# Patient Record
Sex: Female | Born: 1975 | Smoking: Never smoker
Health system: Southern US, Community
[De-identification: ages and names within clinical notes are randomized; demographics above are authoritative.]

## PROBLEM LIST (undated history)

## (undated) ENCOUNTER — Inpatient Hospital Stay (HOSPITAL_COMMUNITY): Payer: Self-pay

## (undated) DIAGNOSIS — Z9889 Other specified postprocedural states: Secondary | ICD-10-CM

## (undated) DIAGNOSIS — T4145XA Adverse effect of unspecified anesthetic, initial encounter: Secondary | ICD-10-CM

## (undated) DIAGNOSIS — F32A Depression, unspecified: Secondary | ICD-10-CM

## (undated) DIAGNOSIS — K921 Melena: Secondary | ICD-10-CM

## (undated) DIAGNOSIS — T8859XA Other complications of anesthesia, initial encounter: Secondary | ICD-10-CM

## (undated) DIAGNOSIS — D352 Benign neoplasm of pituitary gland: Secondary | ICD-10-CM

## (undated) DIAGNOSIS — G43909 Migraine, unspecified, not intractable, without status migrainosus: Secondary | ICD-10-CM

## (undated) DIAGNOSIS — F410 Panic disorder [episodic paroxysmal anxiety] without agoraphobia: Secondary | ICD-10-CM

## (undated) DIAGNOSIS — Z8719 Personal history of other diseases of the digestive system: Secondary | ICD-10-CM

## (undated) DIAGNOSIS — G4730 Sleep apnea, unspecified: Secondary | ICD-10-CM

## (undated) DIAGNOSIS — F329 Major depressive disorder, single episode, unspecified: Secondary | ICD-10-CM

## (undated) DIAGNOSIS — D126 Benign neoplasm of colon, unspecified: Secondary | ICD-10-CM

## (undated) DIAGNOSIS — E162 Hypoglycemia, unspecified: Secondary | ICD-10-CM

## (undated) DIAGNOSIS — K219 Gastro-esophageal reflux disease without esophagitis: Secondary | ICD-10-CM

## (undated) DIAGNOSIS — R112 Nausea with vomiting, unspecified: Secondary | ICD-10-CM

## (undated) DIAGNOSIS — M797 Fibromyalgia: Secondary | ICD-10-CM

## (undated) DIAGNOSIS — M1990 Unspecified osteoarthritis, unspecified site: Secondary | ICD-10-CM

## (undated) DIAGNOSIS — F419 Anxiety disorder, unspecified: Secondary | ICD-10-CM

## (undated) DIAGNOSIS — K589 Irritable bowel syndrome without diarrhea: Secondary | ICD-10-CM

## (undated) DIAGNOSIS — J302 Other seasonal allergic rhinitis: Secondary | ICD-10-CM

## (undated) DIAGNOSIS — M5430 Sciatica, unspecified side: Secondary | ICD-10-CM

## (undated) DIAGNOSIS — K648 Other hemorrhoids: Secondary | ICD-10-CM

## (undated) DIAGNOSIS — O2230 Deep phlebothrombosis in pregnancy, unspecified trimester: Secondary | ICD-10-CM

## (undated) DIAGNOSIS — D649 Anemia, unspecified: Secondary | ICD-10-CM

## (undated) DIAGNOSIS — D499 Neoplasm of unspecified behavior of unspecified site: Secondary | ICD-10-CM

## (undated) HISTORY — DX: Irritable bowel syndrome, unspecified: K58.9

## (undated) HISTORY — DX: Sciatica, unspecified side: M54.30

## (undated) HISTORY — PX: NASAL SINUS SURGERY: SHX719

## (undated) HISTORY — DX: Anxiety disorder, unspecified: F41.9

## (undated) HISTORY — DX: Depression, unspecified: F32.A

## (undated) HISTORY — DX: Melena: K92.1

## (undated) HISTORY — DX: Other seasonal allergic rhinitis: J30.2

## (undated) HISTORY — DX: Other hemorrhoids: K64.8

## (undated) HISTORY — DX: Migraine, unspecified, not intractable, without status migrainosus: G43.909

## (undated) HISTORY — DX: Major depressive disorder, single episode, unspecified: F32.9

## (undated) HISTORY — DX: Benign neoplasm of colon, unspecified: D12.6

## (undated) HISTORY — PX: TEMPOROMANDIBULAR JOINT SURGERY: SHX35

## (undated) HISTORY — DX: Panic disorder (episodic paroxysmal anxiety): F41.0

## (undated) HISTORY — DX: Neoplasm of unspecified behavior of unspecified site: D49.9

## (undated) HISTORY — DX: Gastro-esophageal reflux disease without esophagitis: K21.9

---

## 1997-10-22 DIAGNOSIS — D352 Benign neoplasm of pituitary gland: Secondary | ICD-10-CM

## 1997-10-22 HISTORY — PX: TRANSPHENOIDAL / TRANSNASAL HYPOPHYSECTOMY / RESECTION PITUITARY TUMOR: SUR1382

## 1997-10-22 HISTORY — DX: Benign neoplasm of pituitary gland: D35.2

## 1998-05-03 ENCOUNTER — Other Ambulatory Visit: Admission: RE | Admit: 1998-05-03 | Discharge: 1998-05-03 | Payer: Self-pay | Admitting: Dermatology

## 1998-05-05 ENCOUNTER — Other Ambulatory Visit: Admission: RE | Admit: 1998-05-05 | Discharge: 1998-05-05 | Payer: Self-pay | Admitting: Dermatology

## 1998-05-25 ENCOUNTER — Ambulatory Visit (HOSPITAL_COMMUNITY): Admission: AD | Admit: 1998-05-25 | Discharge: 1998-05-25 | Payer: Self-pay | Admitting: Obstetrics

## 1999-01-13 ENCOUNTER — Ambulatory Visit (HOSPITAL_COMMUNITY): Admission: RE | Admit: 1999-01-13 | Discharge: 1999-01-13 | Payer: Self-pay | Admitting: Anesthesiology

## 1999-02-16 ENCOUNTER — Encounter: Admission: RE | Admit: 1999-02-16 | Discharge: 1999-05-17 | Payer: Self-pay

## 2000-06-20 ENCOUNTER — Encounter: Admission: RE | Admit: 2000-06-20 | Discharge: 2000-06-20 | Payer: Self-pay | Admitting: Gastroenterology

## 2000-06-20 ENCOUNTER — Encounter: Payer: Self-pay | Admitting: Gastroenterology

## 2000-09-09 ENCOUNTER — Ambulatory Visit (HOSPITAL_COMMUNITY): Admission: RE | Admit: 2000-09-09 | Discharge: 2000-09-09 | Payer: Self-pay | Admitting: Neurosurgery

## 2001-03-04 ENCOUNTER — Encounter: Admission: RE | Admit: 2001-03-04 | Discharge: 2001-06-02 | Payer: Self-pay | Admitting: Family Medicine

## 2001-09-21 ENCOUNTER — Emergency Department (HOSPITAL_COMMUNITY): Admission: EM | Admit: 2001-09-21 | Discharge: 2001-09-21 | Payer: Self-pay | Admitting: Emergency Medicine

## 2001-09-21 ENCOUNTER — Encounter: Payer: Self-pay | Admitting: Emergency Medicine

## 2001-10-31 ENCOUNTER — Other Ambulatory Visit: Admission: RE | Admit: 2001-10-31 | Discharge: 2001-10-31 | Payer: Self-pay | Admitting: Obstetrics and Gynecology

## 2002-11-04 ENCOUNTER — Other Ambulatory Visit: Admission: RE | Admit: 2002-11-04 | Discharge: 2002-11-04 | Payer: Self-pay | Admitting: Obstetrics and Gynecology

## 2003-05-31 ENCOUNTER — Encounter: Admission: RE | Admit: 2003-05-31 | Discharge: 2003-08-29 | Payer: Self-pay | Admitting: Family Medicine

## 2004-01-31 ENCOUNTER — Other Ambulatory Visit: Admission: RE | Admit: 2004-01-31 | Discharge: 2004-01-31 | Payer: Self-pay | Admitting: Obstetrics and Gynecology

## 2004-02-25 ENCOUNTER — Ambulatory Visit (HOSPITAL_COMMUNITY): Admission: RE | Admit: 2004-02-25 | Discharge: 2004-02-25 | Payer: Self-pay | Admitting: Endocrinology

## 2004-03-27 ENCOUNTER — Encounter: Admission: RE | Admit: 2004-03-27 | Discharge: 2004-06-25 | Payer: Self-pay | Admitting: Anesthesiology

## 2004-04-04 ENCOUNTER — Encounter: Admission: RE | Admit: 2004-04-04 | Discharge: 2004-07-03 | Payer: Self-pay | Admitting: Family Medicine

## 2004-04-28 ENCOUNTER — Ambulatory Visit (HOSPITAL_COMMUNITY): Admission: RE | Admit: 2004-04-28 | Discharge: 2004-04-28 | Payer: Self-pay | Admitting: Urology

## 2004-04-28 ENCOUNTER — Ambulatory Visit (HOSPITAL_BASED_OUTPATIENT_CLINIC_OR_DEPARTMENT_OTHER): Admission: RE | Admit: 2004-04-28 | Discharge: 2004-04-28 | Payer: Self-pay | Admitting: Urology

## 2004-08-15 ENCOUNTER — Encounter: Admission: RE | Admit: 2004-08-15 | Discharge: 2004-10-25 | Payer: Self-pay | Admitting: Anesthesiology

## 2004-08-17 ENCOUNTER — Ambulatory Visit: Payer: Self-pay | Admitting: Physical Medicine & Rehabilitation

## 2004-09-18 ENCOUNTER — Ambulatory Visit: Payer: Self-pay

## 2004-09-29 ENCOUNTER — Ambulatory Visit: Payer: Self-pay | Admitting: Gastroenterology

## 2004-09-29 DIAGNOSIS — K449 Diaphragmatic hernia without obstruction or gangrene: Secondary | ICD-10-CM | POA: Insufficient documentation

## 2004-09-29 HISTORY — PX: ESOPHAGOGASTRODUODENOSCOPY: SHX1529

## 2004-10-25 ENCOUNTER — Encounter
Admission: RE | Admit: 2004-10-25 | Discharge: 2005-01-23 | Payer: Self-pay | Admitting: Physical Medicine & Rehabilitation

## 2004-10-27 ENCOUNTER — Ambulatory Visit: Payer: Self-pay | Admitting: Physical Medicine & Rehabilitation

## 2005-01-31 ENCOUNTER — Other Ambulatory Visit: Admission: RE | Admit: 2005-01-31 | Discharge: 2005-01-31 | Payer: Self-pay | Admitting: Obstetrics and Gynecology

## 2005-02-01 ENCOUNTER — Encounter: Admission: RE | Admit: 2005-02-01 | Discharge: 2005-04-27 | Payer: Self-pay | Admitting: Certified Nurse Midwife

## 2005-02-27 ENCOUNTER — Encounter
Admission: RE | Admit: 2005-02-27 | Discharge: 2005-05-28 | Payer: Self-pay | Admitting: Physical Medicine & Rehabilitation

## 2005-03-01 ENCOUNTER — Ambulatory Visit: Payer: Self-pay | Admitting: Physical Medicine & Rehabilitation

## 2005-05-24 ENCOUNTER — Ambulatory Visit: Payer: Self-pay | Admitting: Physical Medicine & Rehabilitation

## 2005-05-31 ENCOUNTER — Ambulatory Visit: Payer: Self-pay | Admitting: Physical Medicine & Rehabilitation

## 2005-05-31 ENCOUNTER — Encounter
Admission: RE | Admit: 2005-05-31 | Discharge: 2005-08-29 | Payer: Self-pay | Admitting: Physical Medicine & Rehabilitation

## 2005-07-01 ENCOUNTER — Ambulatory Visit (HOSPITAL_BASED_OUTPATIENT_CLINIC_OR_DEPARTMENT_OTHER)
Admission: RE | Admit: 2005-07-01 | Discharge: 2005-07-01 | Payer: Self-pay | Admitting: Physical Medicine & Rehabilitation

## 2005-07-01 ENCOUNTER — Ambulatory Visit: Payer: Self-pay | Admitting: Internal Medicine

## 2005-07-12 ENCOUNTER — Ambulatory Visit: Payer: Self-pay | Admitting: Physical Medicine & Rehabilitation

## 2005-08-31 ENCOUNTER — Encounter
Admission: RE | Admit: 2005-08-31 | Discharge: 2005-11-29 | Payer: Self-pay | Admitting: Physical Medicine & Rehabilitation

## 2005-08-31 ENCOUNTER — Ambulatory Visit: Payer: Self-pay | Admitting: Physical Medicine & Rehabilitation

## 2005-10-11 ENCOUNTER — Ambulatory Visit: Payer: Self-pay | Admitting: Physical Medicine & Rehabilitation

## 2005-11-13 ENCOUNTER — Ambulatory Visit: Payer: Self-pay | Admitting: Physical Medicine & Rehabilitation

## 2005-11-18 ENCOUNTER — Encounter: Admission: RE | Admit: 2005-11-18 | Discharge: 2005-11-18 | Payer: Self-pay | Admitting: Family Medicine

## 2005-12-18 ENCOUNTER — Ambulatory Visit: Payer: Self-pay | Admitting: Physical Medicine & Rehabilitation

## 2005-12-18 ENCOUNTER — Encounter
Admission: RE | Admit: 2005-12-18 | Discharge: 2006-03-18 | Payer: Self-pay | Admitting: Physical Medicine & Rehabilitation

## 2006-02-06 ENCOUNTER — Other Ambulatory Visit: Admission: RE | Admit: 2006-02-06 | Discharge: 2006-02-06 | Payer: Self-pay | Admitting: Obstetrics and Gynecology

## 2006-02-12 ENCOUNTER — Ambulatory Visit: Payer: Self-pay | Admitting: Physical Medicine & Rehabilitation

## 2006-04-12 ENCOUNTER — Encounter
Admission: RE | Admit: 2006-04-12 | Discharge: 2006-07-11 | Payer: Self-pay | Admitting: Physical Medicine & Rehabilitation

## 2006-05-17 ENCOUNTER — Ambulatory Visit: Payer: Self-pay | Admitting: Physical Medicine & Rehabilitation

## 2006-05-24 ENCOUNTER — Encounter: Admission: RE | Admit: 2006-05-24 | Discharge: 2006-05-24 | Payer: Self-pay | Admitting: Family Medicine

## 2006-07-18 ENCOUNTER — Ambulatory Visit: Payer: Self-pay | Admitting: Physical Medicine & Rehabilitation

## 2006-07-18 ENCOUNTER — Encounter
Admission: RE | Admit: 2006-07-18 | Discharge: 2006-10-16 | Payer: Self-pay | Admitting: Physical Medicine & Rehabilitation

## 2006-07-22 ENCOUNTER — Encounter: Admission: RE | Admit: 2006-07-22 | Discharge: 2006-07-22 | Payer: Self-pay | Admitting: Family Medicine

## 2006-09-09 ENCOUNTER — Ambulatory Visit: Payer: Self-pay | Admitting: Physical Medicine & Rehabilitation

## 2006-10-22 DIAGNOSIS — D126 Benign neoplasm of colon, unspecified: Secondary | ICD-10-CM

## 2006-10-22 HISTORY — PX: UTERINE FIBROID SURGERY: SHX826

## 2006-10-22 HISTORY — DX: Benign neoplasm of colon, unspecified: D12.6

## 2006-11-05 ENCOUNTER — Ambulatory Visit: Payer: Self-pay | Admitting: Physical Medicine & Rehabilitation

## 2006-11-05 ENCOUNTER — Encounter
Admission: RE | Admit: 2006-11-05 | Discharge: 2007-02-03 | Payer: Self-pay | Admitting: Physical Medicine & Rehabilitation

## 2006-12-18 ENCOUNTER — Ambulatory Visit: Payer: Self-pay | Admitting: Internal Medicine

## 2006-12-18 LAB — CONVERTED CEMR LAB
ALT: 21 units/L (ref 0–40)
AST: 22 units/L (ref 0–37)
Albumin: 4 g/dL (ref 3.5–5.2)
Alkaline Phosphatase: 30 units/L — ABNORMAL LOW (ref 39–117)
Basophils Relative: 0.5 % (ref 0.0–1.0)
CO2: 28 meq/L (ref 19–32)
Calcium: 9.3 mg/dL (ref 8.4–10.5)
Creatinine, Ser: 0.6 mg/dL (ref 0.4–1.2)
Eosinophils Absolute: 0.2 10*3/uL (ref 0.0–0.6)
Ferritin: 11.8 ng/mL (ref 10.0–291.0)
GFR calc non Af Amer: 124 mL/min
Glucose, Bld: 89 mg/dL (ref 70–99)
Hemoglobin: 13.1 g/dL (ref 12.0–15.0)
IgA: 129 mg/dL (ref 68–378)
Neutrophils Relative %: 56.4 % (ref 43.0–77.0)
Platelets: 294 10*3/uL (ref 150–400)
RBC: 4.35 M/uL (ref 3.87–5.11)
Sodium: 141 meq/L (ref 135–145)
Total Bilirubin: 1 mg/dL (ref 0.3–1.2)
Total Protein: 7.3 g/dL (ref 6.0–8.3)
Vitamin B-12: 747 pg/mL (ref 211–911)

## 2007-01-03 ENCOUNTER — Encounter (INDEPENDENT_AMBULATORY_CARE_PROVIDER_SITE_OTHER): Payer: Self-pay | Admitting: Specialist

## 2007-01-03 ENCOUNTER — Ambulatory Visit: Payer: Self-pay | Admitting: Internal Medicine

## 2007-01-03 DIAGNOSIS — K648 Other hemorrhoids: Secondary | ICD-10-CM | POA: Insufficient documentation

## 2007-01-03 DIAGNOSIS — D499 Neoplasm of unspecified behavior of unspecified site: Secondary | ICD-10-CM

## 2007-01-03 HISTORY — DX: Neoplasm of unspecified behavior of unspecified site: D49.9

## 2007-01-03 HISTORY — PX: COLONOSCOPY: SHX174

## 2007-01-08 ENCOUNTER — Ambulatory Visit (HOSPITAL_COMMUNITY): Admission: RE | Admit: 2007-01-08 | Discharge: 2007-01-08 | Payer: Self-pay | Admitting: Obstetrics and Gynecology

## 2007-01-21 ENCOUNTER — Ambulatory Visit: Payer: Self-pay | Admitting: Internal Medicine

## 2007-01-24 ENCOUNTER — Ambulatory Visit: Payer: Self-pay | Admitting: Physical Medicine & Rehabilitation

## 2007-02-26 ENCOUNTER — Emergency Department (HOSPITAL_COMMUNITY): Admission: EM | Admit: 2007-02-26 | Discharge: 2007-02-26 | Payer: Self-pay | Admitting: Emergency Medicine

## 2007-04-15 ENCOUNTER — Encounter: Payer: Self-pay | Admitting: Internal Medicine

## 2007-04-22 ENCOUNTER — Encounter
Admission: RE | Admit: 2007-04-22 | Discharge: 2007-07-21 | Payer: Self-pay | Admitting: Physical Medicine & Rehabilitation

## 2007-04-29 ENCOUNTER — Ambulatory Visit: Payer: Self-pay | Admitting: Physical Medicine & Rehabilitation

## 2007-06-06 ENCOUNTER — Ambulatory Visit: Payer: Self-pay | Admitting: Physical Medicine & Rehabilitation

## 2007-09-05 ENCOUNTER — Encounter
Admission: RE | Admit: 2007-09-05 | Discharge: 2007-09-05 | Payer: Self-pay | Admitting: Physical Medicine & Rehabilitation

## 2007-09-05 ENCOUNTER — Ambulatory Visit: Payer: Self-pay | Admitting: Physical Medicine & Rehabilitation

## 2007-10-07 ENCOUNTER — Ambulatory Visit: Payer: Self-pay | Admitting: Physical Medicine & Rehabilitation

## 2007-11-06 ENCOUNTER — Encounter
Admission: RE | Admit: 2007-11-06 | Discharge: 2007-11-07 | Payer: Self-pay | Admitting: Physical Medicine & Rehabilitation

## 2008-05-05 ENCOUNTER — Encounter: Payer: Self-pay | Admitting: Internal Medicine

## 2008-05-20 ENCOUNTER — Inpatient Hospital Stay (HOSPITAL_COMMUNITY): Admission: AD | Admit: 2008-05-20 | Discharge: 2008-05-22 | Payer: Self-pay | Admitting: Obstetrics and Gynecology

## 2008-05-21 ENCOUNTER — Encounter: Payer: Self-pay | Admitting: Obstetrics and Gynecology

## 2008-07-17 ENCOUNTER — Inpatient Hospital Stay (HOSPITAL_COMMUNITY): Admission: AD | Admit: 2008-07-17 | Discharge: 2008-07-17 | Payer: Self-pay | Admitting: Obstetrics and Gynecology

## 2008-07-25 ENCOUNTER — Inpatient Hospital Stay (HOSPITAL_COMMUNITY): Admission: AD | Admit: 2008-07-25 | Discharge: 2008-07-28 | Payer: Self-pay | Admitting: Obstetrics and Gynecology

## 2008-09-20 ENCOUNTER — Encounter
Admission: RE | Admit: 2008-09-20 | Discharge: 2008-09-21 | Payer: Self-pay | Admitting: Physical Medicine & Rehabilitation

## 2008-09-21 ENCOUNTER — Ambulatory Visit: Payer: Self-pay | Admitting: Physical Medicine & Rehabilitation

## 2008-11-08 ENCOUNTER — Telehealth: Payer: Self-pay | Admitting: Internal Medicine

## 2008-12-13 ENCOUNTER — Telehealth: Payer: Self-pay | Admitting: Internal Medicine

## 2008-12-14 ENCOUNTER — Ambulatory Visit: Payer: Self-pay | Admitting: Internal Medicine

## 2008-12-14 ENCOUNTER — Encounter: Payer: Self-pay | Admitting: Physician Assistant

## 2008-12-14 DIAGNOSIS — Z860100 Personal history of colon polyps, unspecified: Secondary | ICD-10-CM | POA: Insufficient documentation

## 2008-12-14 LAB — CONVERTED CEMR LAB
Basophils Absolute: 0 10*3/uL (ref 0.0–0.1)
Basophils Relative: 0.7 % (ref 0.0–3.0)
Eosinophils Absolute: 0.7 10*3/uL (ref 0.0–0.7)
HCT: 38.7 % (ref 36.0–46.0)
Lymphocytes Relative: 28.3 % (ref 12.0–46.0)
Monocytes Absolute: 0.5 10*3/uL (ref 0.1–1.0)
Platelets: 289 10*3/uL (ref 150–400)
WBC: 5.6 10*3/uL (ref 4.5–10.5)

## 2008-12-17 ENCOUNTER — Encounter
Admission: RE | Admit: 2008-12-17 | Discharge: 2009-01-03 | Payer: Self-pay | Admitting: Physical Medicine & Rehabilitation

## 2008-12-22 ENCOUNTER — Telehealth: Payer: Self-pay | Admitting: Physician Assistant

## 2009-01-03 ENCOUNTER — Ambulatory Visit: Payer: Self-pay | Admitting: Physical Medicine & Rehabilitation

## 2009-01-19 ENCOUNTER — Encounter: Payer: Self-pay | Admitting: Internal Medicine

## 2009-01-27 ENCOUNTER — Telehealth: Payer: Self-pay | Admitting: Internal Medicine

## 2009-01-31 ENCOUNTER — Ambulatory Visit: Payer: Self-pay | Admitting: Internal Medicine

## 2009-01-31 ENCOUNTER — Telehealth: Payer: Self-pay | Admitting: Physician Assistant

## 2009-01-31 DIAGNOSIS — K219 Gastro-esophageal reflux disease without esophagitis: Secondary | ICD-10-CM

## 2009-01-31 DIAGNOSIS — R1013 Epigastric pain: Secondary | ICD-10-CM

## 2009-02-02 ENCOUNTER — Telehealth: Payer: Self-pay | Admitting: Internal Medicine

## 2009-02-02 ENCOUNTER — Telehealth: Payer: Self-pay | Admitting: Physician Assistant

## 2009-02-03 ENCOUNTER — Telehealth: Payer: Self-pay | Admitting: Internal Medicine

## 2009-02-07 ENCOUNTER — Telehealth: Payer: Self-pay | Admitting: Internal Medicine

## 2009-02-07 ENCOUNTER — Encounter: Admission: RE | Admit: 2009-02-07 | Discharge: 2009-02-07 | Payer: Self-pay | Admitting: Internal Medicine

## 2009-02-08 ENCOUNTER — Ambulatory Visit: Payer: Self-pay | Admitting: Internal Medicine

## 2009-02-09 ENCOUNTER — Telehealth: Payer: Self-pay | Admitting: Internal Medicine

## 2009-02-14 ENCOUNTER — Telehealth: Payer: Self-pay | Admitting: Physician Assistant

## 2009-08-05 ENCOUNTER — Inpatient Hospital Stay (HOSPITAL_COMMUNITY): Admission: AD | Admit: 2009-08-05 | Discharge: 2009-08-05 | Payer: Self-pay | Admitting: Obstetrics and Gynecology

## 2009-08-05 ENCOUNTER — Ambulatory Visit: Payer: Self-pay | Admitting: Obstetrics and Gynecology

## 2009-08-08 ENCOUNTER — Telehealth: Payer: Self-pay | Admitting: Internal Medicine

## 2009-08-09 ENCOUNTER — Telehealth (INDEPENDENT_AMBULATORY_CARE_PROVIDER_SITE_OTHER): Payer: Self-pay | Admitting: *Deleted

## 2009-10-02 ENCOUNTER — Inpatient Hospital Stay (HOSPITAL_COMMUNITY): Admission: AD | Admit: 2009-10-02 | Discharge: 2009-10-04 | Payer: Self-pay | Admitting: Obstetrics and Gynecology

## 2009-11-14 ENCOUNTER — Telehealth: Payer: Self-pay | Admitting: Internal Medicine

## 2009-11-30 ENCOUNTER — Ambulatory Visit: Payer: Self-pay | Admitting: Internal Medicine

## 2009-11-30 DIAGNOSIS — R932 Abnormal findings on diagnostic imaging of liver and biliary tract: Secondary | ICD-10-CM

## 2009-12-04 DIAGNOSIS — K589 Irritable bowel syndrome without diarrhea: Secondary | ICD-10-CM

## 2009-12-08 ENCOUNTER — Encounter
Admission: RE | Admit: 2009-12-08 | Discharge: 2009-12-13 | Payer: Self-pay | Admitting: Physical Medicine & Rehabilitation

## 2009-12-13 ENCOUNTER — Ambulatory Visit: Payer: Self-pay | Admitting: Physical Medicine & Rehabilitation

## 2009-12-16 ENCOUNTER — Encounter: Admission: RE | Admit: 2009-12-16 | Discharge: 2009-12-16 | Payer: Self-pay | Admitting: Internal Medicine

## 2010-01-12 ENCOUNTER — Telehealth: Payer: Self-pay | Admitting: Internal Medicine

## 2010-02-02 ENCOUNTER — Ambulatory Visit: Payer: Self-pay | Admitting: Internal Medicine

## 2010-02-22 ENCOUNTER — Encounter: Payer: Self-pay | Admitting: Internal Medicine

## 2010-03-31 ENCOUNTER — Emergency Department (HOSPITAL_COMMUNITY): Admission: EM | Admit: 2010-03-31 | Discharge: 2010-03-31 | Payer: Self-pay | Admitting: Emergency Medicine

## 2010-03-31 ENCOUNTER — Encounter: Payer: Self-pay | Admitting: Internal Medicine

## 2010-03-31 ENCOUNTER — Telehealth: Payer: Self-pay | Admitting: Internal Medicine

## 2010-05-02 ENCOUNTER — Ambulatory Visit: Payer: Self-pay | Admitting: Internal Medicine

## 2010-05-02 DIAGNOSIS — R198 Other specified symptoms and signs involving the digestive system and abdomen: Secondary | ICD-10-CM

## 2010-05-03 ENCOUNTER — Encounter: Payer: Self-pay | Admitting: Internal Medicine

## 2010-05-04 DIAGNOSIS — Z91012 Allergy to eggs: Secondary | ICD-10-CM

## 2010-06-19 ENCOUNTER — Encounter
Admission: RE | Admit: 2010-06-19 | Discharge: 2010-09-17 | Payer: Self-pay | Source: Home / Self Care | Admitting: Physical Medicine & Rehabilitation

## 2010-08-23 ENCOUNTER — Ambulatory Visit: Payer: Self-pay | Admitting: Physical Medicine & Rehabilitation

## 2010-11-12 ENCOUNTER — Encounter: Payer: Self-pay | Admitting: Internal Medicine

## 2010-11-21 NOTE — Progress Notes (Signed)
Summary: triage   Phone Note Call from Patient Call back at Home Phone [phone]   Caller: Patient Call For: Dr. Leone Payor Reason for Call: Talk to Nurse Summary of Call: pt doesnt think her March appt is soon enough and would like to see Amy or Gunnar Fusi asap Initial call taken by: Vallarie Mare,  November 14, 2009 3:27 PM  Follow-up for Phone Call        patient with abdominal pain and cramping.  Patient  has recently had a baby delivered last week.  Patient  has antispasmodics Robinol previously perscribed.  Patient  is breast feeding, she is advised she needs to speak with her GYN or baby's perdiatrician for ok to take while breast feeding.  REV rescheduled with Dr Leone Payor to 11-30-09 2:00 Follow-up by: Darcey Nora RN, CGRN,  November 14, 2009 4:01 PM

## 2010-11-21 NOTE — Progress Notes (Signed)
Summary: [place] results  Phone Note Call from Patient Call back at Home Phone [phone]   Caller: Patient Call For: Dr Leone Payor Reason for Call: Lab or Test Results Details for Reason: [place] results Summary of Call: Pt asked about [place] results during procedure reminder call; request a callback; thanks Initial call taken by: Dwan Bolt,  February 07, 2009 1:01 PM  Follow-up for Phone Call        [facility] to pt and advised we do not have results yet.  Advised pt that Dr. Leone Payor would review them with her tomorrow at her procedure.  Pt voices understanding. Follow-up by: Francee Piccolo CMA,  February 07, 2009 1:56 PM

## 2010-11-21 NOTE — Consult Note (Signed)
Summary: [facility]   Imported By: Sherian Rein 12/07/2009 07:14:31  _____________________________________________________________________  External Attachment:    Type:   Image     Comment:   External Document

## 2010-11-21 NOTE — Assessment & Plan Note (Signed)
Summary: IBS SYMPTOMS/CRAMPING...AS.    History of Present Illness Visit Type: Follow-up Visit Primary GI MD: Stan Head MD [facility] Primary Provider: n/a Requesting Provider: n/a Chief Complaint: abdominal pain History of Present Illness:   35 yo white woman with IBS and GERD symptoms. Last seen 01/2009, EGD - gastrtitis and [place] ? of Gallbladder polyps.  She is having similar symptoms as she did after first child. Then she had flu shot and ?ed if that was related as she had pain with sharp cramps in epigastrium lasting for hrs. Typically with eggs.  Took months before she eat eggs again. Does have some radiation into the back. Now similar issues. Other foods that will do itare greasy, fried foods and sometimes sweets. Has tried avoid gluten with mixed results,  Bowels still alternating. Someties has loose stools after or during attacks           Current Medications (verified): 1)  Analpram-Hc 1-2.5 % Crea (Hydrocortisone Ace-Pramoxine) .... Use Rectally As Needed 2)  Multivitamins   Tabs (Multiple Vitamin) .Marland Kitchen.. 1 Once Daily 3)  Align  Caps (Misc Intestinal Flora Regulat) .Marland Kitchen.. 1 Once Daily 4)  Protonix 40 Mg  Tbec (Pantoprazole Sodium) .Marland Kitchen.. 1 By Mouth Two Times A Day 5)  Calcium 500 Mg Tabs (Calcium) .... Once Daily 6)  Vitamin D3 1000 Unit Tabs (Cholecalciferol) .... Once Daily  Allergies (verified): No Known Drug Allergies  Past History:  Past Medical History: Irritable Bowel Syndrome GERD  Depression. Chronic back pain with sciatica.  Anxiety and panic disorder, 1999 and 2000.  Uterine Fibroid(s) Rectal adenoma (5mm) 2008  Past Surgical History: Myoectomy 2008 Benign pituitary tumor resection 1999  Family History: Reviewed history from 12/14/2008 and no changes required. Family History of Prostate Cancer:Father Thyroid Cancer: Sister Family History of Diabetes: Paternal & Maternal Family History of Heart Disease:   Social History: Occupation:  Housewife Single, 2 children Patient has never smoked.  Alcohol Use - no Illicit Drug Use - no Patient gets regular exercise.  Review of Systems       breast feeding  Vital Signs:  Patient profile:   35 year old female Height:      66 inches Weight:      149 pounds BMI:     24.14 Pulse rate:   84 / minute Pulse rhythm:   regular BP sitting:   102 / 60  (left arm) Cuff size:   regular  Vitals Entered By: June McMurray CMA Duncan Dull) (November 30, 2009 2:03 PM)  Physical Exam  General:  Well developed, well nourished, no acute distress. Eyes:  no icterus. Lungs:  Clear throughout to auscultation. Heart:  Regular rate and rhythm; no murmurs, rubs,  or bruits. Abdomen:  Soft, nontender and nondistended. No masses, hepatosplenomegaly or hernias noted. Normal bowel sounds. Psych:  Alert and cooperative. Normal mood and affect.   Impression & Recommendations:  Problem # 1:  ABDOMINAL PAIN-EPIGASTRIC (ICD-789.06) Assessment Deteriorated Recurrent issue for her. Maybe she does have gallstones given the intermittent nature and triggered by food. Frequently caused by eggs but not always. ? of food allergies was raised but this did resolve at one point and was able to eat eggs, etc so doubt allergies doubt peptic, no heartburn on PPI  Orders: MRI Liver (MRI Liver) This will be more accurate way to examine possible gallbladder abnormalities seen at [place], and look for stones.  Problem # 2:  NONSPECIFIC ABN FINDNG RAD&OTH EXAM BILARY TRCT (ICD-793.3) Assessment: Unchanged ? gallbladder polyps  but sxs raise ? of gallstones and ? hemangioma on prior CT and [place]  Orders: MRI Liver (MRI Liver)  Problem # 3:  IRRITABLE BOWEL SYNDROME (ICD-564.1) Assessment: Unchanged Her symptoms could be part of this also.   Patient Instructions: 1)  Your MRI is scheduled at [facility] for Sunday, 12/04/09.  See seperate instructions. 2)  We will call you with follow up after we have these  results. 3)  The medication list was reviewed and reconciled.  All changed / newly prescribed medications were explained.  A complete medication list was provided to the patient / caregiver.

## 2010-11-21 NOTE — Progress Notes (Signed)
Summary: abd pain   Phone Note Call from Patient Call back at Home Phone [phone]   Caller: Patient Call For: Dr. Leone Payor Reason for Call: Talk to Nurse Summary of Call: having attack of upper abd pain/cramping, diarrhea, and extreme nausea Initial call taken by: Vallarie Mare,  March 31, 2010 2:22 PM  Follow-up for Phone Call        Ate salad bar and seasoned potatoes  at [employer] about an hr. ago and within a half hr. began having epigastric pain,nausea and loose brown stool x3.No shaking chills.Is breastfeeding and said baby is 24 months old so she will give her a bottle if necessary but has no meds. on hand. Follow-up by: Teryl Lucy RN,  March 31, 2010 2:45 PM  Additional Follow-up for Phone Call Additional follow up Details #1::        pt called back again and is in alot of pain. Needs to know what she needs to do or she is going to go to ED Additional Follow-up by: Karna Christmas,  March 31, 2010 4:27 PM    Additional Follow-up for Phone Call Additional follow up Details #2::    Discussed pt's symptoms with Dr.Gessner. per his recomendation he does not feel evaluation not appropriate in office at this time.Pt. will have to use her discretion as to ER.evaluation.Pt. became argumentative saying she wanted a med . ordered to keep on hand for her well-documented IBS. and that she is going to come to office immediately for diagnosis and tx.by DrGessner. Given ROV appt. in July per her request.I re-iterated DrGessner's recommendations. Follow-up by: Teryl Lucy RN,  March 31, 2010 4:57 PM  Additional Follow-up for Phone Call Additional follow up Details #3:: Details for Additional Follow-up Action Taken: as per above I disussed with Elnita Maxwell and made above recommendations she has declined to take other IBS medications in the past due to breast-feeding concerns and I was unable to make a rapid decison to prescribe one. we will recontact her re: she can ask her OB/GYN or pediatrician  as to what anti-spasmodic medications are safe in breast feeding. She has not taken recommended medications in the past due to concerns about breast-feeding.Examples of possible anti-spasmodics would be dicyclomine, hyoscyamine. Additional Follow-up by: Iva Boop MD, [name],  March 31, 2010 5:10 PM   Appended Document: abd pain I called and spoke with the patient this am , she is given the names of anti-spasmodics to check with her GYN/pediatrician for safety with breastfeding.   Patient  was seen at Urgent Care and they recommended she be moved to thet ER for a CT scan after she had labs done.  She had an elevated white count and they recommended a CT scan to r/o appendicitis.  patient left the ER AMA because they didn't see her immediately.  Patient  didn't have CT scan, she is wondering if you will order it now.  She has no abdominal pain this am and feels fine.  I have copied ER note into EMR please advise  Appended Document: abd pain with pain gone would not do CT This is most consistent with her IBS and possible food allergies. Await review of suggest antispasmodicas as above  Appended Document: abd pain I have left her a message with Dr Marvell Fuller recommendations

## 2010-11-21 NOTE — Assessment & Plan Note (Signed)
Summary: f/u IBS/cl    History of Present Illness Visit Type: Follow-up Visit Primary GI MD: Stan Head MD [facility] Primary Provider: n/a Requesting Provider: n/a Chief Complaint: IBS follow up History of Present Illness:   35 yo woman with IBS. Recent acute on chronic abdominal pain.She has been to [facility] ER for abdominal pain she had labs and an xray. She states that she is still having some moderate stomach discomfort. That workup is reviewed and discussed with patient, it was unrevealing. She left AMA prior to CT scan to assess for appendicitis, so she could get home to breast feed her child.  That pain is essentially resolved.  She thinks Creon may be helping but is avoiding eggs also. She has been to Dr. Jethro Bolus re: egg allergies and other possible food allergies. records reviewed.   GI Review of Systems    Reports abdominal pain.     Location of  Abdominal pain: generalized.    Denies acid reflux, belching, bloating, chest pain, dysphagia with liquids, dysphagia with solids, heartburn, loss of appetite, nausea, vomiting, vomiting blood, weight loss, and  weight gain.        Denies anal fissure, black tarry stools, change in bowel habit, constipation, diarrhea, diverticulosis, fecal incontinence, heme positive stool, hemorrhoids, irritable bowel syndrome, jaundice, light color stool, liver problems, rectal bleeding, and  rectal pain.    Current Medications (verified): 1)  Multivitamins   Tabs (Multiple Vitamin) .Marland Kitchen.. 1 Once Daily 2)  Pure Encapsule Probiotic .... Take One By Mouth Two Times A Day 3)  Protonix 40 Mg  Tbec (Pantoprazole Sodium) .Marland Kitchen.. 1 By Mouth Two Times A Day 4)  Calcium 500 Mg Tabs (Calcium) .... Once Daily 5)  Vitamin D3 1000 Unit Tabs (Cholecalciferol) .... Take 3 Tablets By Mouth Once Daily 6)  Creon 12000 Unit Cpep (Pancrelipase (Lip-Prot-Amyl)) .Marland Kitchen.. 1 By Mouth Ac Meals  Allergies (verified): No Known Drug Allergies  Past History:  Past Medical  History: Reviewed history from 11/30/2009 and no changes required. Irritable Bowel Syndrome GERD  Depression. Chronic back pain with sciatica.  Anxiety and panic disorder, 1999 and 2000.  Uterine Fibroid(s) Rectal adenoma (5mm) 2008  Past Surgical History: Reviewed history from 11/30/2009 and no changes required. Myoectomy 2008 Benign pituitary tumor resection 1999  Family History: Reviewed history from 02/02/2010 and no changes required. Family History of Prostate Cancer:Father Thyroid Cancer: Sister Family History of Diabetes: Paternal & Maternal Family Family History of Heart Disease: Grandmother, Mother  Social History: Reviewed history from 02/02/2010 and no changes required. 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  Illicit Drug Use - no Patient gets regular exercise. Alcohol Use - yes-occasional  Vital Signs:  Patient profile:   35 year old female Height:      66 inches Weight:      148.0 pounds BMI:     23.97 Pulse rate:   89 / minute Pulse rhythm:   regular BP sitting:   90 / 58  (left arm) Cuff size:   regular  Vitals Entered By: Harlow Mares CMA Duncan Dull) (May 02, 2010 9:49 AM)  Physical Exam  General:  Well developed, well nourished, no acute distress. Abdomen:  Soft, nontender and nondistended. No masses, hepatosplenomegaly or hernias noted. Normal bowel sounds.   Impression & Recommendations:  Problem # 1:  IRRITABLE BOWEL SYNDROME (ICD-564.1) Assessment Deteriorated We discussed that she has IBS and it will cause pain in abdomen. She may develop other problems and it can be difficult  to distingusih IBS from those. She has avoided medications due to fears of transmitting medications to her child through breast milk. I have recommended a referral to [facility]. She is willing but would like all possible testing possible here completed before that occurs. However, while waiting for her instrctions and copies  of ED visit documentation that we provided, she pressed me on how long it would take to have an appointment at [facility]. I re-explained we would await the stool studies listed below prior to referring, and also see if dicyclomine would be helpful.  She has a history of mental illness, and ? what effects/role that may have with her IBS. A multidisciplinary approach will be helpful I suspect.  15 mis time or more spent with her today  Orders: T-Fecal WBC ([id number]) T- * Misc. Laboratory test [phone])  Patient Instructions: 1)  Please pick up your medications at your pharmacy. 2)  Your physician has requested that you have the following labwork done today: fecal calprotectin and lactoferrin 3)  We will notify re: results and plans 4)  The medication list was reviewed and reconciled.  All changed / newly prescribed medications were explained.  A complete medication list was provided to the patient / caregiver. Prescriptions: DICYCLOMINE HCL 10 MG CAPS (DICYCLOMINE HCL) 1-2 by mouth every 6 hrs as needed for abdominal pain  #60 x 2   Entered and Authorized by:   Iva Boop MD, [facility]   Signed by:   Iva Boop MD, FACG on 05/02/2010   Method used:   Electronically to        [facility]  [phone]* (retail)       [address] [place], [place]  [address]       Ph: [phone] or [phone]       Fax: [phone]   RxID:   [id number]   Appended Document: f/u IBS/cl    Impression & Recommendations:  Problem # 1:  PERSONAL HISTORY OF ALLERGY TO EGGS (ICD-V15.03) Assessment Unchanged Scratch test + at [facility] This may be part of her problem but certainly not all.  cc: Jethro Bolus, MD

## 2010-11-21 NOTE — Medication Information (Signed)
Summary: Approved/[facility]  Approved/[facility]   Imported By: Lester Bellview 02/08/2010 08:24:58  _____________________________________________________________________  External Attachment:    Type:   Image     Comment:   External Document

## 2010-11-21 NOTE — Letter (Signed)
Summary: [facility]  [facility]   Imported By: Sherian Rein 03/29/2010 09:48:01  _____________________________________________________________________  External Attachment:    Type:   Image     Comment:   External Document

## 2010-11-21 NOTE — Progress Notes (Signed)
Summary: Ret your call from 12-26-09   Phone Note Call from Patient Call back at Home Phone [phone]   Call For: DR [facility] Summary of Call: Returning your call from 12-26-09 Initial call taken by: Leanor Kail [facility],  January 12, 2010 11:53 AM  Follow-up for Phone Call        Patient c/o diarrhea and black stools.  She does report she hasn't started on creon and she has taken doses of Pepto.  I have asked her to start on Creon as ordered and explained that Pepto will cause black stools.  She will try creon and call back if no improvement next week. Follow-up by: Darcey Nora RN, CGRN,  January 12, 2010 1:30 PM

## 2010-11-21 NOTE — Assessment & Plan Note (Signed)
Summary: follow up MRI/sheri    History of Present Illness Visit Type: Follow-up Visit Primary GI MD: Stan Head MD [facility] Primary Provider: n/a Requesting Provider: n/a Chief Complaint: f/u MRI. Still feels the same. History of Present Illness:   Pain is overall better but when she introduces foods with eggs she has the same abdominal pain problems as before. Heartburn generally controlled on PPI. She has not yet tried [place] that was suggested for bloating and abdominal pain complaints as an empiric trial.   GI Review of Systems    Reports abdominal pain, acid reflux, belching, bloating, and  heartburn.     Location of  Abdominal pain: upper abdomen.    Denies chest pain, dysphagia with liquids, dysphagia with solids, loss of appetite, nausea, vomiting, vomiting blood, weight loss, and  weight gain.      Reports change in bowel habits, diverticulosis, and  irritable bowel syndrome.     Denies anal fissure, black tarry stools, constipation, diarrhea, fecal incontinence, heme positive stool, hemorrhoids, jaundice, light color stool, liver problems, rectal bleeding, and  rectal pain.    Current Medications (verified): 1)  Analpram-Hc 1-2.5 % Crea (Hydrocortisone Ace-Pramoxine) .... Use Rectally As Needed 2)  Multivitamins   Tabs (Multiple Vitamin) .Marland Kitchen.. 1 Once Daily 3)  Align  Caps (Misc Intestinal Flora Regulat) .Marland Kitchen.. 1 Once Daily 4)  Protonix 40 Mg  Tbec (Pantoprazole Sodium) .Marland Kitchen.. 1 By Mouth Two Times A Day 5)  Calcium 500 Mg Tabs (Calcium) .... Once Daily 6)  Vitamin D3 1000 Unit Tabs (Cholecalciferol) .... Take 3 Tablets By Mouth Once Daily 7)  Creon 12000 Unit Cpep (Pancrelipase (Lip-Prot-Amyl)) .Marland Kitchen.. 1 By Mouth Ac Meals Has Not Started Yet!!!!!  Allergies (verified): No Known Drug Allergies  Past History:  Past Medical History: Reviewed history from 11/30/2009 and no changes required. Irritable Bowel Syndrome GERD  Depression. Chronic back pain with sciatica.  Anxiety  and panic disorder, 1999 and 2000.  Uterine Fibroid(s) Rectal adenoma (5mm) 2008  Past Surgical History: Reviewed history from 11/30/2009 and no changes required. 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  Illicit Drug Use - no Patient gets regular exercise. Alcohol Use - yes-occasional  Vital Signs:  Patient profile:   35 year old female Height:      66 inches Weight:      150.13 pounds BMI:     24.32 BSA:     1.77 Pulse rate:   88 / minute Pulse rhythm:   regular BP sitting:   92 / 60  (left arm)  Vitals Entered By: Hortense Ramal CMA Duncan Dull) (February 02, 2010 11:37 AM)   Impression & Recommendations:  Problem # 1:  GERD (ICD-530.81) Assessment Unchanged  continue PPI  Orders: Allergy Referral  (Allergy)  Problem # 2:  IRRITABLE BOWEL SYNDROME (ICD-564.1) Assessment: Unchanged will try the Creon 1 sample bottle gven  Problem # 3:  ABDOMINAL PAIN-EPIGASTRIC (ICD-789.06) Assessment: Unchanged  suspect functional cause MR abdomen unrevealing there are food intolerances ad ? allergies - will have allergy evaluation - she has seen Dr. Stevphen Rochester in past for non-food allergies.  Orders: Allergy Referral  (Allergy)  Patient Instructions: 1)  We will obtain an allergy appointment re: possible food allergies. 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 Aciphex  20 mg two times a day until Protonix approved. 3)  Copy sent to : Stevphen Rochester, MD 4)  The medication list was reviewed and reconciled.  All changed / newly prescribed medications were explained.  A complete medication list was provided to the patient / caregiver. Prescriptions: PROTONIX 40 MG  TBEC (PANTOPRAZOLE SODIUM)  1 by mouth two times a day  #60 x 5   Entered and Authorized by:   Iva Boop MD, [facility]   Signed by:   Iva Boop MD, FACG on 02/02/2010   Method used:   [facility]ron[facility] to        [facility]  [phone][facility] (retail)       [address] [place], [place]  [address]       Ph: [phone] or [phone]       Fax: [phone]   RxID:   [id number]

## 2011-01-08 LAB — COMPREHENSIVE METABOLIC PANEL
ALT: 16 U/L (ref 0–35)
AST: 19 U/L (ref 0–37)
BUN: 8 mg/dL (ref 6–23)
CO2: 28 mEq/L (ref 19–32)
Calcium: 9.4 mg/dL (ref 8.4–10.5)
Creatinine, Ser: 0.7 mg/dL (ref 0.4–1.2)
GFR calc non Af Amer: >60 mL/min (ref >60)
Glucose, Bld: 87 mg/dL (ref 70–99)
Potassium: 3.8 mEq/L (ref 3.5–5.1)

## 2011-01-08 LAB — CBC
MCV: 88.7 fL (ref 78.0–100.0)
Platelets: 287 10*3/uL (ref 150–400)
RBC: 4.57 MIL/uL (ref 3.87–5.11)
RDW: 13.3 % (ref 11.5–15.5)

## 2011-01-08 LAB — POCT URINALYSIS DIP (DEVICE)
Hgb urine dipstick: NEGATIVE
Nitrite: POSITIVE — AB
pH: >=9 (ref 5.0–8.0)

## 2011-01-08 LAB — DIFFERENTIAL
Basophils Absolute: 0 10*3/uL (ref 0.0–0.1)
Basophils Relative: 0 % (ref 0–1)
Lymphs Abs: 1.5 10*3/uL (ref 0.7–4.0)
Neutrophils Relative %: 84 % — ABNORMAL HIGH (ref 43–77)

## 2011-01-08 LAB — LIPASE, BLOOD: Lipase: 25 U/L (ref 11–59)

## 2011-01-17 ENCOUNTER — Encounter: Payer: Self-pay | Admitting: Physical Medicine & Rehabilitation

## 2011-01-17 ENCOUNTER — Encounter: Payer: Medicare Other | Attending: Physical Medicine & Rehabilitation

## 2011-01-17 ENCOUNTER — Encounter: Payer: Medicare Other | Admitting: Physical Medicine & Rehabilitation

## 2011-01-17 DIAGNOSIS — G43909 Migraine, unspecified, not intractable, without status migrainosus: Secondary | ICD-10-CM | POA: Insufficient documentation

## 2011-01-17 DIAGNOSIS — IMO0001 Reserved for inherently not codable concepts without codable children: Secondary | ICD-10-CM | POA: Insufficient documentation

## 2011-01-17 DIAGNOSIS — M47817 Spondylosis without myelopathy or radiculopathy, lumbosacral region: Secondary | ICD-10-CM | POA: Insufficient documentation

## 2011-01-17 DIAGNOSIS — G8929 Other chronic pain: Secondary | ICD-10-CM | POA: Insufficient documentation

## 2011-01-17 DIAGNOSIS — G4700 Insomnia, unspecified: Secondary | ICD-10-CM | POA: Insufficient documentation

## 2011-01-17 DIAGNOSIS — M545 Low back pain, unspecified: Secondary | ICD-10-CM | POA: Insufficient documentation

## 2011-01-17 DIAGNOSIS — F329 Major depressive disorder, single episode, unspecified: Secondary | ICD-10-CM | POA: Insufficient documentation

## 2011-01-17 DIAGNOSIS — G43019 Migraine without aura, intractable, without status migrainosus: Secondary | ICD-10-CM

## 2011-01-17 DIAGNOSIS — F3289 Other specified depressive episodes: Secondary | ICD-10-CM | POA: Insufficient documentation

## 2011-01-23 LAB — CBC
HCT: 36.3 % (ref 36.0–46.0)
Hemoglobin: 10.5 g/dL — ABNORMAL LOW (ref 12.0–15.0)
Hemoglobin: 12.1 g/dL (ref 12.0–15.0)
MCHC: 33.3 g/dL (ref 30.0–36.0)
RBC: 3.53 MIL/uL — ABNORMAL LOW (ref 3.87–5.11)
RBC: 4.11 MIL/uL (ref 3.87–5.11)
RDW: 15.8 % — ABNORMAL HIGH (ref 11.5–15.5)
WBC: 16.2 10*3/uL — ABNORMAL HIGH (ref 4.0–10.5)
WBC: 23.6 10*3/uL — ABNORMAL HIGH (ref 4.0–10.5)

## 2011-01-25 LAB — CBC
HCT: 30.1 % — ABNORMAL LOW (ref 36.0–46.0)
MCV: 89.9 fL (ref 78.0–100.0)
RBC: 3.35 MIL/uL — ABNORMAL LOW (ref 3.87–5.11)
WBC: 10.1 10*3/uL (ref 4.0–10.5)

## 2011-02-14 ENCOUNTER — Encounter: Payer: Self-pay | Admitting: Physical Medicine & Rehabilitation

## 2011-02-19 ENCOUNTER — Encounter: Payer: Self-pay | Admitting: Physical Medicine & Rehabilitation

## 2011-02-19 NOTE — Assessment & Plan Note (Signed)
HISTORY OF PRESENT ILLNESS:  [name] [name] is here regarding her fibromyalgia, chronic pain.  She is having increasing headaches again associated with the pollen season.  She has had good response with Botox injection in the past which we did in November and really was working until she had the exacerbatory seasonal allergies come on.  The hydrocodone is really not helping for any breakthrough headaches since she is really what I think tough those out when she has had them.  Her pain is 5-6/10 located in the head as well as the neck, low back, and legs.  REVIEW OF SYSTEMS:  Notable for occasional anxiety.  SOCIAL HISTORY:  As noted above.  She is married and doing well with her family.  Her father has had some health issues unfortunately.  PHYSICAL EXAMINATION:  VITAL SIGNS:  Blood pressure 120/57, pulse is 74, respiratory rate 18, and she is saturating 99% on room air. GENERAL:  The patient is pleasant, alert, and oriented x3.  Affect is extremely bright and appropriate. HEART:  Regular. CHEST:  Clear. ABDOMEN:  Soft and nontender.  ASSESSMENT: 1. Fibromyalgia syndrome. 2. Migraine headaches. 3. Insomnia. 4. Depression. 5. 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  PLAN: 1. After informed consent, we performed Botox injections today.     Diagnosis for migraine headaches is 346.11, intractable migraine.     After cleansing the skin with isopropyl alcohol, I injected a total     100 units of Botox using a 26-gauge needle.  We diluted the Botox     in 2 mL of preservative-free normal saline.  I injected 10 units     into each temporalis muscle, 10 units at the frontalis regions     bilaterally totaling 40 units there.  I injected the remaining 40     units around the procerus muscle bilaterally, as this was the     center of her headache related pain today.  The patient tolerated     well with only minimal bleeding.  Dressing was applied as     appropriate. 2. I  gave the patient prescription for Demerol 50 mg half to one daily     p.r.n. for breakthrough headaches.  We can try this and see how she     responds. 3. I will see her back here in about 3 months' time.     Ranelle Oyster, M.D. Electronically Signed    ZTS/MedQ D:  01/17/2011 13:08:54  T:  01/17/2011 22:13:55  Job #:  [id number]

## 2011-03-06 NOTE — Assessment & Plan Note (Signed)
HISTORY:  [name] [name] is back regarding her fibromyalgia.  She has been doing  fairly well with headaches since Botox injections in November.  She is  struggling with an upper respiratory infection currently and that has  been making her headache pain a bit worse.  She rates her pain today 6-7  out of 10.  She describes it as stabbing, aching, intermittent and  burning.  Her pain interferes with general activity and relations with  others  on a moderate to severe level.  Sleep is fair.  Her back and leg  have been doing a bit better with her exercise regimen.  She saw Billie Ruddy for headache followup recently as well.   REVIEW OF SYSTEMS:  Notable for the above.  Full review is in the health  and history section of the chart.  Patient is currently on antibiotic  for her cold symptoms.   SOCIAL HISTORY:  Without significant change.   PHYSICAL EXAMINATION:  VITAL SIGNS:  Blood pressure 127/82, pulse 99,  respiratory rate 18, she is satting 99% on room air.  GENERAL:  Patient is pleasant, alert and oriented x3.  HEART:  Regular.  CHEST:  Notable for audible rhonchi and patient has wet cough today.  NEUROLOGICAL:  Gait is stable.  Coordination is fair.  Strength is 5 out  of 5 in all 4 extremities.  Sensation is intact.  She has tender points  throughout familiar spots in neck, trunk, back and legs.   ASSESSMENT:  1. Fibromyalgia syndrome.  2. Migraine headaches.  3. Insomnia.  4. Depression and chronic fatigue.  5. Chronic low back pain.   PLAN:  1. Patient scheduled for followup Botox injections in 2 month's time.  2. Recommend ENT followup again for chronic sinusitis.  Patient is on      antibiotics, and hopefully this will help her current situation.      Recommended over-the-counter symptomatic medications.  3. Continue with fentanyl patch for now as this seems to have helped      her general pain.  We will try every 4 day dosing essentially.  I      wrote it for every 3 days  just in case that does not work out for      her.  We may need to consider going up to a 25 microgram patch.  4. I will see her back in about 2 month's time.  She can call for      refills on her fentanyl.      Ranelle Oyster, M.D.  Electronically Signed     ZTS/MedQ  D:  11/07/2007 09:41:14  T:  11/07/2007 10:28:37  Job #:  [id number]

## 2011-03-06 NOTE — H&P (Signed)
[name] [name], [name] [name]                 ACCOUNT NO.:  [id number]   MEDICAL RECORD NO.:  [id number]          PATIENT TYPE:  INP   LOCATION:  9153                          FACILITY:  [facility]   PHYSICIAN:  Guy Sandifer. Henderson Cloud, M.D. DATE OF BIRTH:  10/15/1976   DATE OF ADMISSION:  05/20/2008  DATE OF DISCHARGE:                              HISTORY & PHYSICAL   CHIEF COMPLAINT:  Cervical shortening   HISTORY OF PRESENT ILLNESS:  This patient is a 35 year old white female  G4, P0 with an EDC of July 25, 2008, which is consistent with a 6-5/7  weeks ultrasound placing her at 30-4/7 weeks' today.  She had little  vaginal spotting last week.  She returns to the office this week with  complaints of spotting again yesterday.  It is resolved today.  While in  the office fetal heart tones were auscultated.  There is a small amount  of old brown discharge noted.  Cervix is closed to inspection.  Fetal  fibronectin was done and is pending at the time of dictation.  Subsequently, the ultrasound was done, which revealed a vertex infant  with a cervical length of 4 mm with internal funneling.  Ultrasound on  May 10, 2008, was consistent with an estimated fetal weight of 2 pounds  14 ounces, which is the 69th percentile, and AFI of 23rd percentile.  The patient denies gross rupture of membranes, headache, vision change,  or epigastric pain.  Blood pressure in the office was 102/64.  She is  being admitted for further evaluation and management.   PAST MEDICAL HISTORY:  Remarkable for fibromyalgia, borderline  hypothyroidism, abnormal Pap smear, reflux, and pituitary tumor.   PAST SURGICAL HISTORY:  Remarkable for laparoscopic myomectomy.  See  prenatal history and physical form for further details.   OBSTETRICAL HISTORY AND FAMILY HISTORY:  See prenatal history and  physical form.   MEDICATIONS:  Vitamins.   ALLERGIES:  No known drug allergies.  She is allergic to LATEX.   PHYSICAL EXAMINATION:  VITAL  SIGNS:  Height 5 feet 6 inches, weight 168  pounds, blood pressure 102/64.  LUNGS:  Clear to auscultation.  HEART:  Regular rate and rhythm.  ABDOMEN:  Gravid and nontender.  PELVIC EXAM:  Cervix is closed and soft.  EXTREMITIES:  Grossly within normal limits.  NEUROLOGICAL:  Exam grossly within normal limits.   ASSESSMENT:  1. Intrauterine pregnancy at 30-4/7 weeks.  2. Cervical funneling.   PLAN:  We will admit to hospital.  IV fluids.  Strict bedrest.  Steroids, IV antibiotics, and continuous monitoring.      Guy Sandifer Henderson Cloud, M.D.  Electronically Signed     JET/MEDQ  D:  05/20/2008  T:  05/21/2008  Job:  [id number]

## 2011-03-06 NOTE — Letter (Signed)
May 21, 2008    Guy Sandifer. Henderson Cloud, M.D.  [address]  [place], [place] [address]   RE:   AMONDA, BRILLHART  MR#   [id number]  ACC#        [id number]   MFM CONSULTATION REPORT   Dear Dr. Henderson Cloud:   Thank you for allowing me to see Ms. [name] [name] today in consultation.  I saw  her, per your request, secondary to concern for advanced cervical  effacement at 30 5/[redacted] weeks gestation.   HISTORY OF PRESENT ILLNESS:  The patient is a 35 year old, G4 P 0-0-3-0,  currently at 30 weeks and 5 days, admitted from the office on May 20, 2008, secondary to a shortened cervix.  In the office it was felt that  her cervical ultrasound revealed no measurable cervix, with the  patient's recent report of vaginal spotting and pelvic pressure the day  before as well as this being a change from vaginal spotting and a  reassuring cervical length approximately 1 week previously.  The patient  is currently not complaining of any contractions, does have some mild  pelvic pressure and reports good fetal movement.  She also denies any  recent vaginal bleeding.  She is currently receiving magnesium tocolysis  and has also received her first course of antenatal corticosteroid on  May 20, 2008.   PAST MEDICAL HISTORY:  Is notable for:  1. Myomectomy and uterine suspension.  2. History of fibromyalgia.   MEDICATIONS:  Prenatal Vitamins.   SENSITIVITIES:  TO PAIN MEDICATIONS SUCH AS DARVOCET.   PAST OB HISTORY:  Notable for 2 SABs and EAB, all in the first  trimester.   GYN HISTORY:  Negative for any abnormal Pap smears or STDs.   SOCIAL HISTORY:  Negative for any alcohol, tobacco or drugs of abuse.   FAMILY HISTORY:  Noncontributory.   LABORATORY:  I do not have her prenatal labs available for review at  this time.   Ultrasound was done today and this revealed a fetus in the vertex  presentation with normal amniotic fluid volume and an estimated fetal  weight at 31 weeks and 0 days with an  estimated fetal weight of 1683  grams, which is at the 60th percentile.  Cervical length evaluation via  transvaginal ultrasound revealed a normal cervical length of nearly 3.5  cm, there was a 4 mm funnel within the endocervical canal and, per  patient report, the digital exam performed yesterday revealed the  external os to be digitally closed.  There was no change in the cervix  noted with Valsalva or with suprapubic pressure or during the duration  of the entire evaluation of nearly 5 minutes.   No other physical examination was done today.   RECOMMENDATIONS:  A 30-week and 5-day intrauterine pregnancy with  concern for preterm labor and advanced cervical effacement, based on an  outside scan, currently with reassuring findings.  The patient has  already received her first dose of antenatal corticosteroids and will  receive her second dose today to complete the entire course.  She is  also receiving magnesium for tocolysis during steroid time.  Given these  reassuring findings via ultrasound, I feel that short term monitoring is  quite appropriate.  However, if the cervical length is again reassuring  on a follow-up evaluation later this weekend or on Monday, she would be  a good candidate for outpatient observation.  If, however, on follow-up  the cervix appears to be shortened, then  we can re-evaluate at that  time.  She does not have a history for preterm labor or any cervical  surgery that would put her at increasd risk for cervical insufficiency.  Questions regarding preterm labor/birth were addressed.   If you have any additional questions related to her cervical evaluation,  please feel free to contact me.  Thank you once again for allowing me to  participate in the care of Ms. [name] [name] during this admission.  Total face-  to-face time during this consultation, per your request, was 40 minutes.   Sincerely,   Sincerely,      Toma Copier, MD  Assistant Professor, Maternal  Fetal Medicine  [facility]  Electronically Signed     SJ/MEDQ  D:  05/21/2008  T:  05/21/2008  Job:  [id number]

## 2011-03-06 NOTE — Procedures (Signed)
NAMESHATERRA, SANZONE                 ACCOUNT NO.:  [id number]   MEDICAL RECORD NO.:  [id number]          PATIENT TYPE:  REC   LOCATION:  [facility]                          FACILITY:  [facility]   PHYSICIAN:  Ranelle Oyster, M.D.DATE OF BIRTH:  12/21/1975   DATE OF PROCEDURE:  09/05/2007  DATE OF DISCHARGE:                               OPERATIVE REPORT   PROCEDURE:  Botox, diagnostic code 346.11.   DESCRIPTION OF PROCEDURE:  After informed consent, we injected multiple  muscles today including bilateral temporalis muscles, bilateral frontal  muscles, and procerus muscle using 100 units botulinum toxin type A  total.  This was diluted in 2 mL of preservative free normal saline.  The patient tolerated well without incident today.   We will start her on a low dose fentanyl patch 12 mcg for baseline pain  control as we have not gone this route yet and the patient expressed an  interest today.  I refilled her Adderall.  The patient will continue  with Mestinon during the day which has helped some of her energy levels.      Ranelle Oyster, M.D.  Electronically Signed     ZTS/MEDQ  D:  09/05/2007 16:19:57  T:  09/06/2007 20:40:42  Job:  [id number]

## 2011-03-06 NOTE — Assessment & Plan Note (Signed)
[name] [name] is back regarding her fibromyalgia and headaches. She did not do  well with the Keppra as it made her feel generally very groggy without  substantial benefit to go with that. She rates her pain a 6/10. She does  not that the Adderall is helpful as it seems it make her too jumpy or  dysfunctional at times. Vicodin helps her breakthrough pain but she uses  it rarely. She is still having some problems getting to sleep and notes  anxiety is an issue. She has had ongoing issues with headaches. She has  responded fairly well to Botox in the past. She does have sinus symptoms  and had seen ENT previously who recommended multiple medications to  alleviate congestion, all of which increased headaches.   The patient rates her pain a 6-7/10 and states that it interferes with  general activities, relations with others and enjoyment of life on a  moderate to severe level. Sleep is poor.   REVIEW OF SYSTEMS:  Notable for depression, anxiety and all the other  issues mentioned above.   SOCIAL HISTORY:  Without change.   PHYSICAL EXAMINATION:  Blood pressure is 110/52, pulse is 86,  respiratory rate 16, patient is sating 98% on room air.  The patient is generally pleasant, alert and oriented x3.  Gait is stable. She had intact sensation and motor function.  HEAD:  Notable for tenderness throughout the frontalis, procerus and  Massier muscles today.  BACK:  Low back was not examined today.  HEART:  Regular.  CHEST:  Clear.  ABDOMEN:  Soft, nontender.   ASSESSMENT:  1. Fibromyalgia syndrome, 729.1.  2. Migraine headaches, 346.11.  3. Insomnia.  4. Depression and chronic fatigue.  5. Chronic low back pain.   PLAN:  1. After informed consent, we injected the bilateral frontalis as well      as procerus muscles and masseter muscles using 100 units of      botulinum toxin A total. It was diluted in 2 mL of preservative      free normal saline. The patient tolerated it with only minor  bruising and bleeding.  2. Will begin trial of Mestinon 30 mg b.i.d. to help with energy, pain      and mood. The patient was briefed on effects and side effects of      medication.  3. The patient can continue with Adderall potentially depending on      results with Mestinon.  4. Vicodin for severe breakthrough pain.  5. Recommended ENT followup for other options regarding her chronic      sinusitis.  6. Will see her back in about 3 months' time.     Ranelle Oyster, M.D.  Electronically Signed    ZTS/MedQ  D:  06/09/2007 12:40:50  T:  06/09/2007 18:02:18  Job #:  [id number]

## 2011-03-06 NOTE — Procedures (Signed)
[name] [name], [name] [name]                 ACCOUNT NO.:  [id number]   MEDICAL RECORD NO.:  [id number]          PATIENT TYPE:  REC   LOCATION:  TPC                          FACILITY:  [facility]   PHYSICIAN:  Ranelle Oyster, M.D.DATE OF BIRTH:  11/02/1975   DATE OF PROCEDURE:  DATE OF DISCHARGE:                               OPERATIVE REPORT   PROCEDURE:  Botox injection, diagnostic code 346.11 intractable migraine  headaches.   DESCRIPTION OF PROCEDURE:  After informed consent and preparation of  skin with isopropyl alcohol, we injected a total of 100 units of Botox  today diluted in total 2 mL preservative-free normal saline.  I injected  approximately 10 units into each masseter muscle, 15 units into the  temporalis muscles, 15 units into the frontalis muscles bilaterally, and  20 mL into the procerus muscles.  The patient tolerated without any ill  effects except for some minor bleeding and bruising.  She was given post  injection instructions.  She is responded well to these in the past.   I also started the patient on Savella today titrating up from 12 mg  daily to 50 daily.  We will observe for effects.  Encourage appropriate  diet, appetite supplements, exercise, etc.  I will see her back in about  5 months.      Ranelle Oyster, M.D.  Electronically Signed     ZTS/MEDQ  D:  01/03/2009 13:36:16  T:  01/04/2009 03:53:45  Job:  [id number]

## 2011-03-06 NOTE — Assessment & Plan Note (Signed)
Shon is back regarding her fibromyalgia.  It has been about 11 months  since I have seen her.  She actually became pregnant shortly after her  last visit and did not feel that she needed to come back as she was  limited with treatment options here during her pregnancy.  She had her  baby on July 26, 2008, and is now breast-feeding.  She is having more  pain and headaches again since the baby was born.  She notes that the  pain increases dramatically, especially her headaches, when she takes  her birth control pill, which is heavily weighted towards the  progesterone side.  She rates her pain a 5-6/10.  She described it as  aching and stabbing at times.  The patient interferes with general  activity, in relationship with others, and enjoyment of life on a  moderate level.  I probably feel a lot of stress is related to her  recent lifestyle changes with the baby with sleepless nights, stress of  the baby, family, holidays coming up, etc.  Her OB/GYN felt it would be  beneficial to go back on the Wellbutrin, but she has been hesitant to do  so due to concerns about the baby.   Sleep has been poor.   REVIEW OF SYSTEMS:  Notable for the above.  Full review is in the  written health and history section of the chart.   SOCIAL HISTORY:  As noted above.   PHYSICAL EXAMINATION:  VITAL SIGNS:  Blood pressure 112/60, pulse is 93,  respiratory rate 18, and she is sating 99% on room air.  GENERAL:  The patient is pleasant, alert, and oriented x3.  Weight looks  good.  CHEST:  Clear.  HEART:  Regular.  She continues to have some tender points throughout,  although we did not examine these in detail today.   ASSESSMENT:  1. Fibromyalgia syndrome.  2. Migraines headaches, which appear to be heavily associated to her      hormonal balance.  3. Insomnia.  4. Depression.  5. Chronic low back pain.  6. Breast-feeding.   PLAN:  1. I need to contact Botox regarding safety of using Botox  during      breast-feeding.  2. I asked her to follow up with her OB/GYN regarding her birth      control pill and whether hoping that is less progesterone weighted      might be more tolerable for her from a headache standpoint.  3. I gave her a sample for Voltaren gel.  I also asked her to check      whether OB/GYN, as this is class C medication regarding pregnancy.  4. She will continue with her supplements for now including magnesium      and I wrote her a prescription for [place].  5. Obtained the labs today including thyroid function test and vitamin      D levels.  6. We will see her back in about 3 months' time.      Ranelle Oyster, M.D.  Electronically Signed     ZTS/MedQ  D:  09/21/2008 12:29:44  T:  09/22/2008 00:49:31  Job #:  [id number]

## 2011-03-06 NOTE — Assessment & Plan Note (Signed)
FOLLOWUP OFFICE NOTE   [name] [name] is back regarding her fibromyalgia.  She had a uterine fibroid  removed in May.  She states the back pain was a bit better at the time  of the surgery due to pain meds, et Karie Soda.  She is now experiencing  more back and right leg pain again.  She states that the mass in the  uterus was posteriorly positioned and may have bene effecting her nerve  there.  Her headache spiked around the time of the surgery due to  hormonal medications she was taking.  The headaches now are back to  their baseline.  She complains of frontal and jaw pain, as well as low  back and right leg pain.  She has been off the Adderall, which we had  recommended for activation.  She had used Vicodin through her OB/GYN  doctor on a daily basis p.r.n. for breakthrough symptoms.   REVIEW OF SYSTEMS:  The patient reports anxiety.  Other pertinent  positives listed above.  The full review is in the health and history  section.   SOCIAL HISTORY:  Without change today.   PHYSICAL EXAM:  Blood pressure 120/65, pulse 92, respiratory rate 20.  She is satting 100% on room air.  The patient is generally pleasant, appropriate, alert and oriented x3.  Gait is stable.  Phonation is fair.  She has 2+ reflexes and intact  sensation and motor function in both legs today.  She had thump test,  which was equivocal on the right and negative on the left.  She is able  to forward flex with minimal tenderness today.  She extended without  significant pain awake.  She had some palpable tenderness along the  lower lumbar facets on the right side and into the gluteus maximus and  piriformis area.  Piriformis stretching did not significantly increase  her pain symptoms.  She has notable tender points still throughout.  HEART:  Regular.  CHEST:  Clear.  ABDOMEN:  Soft and nontender.   ASSESSMENT:  1. Fibromyalgia syndrome.  2. Migraine headaches.  3. Insomnia.  4. Depression and chronic fatigue.  5. Low  back pain, questionable radiculopathy.  This may be related to      uterine fibroids and possible lumbosacral plexus compression.   PLAN:  1. I will observe for further improvement of symptoms now that she is      postoperative.  Begin Keppra short-term for nerve irritation.      Start at 250 mg nightly and increase to b.i.d. after 1 week as      tolerated.  2. Continue Adderall 5 mg XR for activation.  3. Refilled Vicodin for breakthrough pain.  4. Encouraged low back and lower extremity stretching and      strengthening exercises.  Consider lumbar MRI film.  5. Will see the patient back in 2 to 3 months' time at which time we      will repeat Botox to the head for headaches.      Ranelle Oyster, M.D.  Electronically Signed    ZTS/MedQ  D:  04/29/2007 11:22:10  T:  04/29/2007 12:56:38  Job #:  [id number]

## 2011-03-09 NOTE — Procedures (Signed)
[name] [name]                 ACCOUNT NO.:  [id number]   MEDICAL RECORD NO.:  [id number]          PATIENT TYPE:  REC   LOCATION:  [facility]                          FACILITY:  [facility]   PHYSICIAN:  Erick Colace, M.D.DATE OF BIRTH:  June 15, 1976   DATE OF PROCEDURE:  08/17/2004  DATE OF DISCHARGE:                                 OPERATIVE REPORT   [name] [name] returns today after I last saw her June 12, 2004. Neck extensor  strength has improved. She would like some treatment of her headache pain.  In addition, she complains of neck pain, bilateral upper trapezius pain, and  also wonders whether she has fibromyalgia. She has nonrestorative sleep but  no real issues in terms of bowel and bladder. Some anxiety, some depression.  She states she has 2 types of headaches, both migraine type of headaches,  which are associated with triggers as well as more chronic daily headaches.   Her pain diagram shows pain in the forehead, TMJ area, occiput, parascapular  area medially, and low back.   Pain score is 6 to 7 on average; pain exacerbating with walking, sitting for  excess period of time, bending, and working for excess periods as well as  driving and stress.   SOCIAL HISTORY:  Single.   PHYSICAL EXAMINATION:  Blood pressure 113/64, pulse 114, respirations 20, O2  saturation 97% on room air.   Head has some mild tenderness to palpation bilateral frontalis muscle as  well as anterior temporalis muscle. No pain in the posterior temporalis. Had  some tenderness bilateral upper trapezius/right sternal costal, negative at  [facility], positive bilateral greater trochanter and unilateral back, and left  sided epicondyle, negative at the knees.   IMPRESSION:  1.  Chronic myofascial pain syndrome.  2.  Migraine headache.  3.  Chronic daily headaches.   PLAN:  1.  Will trial lidocaine injection frontalis as well as temporalis muscles.      If positive temporary relief, botulinum toxin.  2.   Initiation of nortriptyline 10 q.h.s. Went over risks and benefits of      the medication with her. She understands fully.   ADDENDUM:  Trigger point injection.   Informed consent was obtained after describing risks and benefits of the  procedure to the patient. The patient elects to proceed.   The patient placed supine on exam table. Two areas on each frontalis at the  middle wrinkle line were marked and prepped with Betadine, then entered with  27-gauge 1/2-inch needle, and 0.25 cc of 1% lidocaine were injected at each  site. Then the right temporalis muscle was palpated and tender point was  obtained, prepped with Betadine, and entered with 27-gauge 5/8-inch needle  and 0.25 cc of 1% lidocaine was injected. Then the left temporalis was  injected with 0.5 cc of 1% lidocaine with 5/8-inch 27-gauge needle. The  patient tolerated the procedure well. Post injection instructions given. She  is to call tomorrow to let us know how she is doing with this.       AEK/MEDQ  D:  08/17/2004 14:54:06  T:  08/18/2004 13:08:00  Job:  [id number]

## 2011-03-09 NOTE — Procedures (Signed)
[name], [name]                 ACCOUNT NO.:  [id number]   MEDICAL RECORD NO.:  [id number]          PATIENT TYPE:  REC   LOCATION:  [facility]                          FACILITY:  [facility]   PHYSICIAN:  Erick Colace, M.D.DATE OF BIRTH:  1976/10/20   DATE OF PROCEDURE:  09/07/2004  DATE OF DISCHARGE:                                 OPERATIVE REPORT   MEDICAL RECORD NUMBER:  [id number].   DATE OF BIRTH:  [birth year].   Dictation canceled.       AEK/MEDQ  D:  09/07/2004 14:57:49  T:  09/08/2004 14:49:50  Job:  [id number]

## 2011-03-09 NOTE — Assessment & Plan Note (Signed)
[facility]                         GASTROENTEROLOGY OFFICE NOTE   NAME:[name], [name] SENN                        MRN:          [id number]  DATE:12/18/2006                            DOB:          05/22/1976    REFERRING PHYSICIAN:  Hal Morales, M.D.   CHIEF COMPLAINT:  Irritable bowel symptoms, abdominal pain, bowel habit  changes.   ASSESSMENT:  A 35 year old white woman with a long history of abdominal  pain.  She is having problems with constipation more so than diarrhea  but has what sounds like alternating irritable bowel syndrome.  She does  have a fair amount of tenderness and fullness in the right lower  quadrant so something like Crohn's disease is in the differential, as is  celiac disease potentially.  She had heartburn and reflux problems that  seem to be worse on omeprazole, as well as possibly having abdominal  pain related to that.  She has also had some rectal  bleeding/hematochezia and maybe even some melena.   PLAN:  1. Laboratory investigation to include tissue transglutaminase      antibody and IgA level, CBC and CMET.  2. Colonoscopy to investigate the bowel habit changes and the bleeding      and the right lower quadrant fullness mainly to rule out something      like Crohn's disease, though I suppose gastrointestinal neoplasm is      in the differential.  There may be some second or third degree      relatives with colon cancer and others with polyps, though I do not      have exact details on that history.  3. Protonix samples for heartburn problems (she had an EGD as part of      a GERD study) by Dr. Russella Dar in 2005.  It was normal except for      hiatal hernia.  4. I have prescribed Levsin SL to be used as needed.  5. MiraLax one or two doses a day to help bowel movements, titrating      for effect.  6. Further plans pending the above workup and response to therapy.   HISTORY:  This is a pleasant 35 year old white  woman that has had  problems for years.  She has had heartburn symptoms originally helped by  Protonix and she was part of a study, or she had received that after the  study.  She was placed on omeprazole.  She felt like that made her cramp  worse and her heartburn was worse.  She is complaining of right lower  quadrant and some epigastric pain.  Bowel habits tend to be more  constipating than loose, but she will really alternate overall.  She has  had some blood mixed in with the stool, some blood on the toilet paper,  and some very dark, perhaps melenic stools.  She has also had some pale  stools.  She does tend to strain to defecate when she feels constipated.  This has occurred despite Metamucil and oat bran.  She has been on  Lyrica intermittently and thinks  that may exacerbate symptoms, and her  symptoms are worse during her menstrual period, and she will have some  fairly intense menstrual cramps for about 2 days.  She is not describing  dysphagia.  There is a background of some nausea.  There is some  bloating but not too much gas.  She is a vegetarian and complains of  fatigue and wonders about her vitamin levels, particularly B12.   PAST MEDICAL HISTORY:  1. Depression.  2. Chronic back pain with sciatica.  3. Resection of benign pituitary tumor in 1999.  4. Anxiety and panic disorder, 1999 and 2000.   MEDICATIONS:  1. Multivitamin.  2. Vitamin D.  3. Calcium.  4. Vitamin B12 complex.  5. Melatonin.  6. Rolaids p.r.n.  7. Codeine p.r.n.  8. Lyrica p.r.n.   DRUG ALLERGIES:  None known.   FAMILY HISTORY:  Diabetes, heart disease, prostate cancer in first and  second degree relatives.   SOCIAL HISTORY:  She is single.  She is on disability.  She is taking a  break from school but she is pursuing a Masters degree.  Rare alcohol.  No tobacco or drugs.  She lives alone.   REVIEW OF SYSTEMS:  As above.  She feels thirsty and tired, muscle pains  and cramps.  She has  allergy and sinus problems at times.  All other  systems are negative.   PHYSICAL EXAMINATION:  GENERAL:  Reveals a well-developed white woman in  no acute distress.  VITAL SIGNS:  Height 5 feet 6 inches, weight 141.8 pounds, blood  pressure 96/64, pulse 68.  HEENT:  Eyes anicteric.  ENT:  Normal mouth and posterior pharynx.  NECK:  Supple without mass.  CHEST:  Clear.  HEART:  S1, S2, no gallops.  ABDOMEN:  Shows some mild fullness and tenderness in the right lower  quadrant.  There is no organomegaly or mass.  RECTAL:  Deferred at this time.  LYMPHATIC:  No neck or supraclavicular nodes.  SKIN:  Warm, dry, no acute rash noted.  PSYCHIATRIC:  She is alert and oriented x3.   I appreciate the opportunity to care for this patient.     Iva Boop, MD,FACG  Electronically Signed    CEG/MedQ  DD: 12/18/2006  DT: 12/18/2006  Job #: [id number]   cc:   Hal Morales, M.D.  Ranelle Oyster, M.D.

## 2011-03-09 NOTE — Procedures (Signed)
[name] [name], [name] [name]                 ACCOUNT NO.:  [id number]   MEDICAL RECORD NO.:  [id number]          PATIENT TYPE:  REC   LOCATION:  [facility]                          FACILITY:  [facility]   PHYSICIAN:  Ranelle Oyster, M.D.DATE OF BIRTH:  Mar 17, 1976   DATE OF PROCEDURE:  12/19/2005  DATE OF DISCHARGE:                                 OPERATIVE REPORT   DATE OF VISIT:  December 19, 2005.   PROCEDURE:  Botox injections.   DIAGNOSTIC CODE:  Intractable migraine headaches, 346.11.   DESCRIPTION OF PROCEDURE:  After informed consent and preparation of skin  with isopropyl alcohol, we injected 5 separate muscles in the head including  both masseter muscles, both  frontalis muscles, and procerus muscles.  We  placed approximately 30 units into the frontalis muscles bilaterally, 10  units into the procerus, and 15 units each of the masseter muscles  bilaterally.  We used a 26-gauge half-inch needle for injection.  Aspiration  technique was applied.  Botox was diluted at 100 unit per 1 ml of  preservative free normal saline.  The patient experienced no ill effects.  She was given post injection instructions as well today.   Incidentally, I refilled multiple medications for Isel today including  Omacor b.i.d., #60, Adderall, which we changed to XR form 15 mg a day, #30,  and Limbrel 500 mg we began today b.i.d., #60.  She was given samples of  this.   I will see the patient back in about 2 months time.      Ranelle Oyster, M.D.  Electronically Signed     ZTS/MEDQ  D:  12/19/2005 10:13:28  T:  12/19/2005 14:21:19  Job:  [id number]

## 2011-03-09 NOTE — Procedures (Signed)
NAMEBRAYLEE, LAL                 ACCOUNT NO.:  [id number]   MEDICAL RECORD NO.:  [id number]          PATIENT TYPE:  REC   LOCATION:  [facility]                          FACILITY:  [facility]   PHYSICIAN:  Ranelle Oyster, M.D.DATE OF BIRTH:  01/16/1976   DATE OF PROCEDURE:  01/29/2007  DATE OF DISCHARGE:                               OPERATIVE REPORT   PROCEDURE:  Botox injection, 346.11, for refractory migraine headaches.  The patient has responded well to prior Botox injections and over the  last 2-3 weeks has noticed increase in symptoms once again.   DESCRIPTION OF PROCEDURE:  After informed consent and preparation of the  skin with isopropyl alcohol, I injected the head and facial muscles at  several locations.  We used 10 units at two different locations in the  right frontalis area and again in the left frontalis area.  I also  injected the procerus muscles with 20 units of Botox.  I used the last  40 units of Botox into each temporalis muscle at two separate access  points.  The patient tolerated these injections with minimal bleeding.  Post injection instructions were given.   I refilled Adderall today 5 mg one to two q.a.m.  The patient will stop  Lyrica due to excessive drowsiness.  I will see the patient back in  about 3 months' time.      Ranelle Oyster, M.D.  Electronically Signed     ZTS/MEDQ  D:  01/29/2007 11:49:50  T:  01/29/2007 12:35:35  Job:  [id number]

## 2011-03-09 NOTE — Assessment & Plan Note (Signed)
HISTORY OF PRESENT ILLNESS:  [name] [name] is back regarding her fibromyalgia and  headaches.  She has had 5-6 weeks of relief with Botox injections we have  performed.  She has been very happy with that.  She felt that her energy was  better with the Adderall and Wellbutrin, but she felt a bit overstimulated.  She stopped taking the Wellbutrin and does not feel as well, but was hoping  maybe to look at another antidepressant.  She will approach her pain at a 6-  8/10 currently.  She describes her pain as sharp, dull, stabbing, constant  aching.  She still received Acupuncture and other modalities which are  helpful for her pain.  She tries to stay active.  She states her pain is  usually worse in the morning and daytime hours.  The pain increased with  general activities, with bending sitting and standing.  States that she can  walk about 20 minutes at a time without having to stop.   The patient remains on Omacor b.i.d. as well as Adderall 20 mg daily XR and  Imitrex p.r.n. multivitamin.   REVIEW OF SYSTEMS:  The patient reports intermittent confusion, depression,  anxiety.  Denies any other new constitutional GU, GI or cardiorespiratory  complaints today.   SOCIAL HISTORY:  The patient still is involved in schooling, but no other  changes are noted.   PHYSICAL EXAMINATION:  VITAL SIGNS:  Blood pressure 113/59, pulse 88,  respirations 16, saturating 98-100% on room air.  GENERAL APPEARANCE:  The patient is pleasant, in no acute distress.  She is  alert and oriented x3.  Affect is bright and appropriate.  Gait is stable.  HEART:  Regular rate and rhythm.  LUNGS:  Clear.  ABDOMEN:  Soft, nontender.  NEUROLOGICAL:  Motor function is generally 5/5.  She definitely has multiple  tender points throughout which are generally stable.  Did not examine her  back or neck in detail today.  Motor function is 5/5.  Sensory function was  within normal limits.   ASSESSMENT:  1.  Fibromyalgia  syndrome.  2.  Persistent sinusitis.  3.  Myofascial neck pain.  4.  Chronic migraine headaches.  5.  Depression and chronic fatigue.   PLAN:  1.  Per patient's request, we will give her a trial of Lexapro, although, I      am not overly optimistic that it will be the solution for her.      Certainly, it can be worth a trial.  2.  Will increase her Adderall to 25 mg daily.  3.  The patient was interested in trying Immunocal to boost her immune      system and proteins.  I wrote her a prescription for this.  4.  Encouraged ongoing exercise and appropriate sleep and diet.  5.  She may try Ribose supplementation as well (5 mg daily).  6.  Consider follow up Botox injections this summer for headache.  7.  I will see the patient back in about three months time.      Ranelle Oyster, M.D.  Electronically Signed     ZTS/MedQ  D:  02/13/2006 13:12:16  T:  02/14/2006 10:12:10  Job #:  [id number]

## 2011-03-09 NOTE — Assessment & Plan Note (Signed)
MEDICAL RECORD NUMBER:  [id number].   Ms. [name] [name] returns today. She has botulinum toxin injection done Mar 01, 2005  with frontalis, temporalis, and some masseter injections. Please refer to  May 11 note. This was not as effective as the injection done December 07, 2004, and the main difference was less placed in the temporalis instead some  toxin placed in the masseter. She has had a previous Botox injection on  September 07, 2004 which was helpful as well and once again mainly just  frontalis and temporalis.   She had been tried on cervical injections, but this caused some mild to  moderate neck extensor weakness, and this troubled the patient. This did, of  course, resolve.   The patient was tried on nortriptyline which she took for about two weeks  but stated it made her too anxious.   In the past, she has had ANA, RA, sed rate, T4, TSH done per Dr. Stevphen Rochester.   The patient did want to pursue electro-acupuncture with me; however, did not  have insurance coverage. Of note is that her previous test noted above were  all normal done on March 29, 2004.   Her pain level is 5 to 6/10 with pain interference score general activity 6,  relationship with other people 6, enjoyment of life 6. Her main areas of  pain are her TMJ area, her neck area, upper scapular area. Does have an area  over her hip which caused some radiation to palpation down to the right  lower extremity.   CURRENT MEDICATIONS:  Really consist of supplements, vitamin E, B complex,  glucosamine, alpha lipoic acid, omega 3, EPA, 5HTP.   PHYSICAL EXAMINATION:  VITAL SIGNS:  Blood pressure 120/62, pulse 95,  respirations 16, O2 saturation 98% on room air.  GENERAL:  No acute distress. Mood and affect appropriate. She has minimal  tenderness to palpation in bilateral upper trapezii, one sore area over the  right gluteus media area.   Neck range of motion was good. Her strength is normal in upper and lower  extremities.   IMPRESSION:  Primarily myofascial pain syndrome. She does have history of  migraines. Some may be hormonally medicated, i.e. period related. 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 States that she had  subluxation or dislocation about a month ago.   PLAN:  I discussed treatment options including Effexor which she states  caused some questionable seizures when she tried that at the [facility]. She similarly tried amitriptyline in the past which was too sedating  and certainly desipramine will cause more anxiety than the nortriptyline, so  that is out as well. We talked about just a plan SSRI such as Zoloft. She is  not sure whether she has had it before, but she states that she was able to  tolerate Prozac.   In addition, we discussed other alternatives. One would be reinjection with  Botox using either the November 2005 or February 2006 injection montage and  dosing. She appears to amendable to that; however, would have to wait two  months.   She does request evaluation per Dr. Riley Kill in regards to hormonal or  supplementation therapy, and I did discuss this with Dr. Riley Kill who is  agreement with this plan, and he can take over further botulinum toxin  injections if need be.  In addition, one other treatment option that patient may benefit from would  be sphenopalatine ganglion block which would be done  per Dr. Celene Kras. We  will have her see Dr. Riley Kill in regards to his treatment plan.      AEK/MedQ  D:  03/23/2005 11:22:17  T:  03/23/2005 13:28:22  Job #:  [id number]

## 2011-03-09 NOTE — Procedures (Signed)
[name] [name], [name] [name]                           ACCOUNT NO.:  [id number]   MEDICAL RECORD NO.:  [id number]                   PATIENT TYPE:  REC   LOCATION:  [facility]                                  FACILITY:  [facility]   PHYSICIAN:  Erick Colace, M.D.           DATE OF BIRTH:  1975/12/30   DATE OF PROCEDURE:  05/23/2004  DATE OF DISCHARGE:                                 OPERATIVE REPORT   REFERRING PHYSICIAN:  Hal Morales, M.D.   DATE OF BIRTH:  09-16-1976.   MEDICAL RECORD NUMBER:  [id number].   PROCEDURE:  Botulinum toxin injection, bilateral cervical paraspinal as well  as right upper trapezius and right levator scapula.   INDICATIONS FOR PROCEDURE:  Spasm muscle, 728.85, with cervicalgia.  She  also has a right trapezius pain radiation pattern into the temporalis and  eye.   Previous relief with lidocaine injection.   DESCRIPTION OF PROCEDURE:  Informed consent was obtained after describing  risks and benefits of the procedure to the patient.  These included  bleeding, bruising, infection, flulike illness as well as dry mouth.  The  patient elected to proceed.  The patient placed prone on EMG table, Betadine  prep, areas marked, two spots marked just lateral to the border of the  trapezius at C3, two spots marked just lateral to the border of trapezius T6  and on the right mid upper trapezius at the mid clavicular line and levator  scapula 2 cm proximal to the upper medial border of the scapula.  Botox  dilution was 100 units per 3 mL.  A 25 mm, 27-gauge Teflon coated injection  needle was utilized under EMG guidance.  There was 0.5 mL solution injected  in each spot with a total of 100 units divided in six spots.  The patient  tolerated the procedure well.  Post injection instructions given.  The  patient to follow up in one month.                                                Erick Colace, M.D.    AEK/MEDQ  D:  05/23/2004 13:02:59  T:  05/24/2004  09:54:32  Job:  [id number]   cc:   Hal Morales, M.D.  Fax: [phone]

## 2011-03-09 NOTE — Discharge Summary (Signed)
[name] [name], [name] [name]                 ACCOUNT NO.:  [id number]   MEDICAL RECORD NO.:  [id number]          PATIENT TYPE:  INP   LOCATION:  9153                          FACILITY:  [facility]   PHYSICIAN:  Zelphia Cairo, MD    DATE OF BIRTH:  [birth year]   DATE OF ADMISSION:  05/20/2008  DATE OF DISCHARGE:  05/22/2008                               DISCHARGE SUMMARY   ADMITTING DIAGNOSES:  1. Intrauterine pregnancy at 30-4/7th weeks' estimated gestational      age.  2. Cervical funneling.   DISCHARGE DIAGNOSES:  1. Intrauterine pregnancy at 30-6/7th weeks' estimated gestational      age.  2. Arrest of preterm labor.   REASON FOR ADMISSION:  Please see dictated H&P.   HOSPITAL COURSE:  The patient is a 35 year old white female gravida 4,  para 0 that was admitted to the [facility] at 30-4/7th  weeks estimated gestational age.  The patient had been seen in the  office where she complained of some vaginal spotting.  She did not have  any ruptured membranes.  Ultrasound had been performed which revealed  cervical funneling and baby was noted to be vertex on ultrasound.  Vital  signs were stable on admission and cervix was noted to be closed.   Laboratory findings revealed a hemoglobin of 10.8, platelet count of  206,000, and WBC count of 10.0.  CMET was normal.  The patient was noted  to be positive for group B beta strep based on the first trimester urine  culture.  Fetal heart tones were reactive and contractions were noted to  be 3 in the last 45 minutes at the time of admission.  The patient was  started on magnesium sulphate for arrest of her labor.  She was given  Unasyn IV and started on betamethasone.  Ultrasound was scheduled for  the following morning.  Later that evening, the patient did complain of  a gush of fluid.  It was observed by the nurse, however, it was negative  for Nitrazine and negative for ferning.  No bleeding was noted.  Vital  signs were stable.   The patient continued to be afebrile.  Abdomen soft.  Sterile speculum was performed which revealed no pooling and no leakage  with cough.  Ultrasound did reveal AF of 12.  Fetal heart tones were  reactive.  No further contractions.  The patient continued on magnesium  sulphate.  On the following morning, the patient did not have any  further leakage of fluid.  Fetal heart tones continued to be reactive.  No further contractions were seen.  The patient did receive her second  dose of betamethasone and ultrasound was repeated, which revealed no  further change in cervical length.  On the following morning, the  patient did not have any further contractions, leakage of fluid, and  baby continued to be reactive.  Ultrasound revealed stable fluid and  cervical length and discharge instructions were given and patient was  later discharged home.   CONDITION ON DISCHARGE:  Stable.   DIET:  Regular as tolerated.  ACTIVITY:  Modified bedrest.  The patient was instructed to call for  increase in contractions or pelvic pressure or decrease in fetal  movement.   DISCHARGE MEDICATIONS:  Prenatal vitamins 1 p.o. daily.      Julio Sicks, N.P.      Zelphia Cairo, MD  Electronically Signed    CC/MEDQ  D:  06/06/2008  T:  06/07/2008  Job:  [id number]

## 2011-03-09 NOTE — Assessment & Plan Note (Signed)
HISTORY:  The patient returns today after having a botulinum toxin injection  done for spasm of the muscle with cervicalgia - code #728.85.  She had  bilateral cervical paraspinal as well as right upper trapezius and right  levator scapular area injected.  She notes no pain relief.  The left  trapezius seems a bit more tender; however, it was not injected.  She does  note some problem pulling her head backward since the injection.   PHYSICAL EXAMINATION:  GENERAL:  In no acute distress.  Mood and affect are  appropriate.  NECK:  Full flexion of the neck.  Extension is limited at 3- when tested in  the prone position with the head hanging over the end of the __________.  She has no evidence of muscle wasting.  No evidence of erythema.  No pain to  palpation.  In the axial area she has mild tenderness to palpation in the  cervical paraspinals as before.  There is no upper extremity weakness.  VITAL SIGNS:  Blood pressure 99/56, pulse 83, respirations 14, O2 saturation  96% on room air.  NEUROLOGIC:  Gait is normal.  Affect is alert.  Appearance is normal.   IMPRESSION:  Mild to moderate neck extensor weakness following botulinum  toxin injection.  As I explained to the patient, this has a self-limited  effect.  Given the mechanism action of Botox, it is not an unusual effect.  What is somewhat unusual is the relatively low dosage that resulted in this.   PLAN:  1. I believe this speaks to poor neck extensor strength to begin with and     poor muscular development in this area, and she may benefit from a     strengthening program.  2. As I discussed with the patient, I do not think that another botulinum     toxin injection in that area would lead to any greater improvement in     pain, which was the primary goal.  She does have some headache pain as     well.  There is a bit more evidence in regards to migraine prophylaxis in     headache, than cervical myofascial pain.  Therefore would  like to do some     trigger point injections in the temporalis muscle, possibly frontalis,     and depending upon response to that, consider botulinum toxin injection     in those areas.      Erick Colace, M.D.   AEK/MedQ  D:  06/12/2004 13:21:06  T:  06/12/2004 14:50:07  Job #:  [id number]   cc:   Celene Kras, MD  Fax: [phone]

## 2011-03-09 NOTE — Procedures (Signed)
NAMEMARIANNY, GORIS                 ACCOUNT NO.:  [id number]   MEDICAL RECORD NO.:  [id number]          PATIENT TYPE:  REC   LOCATION:  [facility]                          FACILITY:  [facility]   PHYSICIAN:  Erick Colace, M.D.DATE OF BIRTH:  April 24, 1976   DATE OF PROCEDURE:  03/01/2005  DATE OF DISCHARGE:                                 OPERATIVE REPORT   MEDICAL RECORD NUMBER:  [id number].   PROCEDURE:  Botulinum toxin injection.   INDICATIONS:  Migraine headaches, muscle spasms, 728.85. Chronic headaches  784.0.   Botulinum toxin was diluted 100 units/cc. Areas marked and prepped with  Betadine. Five units were injected into the procerus, 5 units in the right  and left corrugators supercilii, 5 units into 2 spots in the left frontalis  and 2 spots in the right frontalis, 15 units in the right temporalis and 15  units in the left temporalis, 15 units into the right masseter and 15 units  into the left masseter just inferolateral to the TMJ. The patient to the  procedure well. Post injection instructions given. I will see her back in  four weeks.      AEK/MEDQ  D:  03/01/2005 29:56:21  T:  03/02/2005 07:35:08  Job:  [id number]

## 2011-03-09 NOTE — Assessment & Plan Note (Signed)
Tuesday, September 10, 2006:   [name] [name] is back regarding her fibromyalgia and migraine headaches. She has had  difficulties with anxiety and increased heart rate with the Adderall and  thus it was stopped. Frova did not help her menstrual migraines. She had  facet blocks apparently and perhaps an SI joint block at [facility]  and these only helped for a few weeks. She rates her pain as a 6-7/10. She  uses 5-hydroxytryptophan supplements for low serotonin levels and energy.  She feels that since she has come off of the Adderall that her energy levels  have been low and she has been rather depressed. Sleep has been poor as  well. She uses 2 melatonin over-the-counters at night. Pain generally  increases with any type of activity, particularly standing, bending and  sitting for prolonged periods of time.   REVIEW OF SYSTEMS:  The patient reports numbness, trouble walking, spasms,  dizziness, confusion, depression/anxiety, easy bleeding, labile sugars,  occasional constipation. Other pertinent positives as listed above. Full  review is in the health and history section of the chart.   SOCIAL HISTORY:  Is without significant change today per patient.   PHYSICAL EXAMINATION:  Blood pressure is 120/70, pulse is 90, respiratory  rate is 16. She is satting 98% on room air. The patient appears a bit flat,  in no acute distress. She has some tightness in the lumbar paraspinals  today. Facet maneuvers were positive. She had some discomfort with flexion.  Patrick's test was equivocal to negative. Compression test was negative.  Prone extension of the lumbar spine was very provocative for pain today. She  did have referral of her pain into the buttock and thigh, but inconsistently  so.  HEART: Was regular.  CHEST: Was clear.  ABDOMEN: Soft and nontender.  Patient with multiple tender points as usual today.   ASSESSMENT:  1. Fibromyalgia syndrome associated with severe migraine headaches.  2. Insomnia.  3. Depression and chronic fatigue.  4. Likely lumbar facet arthropathy at L5-S1 on the right.   PLAN:  1. Will refrain from adding another stimulant. I did recommend over-the-      counter Co-Enzyme E1  (NADH) as well as Co-Enzyme Q.  1. Recommended low-dose Lyrica retrial at 25 mg nightly, titrating up to      b.i.d. if possible.  2. Consider Ribose supplementation as before.  3. Consider followup facet blocks, perhaps radiofrequency ablation.  4. Stress core muscle strengthening, range of motion and flexion exercises      for back.  5. Continue analgesic cream of 2% baclofen and 2% Elavil and 20%      ketoprofen.  6. Increase melatonin to 3 tablets at nighttime if tolerated.  7. Have the patient follow up in the next few weeks time for Botox      injections for migraine symptoms as these      have proven more effective than anything else.  8. I will see the patient back pending scheduling.      Ranelle Oyster, M.D.  Electronically Signed     ZTS/MedQ  D:  09/10/2006 16:01:08  T:  09/10/2006 19:20:17  Job #:  [id number]

## 2011-03-09 NOTE — Procedures (Signed)
NAMELACINDA, CURVIN                 ACCOUNT NO.:  [id number]   MEDICAL RECORD NO.:  [id number]          PATIENT TYPE:  REC   LOCATION:  [facility]                          FACILITY:  [facility]   PHYSICIAN:  Erick Colace, M.D.DATE OF BIRTH:  18-Aug-1976   DATE OF PROCEDURE:  05/24/2005  DATE OF DISCHARGE:                                 OPERATIVE REPORT   MEDICAL RECORD NUMBER:  [id number].   PROCEDURE:  Botulinum toxin injection.   INDICATIONS:  Migraine headache with muscle spasms, 728.85. Chronic  headaches, 784.0.   Botulinum toxin was diluted, 1 cc in 100 units. Areas were marked and  prepped with Betadine 5 units into the procerus, 5 units in the right and  left corrugator supercilia, 5 units into 2 spots into the left frontalis and  2 spots in the right frontalis, 10 units x1 in the right temporalis, 25  units x1 in the right temporalis another spot and in the left 25 units in  the left temporalis and 5 units in another spot in the left temporalis. The  patient tolerated the procedure well. Post injection instructions given. She  will follow up with Dr. Riley Kill who will see her for her myofascial  pain/fibromyalgia and migraines and decide on further botulinum toxin  treatments in addition to other treatment strategies.      Erick Colace, M.D.  Electronically Signed     AEK/MEDQ  D:  05/24/2005 17:07:16  T:  05/25/2005 11:17:44  Job:  [id number]

## 2011-03-09 NOTE — Assessment & Plan Note (Signed)
MEDICAL RECORD NUMBER:  [id number].   [name] [name] is here for her fibromyalgia and headaches. She felt that the Ortho-  Evra patch actually exacerbated her headaches the one time she used it. The  Frova does not seem to be working as well. Flexeril helped her sleep, but  she felt groggy in the morning with this. Provigil was tried and provided  minimal benefit. She did not feel that it helped her focus anyway. 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 The patient has received some periodic  acupuncture and manipulation with fair results. She wears her Lidoderm  patches with fair results for her tender points.   The patient reports her pain as a 6 to 7/10, describes it interfering with  her general activity and relations with others and enjoyment of life on a  moderate level. Pain is described as dull, stabbing, constant, aching. It  worsens with sitting and general activities. Pain is persistent really  throughout the day.   REVIEW OF SYSTEMS:  The patient reports confusion, depression, anxiety. She  came off the SSRI and feels that her mood has been affected. She also  complains of nausea, diarrhea, constipation. Other review of systems items  are in the health and history section of the chart.   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   PHYSICAL EXAMINATION:  Blood pressure is 116/67, pulse is 100, respiratory  rate is 16. She is saturating 97% on room air. The patient is pleasant in no  acute distress. She is alert and oriented x3. Affect is flat. Gait is  stable. She has a knot in the right mid trapezius muscle which was tender to  palpation. She also had some prominence of the C3 spinous process and  vertebra. The patient had fair flexion and extension and rotation and  bending of the neck today. Cognitively, she is generally appropriate. She  continues to have  multiple trigger points throughout. Motor function is 5/5.  Sensory exam is normal. Cranial nerve exam was intact.   ASSESSMENT:  1.  Fibromyalgia syndrome associated with migraine headaches, residual      cystitis, myofascial pain, irritable bowel syndrome, temporomandibular      joint pain, daytime fatigue and insomnia with depression.  2.  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   PLAN:  1.  Frontal sinus certainly could play a role in initiating her headaches,      particularly her frontal headaches that she has. She had seen in the ENT      in the past regarding the tumor and associated sinus complications. It      may be worthwhile to refer her to someone here locally for evaluation. I      will discuss this again with her in a week or two when I see her back      for Botox injections.  2.  Initiate a trial of Namenda titrating up to 10 mg b.i.d.  3.  Will use Imitrex for breakthrough headache pain.  4.  Initiate a trial of Cymbalta 30 mg daily for 1 week and then up to 60 mg      daily.  5.  Consider another stimulant.  6.  Will allow the patient to take her time initiating the Flexeril. I think      that  if she can be on this a few days this      may ultimately help with her headache and spasm.  7.  At next visit, we will inject the frontalis muscles and perhaps masseter      muscles with Botox totaling 100 units.      Ranelle Oyster, M.D.  Electronically Signed     ZTS/MedQ  D:  08/10/2005 12:54:49  T:  08/10/2005 16:35:58  Job #:  [id number]

## 2011-03-09 NOTE — Op Note (Signed)
NAME:  [name] [name], [name] [name]                           ACCOUNT NO.:  [id number]   MEDICAL RECORD NO.:  [id number]                   PATIENT TYPE:  AMB   LOCATION:  [facility]                                 FACILITY:  [facility]   PHYSICIAN:  Lindaann Slough, M.D.               DATE OF BIRTH:  Jan 24, 1976   DATE OF PROCEDURE:  04/28/2004  DATE OF DISCHARGE:                                 OPERATIVE REPORT   PREOPERATIVE DIAGNOSES:  Pelvic pain, interstitial cystitis.   POSTOPERATIVE DIAGNOSES:  Pelvic pain, interstitial cystitis.   PROCEDURE:  Cystoscopy, hydraulic bladder distention, ureteral dilation.   SURGEON:  Lindaann Slough, M.D.   ANESTHESIA:  General.   INDICATIONS FOR PROCEDURE:  The patient is a 35 year old female who has been  complaining of pelvic pain on and off for the past five years. The symptoms  have been getting worse during the past year. A potassium test is positive.  She is scheduled today for cystoscopy and hydraulic bladder distention.   Under general anesthesia, the patient was prepped and draped and placed in  the dorsal lithotomy position. A #22 Wappler cystoscope was inserted in the  bladder. The bladder mucosa is reddened.  There is no stone or tumor in the  bladder. The ureteral orifices are in normal position and shape with clear  efflux.  There is minimal submucosal hemorrhage.  The bladder was distended  for about 10 minutes and the bladder capacity is about  900 mL.  The  cystoscope was then removed. The urethra was dilated with a #30 [place].  The  bladder was then emptied and 400 mg of Pyridium and 15 mL of Marcaine were  instilled in the bladder.   The patient tolerated the procedure well and left the OR in satisfactory  condition to post anesthesia care unit.                                               Lindaann Slough, M.D.    MN/MEDQ  D:  04/28/2004  T:  04/28/2004  Job:  [id number]   cc:   Hal Morales, M.D.  Fax: [phone]

## 2011-03-09 NOTE — Assessment & Plan Note (Signed)
INTERVAL HISTORY:  [name] [name] is here in followup of her fibromyalgia and  headaches.  She has had some worsening of her headaches, especially recently  with the weather change and her period.  She feels that headaches are worst  with the drop in her estrogen immediately after menstruation.  She rates her  overall pain at a 6 to 7 out of 10.  She is also having pain in the neck,  shoulders, back and legs.  The neck pain is more of a general diffuse type.  She has had a few focal tender points from time to time.  She tried the  Rozerem and felt somewhat of a hangover with the pills so she stopped taking  these.  She did begin the magnesium daily.  This may have somewhat helped  with her muscle energy.  She felt the Frova was somewhat more effective for  her migraine rescue than the Relpax but did not have prescription nor had  called for one to be filled.  She is attempting to go back to school to get  her degree.  She would like to get into acupuncture.   Patient describes her pain as sharp, dull, aching and constant and  interferes with general activity, relations with others and enjoyment of  life on a moderate level.   REVIEW OF SYSTEMS:  Patient reports some urinary frequency, depression,  anxiety, somewhat decreased sleep, daytime fatigue and problems with  concentration.  Other pertinent positives are listed above.  Full review of  systems is in the health and history portion of the chart.   SOCIAL HISTORY:  Pertinent positives listed above.  Patient is single.   PHYSICAL EXAMINATION:  Blood pressure is 120/59, pulse is 98, respiratory  rate is 16, she is saturating 98% in room air.  Patient is pleasant, no  acute distress.  She is a bit reserved in affect but generally appropriate.  Gait is stable.  Coordination is fair.  She has good posture.  I felt no  focal trigger points today.  She has some generalized tender areas  throughout the shoulders and neck.  No clicking in the jaw was  noted.  She  had good neck posture in general.  Strength is about 5/5 throughout.  Sensory exam is grossly intact.  Cranial nerve exam was unremarkable.  Sensory exam is normal.  Other provocative maneuvers were equivocal to  negative today.   ASSESSMENT:  Fibromyalgia syndrome with associated migraine headaches and  residual cystitis, myofascial pain, irritable bowel syndrome,  temporomandibular joint, daytime fatigue and insomnia with depression.   PLAN:  1.  I would like to focus on the menstrual reactivity of her migraines.  We      started her on an Ortho Evra patch which she will take 3 days before her      period and continue for the duration of the patch (1 week).  We will use      Frova 2.5 mg for rescue treatment of such headaches.  2.  Consider Namenda trial.  3.  Consider calcium channel blocker or beta blocker for headaches.  4.  We will add Flexeril 5 mg for sleep.  She may try to increase this      slowly up to 20 mg as tolerated depending on her sleep habits.  5.  Consider Botox injections to the head once again.  6.  We discussed her sleep study results and found no focal sleep apnea.  She did have some restless sleep and some twitching of extremities.      Those may be helped somewhat with the Flexeril.  7.  We will try Provigil 200 mg daily to improve daytime arousal.  I would      like her to hold off on taking these for      2 weeks until we see how she does with the previous medication changes.  8.  I will see her back in 1 month's time.      Ranelle Oyster, M.D.  Electronically Signed     ZTS/MedQ  D:  07/13/2005 16:10:01  T:  07/14/2005 13:03:33  Job #:  [id number]

## 2011-03-09 NOTE — Assessment & Plan Note (Signed)
DATE OF VISIT:  October 27, 2004.   MEDICAL RECORD NUMBER:  [id number].   DATE OF BIRTH:  03-27-1976.   The patient was last seen by me on August 28, 2004, when I performed EMG-  guided botulinum toxin injection, 25 units in the right frontalis, 25 units  in the left frontalis, 30 units in the left temporalis and 20 units in the  right temporalis.   The patient states that her headache pain is significantly better since  injections.  She had remarkable improvement for one month and then it  tapered off over the last several weeks.   She notes some tenderness in the right mass in her area greater than left.   The patient has had no new medication complications over the last month.  She did not take her nortriptyline because she was afraid about the side  effects once she read up on it.   We talked more about the nortriptyline pros and cons, as well as the common  side effects.   PHYSICAL EXAMINATION:  Generally in no acute distress.  Mood and affect  appropriate.  Blood pressure 113/61, pulse 82, respirations 20, O2  saturation 99% on room air.   The pain rating score is currently 7/10.  Made worse with repetitive motion  of arms and jaw.   Cranial nerves II-XII are intact.  She had mild tenderness to palpation over  the right masseter muscle just anterior to the mandible.   She has full neck range of motion.  She has normal mood and affect.  Normal  gait.   IMPRESSION:  1.  Migraine-type headaches.  Prophylact with botulinum toxin.  2.  Multiple spasms in the vascular muscle.   PLAN:  1.  Will repeat botulinum injection this time.  Will divide toxin into more      injection sites and will include masseter, __________, supraciliary and      procerus.  Will see her back in two months for this.  2.  I talked about nortriptyline with her and she agrees to take this.      Andr   AEK/MedQ  D:  10/27/2004 13:46:49  T:  10/27/2004 14:42:02  Job #:  [id number]

## 2011-03-09 NOTE — Procedures (Signed)
[name] [name], [name] [name]                 ACCOUNT NO.:  [id number]   MEDICAL RECORD NO.:  [id number]          PATIENT TYPE:  REC   LOCATION:  [facility]                          FACILITY:  [facility]   PHYSICIAN:  Ranelle Oyster, M.D.DATE OF BIRTH:  06-21-1976   DATE OF PROCEDURE:  05/22/2006  DATE OF DISCHARGE:                                 OPERATIVE REPORT   PROCEDURE:  Botox.   DIAGNOSIS:  Chronic migraine headaches, 346.11.   DESCRIPTION OF PROCEDURE:  After informed consent and preparation of the  skin with isopropyl alcohol, we injected 10 separate areas with Botox today.  We injected the frontalis muscle at four separate locations using 4 units of  Botox.  I injected 20 units into the procerus muscle at two separate  locations.  We additionally injected 20 units into each temporalis muscle at  two separate locations totaling 100 units of Botox when we were complete.  The patient experienced minimal side effects today.   I refilled Adderall 20 mg daily for arousal.  We also began the patient on a  trial of Lyrica 50 mg t.i.d. titrating up over two weeks' time.      Ranelle Oyster, M.D.  Electronically Signed     ZTS/MEDQ  D:  05/22/2006 10:39:53  T:  05/22/2006 11:02:19  Job:  [id number]

## 2011-03-09 NOTE — Procedures (Signed)
[name] [name], [name] [name]                 ACCOUNT NO.:  [id number]   MEDICAL RECORD NO.:  [id number]          PATIENT TYPE:  REC   LOCATION:  [facility]                          FACILITY:  [facility]   PHYSICIAN:  Ranelle Oyster, M.D.DATE OF BIRTH:  01-22-1976   DATE OF PROCEDURE:  DATE OF DISCHARGE:                               OPERATIVE REPORT   Wednesday, October 09, 2006.   OPERATION/PROCEDURE:  Botox injection 346.11 for refractory migraine  headaches.   DESCRIPTION OF PROCEDURE:  After informed consent and preparation of the  skin with isopropyl alcohol, we injected head and facial muscles at  several locations.  I used 20 units of proximally in both frontalis  regions, left and right.  We injected the procerus area 20 units of  Botox. We injected each masseter muscle with approximately 20 units of  Botox.  The patient tolerated without complaints and minimal bleeding  was incurred.   The patient has been having problems with fatigue with Lyrica so we  added back once again the Adderall at low dose, 5 mg, q.a.m. We will see  show she does with this.  May need to stop Lyrica due to a swelling and  sleepiness.  Also I refilled her Omnicor today as well.  The patient  will continue on her supplementation including the lab dose which she  just initiated.      Ranelle Oyster, M.D.  Electronically Signed     ZTS/MEDQ  D:  10/09/2006 10:54:03  T:  10/09/2006 13:34:04  Job:  [id number]

## 2011-03-09 NOTE — Assessment & Plan Note (Signed)
HISTORY OF PRESENT ILLNESS:  [name] [name] is back regarding her fibromyalgia and  headaches.  She has had good results with the Botox injections, although,  effects usually last for 2-3 months, and she seems to be having increased  headaches once again as we get to the end of that three month period.  She  is followed by Dr. Molli Barrows, ENT, and he told her that she had a right  maxillary sinusitis and recommended nasal sprays.  He suggested that she may  benefit for a turbinate reducing surgery.  She is to follow with him  apparently next month.  She has tried sprays and saline before in the nose  without benefit.  A lot of times, the sprays caused her headaches to  increase.  The patient has tolerated the Wellbutrin for the most part.  She  is on 150 mg in the morning and 75 mg at bedtime.  She has had more  difficulties with the bedtime dose as these prevent sleep.  Adderall was not  tolerated due to side effects.  Today, the patient rates her pain at a 6/10.  Describes it as dull, stabbing, constant and sharp.  She has had more pain  in the neck as well as the right low back.  Pain interferes with general  activity, relationship with others, enjoyment of life on a moderate to  severe level.  Sleep is poor.  She still lacks general energy during the  day.   She handed me a lab report from 2004, looking at urine neurotransmitters and  seemed to have some deficiencies in her epinephrine as well as serotonin  specifically.   CURRENT MEDICATIONS:  1.  Adderall  b.i.d.  2.  Wellbutrin as noted above.  3.  Imitrex p.r.n.  4.  Multivitamin.   REVIEW OF SYSTEMS:  The patient reports numbness, tingling, anxiety,  confusion, depression, diarrhea, constipation.  Other pertinent positive as  listed above and are in the Health and History section.   SOCIAL HISTORY:  Without change.  The patient is still involved in  schooling.   PHYSICAL EXAMINATION:  VITAL SIGNS:  Blood pressure 125/76, pulse  97,  respiratory rate 16, saturating 100% on room air.  GENERAL APPEARANCE:  The patient is pleasant, in no acute distress.  She is  a bit quite but in her usual state and frame of mind. Gait is stable.  Coordination and reflexes are 2+.  HEART:  Regular rate and rhythm.  LUNGS:  Clear.  ABDOMEN:  Soft, nontender.  NECK/BACK:  Not examined at length today.  NEUROLOGICAL:  Motor function as 5/5.   ASSESSMENT:  1.  Fibromyalgia syndrome.  2.  Persistent sinusitis.  3.  Myofascial neck pain.  4.  Chronic migraine/myofascial headaches.  5.  Depression and chronic fatigue.   PLAN:  1.  We will change Wellbutrin to 150 mg XL q.a.m.  Hopefully, we will avoid      some of the evening anxiety stimulation.  2.  Will try half dose Rozerem 4 mg one p.o. q.h.s.  She will purchase a      pill cutter to assist with this.  3.  Await ENT follow up.  4.  Set patient up for Botox injections of the frontalis muscle consisting      of 10 units total, and next month she will be approximately 3 to 3-/12      months out at that point.  5.  I gave the patient a prescription for magnesium  gluconate 150 mg daily.  6.  Recommended other topical analgesics.  We also discussed Ribose      supplementation.  The patient continues on an organic diet, avoids      preservatives.  7.  I will see the patient back pending scheduling of her Botox.      Ranelle Oyster, M.D.  Electronically Signed     ZTS/MedQ  D:  11/14/2005 11:22:05  T:  11/14/2005 13:48:35  Job #:  [id number]

## 2011-03-09 NOTE — Procedures (Signed)
[name] [name], [name] [name]                 ACCOUNT NO.:  [id number]   MEDICAL RECORD NO.:  [id number]          PATIENT TYPE:  REC   LOCATION:  [facility]                          FACILITY:  [facility]   PHYSICIAN:  Ranelle Oyster, M.D.DATE OF BIRTH:  1976-01-23   DATE OF PROCEDURE:  09/03/2005  DATE OF DISCHARGE:                                 OPERATIVE REPORT   DIAGNOSIS:  Refractory migraine headaches, 346.11.   PROCEDURE:  Botox injections.   DESCRIPTION OF PROCEDURE:  After informed consent and preparation with  isopropyl alcohol, we injected the bilateral frontalis muscles and the  procerus muscle as well as bilateral temporalis muscles using 100 units of  botulinum toxin total.  I placed approximately 30 units into each frontalis  muscle at three separate sites.  We injected 10 units into the procerus  muscle and approximately 15 units into each temporalis muscle.  Aspiration  technique was used.  The patient tolerated it well with minimal bleeding  complications.  Post-injection instructions were given.   I did give the patient a prescription for Imitrex at increased dose, 100 mg  daily p.r.n.  She will hold off on her Namenda for now.  Will stay with  Cymbalta 60 mg daily despite some mental fogging.   I will see the patient back in about six weeks' time.      Ranelle Oyster, M.D.  Electronically Signed     ZTS/MEDQ  D:  09/03/2005 12:47:11  T:  09/04/2005 08:21:34  Job:  [id number]

## 2011-03-09 NOTE — Assessment & Plan Note (Signed)
[name] [name] is back regarding her fibromyalgia syndrome and headaches.  She had  good results with the Botox injections in November until she had recent  sinus problems over the last week or two.  She has an ENT appointment  scheduled this week to follow up her sinus problems.  Her sinuses seemed to  definitely initiate a lot of her frontal and temporal headaches.  She  reports her pain as 6 out of 10 in general.  She describes the pain as dull,  stabbing, and aching, involving the head, neck, and low back.  Pain  interferes with general activity, relationships with others, enjoyment of  life on a 7 out of 10 level.  We tried her on Cymbalta and she had increased  pain and more depression, so she stopped the medication.  She had difficulty  getting feedback from our office regarding this medication problem, so she  stopped the medication herself.  We discussed multiple antidepressants that  she has been on over the years and the one that seemed to work the best for  her was Wellbutrin combined with a stimulant.   The patient remains on Imitrex p.r.n. as well as Omnicor b.i.d.   REVIEW OF SYSTEMS:  The patient denies any new neurologic, psychiatric,  constitutional, GU, GI, or cardiorespiratory complaints other than those  mentioned above.   SOCIAL HISTORY:  The patient completed her school for the semester and will  go back in early January.   PHYSICAL EXAMINATION:  VITAL SIGNS:  Blood pressure is 96/50, pulse 84,  respiratory rate 16, she is saturating 97% on room air.  GENERAL:  The patient is pleasant, a bit flat in her affect, but generally  appropriate.  Gait is stable.  Coordination is fair.  Reflexes are 2+.  Sensation is normal.  HEART:  Regular rate and rhythm.  LUNGS:  Clear.  ABDOMEN:  Soft and nontender.  NEUROLOGY:  The patient had some tightness in the bilateral trapezius  muscles, right more so than the left today.  There was some pain in the SI  joint regions, but no  focal muscle spasm noted.  Back range of motion was  fair today.  She had preserved strength.  Neurologically, she generally was  appropriate today.   ASSESSMENT:  1.  Fibromyalgia syndrome.  2.  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  3.  Myofascial neck pain.  4.  Depression and chronic fatigue.   PLAN:  1.  We will initiate a trial of Wellbutrin 75 mg b.i.d. for the first week      and then 150 mg b.i.d. thereafter.  2.  We will also add Adoral 10 mg daily for energy stimulation.  3.  We will await ENT report at her outpatient visit.  4.  After informed consent, we injected two trapezius trigger points using 2      mL of 1% lidocaine.  The patient tolerated these well.  5.  Consider follow-up Botox injections in the future.  6.  I will see the patient back in about a months' time.     Ranelle Oyster, M.D.  Electronically Signed    ZTS/MedQ  D:  10/17/2005 11:19:04  T:  10/17/2005 12:07:25  Job #:  [id number]

## 2011-03-09 NOTE — Assessment & Plan Note (Signed)
INTERVAL HISTORY:  [name] [name] is back regarding her migraines and fibromyalgia.  She has been doing well with her headaches since Botox injection.  She has  had a bit of a flare with her recent menstrual period which seems to be the  case most days.  She has had some low back pain and apparently she is going  for facet blocks in the next 2 weeks at [facility].  The patient  rates her pain in general 6-7/10, describes it as sharp, dull, stabbing and  aching.  She finds that she does not need a full 20 mg of the Adderall and  so some days is trying to take just half the amount.  Sleep is fair with the  melatonin at nighttime.  She also uses 5-hydroxytryptophan supplements for  her low serotonin levels.  The patient states pain is an issue with  walking, bending, sitting, standing.  The pain improves with rest, heat and  injections that we have performed here.   REVIEW OF SYSTEMS:  Positive for bladder control problems, weakness,  numbness, anxiety, depression, confusion.  Other pertinent issues are  mentioned above.   SOCIAL HISTORY:  The patient's father was just recently moved to a local  nursing home.  Apparently he suffered a severe brain injury.   PHYSICAL EXAMINATION:  Blood pressure is 99/56, pulse is 87, respiratory  rate 16.  She is saturating 98% in room air.  The patient is pleasant and a  bit quiet but in no acute distress.  She is alert and oriented x3.  She  continues to have some tightness over the lumbar paraspinal muscles as well  as the neck.  Posture was fair.  Heart was regular.  Chest was clear.  Abdomen soft, nontender.  Coordination was normal.  Reflexes are 2+.  Sensation is within normal limits.  Motor function is 5/5.   ASSESSMENT:  1. Fibromyalgia syndrome with refractory severe migraine headaches.  These      migraines seem to be at least partial hormonally mediated.  2. Insomnia.  3. Depression and chronic fatigue.   PLAN:  1. We will adjust Adderall  XL to 10 mg one to two daily (#60) were written      today.  2. Continue exercise/range of motion.  She may receive acupuncture as      able.  3. Consider ribose supplementation 5 grams daily.  4. Go ahead with facet blocks through [facility].  5. We will try an analgesic cream with 2% baclofen, 2% Elavil, and 20%      ketoprofen to tender spots on neck, back and extremities.  She may      apply b.i.d.  6. Trial of Frova 2.5 to 5 mg when migraine headaches take place to see if      this effects her symptoms.  May consider estrogen patch prior to      menstrual period.  7. I will see the patient back in 3 months' time at which we will follow      up with another Botox injection for her migraine prophylaxis.      Ranelle Oyster, M.D.  Electronically Signed     ZTS/MedQ  D:  07/19/2006 13:52:30  T:  07/21/2006 20:47:28  Job #:  [id number]

## 2011-03-09 NOTE — Procedures (Signed)
NAMEKAILIANA, GRANQUIST                 ACCOUNT NO.:  [id number]   MEDICAL RECORD NO.:  [id number]          PATIENT TYPE:  OUT   LOCATION:  SLEEP CENTER                 FACILITY:  [facility]   PHYSICIAN:  Clinton D. Maple Hudson, M.D. DATE OF BIRTH:  May 14, 1976   DATE OF STUDY:  06/27/2005                              NOCTURNAL POLYSOMNOGRAM   REFERRING PHYSICIAN:  Dr. Faith Rogue.   INDICATION FOR STUDY:  Hypersomnia with sleep apnea. Epworth sleepiness  score 15/24, BMI 22. Weight 140 pounds.   SLEEP ARCHITECTURE:  Short total sleep time 255 minutes with sleep  efficiency 71%. Stage I was 2%, stage II 50%, stages III and IV 16%, REM 3%  of total sleep time. Sleep latency 31 minutes, REM latency 128 minutes,  awake after sleep onset 63 minutes, arousal index 14. Listed medications  included Rozerem but the technician did not indicate that bedtime medication  taken. Sleep onset was at 10:41 p.m.Marland Kitchen She awoke at 3 a.m. and asked to leave  at 4 a.m.   RESPIRATORY DATA:  Apnea/hypopnea index (AHI, RDI) 0.9 obstructive events  per hour which is within normal. This included 1 obstructive apnea, 3  hypopneas. Events were reported while sleeping supine and on her left side.  REM AHI of 0.   OXYGEN DATA:  Mild intermittent snoring with oxygen desaturation to a nadir  of 90%. Mean oxygen saturation through the study was 97% on room air.   CARDIAC DATA:  Sinus rhythm with mild sinus tachycardia.   MOVEMENT/PARASOMNIA:  A total of 30 limb jerks were recorded of which 9 were  associated with arousal or awakening for periodic limb movement with arousal  index of 2.1 per hour which is minimally elevated.   IMPRESSION/RECOMMENDATIONS:  1.  Short total sleep time with awakening after sleep onset, early morning      awakening and the patient requesting early discharge.  2.  Occasional sleep disordered breathing events, within normal limits,      apnea/hypopnea index 0.9 per hour with      mild intermittent  snoring mainly while supine, and normal oxygenation.  3.  Mild periodic limb movement with arousal, 2.1 per hour.      Clinton D. Maple Hudson, M.D.  Diplomate, Biomedical engineer of Sleep Medicine  Electronically Signed     CDY/MEDQ  D:  07/01/2005 10:35:37  T:  07/01/2005 23:27:48  Job:  [id number]

## 2011-03-09 NOTE — Procedures (Signed)
NAMELINDZEE, GOUGE                 ACCOUNT NO.:  [id number]   MEDICAL RECORD NO.:  [id number]          PATIENT TYPE:  REC   LOCATION:  [facility]                          FACILITY:  [facility]   PHYSICIAN:  Erick Colace, M.D.DATE OF BIRTH:  [birth year]   DATE OF PROCEDURE:  09/07/2004  DATE OF DISCHARGE:                                 OPERATIVE REPORT   MEDICAL RECORD NUMBER:  [id number].   DATE OF BIRTH:  [birth year].   PROCEDURES:  Botulinum toxin injection.   DIAGNOSIS:  Muscle spasm.  728.85.   INDICATIONS:  Chronic daily headache with two to three days' relief status  post lidocaine injection of temporalis and frontalis muscles.   Informed consent was obtained after describing the risks and benefits of the  procedure with the patient.  These included bleeding, bruising and  infection, as well as facial droop and weakness with chewing.  She elects to  proceed.   The patient was placed in a sitting position.  EMG guidance was utilized.  Dilution was 50 units per mL.   Two areas were marked over the frontalis on each side at the middle wrinkle  line, prepped with Betadine and then entered with a 27 gauge 1 inch needle  under EMG guidance.  Appropriate EMG activity was elicited with raising  eyebrows.  Then 0.5 mL was injected into each site with a total of 0.5 mL on  each side equaling 25 units in the left frontalis and 25 units in the right  frontalis.  Then the areas over the right temporalis and left temporalis  were marked and prepped with Betadine and entered with the same 27 gauge 1  inch needle.  A total of 30 units was injected in the left side and 20 units  on the right side based on much more tenderness on the left versus right  side to palpation.  This was done under EMG guidance with proper activity  with jaw clenching.   The patient tolerated the procedure well and there were no complications.  Will see her back in one month.  This can be repeated on an every  three-  month basis if providing at least 50% pain relief x 2 months.       AEK/MEDQ  D:  09/07/2004 13:10:32  T:  09/08/2004 13:45:23  Job:  [id number]

## 2011-03-09 NOTE — Assessment & Plan Note (Signed)
[facility]                         GASTROENTEROLOGY OFFICE NOTE   NAME:[name] [name], [name] [name]                        MRN:          [id number]  DATE:01/21/2007                            DOB:          1976-09-24    CHIEF COMPLAINT:  I'm here for follow-up.   HISTORY:  Vollie had a colonoscopy that showed a 5 mm rectal adenoma.  She had internal hemorrhoids, no evidence of Crohn's or inflammatory  bowel disease.  Since that time she is moving her bowels about 4 times a  week but still has incomplete defecation and does not feel like that is  enough.  Protonix is controlling her heartburn and reflux problems.  She  is not having any rectal bleeding.  She took Miralax once a day, it did  not seem to help, and does not sound like she is interested in trying to  increase the dose.  I reviewed all this with her.  She also had a workup  from Dr. Pennie Rushing.  She had an ultrasound and there was the suggestion of  a dermoid cyst.  She ended up having a CT of the abdomen and pelvis that  showed some small lesions consistent with hemangiomas in the liver, one  5 mm and one 10 mm.  There was a 6.3 x 6.3 x 5.1 cm mass most consistent  with a fibroid arising from the right side of the dome of the uterus.  Overall her abdominal pain is better.  She has not really felt like she  needed to use Levsin.   MEDICATIONS:  Listed and reviewed in the chart.   Vital signs show weight 142 pounds, pulse 72, blood pressure 98/60.   ASSESSMENT AND PLAN:  1. Irritable bowel syndrome, constipation-dominant.  Will try Amitiza      samples.  She is not trying to get pregnant.  She just finished her      period and she understands about the risk of spontaneous abortion      with this medication.  She also understands about the possibility      of nausea.  Samples and a prescription are sent.  2. She has a slightly low ferritin of 10.  She is not anemic.  She has      fatigue.  We will try  Tandem Iron for one month.  Samples are given      to see if that boosts her ferritin and see if she can tolerate      that.  3. Adenomatous colon polyp.  Recall colonoscopy for 5 years.  4. Gastroesophageal reflux disease.  Continue Protonix.  5. Gynecologic issues:  She will follow up with Dr. Pennie Rushing.   Note:  She will follow up with me as needed at this point.     Iva Boop, MD,FACG  Electronically Signed    CEG/MedQ  DD: 01/21/2007  DT: 01/22/2007  Job #: [id number]   cc:   Hal Morales, M.D.

## 2011-03-09 NOTE — Procedures (Signed)
NAMESERA, HITSMAN                 ACCOUNT NO.:  [id number]   MEDICAL RECORD NO.:  [id number]          PATIENT TYPE:  REC   LOCATION:  [facility]                          FACILITY:  [facility]   PHYSICIAN:  Erick Colace, M.D.DATE OF BIRTH:  [birth year]   DATE OF PROCEDURE:  12/07/2004  DATE OF DISCHARGE:                                 OPERATIVE REPORT   MEDICAL RECORD NUMBER:  [id number].   PROCEDURE:  Botulinum toxin injection.   INDICATIONS:  Migraine headache and muscles spasms, 728.85. Chronic  headache, 784.0.   Botulinum toxin was diluted, 15 per cc, 100 units total injected. The areas  marked and prepped with alcohol, 5 units into the procerus, 5 units into the  right and left corrugator supercilia, 5 units into 2 spots in the left  frontalis and 2 spots in the right frontalis, 10 units x1 in the right  temporalis and an additional 25 units in another spot in the right  temporalis and on the left side 25 units in 1 spot and 5 units at the other  spot. The patient tolerated the procedure well. Postinjection instructions  given. I will see her back in 6 weeks in followup.      AEK/MEDQ  D:  12/07/2004 16:23:21  T:  12/08/2004 07:44:14  Job:  [id number]

## 2011-03-09 NOTE — Assessment & Plan Note (Signed)
[name] [name] is back regarding her fibromyalgia syndrome.  We performed Botox  last December, which she did not feel were as helpful, at least worked  as quickly as they had in the past.  We injected the areas that we  treated previously.  She did note that her headaches are exacerbated by  her menstrual period currently.  She overall rates her pain a 5 to 6 out  of 10.  She notes continued fatigue.  She feels that Lyrica will make  her feel tired and hung over to a certain extent.  She is only able to  tolerate it at night time.  She is taking 50 mg at night.  She is taking  her Adderall inconsistently.  She is on 5 hydroxy tryptophan  supplementation for her mood and seems to tolerate these supplements  without any issues.  She is on 100 mg daily.  I am not sure how that  equates to a typical SSRI medication.  She has had some problems again  with her back, with radiation of the pain into the buttock and  posterolateral thigh and calf.  She was seen by orthopedic surgery, who  felt this may be related to a T12-L1 problem.  I do not have the MRI to  review today in the office.  The patient continues to take multiple  supplements, which she brought with her today, which she states help  somewhat with energy and general wellness.   REVIEW OF SYSTEMS:  Notable for some numbness along the right leg,  though has trouble walking as a result.  She states the pain seems to  affect her walking.  She does report depression, confusion, and some  anxiety as well as intermittent constipation.  Full review is in the  health and history section of the chart.   SOCIAL HISTORY:  Is without significant change today.   PHYSICAL EXAMINATION:  Blood pressure is 107/68.  Pulse is 100.  Respiratory rate is 16.  She is satting 99% on room air.  The patient is pleasant and in no acute distress.  She is alert and oriented times 3.  Affect is generally appropriate.  The patient had some discomfort with ambulation  today in the right leg.  I was unable to appreciate any focal weakness in the right leg, except  perhaps at the right EHL, although this is minimally weak.  Reflexes  were 1+ equally on the other leg today.  No provocative maneuvers were  tested today.  HEART:  Was regular rate.  CHEST:  Was clear.  ABDOMEN:  Soft and nontender.   ASSESSMENT:  1. Fibromyalgia syndrome with associated migraine headaches.  2. Insomnia.  3. Depression and chronic fatigue.  4. Questionable lumbar facet arthropathies L5-S1 on the right.  This      does not explain right leg radicular symptoms.  Certainly T12 or L1      radiculopathy would not cause these type of complaints.  Again, I      do not have her MRI to review today.   PLAN:  1. Continue low-dose Adderall 5 mg XR 1 to 2 daily.  2. She will continue low-dose Lyrica as tolerated 25 to 50 mg nightly.  3. Recommend supplementation as previously mentioned.  I have examined      several of her supplements today, which I do not have any obvious      problems with.  She will have to try and observe for positive  effects.  4. Recommended core and low back strengthening, range of motion      exercises.  5. I will see the patient back in about 3 months' time.      Ranelle Oyster, M.D.  Electronically Signed     ZTS/MedQ  D:  11/06/2006 12:25:07  T:  11/06/2006 15:23:20  Job #:  [id number]

## 2011-03-09 NOTE — Procedures (Signed)
CLINICAL INFORMATION:  This 35 year old white female was oriented and  cooperative, complained of pounding headache from the back of her head.  She  has had an episode of vomiting, that she has been vomiting, for two or three  days.  She has been feeling faint but never fainted.   TECHNICAL DESCRIPTION:  This EEG was recorded during the awake state.  The  background activity was 12 to 14 Hz rhythms and higher amplitude seen at  posterior head regions bilaterally.  Eye opening and eye closing maneuver  showed the appropriate response on the background activity.  Hyperventilation testing produced no significant abnormalities.  Photic  stimulation produced no significant abnormalities.  There is no evidence of  any stage II sleep or epileptiform activity or focal asymmetry present.   IMPRESSION:  This is a normal EEG during the awake and drowsy stage.    Evie Lacks, M.D.   ZOX:WRUE  D:  02/25/2004 11:50:05  T:  02/25/2004 12:49:18  Job #:  [id number]
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
## 2011-06-19 ENCOUNTER — Ambulatory Visit: Payer: Medicare Other | Admitting: Physical Medicine & Rehabilitation

## 2011-07-03 ENCOUNTER — Encounter: Payer: Medicare Other | Attending: Physical Medicine & Rehabilitation | Admitting: Physical Medicine & Rehabilitation

## 2011-07-03 DIAGNOSIS — IMO0001 Reserved for inherently not codable concepts without codable children: Secondary | ICD-10-CM | POA: Insufficient documentation

## 2011-07-03 DIAGNOSIS — F3289 Other specified depressive episodes: Secondary | ICD-10-CM | POA: Insufficient documentation

## 2011-07-03 DIAGNOSIS — M545 Low back pain, unspecified: Secondary | ICD-10-CM | POA: Insufficient documentation

## 2011-07-03 DIAGNOSIS — G43909 Migraine, unspecified, not intractable, without status migrainosus: Secondary | ICD-10-CM | POA: Insufficient documentation

## 2011-07-03 DIAGNOSIS — F329 Major depressive disorder, single episode, unspecified: Secondary | ICD-10-CM | POA: Insufficient documentation

## 2011-07-03 DIAGNOSIS — G4700 Insomnia, unspecified: Secondary | ICD-10-CM

## 2011-07-03 DIAGNOSIS — M542 Cervicalgia: Secondary | ICD-10-CM | POA: Insufficient documentation

## 2011-07-03 DIAGNOSIS — G43019 Migraine without aura, intractable, without status migrainosus: Secondary | ICD-10-CM

## 2011-07-03 DIAGNOSIS — M5380 Other specified dorsopathies, site unspecified: Secondary | ICD-10-CM

## 2011-07-03 NOTE — Assessment & Plan Note (Signed)
HISTORY:  [name] [name] is back regarding her fibromyalgia and headaches.  We performed Botox in March and she has had good results with these until recently.  She has more headaches as of late.  She is getting a chiropractor aid for her neck and shoulders and this helps, but seems only work while she is actively receiving it.  She is working on diet and exercises and this is helping her general fibromyalgia symptoms. Her pain is about 6/10.  REVIEW OF SYSTEMS:  Notable for the above.  Full 12-point review is in the written health and history section of the chart.  SOCIAL HISTORY:  Unchanged.  She still raising a family and seems to be very happy with that.  PHYSICAL EXAMINATION:  VITAL SIGNS:  Blood pressure 109/56, pulse 74, respiratory rate 18, and she is satting 98% on room air. GENERAL:  The patient is pleasant and alert.  Affect showing bright and appropriate.  The patient bit photophobic today. NECK:  Somewhat tender with flexion more than extension.  There is some diffuse myofascial tenderness over the cervical and cervical musculature as well as shoulder girdle.  HEART:  Regular. CHEST:  Clear. ABDOMEN:  Soft, nontender.  Weight is stable.  ASSESSMENT: 1. Migraine headaches. 2. Fibromyalgia syndrome. 3. Depression. 4. Chronic low back pain and cervicalgia.  The patient with myofascial     and spondylosis components.  PLAN: 1. I think she  would benefit from a trial of home cervical traction.     We will look into getting her trial unit to use at home. 2. We will set the patient on Botox injections again 100 units to the     temporalis frontalis and procerus regions.  We will utilize 100     units total. 3. We will try Frova breakthrough migraines 2.5 mg daily p.r.n. 4. We will replace her Demerol and Norco with Dilaudid 4 mg half to     one daily p.r.n. severe headache. 5. Continue with diet as she has been doing as well as     supplementations. 6. I will see her back  pending injections above.     Ranelle Oyster, M.D. Electronically Signed    ZTS/MedQ D:  07/03/2011 11:20:34  T:  07/03/2011 15:41:04  Job #:  [id number]

## 2011-07-20 LAB — CBC
HCT: 31.7 — ABNORMAL LOW
MCHC: 34.1
MCV: 89.2
Platelets: 206
RDW: 13.3

## 2011-07-20 LAB — STREP B DNA PROBE

## 2011-07-20 LAB — URIC ACID: Uric Acid, Serum: 3.2

## 2011-07-20 LAB — COMPREHENSIVE METABOLIC PANEL
Albumin: 3 — ABNORMAL LOW
BUN: 4 — ABNORMAL LOW
Calcium: 9
Creatinine, Ser: 0.42
Total Bilirubin: 0.2 — ABNORMAL LOW
Total Protein: 5.6 — ABNORMAL LOW

## 2011-07-23 LAB — URINALYSIS, ROUTINE W REFLEX MICROSCOPIC
Bilirubin Urine: NEGATIVE
Ketones, ur: NEGATIVE
Nitrite: NEGATIVE
pH: 6.5

## 2011-07-24 LAB — CBC
HCT: 32.7 — ABNORMAL LOW
Hemoglobin: 11.2 — ABNORMAL LOW
MCHC: 33.2
MCHC: 34.1
MCV: 87.7
Platelets: 192
RDW: 14.7
RDW: 14.8
WBC: 14.1 — ABNORMAL HIGH

## 2011-07-24 LAB — CCBB MATERNAL DONOR DRAW

## 2011-08-21 ENCOUNTER — Encounter: Payer: Medicare Other | Attending: Physical Medicine & Rehabilitation | Admitting: Physical Medicine & Rehabilitation

## 2011-08-21 DIAGNOSIS — G43019 Migraine without aura, intractable, without status migrainosus: Secondary | ICD-10-CM

## 2011-08-21 NOTE — Procedures (Signed)
[name], [name] [name]                 ACCOUNT NO.:  [id number]  MEDICAL RECORD NO.:  [id number]           PATIENT TYPE:  O  LOCATION:  PRM                          FACILITY:  [facility]  PHYSICIAN:  Ranelle Oyster, M.D.DATE OF BIRTH:  15-Feb-1976  DATE OF PROCEDURE:  08/21/2011 DATE OF DISCHARGE:                              OPERATIVE REPORT  NAME OF PROCEDURE:  Botox injection.  DIAGNOSTIC CODE:  346.11.  Common migraines intractable.  After informed consent, we prepared the skin of the scalp and forehead with isopropyl alcohol.  We injected the left temporal area with 2 separate injections each consisting of 10 units of Botox.  We injected the right front talus muscle at 3 locations, the teres muscle at 2 separate locations as well as left frontalis muscle at 2 locations.  We injected the left temporal area with Botox at 2 locations as well.  All these locations of injections, where with 10 units of Botox diluted potentially in a concentration of 100 units per 2 mL of preservative- free normal saline.  The patient tolerated well with minimal bleeding. She was given post injection instructions.  All areas were cleaned after injection.  I did give the patient a prescription for Dilaudid for breakthrough severe migraine headaches.  She will use 4-8 mg daily p.r.n. severe headaches #40 were dispensed.  I did instruct her that these were not to be used on a daily basis, but only for severe breakthrough headaches. For now, we will schedule her back in followup in about 3 months' time.     Ranelle Oyster, M.D. Electronically Signed    ZTS/MEDQ  D:  08/21/2011 11:54:42  T:  08/21/2011 13:30:26  Job:  [id number]

## 2011-12-18 ENCOUNTER — Ambulatory Visit: Payer: Medicare Other | Admitting: Physical Medicine & Rehabilitation

## 2011-12-18 ENCOUNTER — Telehealth: Payer: Self-pay | Admitting: Physical Medicine & Rehabilitation

## 2011-12-18 ENCOUNTER — Other Ambulatory Visit: Payer: Self-pay | Admitting: Internal Medicine

## 2011-12-18 ENCOUNTER — Other Ambulatory Visit: Payer: Self-pay | Admitting: *Deleted

## 2011-12-18 MED ORDER — RIZATRIPTAN BENZOATE 5 MG PO TABS: 5.0000 mg | ORAL_TABLET | Freq: Every day | ORAL | Status: DC | PRN

## 2011-12-18 NOTE — Telephone Encounter (Signed)
FYI,  I refilled the maxalt for headache

## 2011-12-18 NOTE — Telephone Encounter (Signed)
Patient needs refill on hydromorphone.  Leaving town tomorrow afternoon for a week and will need her headache medicine.

## 2011-12-18 NOTE — Telephone Encounter (Signed)
[name] [name] needs a refill on dilaudid. Her appointment was supposed to be today but was moved due to "go live" to 01/08/12. Going out of town tomorrow afternoon. Last fill was 10/18/11

## 2011-12-19 MED ORDER — HYDROMORPHONE HCL 4 MG PO TABS: 4.0000 mg | ORAL_TABLET | ORAL | Status: DC | PRN

## 2011-12-19 NOTE — Telephone Encounter (Signed)
Left VM for Ms Flynt telling her that her rx is available to pick up and that her headache medicine had been ordered.

## 2011-12-19 NOTE — Telephone Encounter (Signed)
done

## 2011-12-27 ENCOUNTER — Telehealth: Payer: Self-pay

## 2011-12-27 NOTE — Telephone Encounter (Signed)
Left message for patient to call back  

## 2011-12-27 NOTE — Telephone Encounter (Signed)
Message copied by Annett Fabian on Thu Dec 27, 2011  4:52 PM ------      Message from: Stan Head E      Created: Thu Dec 27, 2011  4:25 PM      Regarding: clarify egg allergy       She is due for recall colonoscopy and would be best w/propofol - she has an egg allergy - need to clarify what the reaction is      May end up needing an office visit w/me anyway if that's what it takes

## 2011-12-28 NOTE — Telephone Encounter (Signed)
Patient called back and was a little upset that her records were not reviewed before we called her.  She saw Dr Leone Payor in 2010 about the pain she had when eating eggs.  She was sent to an allergist and I have printed those records out of [employer] and they are in your office.  I have tried to reassure her that this was a oversight due to a change in the medical record system and conversion to EPIC.  Dr Leone Payor please review notes and advise on recall recommendations.

## 2012-01-02 NOTE — Telephone Encounter (Signed)
Ok - tell her I am sorry I missed the egg allergy problems in the chart - it was an EMR filing issue.  I want to know if she remembers her colonoscopy from 2008 - she got moderate sedation with Benadryl  If that was ok for her will do it again that way and she can make a previsit appointment for hx colon (rectal polyp) and screening  Seems like she cannot take Propofol with egg allergy  If sedation in 2008 was not adequate will work something else out with anesthesia at hospital - I will need to schedule that  Please let me know and if easier to do REV go ahead

## 2012-01-03 ENCOUNTER — Encounter: Payer: Self-pay | Admitting: *Deleted

## 2012-01-03 NOTE — Telephone Encounter (Signed)
Left message for patient to call back  

## 2012-01-03 NOTE — Progress Notes (Signed)
Received letter from [facility] regarding quantity limit authorization for Maxalt. Prior Authorization request initiated.

## 2012-01-07 NOTE — Telephone Encounter (Signed)
Left message for patient to call back  

## 2012-01-07 NOTE — Telephone Encounter (Signed)
She doesn't remember issues with colon.  She does want to see you in the office before scheduling .  She is scheduled for REV 02/14/12

## 2012-01-08 ENCOUNTER — Encounter: Payer: Medicare Other | Admitting: Physical Medicine & Rehabilitation

## 2012-01-10 ENCOUNTER — Other Ambulatory Visit: Payer: Self-pay | Admitting: Physical Medicine & Rehabilitation

## 2012-01-29 ENCOUNTER — Telehealth: Payer: Self-pay | Admitting: Physical Medicine & Rehabilitation

## 2012-01-29 NOTE — Telephone Encounter (Signed)
Pt aware that she will need to come in a speak to Dr. Riley Kill about this.

## 2012-01-29 NOTE — Telephone Encounter (Signed)
Patient would like to get Botox at next OV.  Will Dr order?

## 2012-01-30 ENCOUNTER — Telehealth: Payer: Self-pay | Admitting: Physical Medicine & Rehabilitation

## 2012-01-30 ENCOUNTER — Other Ambulatory Visit: Payer: Self-pay | Admitting: Physical Medicine & Rehabilitation

## 2012-01-30 NOTE — Telephone Encounter (Signed)
This has been sent in  

## 2012-01-30 NOTE — Telephone Encounter (Signed)
Needing refill on [place](?), stated pharmacy has faxed  3 times.

## 2012-02-05 ENCOUNTER — Telehealth: Payer: Self-pay | Admitting: Physical Medicine & Rehabilitation

## 2012-02-05 NOTE — Telephone Encounter (Signed)
Hydromorphone 4mg  1 q4h prn #40. Last Rx 12/19/11.  See message below.

## 2012-02-05 NOTE — Telephone Encounter (Signed)
Almost out of Hydromorphone.  Has had to take 11/2 - 2 at a time.  Does Dr want to change the dosage?

## 2012-02-06 MED ORDER — HYDROMORPHONE HCL 4 MG PO TABS: 4.0000 mg | ORAL_TABLET | ORAL | Status: DC | PRN

## 2012-02-06 NOTE — Telephone Encounter (Signed)
Lillia Abed called patient to make aware script ready to pick up.

## 2012-02-06 NOTE — Telephone Encounter (Signed)
No, i don't want to go up on the dose, or we will end up going down a path from which she will find it hard to return . Needs to work on other methods for management of pain spikes.

## 2012-02-14 ENCOUNTER — Ambulatory Visit: Payer: Medicare Other | Admitting: Internal Medicine

## 2012-03-18 ENCOUNTER — Encounter: Payer: Medicare Other | Attending: Physical Medicine & Rehabilitation | Admitting: Physical Medicine & Rehabilitation

## 2012-03-18 ENCOUNTER — Encounter: Payer: Self-pay | Admitting: Physical Medicine & Rehabilitation

## 2012-03-18 VITALS — BP 117/55 | HR 93 | Ht 66.0 in | Wt 152.0 lb

## 2012-03-18 DIAGNOSIS — M47817 Spondylosis without myelopathy or radiculopathy, lumbosacral region: Secondary | ICD-10-CM

## 2012-03-18 DIAGNOSIS — G43909 Migraine, unspecified, not intractable, without status migrainosus: Secondary | ICD-10-CM | POA: Insufficient documentation

## 2012-03-18 DIAGNOSIS — F3289 Other specified depressive episodes: Secondary | ICD-10-CM | POA: Insufficient documentation

## 2012-03-18 DIAGNOSIS — IMO0001 Reserved for inherently not codable concepts without codable children: Secondary | ICD-10-CM

## 2012-03-18 DIAGNOSIS — M542 Cervicalgia: Secondary | ICD-10-CM | POA: Insufficient documentation

## 2012-03-18 DIAGNOSIS — M545 Low back pain, unspecified: Secondary | ICD-10-CM | POA: Insufficient documentation

## 2012-03-18 DIAGNOSIS — G43719 Chronic migraine without aura, intractable, without status migrainosus: Secondary | ICD-10-CM | POA: Insufficient documentation

## 2012-03-18 DIAGNOSIS — F329 Major depressive disorder, single episode, unspecified: Secondary | ICD-10-CM | POA: Insufficient documentation

## 2012-03-18 DIAGNOSIS — M47816 Spondylosis without myelopathy or radiculopathy, lumbar region: Secondary | ICD-10-CM | POA: Insufficient documentation

## 2012-03-18 MED ORDER — HYDROMORPHONE HCL 4 MG PO TABS: 4.0000 mg | ORAL_TABLET | ORAL | Status: DC | PRN

## 2012-03-18 MED ORDER — SUMATRIPTAN SUCCINATE 6 MG/0.5ML ~~LOC~~ SOLN: 6.0000 mg | SUBCUTANEOUS | Status: DC | PRN

## 2012-03-18 NOTE — Patient Instructions (Signed)
Call me with questions 

## 2012-03-18 NOTE — Progress Notes (Signed)
Subjective:    Patient ID: [name] [name], female    DOB: 28-Jan-1976, 36 y.o.   MRN: [id number]  HPI  [name] [name] is back regarding her headaches and fibromyalgia. Her botox injections continue to help although the effects are wearing off.  She had sinus surgery, turbinate surgery on 02/22/12. She hasn't noticed a big difference yet, because of post-op healing, but she has been able to breath better.  Her low back has been bothering her. She hasn't been able to be quite as active because of her kids. She does like to exercise for stress relief though. She uses her cervical traction at home.  She tried the frova for migraines and it did help, but insurance wouldn't cover it.  maxalt has not been effective.   Pain Inventory Average Pain 7 Pain Right Now 6 My pain is dull, stabbing and aching  In the last 24 hours, has pain interfered with the following? General activity 5 Relation with others 5 Enjoyment of life 5 What TIME of day is your pain at its worst? morning and afternoon Sleep (in general) Poor  Pain is worse with: bending, sitting, inactivity, standing and some activites Pain improves with: rest, heat/ice, therapy/exercise, pacing activities, medication and injections Relief from Meds: 6  Mobility walk without assistance how many minutes can you walk? 20-30 min ability to climb steps?  yes do you drive?  yes Do you have any goals in this area?  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  no  Neuro/Psych spasms anxiety  Prior Studies Any changes since last visit?  no  Physicians involved in your care Any changes since last visit?  no   Family History  Problem Relation Age of Onset  . Heart disease Mother   . Diabetes Mother   . Heart disease Father   . Heart disease    . Prostate cancer    . Diabetes     History   Social History  . Marital  Status: Single    Spouse Name: N/A    Number of Children: N/A  . Years of Education: N/A   Social History Main Topics  . Smoking status: Never Smoker   . Smokeless tobacco: Never Used  . Alcohol Use: Yes     occ.  . Drug Use: None  . Sexually Active: None   Other Topics Concern  . None   Social History Narrative  . None   Past Surgical History  Procedure Date  . Pituitary surgery 1999    resection of benign pituitary tumor, [facility]  . Colonoscopy 01/03/2007    Dr. Stan Head  . Esophagogastroduodenoscopy 09/29/2004    Dr. Karolee Ohs   Past Medical History  Diagnosis Date  . Depression   . Anxiety 1999/2000  . Panic disorder 1999/2000  . Seasonal allergies   . Migraines   . GERD (gastroesophageal reflux disease)   . IBS (irritable bowel syndrome)   . Adenomatous colon polyp 2008  . Constipation   . Sciatica   . Hematochezia   . Internal hemorrhoids   . 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     Review of Systems  All other systems reviewed and are negative.       Objective:   Physical Exam  Constitutional: She is oriented to person,  place, and time. She appears well-developed and well-nourished.  HENT:  Head: Normocephalic and atraumatic.  Eyes: Pupils are equal, round, and reactive to light.  Cardiovascular: Normal rate and regular rhythm.   Pulmonary/Chest: Effort normal and breath sounds normal. No respiratory distress. She has no wheezes.  Abdominal: Soft.  Musculoskeletal:       Right elevation of hemipelvis with counter clock-wise rotation of the pelvis, lumbar spine. Mild levoscoliosis of the back with apex at T12.  Neurological: She is alert and oriented to person, place, and time. No cranial nerve deficit. Coordination normal.  Psychiatric: She has a normal mood and affect. Her behavior is normal. Judgment and thought content normal.          Assessment & Plan:  ASSESSMENT:    1. Migraine headaches.  2. Fibromyalgia syndrome.  3. Depression.  4. Chronic low back pain and cervicalgia. The patient with myofascial  and spondylosis components.  PLAN:  1. I will send her for outpt PT to address pilates exercises to reestablish rom, posture, length, HEP We will look into getting her trial unit to use at home.  2. We will set the patient up for Botox injections again 100 units to the  temporalis frontalis and procerus regions. We will utilize 100  units total.  3. We will try [place] imitrex for breakthrough migraines 2.5 mg daily p.r.n.  4. Dilaudid for severe breakthrough pain 5. Continue with diet as she has been doing as well as  supplementations.  6. I will see her back pending injections above

## 2012-03-19 ENCOUNTER — Telehealth: Payer: Self-pay | Admitting: Physical Medicine & Rehabilitation

## 2012-03-19 NOTE — Telephone Encounter (Signed)
Dr Riley Kill,   I need dx code, EMG code.  Thanks.

## 2012-03-20 NOTE — Telephone Encounter (Signed)
Please disregard

## 2012-03-25 ENCOUNTER — Ambulatory Visit: Payer: Medicare Other | Admitting: Rehabilitative and Restorative Service Providers"

## 2012-04-02 ENCOUNTER — Ambulatory Visit: Payer: Medicare Other | Attending: Physical Medicine & Rehabilitation

## 2012-04-02 DIAGNOSIS — IMO0001 Reserved for inherently not codable concepts without codable children: Secondary | ICD-10-CM | POA: Insufficient documentation

## 2012-04-02 DIAGNOSIS — M546 Pain in thoracic spine: Secondary | ICD-10-CM | POA: Insufficient documentation

## 2012-04-02 DIAGNOSIS — M545 Low back pain, unspecified: Secondary | ICD-10-CM | POA: Insufficient documentation

## 2012-04-08 ENCOUNTER — Ambulatory Visit: Payer: Medicare Other | Admitting: Rehabilitation

## 2012-04-09 ENCOUNTER — Encounter: Payer: Medicare Other | Admitting: Physical Therapy

## 2012-04-10 ENCOUNTER — Ambulatory Visit: Payer: Medicare Other | Admitting: Physical Therapy

## 2012-04-15 ENCOUNTER — Ambulatory Visit: Payer: Medicare Other | Admitting: Physical Therapy

## 2012-04-17 ENCOUNTER — Encounter: Payer: Medicare Other | Admitting: Physical Therapy

## 2012-04-21 ENCOUNTER — Ambulatory Visit: Payer: Medicare Other | Admitting: Physical Medicine & Rehabilitation

## 2012-04-22 ENCOUNTER — Encounter: Payer: Medicare Other | Admitting: Physical Therapy

## 2012-04-22 ENCOUNTER — Ambulatory Visit: Payer: Medicare Other | Admitting: Physical Therapy

## 2012-04-23 ENCOUNTER — Encounter: Payer: Self-pay | Admitting: Internal Medicine

## 2012-04-23 ENCOUNTER — Encounter: Payer: Medicare Other | Admitting: Physical Therapy

## 2012-04-29 ENCOUNTER — Encounter: Payer: Self-pay | Admitting: Physical Medicine & Rehabilitation

## 2012-04-29 ENCOUNTER — Encounter: Payer: Medicare Other | Attending: Physical Medicine & Rehabilitation | Admitting: Physical Medicine & Rehabilitation

## 2012-04-29 ENCOUNTER — Ambulatory Visit: Payer: Medicare Other | Attending: Physical Medicine & Rehabilitation | Admitting: Physical Therapy

## 2012-04-29 VITALS — BP 122/63 | HR 89 | Resp 16 | Wt 156.0 lb

## 2012-04-29 DIAGNOSIS — M546 Pain in thoracic spine: Secondary | ICD-10-CM | POA: Insufficient documentation

## 2012-04-29 DIAGNOSIS — M545 Low back pain, unspecified: Secondary | ICD-10-CM | POA: Insufficient documentation

## 2012-04-29 DIAGNOSIS — G43719 Chronic migraine without aura, intractable, without status migrainosus: Secondary | ICD-10-CM | POA: Insufficient documentation

## 2012-04-29 DIAGNOSIS — M47816 Spondylosis without myelopathy or radiculopathy, lumbar region: Secondary | ICD-10-CM

## 2012-04-29 DIAGNOSIS — M47817 Spondylosis without myelopathy or radiculopathy, lumbosacral region: Secondary | ICD-10-CM | POA: Insufficient documentation

## 2012-04-29 DIAGNOSIS — IMO0001 Reserved for inherently not codable concepts without codable children: Secondary | ICD-10-CM | POA: Insufficient documentation

## 2012-04-29 DIAGNOSIS — G43019 Migraine without aura, intractable, without status migrainosus: Secondary | ICD-10-CM

## 2012-04-29 MED ORDER — HYDROMORPHONE HCL 4 MG PO TABS: 4.0000 mg | ORAL_TABLET | ORAL | Status: DC | PRN

## 2012-04-29 MED ORDER — HYDROCODONE-ACETAMINOPHEN 5-325 MG PO TABS: 1.0000 | ORAL_TABLET | Freq: Two times a day (BID) | ORAL | Status: DC

## 2012-04-29 NOTE — Progress Notes (Signed)
  Subjective:    Patient ID: [name] [name], female    DOB: 1976-06-04, 36 y.o.   MRN: [id number]  HPIMarci is back for botox injections today.    Review of Systems     Objective:   Physical Exam        Assessment & Plan:  Botox Injection for Migraine  Dilution: 50 Units/ml--total of 100 units was used. Indication: Severe spasticity which interferes with ADL,mobility and/or  hygiene and is unresponsive to medication management and other conservative care Informed consent was obtained after describing risks and benefits of the procedure with the patient. This includes bleeding, bruising, infection, excessive weakness, or medication side effects. A REMS form is on file and signed.  Number of units per muscle Right temporalis 15 units Frontalis at 6 acces points 55 units Procerus 2 access points 15 Left temporalis 15 All injections were done after obtaining appropriate after negative drawback for blood. The patient tolerated the procedure well. Post procedure instructions were given. A followup appointment was made. All areas were dressed and were without drainage at the time she left the office.

## 2012-04-29 NOTE — Progress Notes (Deleted)
  Subjective:   Pain Inventory Average Pain 6 Pain Right Now 7 My pain is burning and aching  In the last 24 hours, has pain interfered with the following? General activity 6 Relation with others 6 Enjoyment of life 6 What TIME of day is your pain at its worst? morning Sleep (in general) Poor  Pain is worse with: sitting, inactivity, some activites and back pain has flared up w/ starting Pilates Pain improves with: rest, heat/ice, therapy/exercise and injections Relief from Meds: 6  Mobility walk without assistance how many minutes can you walk? 15 ability to climb steps?  yes do you drive?  yes Do you have any goals in this area?  no  Function Do you have any goals in this area?  no  Neuro/Psych No problems in this area  Prior Studies Any changes since last visit?  no  Physicians involved in your care Any changes since last visit?  no   Family History  Problem Relation Age of Onset  . Heart disease Mother   . Diabetes Mother   . Heart disease Father   . Heart disease    . Prostate cancer    . Diabetes     History   Social History  . Marital Status: Single    Spouse Name: N/A    Number of Children: N/A  . Years of Education: N/A   Social History Main Topics  . Smoking status: Never Smoker   . Smokeless tobacco: Never Used  . Alcohol Use: Yes     occ.  . Drug Use: None  . Sexually Active: None   Other Topics Concern  . None   Social History Narrative  . None   Past Surgical History  Procedure Date  . Pituitary surgery 1999    resection of benign pituitary tumor, [facility]  . Colonoscopy 01/03/2007    Dr. Stan Head  . Esophagogastroduodenoscopy 09/29/2004    Dr. Karolee Ohs   Past Medical History  Diagnosis Date  . Depression   . Anxiety 1999/2000  . Panic disorder 1999/2000  . Seasonal allergies   . Migraines   . GERD (gastroesophageal reflux disease)   . IBS (irritable bowel syndrome)   . Adenomatous colon polyp 2008    . Constipation   . Sciatica   . Hematochezia   . Internal hemorrhoids   . 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   MRN: [id number]  HPI    Review of Systems  Constitutional: Negative.   Eyes: Negative.   Respiratory: Negative.   Cardiovascular: Negative.   Gastrointestinal: Negative.   Genitourinary: Negative.   Musculoskeletal: Positive for back pain.  Skin: Negative.   Neurological: Positive for numbness (rt leg when sciatica flares up) and headaches.  Hematological: Negative.   Psychiatric/Behavioral: Negative.        Objective:   Physical Exam        Assessment & Plan:

## 2012-05-01 ENCOUNTER — Ambulatory Visit: Payer: Medicare Other | Admitting: Physical Therapy

## 2012-05-06 ENCOUNTER — Ambulatory Visit: Payer: Medicare Other | Admitting: Rehabilitation

## 2012-05-13 ENCOUNTER — Encounter: Payer: Medicare Other | Admitting: Rehabilitation

## 2012-05-15 ENCOUNTER — Ambulatory Visit: Payer: Medicare Other | Admitting: Rehabilitation

## 2012-05-19 ENCOUNTER — Telehealth: Payer: Self-pay | Admitting: Internal Medicine

## 2012-05-19 NOTE — Telephone Encounter (Signed)
Patient wants to sch direct recall.  She does not want to come in for an office visit.  See phone notes from 12/27/11.  She states she doesn't have an allergy to eggs, just that they cause stomach pain with eating them.  Dr. Leone Payor please advise.

## 2012-05-20 ENCOUNTER — Encounter: Payer: Medicare Other | Admitting: Rehabilitation

## 2012-05-20 ENCOUNTER — Telehealth: Payer: Self-pay

## 2012-05-20 ENCOUNTER — Ambulatory Visit (AMBULATORY_SURGERY_CENTER): Payer: Medicare Other

## 2012-05-20 ENCOUNTER — Encounter: Payer: Self-pay | Admitting: Internal Medicine

## 2012-05-20 VITALS — Ht 66.0 in | Wt 157.3 lb

## 2012-05-20 DIAGNOSIS — Z1211 Encounter for screening for malignant neoplasm of colon: Secondary | ICD-10-CM

## 2012-05-20 DIAGNOSIS — Z86010 Personal history of colonic polyps: Secondary | ICD-10-CM

## 2012-05-20 DIAGNOSIS — Z800 Family history of malignant neoplasm of digestive organs: Secondary | ICD-10-CM

## 2012-05-20 MED ORDER — MOVIPREP 100 G PO SOLR: ORAL | Status: DC

## 2012-05-20 NOTE — Telephone Encounter (Signed)
Ok to do direct  I think she has egg-related GI symptoms but not true allergy and should be ok for propofol - have discussed with CRNA

## 2012-05-20 NOTE — Telephone Encounter (Signed)
Ok to switch from me if desired  At this point I would say she has requested to change from me so  I will not be her gastroenterologist anymore.

## 2012-05-20 NOTE — Telephone Encounter (Signed)
Dr Leone Payor, This patient came into the office today for her pre-visit prior to her colonoscopy on 05/30/12.The patient is requesting to switch doctors and go back to  Dr Russella Dar (whom she saw in 2005)  because she feels more comfortable with him. Please advise?

## 2012-05-20 NOTE — Telephone Encounter (Signed)
Patient is scheduled for pre-visit for today at 4:30 and colon 05/30/12

## 2012-05-21 NOTE — Telephone Encounter (Addendum)
After further review she had seen me previously and switched from me. I am not comfortable resuming as her doctor.

## 2012-05-21 NOTE — Telephone Encounter (Signed)
Dr. Russella Dar this pt requested you as her GI doctor.  Will you accept this pt?

## 2012-05-21 NOTE — Telephone Encounter (Signed)
Dr. Leone Payor;  Does this pt need to be discharged from the practice?  Dr. Russella Dar does not accept pt.

## 2012-05-21 NOTE — Telephone Encounter (Signed)
It sounds like that may be best  She has a colonoscopy appointment which will need to be cancelled also  Unless someone else is willing to accept her which seems unlikely we should discharge her  We need to call her and inform her of this and then I can do paperwork

## 2012-05-22 ENCOUNTER — Encounter: Payer: Medicare Other | Admitting: Rehabilitation

## 2012-05-22 NOTE — Telephone Encounter (Signed)
Left message for patient to call back  

## 2012-05-23 NOTE — Telephone Encounter (Signed)
Left message for patient to call back  

## 2012-05-26 ENCOUNTER — Telehealth: Payer: Self-pay | Admitting: Gastroenterology

## 2012-05-26 NOTE — Telephone Encounter (Signed)
Left message for pt to call back.  Pt called back. Discussed multiple messages with pt that were in her chart regarding colon and switching. Pt is upset because she states she originally was Dr. Ardell Isaacs pt and was sick, needed a sooner appt and was seen by Dr. Leone Payor. Pt states she just wanted to switch back to Dr. Russella Dar because she was originally his pt. Pt quite upset and asked to speak with the Director. Tried to transfer her to [employer] but no answer. Pt given her name and contact information. Let pt know I would send this note back to Darcey Nora RN for her to call pt tomorrow. Pt verbalized understanding.

## 2012-05-27 ENCOUNTER — Encounter: Payer: Medicare Other | Admitting: Rehabilitation

## 2012-05-27 ENCOUNTER — Encounter: Payer: Self-pay | Admitting: Internal Medicine

## 2012-05-27 NOTE — Telephone Encounter (Signed)
Patient advised of the decisions of Dr. Stark and Dr. Gessner.  She wants to speak with the office manager.  I have advised her that I will forward this to Annie Bason.  She wants a copy of her records.  She is advised that she will need to go to Medical Records to pick them up and sign a release.  At that point she hung up on me.  Dr. Gessner will you please do letter for discharge,  I will put the paperwork in your office 

## 2012-05-27 NOTE — Telephone Encounter (Signed)
Patient advised of the decisions of Dr. Russella Dar and Dr. Leone Payor.  She wants to speak with the office manager.  I have advised her that I will forward this to [facility].  She wants a copy of her records.  She is advised that she will need to go to Medical Records to pick them up and sign a release.  At that point she hung up on me.  Dr. Leone Payor will you please do letter for discharge,  I will put the paperwork in your office

## 2012-05-27 NOTE — Telephone Encounter (Signed)
Left message for patient to call back  

## 2012-05-28 ENCOUNTER — Telehealth: Payer: Self-pay | Admitting: Internal Medicine

## 2012-05-28 NOTE — Telephone Encounter (Signed)
Patient called and left message on my voice mail wanting to talk about her being discharged.  I called her back and left a message for her to call me.

## 2012-05-28 NOTE — Telephone Encounter (Signed)
Dismissal Letter sent by Certified Mail 05/28/2012  Received the Return Receipt showing the patient has the Dismissal Letter 06/02/2012

## 2012-05-29 ENCOUNTER — Ambulatory Visit: Payer: Medicare Other | Attending: Physical Medicine & Rehabilitation | Admitting: Rehabilitation

## 2012-05-29 DIAGNOSIS — M545 Low back pain, unspecified: Secondary | ICD-10-CM | POA: Insufficient documentation

## 2012-05-29 DIAGNOSIS — IMO0001 Reserved for inherently not codable concepts without codable children: Secondary | ICD-10-CM | POA: Insufficient documentation

## 2012-05-29 DIAGNOSIS — M546 Pain in thoracic spine: Secondary | ICD-10-CM | POA: Insufficient documentation

## 2012-05-30 ENCOUNTER — Encounter: Payer: Medicare Other | Admitting: Internal Medicine

## 2012-06-03 ENCOUNTER — Ambulatory Visit: Payer: Medicare Other | Admitting: Rehabilitation

## 2012-06-05 ENCOUNTER — Encounter: Payer: Medicare Other | Admitting: Rehabilitation

## 2012-06-10 ENCOUNTER — Encounter: Payer: Medicare Other | Admitting: Rehabilitation

## 2012-06-12 ENCOUNTER — Encounter: Payer: Medicare Other | Admitting: Rehabilitation

## 2012-06-17 ENCOUNTER — Encounter: Payer: Medicare Other | Admitting: Rehabilitation

## 2012-06-19 ENCOUNTER — Encounter: Payer: Medicare Other | Admitting: Rehabilitation

## 2012-06-19 ENCOUNTER — Encounter: Payer: Medicare Other | Admitting: Gastroenterology

## 2012-07-03 ENCOUNTER — Ambulatory Visit: Payer: Medicare Other | Admitting: Rehabilitation

## 2012-07-04 ENCOUNTER — Telehealth: Payer: Self-pay | Admitting: Physical Medicine & Rehabilitation

## 2012-07-04 ENCOUNTER — Telehealth: Payer: Self-pay | Admitting: Internal Medicine

## 2012-07-04 DIAGNOSIS — G43719 Chronic migraine without aura, intractable, without status migrainosus: Secondary | ICD-10-CM

## 2012-07-04 DIAGNOSIS — IMO0001 Reserved for inherently not codable concepts without codable children: Secondary | ICD-10-CM

## 2012-07-04 DIAGNOSIS — M47816 Spondylosis without myelopathy or radiculopathy, lumbar region: Secondary | ICD-10-CM

## 2012-07-04 MED ORDER — HYDROMORPHONE HCL 4 MG PO TABS: 4.0000 mg | ORAL_TABLET | ORAL | Status: DC | PRN

## 2012-07-04 NOTE — Telephone Encounter (Signed)
Refill on Hydromorphone

## 2012-07-04 NOTE — Telephone Encounter (Signed)
Rx is ready for pt to pick up, pt aware.

## 2012-07-08 ENCOUNTER — Ambulatory Visit: Payer: Medicare Other | Attending: Physical Medicine & Rehabilitation | Admitting: Rehabilitation

## 2012-07-08 DIAGNOSIS — IMO0001 Reserved for inherently not codable concepts without codable children: Secondary | ICD-10-CM | POA: Insufficient documentation

## 2012-07-08 DIAGNOSIS — M546 Pain in thoracic spine: Secondary | ICD-10-CM | POA: Insufficient documentation

## 2012-07-08 DIAGNOSIS — M545 Low back pain, unspecified: Secondary | ICD-10-CM | POA: Insufficient documentation

## 2012-08-05 ENCOUNTER — Encounter: Payer: Medicare Other | Admitting: Rehabilitation

## 2012-08-07 ENCOUNTER — Encounter: Payer: Medicare Other | Admitting: Rehabilitation

## 2012-08-07 NOTE — Telephone Encounter (Signed)
Pt was dismissed from [facility]

## 2012-08-11 ENCOUNTER — Telehealth: Payer: Self-pay

## 2012-08-11 DIAGNOSIS — IMO0001 Reserved for inherently not codable concepts without codable children: Secondary | ICD-10-CM

## 2012-08-11 DIAGNOSIS — M47816 Spondylosis without myelopathy or radiculopathy, lumbar region: Secondary | ICD-10-CM

## 2012-08-11 DIAGNOSIS — G43719 Chronic migraine without aura, intractable, without status migrainosus: Secondary | ICD-10-CM

## 2012-08-11 MED ORDER — HYDROMORPHONE HCL 4 MG PO TABS: 4.0000 mg | ORAL_TABLET | ORAL | Status: DC | PRN

## 2012-08-11 NOTE — Telephone Encounter (Signed)
Lm advising patient script is ready for pick up.

## 2012-08-11 NOTE — Telephone Encounter (Signed)
Patient needs hydromorphone refilled.

## 2012-08-12 ENCOUNTER — Encounter: Payer: Medicare Other | Admitting: Rehabilitation

## 2012-08-14 ENCOUNTER — Encounter: Payer: Medicare Other | Admitting: Rehabilitation

## 2012-08-19 ENCOUNTER — Encounter: Payer: Medicare Other | Admitting: Rehabilitation

## 2012-08-21 ENCOUNTER — Encounter: Payer: Medicare Other | Admitting: Rehabilitation

## 2012-08-26 ENCOUNTER — Encounter: Payer: Medicare Other | Attending: Physical Medicine & Rehabilitation | Admitting: Physical Medicine & Rehabilitation

## 2012-08-26 ENCOUNTER — Encounter: Payer: Self-pay | Admitting: Physical Medicine & Rehabilitation

## 2012-08-26 VITALS — BP 124/57 | HR 91 | Resp 18 | Ht 66.0 in | Wt 158.0 lb

## 2012-08-26 DIAGNOSIS — Z5181 Encounter for therapeutic drug level monitoring: Secondary | ICD-10-CM

## 2012-08-26 DIAGNOSIS — G43719 Chronic migraine without aura, intractable, without status migrainosus: Secondary | ICD-10-CM

## 2012-08-26 DIAGNOSIS — M47816 Spondylosis without myelopathy or radiculopathy, lumbar region: Secondary | ICD-10-CM

## 2012-08-26 DIAGNOSIS — G43909 Migraine, unspecified, not intractable, without status migrainosus: Secondary | ICD-10-CM | POA: Insufficient documentation

## 2012-08-26 DIAGNOSIS — IMO0001 Reserved for inherently not codable concepts without codable children: Secondary | ICD-10-CM

## 2012-08-26 MED ORDER — HYDROMORPHONE HCL 4 MG PO TABS: 4.0000 mg | ORAL_TABLET | ORAL | Status: DC | PRN

## 2012-08-26 MED ORDER — AMPHETAMINE-DEXTROAMPHET ER 10 MG PO CP24: 20.0000 mg | ORAL_CAPSULE | ORAL | Status: DC

## 2012-08-26 NOTE — Patient Instructions (Signed)
CALL ME WITH QUESTIONS 

## 2012-08-26 NOTE — Addendum Note (Signed)
Addended by: Judd Gaudier on: 08/26/2012 01:20 PM   Modules accepted: Orders

## 2012-08-26 NOTE — Progress Notes (Signed)
Subjective:   Pain Inventory Average Pain 6 Pain Right Now 5 My pain is constant, dull, stabbing and aching  In the last 24 hours, has pain interfered with the following? General activity 3 Relation with others 3 Enjoyment of life 3 What TIME of day is your pain at its worst? in the morning and during the day Sleep (in general) Fair  Pain is worse with: some activites Pain improves with: medication Relief from Meds: 5  Mobility Do you have any goals in this area?  no  Function Do you have any goals in this area?  no  Neuro/Psych No problems in this area  Prior Studies Any changes since last visit?  no  Physicians involved in your care Any changes since last visit?  no   Family History  Problem Relation Age of Onset  . Heart disease Mother   . Diabetes Mother   . Heart disease Father   . Prostate cancer Father   . Heart disease    . Prostate cancer    . Diabetes    . Colon cancer Maternal Grandmother    History   Social History  . Marital Status: Single    Spouse Name: N/A    Number of Children: N/A  . Years of Education: N/A   Social History Main Topics  . Smoking status: Never Smoker   . Smokeless tobacco: Never Used  . Alcohol Use: No     Comment: occ.  . Drug Use: No  . Sexually Active: None   Other Topics Concern  . None   Social History Narrative  . None   Past Surgical History  Procedure Date  . Pituitary surgery 1999    resection of benign pituitary tumor, [facility]  . Colonoscopy 01/03/2007    Dr. Stan Head  . Esophagogastroduodenoscopy 09/29/2004    Dr. Karolee Ohs  . Uterine fibroid surgery    Past Medical History  Diagnosis Date  . Depression   . Anxiety 1999/2000  . Panic disorder 1999/2000  . Seasonal allergies   . Migraines   . GERD (gastroesophageal reflux disease)   . IBS (irritable bowel syndrome)   . Adenomatous colon polyp 2008  . Constipation   . Sciatica   . Hematochezia   . Internal  hemorrhoids   . 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   MRN: [id number]  HPI    Review of Systems  HENT: Positive for neck pain.   Eyes: Positive for photophobia.  Respiratory: Negative.   Cardiovascular: Negative.   Gastrointestinal: Negative.   Genitourinary: Negative.   Musculoskeletal: Positive for myalgias.  Skin: Negative.   Neurological: Positive for headaches.  Hematological: Negative.   Psychiatric/Behavioral: Negative.        Objective:   Physical Exam        Assessment & Plan:  Botox Injection for Migraine  Dilution: 50 Units/ml--total of 100 units was used.  Indication: Severe spasticity which interferes with ADL,mobility and/or hygiene and is unresponsive to medication management and other conservative care  Informed consent was obtained after describing risks and benefits of the procedure with the patient. This includes bleeding, bruising, infection, excessive weakness, or medication side effects. A REMS form  is on file and signed.  Number of units per muscle  Right temporalis 10 units  Frontalis at 6 acces points 60 units  Procerus 2 access points 20  Left temporalis 10  All injections were done after obtaining appropriate after negative drawback for blood. The patient tolerated the procedure well. Post procedure instructions were given. A followup appointment was made for 4 months.  All areas were dressed and were without drainage at the time she left the office.

## 2012-10-09 ENCOUNTER — Telehealth: Payer: Self-pay | Admitting: *Deleted

## 2012-10-09 DIAGNOSIS — IMO0001 Reserved for inherently not codable concepts without codable children: Secondary | ICD-10-CM

## 2012-10-09 DIAGNOSIS — G43719 Chronic migraine without aura, intractable, without status migrainosus: Secondary | ICD-10-CM

## 2012-10-09 DIAGNOSIS — M47816 Spondylosis without myelopathy or radiculopathy, lumbar region: Secondary | ICD-10-CM

## 2012-10-09 MED ORDER — AMPHETAMINE-DEXTROAMPHET ER 10 MG PO CP24: 20.0000 mg | ORAL_CAPSULE | ORAL | Status: DC

## 2012-10-09 MED ORDER — HYDROMORPHONE HCL 4 MG PO TABS: 4.0000 mg | ORAL_TABLET | ORAL | Status: DC | PRN

## 2012-10-09 NOTE — Telephone Encounter (Signed)
Calling for refills on Hydromorphone and Adderall.

## 2012-10-09 NOTE — Telephone Encounter (Signed)
Prescriptions printed out for Dr. Riley Kill to sign

## 2012-10-10 NOTE — Telephone Encounter (Signed)
Left message advising patient scripts are ready for pick up.

## 2012-10-18 ENCOUNTER — Ambulatory Visit: Payer: Medicare Other

## 2012-10-18 ENCOUNTER — Emergency Department (HOSPITAL_COMMUNITY): Payer: Medicare Other

## 2012-10-18 ENCOUNTER — Encounter (HOSPITAL_COMMUNITY): Payer: Self-pay | Admitting: Emergency Medicine

## 2012-10-18 ENCOUNTER — Emergency Department (HOSPITAL_COMMUNITY)
Admission: EM | Admit: 2012-10-18 | Discharge: 2012-10-18 | Disposition: A | Discharge status: Home / Self Care | Payer: Medicare Other | Attending: Emergency Medicine | Admitting: Emergency Medicine

## 2012-10-18 DIAGNOSIS — K219 Gastro-esophageal reflux disease without esophagitis: Secondary | ICD-10-CM | POA: Insufficient documentation

## 2012-10-18 DIAGNOSIS — Y929 Unspecified place or not applicable: Secondary | ICD-10-CM | POA: Insufficient documentation

## 2012-10-18 DIAGNOSIS — R296 Repeated falls: Secondary | ICD-10-CM | POA: Insufficient documentation

## 2012-10-18 DIAGNOSIS — Z860100 Personal history of colon polyps, unspecified: Secondary | ICD-10-CM | POA: Insufficient documentation

## 2012-10-18 DIAGNOSIS — G43909 Migraine, unspecified, not intractable, without status migrainosus: Secondary | ICD-10-CM | POA: Insufficient documentation

## 2012-10-18 DIAGNOSIS — Z8659 Personal history of other mental and behavioral disorders: Secondary | ICD-10-CM | POA: Insufficient documentation

## 2012-10-18 DIAGNOSIS — R0982 Postnasal drip: Secondary | ICD-10-CM | POA: Insufficient documentation

## 2012-10-18 DIAGNOSIS — IMO0002 Reserved for concepts with insufficient information to code with codable children: Secondary | ICD-10-CM | POA: Insufficient documentation

## 2012-10-18 DIAGNOSIS — R059 Cough, unspecified: Secondary | ICD-10-CM | POA: Insufficient documentation

## 2012-10-18 DIAGNOSIS — R05 Cough: Secondary | ICD-10-CM | POA: Insufficient documentation

## 2012-10-18 DIAGNOSIS — S62509A Fracture of unspecified phalanx of unspecified thumb, initial encounter for closed fracture: Secondary | ICD-10-CM

## 2012-10-18 DIAGNOSIS — Y9389 Activity, other specified: Secondary | ICD-10-CM | POA: Insufficient documentation

## 2012-10-18 DIAGNOSIS — Z8709 Personal history of other diseases of the respiratory system: Secondary | ICD-10-CM | POA: Insufficient documentation

## 2012-10-18 DIAGNOSIS — Z79899 Other long term (current) drug therapy: Secondary | ICD-10-CM | POA: Insufficient documentation

## 2012-10-18 DIAGNOSIS — Z8719 Personal history of other diseases of the digestive system: Secondary | ICD-10-CM | POA: Insufficient documentation

## 2012-10-18 MED ORDER — HYDROCOD POLST-CHLORPHEN POLST 10-8 MG/5ML PO LQCR: 5.0000 mL | Freq: Two times a day (BID) | ORAL | Status: DC

## 2012-10-18 MED ORDER — HYDROCODONE-ACETAMINOPHEN 5-325 MG PO TABS: 2.0000 | ORAL_TABLET | ORAL | Status: DC | PRN

## 2012-10-18 NOTE — ED Provider Notes (Signed)
History     CSN: [id number]  Arrival date & time 10/18/12  1402   First MD Initiated Contact with Patient 10/18/12 1623      Chief Complaint  Patient presents with  . Wrist Pain  . Cough    (Consider location/radiation/quality/duration/timing/severity/associated sxs/prior treatment) HPI Comments: [name] [name] is a 36 y.o. female who injured her right, when she fell going down a slide. Injury occurred today. She is pain with touch and movement of her right thumb. There is no pain with wrist motion. She did not injure her head, neck, or back. She has had cough with postnasal drip, no productive sputum, for 2 weeks. The cough is worse at night. She does not have a fever. She has intermittent recurrent symptoms of cough, and postnasal drip. There are no other modifying factors  Patient is a 36 y.o. female presenting with wrist pain and cough. The history is provided by the patient.  Wrist Pain  Cough    Past Medical History  Diagnosis Date  . Depression   . Anxiety 1999/2000  . Panic disorder 1999/2000  . Seasonal allergies   . Migraines   . GERD (gastroesophageal reflux disease)   . IBS (irritable bowel syndrome)   . Adenomatous colon polyp 2008  . Constipation   . Sciatica   . Hematochezia   . Internal hemorrhoids   . 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 Pituitary surgery 1999    resection of benign pituitary tumor, [facility]  . Colonoscopy 01/03/2007    Dr. Stan Head  . Esophagogastroduodenoscopy 09/29/2004    Dr. Karolee Ohs  . Uterine fibroid surgery     Family History  Problem Relation Age of Onset  . Heart disease Mother   . Diabetes Mother   . Heart disease Father   . Prostate cancer Father   . Heart disease    . Prostate cancer    . Diabetes    . Colon cancer Maternal Grandmother     History  Substance Use Topics  . Smoking status: Never Smoker   . Smokeless tobacco: Never Used  .  Alcohol Use: No     Comment: occ.    OB History    Grav Para Term Preterm Abortions TAB SAB Ect Mult Living                  Review of Systems  Respiratory: Positive for cough.   All other systems reviewed and are negative.    Allergies  Latex  Home Medications   Current Outpatient Rx  Name  Route  Sig  Dispense  Refill  . AMPHETAMINE-DEXTROAMPHET ER 10 MG PO CP24   Oral   Take 10 mg by mouth daily as needed. For ADD         . VITAMIN D 1000 UNITS PO TABS   Oral   Take 1,000 Units by mouth daily. Take 3000 units daily         . HYDROMORPHONE HCL 4 MG PO TABS   Oral   Take 4 mg by mouth every 4 (four) hours as needed. For migraines         . ADULT MULTIVITAMIN W/MINERALS CH   Oral   Take 1 tablet by mouth daily.         Marland Kitchen ONE-DAILY MULTI VITAMINS PO TABS   Oral   Take 1 tablet by mouth daily.         Marland Kitchen  OMEGA-3-ACID ETHYL ESTERS 1 G PO CAPS   Oral   Take 2 g by mouth daily.         Marland Kitchen PANTOPRAZOLE SODIUM 40 MG PO TBEC   Oral   Take 40 mg by mouth daily as needed. For acid reflux         . SUMATRIPTAN SUCCINATE 6 MG/0.5ML [place] SOLN   Subcutaneous   Inject 0.5 mLs (6 mg total) into the skin every 2 (two) hours as needed for migraine or headache. F   6 vial   2   . HYDROCOD POLST-CPM POLST ER 10-8 MG/5ML PO LQCR   Oral   Take 5 mLs by mouth every 12 (twelve) hours.   140 mL   0   . HYDROCODONE-ACETAMINOPHEN 5-325 MG PO TABS   Oral   Take 2 tablets by mouth every 4 (four) hours as needed for pain.   20 tablet   0     BP 113/61  Pulse 85  Temp 98 F (36.7 C) (Oral)  Resp 16  SpO2 97%  LMP 10/08/2012  Physical Exam  Nursing note and vitals reviewed. Constitutional: She is oriented to person, place, and time. She appears well-developed and well-nourished.  HENT:  Head: Normocephalic and atraumatic.  Eyes: Conjunctivae normal and EOM are normal. Pupils are equal, round, and reactive to light.  Neck: Normal range of motion and  phonation normal. Neck supple.  Cardiovascular: Normal rate, regular rhythm and intact distal pulses.   Pulmonary/Chest: Effort normal and breath sounds normal. No respiratory distress. She has no wheezes. She has no rales. She exhibits no tenderness.  Musculoskeletal:       Right hand, tender, and swollen on the first anterior metacarpal area. There is also localized tenderness of the radial aspect of the first MCP. There is mild MCP swelling. She is neurovascularly intact in the right thumb. There is no deformity.  Neurological: She is alert and oriented to person, place, and time. She has normal strength. She exhibits normal muscle tone.  Skin: Skin is warm and dry.  Psychiatric: She has a normal mood and affect. Her behavior is normal. Judgment and thought content normal.    ED Course  Procedures (including critical care time)  Labs Reviewed - No data to display Dg Wrist Complete Right  10/18/2012  *RADIOLOGY REPORT*  Clinical Data: Wrist pain  RIGHT WRIST - COMPLETE 3+ VIEW  Comparison: None.  Findings: No fracture of distal radius or ulna.  Radiocarpal joint is normal.  No carpal fracture.  IMPRESSION: No wrist fracture.   Original Report Authenticated By: Genevive Bi, M.D.    Dg Hand Complete Right  10/18/2012  *RADIOLOGY REPORT*  Clinical Data: Fall, wrist pain  RIGHT HAND - COMPLETE 3+ VIEW  Comparison: None.  Findings: The radiocarpal joint is intact.  No carpal fracture.  No evidence of metacarpal fracture.  The phalanges are normal.  No evidence of dislocation.  There is an irregularity at the base of the proximal phalanx of the first digit at the metacarpal joint seen on the lateral view.  IMPRESSION: Potential corner fracture at the base of the proximal phalanx of the first digit at the first carpal metacarpal joint.   Original Report Authenticated By: Genevive Bi, M.D.    Nursing notes, applicable records and vitals reviewed.  Radiologic Images/Reports reviewed.  1.  Thumb fracture   2. Cough       MDM  Fall, with isolated from injury. No acute intervention required. She  is treated with a splint and instructed to leave it on until seeing an orthopedist in one week. Her cough is nonspecific and may be related to postnasal drip. Her nasal symptoms have resolved. There is no clear indication for specific treatment for this cough. Patient requested a cough suppressant. Antibiotics are indicated that she has no signs for pneumonia no chronic lung disease, and is not a smoker.     Plan: Home Medications- Norco, Tussionex; Home Treatments- rest. Elevation of right hand; Recommended follow up- orthopedics 1 week     Flint Melter, MD 10/19/12 [phone]

## 2012-10-18 NOTE — ED Notes (Signed)
Ice applied & elevated right wrist. Patient would prefer to wait to see MD before changing into a grown.

## 2012-10-18 NOTE — ED Notes (Signed)
Pt states that she was playing on the playground with her kids, fell, broke her fall with her right hand and bent her thumb backwards.  States she heard a pop.  Also would like to get her cough checked that she has had for a week while she is here.

## 2012-10-18 NOTE — ED Notes (Signed)
Reports fall while playing with children trying to break a fall with arm & hand extended out now can not move thumb to touch fingers, +sensation, +radial pulse. Stated has ice on thumb for 2 hours. Pain scale is 8/10.  In addition has had a dry non-productive cough for over a  Week.

## 2012-10-20 ENCOUNTER — Other Ambulatory Visit: Payer: Medicare Other

## 2012-10-20 ENCOUNTER — Ambulatory Visit
Admission: RE | Admit: 2012-10-20 | Discharge: 2012-10-20 | Disposition: A | Discharge status: Home / Self Care | Payer: Medicare Other | Source: Ambulatory Visit | Attending: Orthopedic Surgery | Admitting: Orthopedic Surgery

## 2012-10-20 ENCOUNTER — Other Ambulatory Visit: Payer: Self-pay | Admitting: Orthopedic Surgery

## 2012-10-20 DIAGNOSIS — M79644 Pain in right finger(s): Secondary | ICD-10-CM

## 2012-10-20 DIAGNOSIS — T148XXA Other injury of unspecified body region, initial encounter: Secondary | ICD-10-CM

## 2012-10-22 NOTE — L&D Delivery Note (Signed)
Delivery Note  First Stage: Labor onset: 1300 regular ctx - prodromal ctx pattern x 2-3 days with progressive cervical change Augmentation : AROM Analgesia /Anesthesia intrapartum: none AROM at 1531  Second Stage: Complete dilation at 2035 Onset of pushing at 2035 FHR second stage 130-140 - no audible decels   Delivery of a viable female at 2056 by CNM in ROA position no nuchal cord Cord double clamped after cessation of pulsation, cut by FOB Cord blood sample collected   Baby to Dad for skin-skin Mom assisted out of tub to bed @ 2105   Third Stage: Placenta delivered [facility]  intact with 3 VC @ 2111 Placenta disposition: for patient to take home - placenta encapsulation planned Uterine tone firm / bleeding scant amount  no laceration identified  Anesthesia for repair: none Repair none Est. Blood Loss (mL): 200  Complications: none  Mom to postpartum.  Baby to Couplet care / Skin to Skin.  Newborn: Birth Weight:  Apgar Scores: 9-9 Feeding planned: breast  Marlinda Mike CNM, MSN, FACNM 10/08/2013, 9:22 PM

## 2012-11-13 ENCOUNTER — Other Ambulatory Visit: Payer: Self-pay | Admitting: Obstetrics and Gynecology

## 2012-12-03 ENCOUNTER — Telehealth: Payer: Self-pay

## 2012-12-03 NOTE — Telephone Encounter (Signed)
We can refill this time. If i find that she gets it from someone else again, we will no longer be able to fill narcs however

## 2012-12-03 NOTE — Telephone Encounter (Signed)
[name] [name] has called for a refill on her hydromophone.  She has a CSA with [place] but got hydrocodone from another MD in December just before getting the hydromophone from you. Do you want to refill?

## 2012-12-03 NOTE — Telephone Encounter (Signed)
Patient called to get hydromorphone refill.

## 2012-12-08 MED ORDER — HYDROMORPHONE HCL 4 MG PO TABS: 4.0000 mg | ORAL_TABLET | ORAL | Status: DC | PRN

## 2012-12-08 NOTE — Telephone Encounter (Signed)
Left message advising patient her script has been printed and ready for pick up.

## 2013-01-16 ENCOUNTER — Telehealth: Payer: Self-pay

## 2013-01-16 NOTE — Telephone Encounter (Signed)
Patient called to see if she could have botox at her next appiontment which is Tuesday.  Per Marisue Ivan this should be ok.  Patient aware.

## 2013-01-20 ENCOUNTER — Encounter: Payer: Medicare Other | Attending: Physical Medicine & Rehabilitation | Admitting: Physical Medicine & Rehabilitation

## 2013-01-20 ENCOUNTER — Encounter: Payer: Self-pay | Admitting: Physical Medicine & Rehabilitation

## 2013-01-20 VITALS — BP 104/65 | HR 85 | Resp 14 | Ht 66.0 in | Wt 156.0 lb

## 2013-01-20 DIAGNOSIS — G43709 Chronic migraine without aura, not intractable, without status migrainosus: Secondary | ICD-10-CM | POA: Insufficient documentation

## 2013-01-20 DIAGNOSIS — G43119 Migraine with aura, intractable, without status migrainosus: Secondary | ICD-10-CM

## 2013-01-20 DIAGNOSIS — G43909 Migraine, unspecified, not intractable, without status migrainosus: Secondary | ICD-10-CM | POA: Insufficient documentation

## 2013-01-20 MED ORDER — HYDROMORPHONE HCL 8 MG PO TABS: 4.0000 mg | ORAL_TABLET | Freq: Every day | ORAL | Status: DC | PRN

## 2013-01-20 NOTE — Progress Notes (Signed)
Botox Injection for chronic migraine headaches  Dilution: 100 Units/ 2ml Indication: refractory headaches incompletely responsive to other more conservative measures.  Informed consent was obtained after describing risks and benefits of the procedure with the patient. This includes bleeding, bruising, infection, excessive weakness, or medication side effects. A REMS form is on file and signed. Needle: 27g 1/2 inch needle Number of units per muscle  Right temporalis 10 units 2 access points Right frontalis 30 units 4 access points Procerus 20 units 2 access points Left frontalis 30 units 4 access points Left temporalis 10 units 2 access points  All injections were done after  after negative drawback for blood. The patient tolerated the procedure well. Post procedure instructions were given. A followup appointment was made for 3 months  Refilled dilaudid 8mg  1/2-1 tab prn severe migraine

## 2013-01-20 NOTE — Patient Instructions (Signed)
CALL ME WITH ANY PROBLEMS OR QUESTIONS (#[phone]).  HAVE A GOOD DAY  

## 2013-01-27 ENCOUNTER — Ambulatory Visit: Payer: Medicare Other | Admitting: Physical Medicine & Rehabilitation

## 2013-02-19 ENCOUNTER — Ambulatory Visit (INDEPENDENT_AMBULATORY_CARE_PROVIDER_SITE_OTHER): Payer: Medicare Other | Admitting: Internal Medicine

## 2013-02-19 VITALS — BP 108/68 | HR 88 | Temp 97.9°F | Resp 18 | Wt 151.0 lb

## 2013-02-19 DIAGNOSIS — J029 Acute pharyngitis, unspecified: Secondary | ICD-10-CM

## 2013-02-19 DIAGNOSIS — R05 Cough: Secondary | ICD-10-CM

## 2013-02-19 LAB — POCT RAPID STREP A (OFFICE): Rapid Strep A Screen: NEGATIVE

## 2013-02-19 MED ORDER — AZITHROMYCIN 500 MG PO TABS: 500.0000 mg | ORAL_TABLET | Freq: Every day | ORAL | Status: DC

## 2013-02-19 MED ORDER — HYDROCODONE-ACETAMINOPHEN 7.5-325 MG/15ML PO SOLN: 5.0000 mL | Freq: Four times a day (QID) | ORAL | Status: DC | PRN

## 2013-02-19 NOTE — Progress Notes (Signed)
  Subjective:    Patient ID: [name] [name], female    DOB: Jul 24, 1976, 37 y.o.   MRN: [id number]  HPI Patient is here today with a cough that keeps her up at night.  Patient's children are also sick with similar symptoms.  Patient states that she is having some chest tightness but no pain.  It stated out as allergy symptoms early last week.  Her cough is non productive, she has some nasal congestion and a very sore throat.  Patient blowing out yellow mucous.  Patient is taking over the counter cough syrup and mucinex with no relief. Getting worse over one week.   Review of Systems     Objective:   Physical Exam  Constitutional: She is oriented to person, place, and time. She appears well-developed and well-nourished.  HENT:  Right Ear: External ear normal.  Left Ear: External ear normal.  Nose: Mucosal edema, rhinorrhea and sinus tenderness present. Right sinus exhibits no maxillary sinus tenderness and no frontal sinus tenderness. Left sinus exhibits no frontal sinus tenderness.  Mouth/Throat: Oropharyngeal exudate present.  Eyes: EOM are normal. No scleral icterus.  Cardiovascular: Normal rate and normal heart sounds.   Pulmonary/Chest: No respiratory distress. She has no rales. She exhibits no tenderness.  Neurological: She is alert and oriented to person, place, and time. She exhibits normal muscle tone. Coordination normal.  Psychiatric: She has a normal mood and affect.  Coarse BS  Results for orders placed in visit on 02/19/13  POCT RAPID STREP A (OFFICE)      Result Value Range   Rapid Strep A Screen Negative  Negative         Assessment & Plan:  Pharyngitis/Cough

## 2013-02-19 NOTE — Patient Instructions (Signed)
Sinusitis Sinusitis is redness, soreness, and swelling (inflammation) of the paranasal sinuses. Paranasal sinuses are air pockets within the bones of your face (beneath the eyes, the middle of the forehead, or above the eyes). In healthy paranasal sinuses, mucus is able to drain out, and air is able to circulate through them by way of your nose. However, when your paranasal sinuses are inflamed, mucus and air can become trapped. This can allow bacteria and other germs to grow and cause infection. Sinusitis can develop quickly and last only a short time (acute) or continue over a long period (chronic). Sinusitis that lasts for more than 12 weeks is considered chronic.  CAUSES  Causes of sinusitis include:  Allergies.  Structural abnormalities, such as displacement of the cartilage that separates your nostrils (deviated septum), which can decrease the air flow through your nose and sinuses and affect sinus drainage.  Functional abnormalities, such as when the small hairs (cilia) that line your sinuses and help remove mucus do not work properly or are not present. SYMPTOMS  Symptoms of acute and chronic sinusitis are the same. The primary symptoms are pain and pressure around the affected sinuses. Other symptoms include:  Upper toothache.  Earache.  Headache.  Bad breath.  Decreased sense of smell and taste.  A cough, which worsens when you are lying flat.  Fatigue.  Fever.  Thick drainage from your nose, which often is green and may contain pus (purulent).  Swelling and warmth over the affected sinuses. DIAGNOSIS  Your caregiver will perform a physical exam. During the exam, your caregiver may:  Look in your nose for signs of abnormal growths in your nostrils (nasal polyps).  Tap over the affected sinus to check for signs of infection.  View the inside of your sinuses (endoscopy) with a special imaging device with a light attached (endoscope), which is inserted into your  sinuses. If your caregiver suspects that you have chronic sinusitis, one or more of the following tests may be recommended:  Allergy tests.  Nasal culture A sample of mucus is taken from your nose and sent to a lab and screened for bacteria.  Nasal cytology A sample of mucus is taken from your nose and examined by your caregiver to determine if your sinusitis is related to an allergy. TREATMENT  Most cases of acute sinusitis are related to a viral infection and will resolve on their own within 10 days. Sometimes medicines are prescribed to help relieve symptoms (pain medicine, decongestants, nasal steroid sprays, or saline sprays).  However, for sinusitis related to a bacterial infection, your caregiver will prescribe antibiotic medicines. These are medicines that will help kill the bacteria causing the infection.  Rarely, sinusitis is caused by a fungal infection. In theses cases, your caregiver will prescribe antifungal medicine. For some cases of chronic sinusitis, surgery is needed. Generally, these are cases in which sinusitis recurs more than 3 times per year, despite other treatments. HOME CARE INSTRUCTIONS   Drink plenty of water. Water helps thin the mucus so your sinuses can drain more easily.  Use a humidifier.  Inhale steam 3 to 4 times a day (for example, sit in the bathroom with the shower running).  Apply a warm, moist washcloth to your face 3 to 4 times a day, or as directed by your caregiver.  Use saline nasal sprays to help moisten and clean your sinuses.  Take over-the-counter or prescription medicines for pain, discomfort, or fever only as directed by your caregiver. SEEK IMMEDIATE MEDICAL   CARE IF:  You have increasing pain or severe headaches.  You have nausea, vomiting, or drowsiness.  You have swelling around your face.  You have vision problems.  You have a stiff neck.  You have difficulty breathing. MAKE SURE YOU:   Understand these  instructions.  Will watch your condition.  Will get help right away if you are not doing well or get worse. Document Released: 10/08/2005 Document Revised: 12/31/2011 Document Reviewed: 10/23/2011 ExitCare Patient Information 2013 [employer]. Bronchitis Bronchitis is the body's way of reacting to injury and/or infection (inflammation) of the bronchi. Bronchi are the air tubes that extend from the windpipe into the lungs. If the inflammation becomes severe, it may cause shortness of breath. CAUSES  Inflammation may be caused by:  A virus.  Germs (bacteria).  Dust.  Allergens.  Pollutants and many other irritants. The cells lining the bronchial tree are covered with tiny hairs (cilia). These constantly beat upward, away from the lungs, toward the mouth. This keeps the lungs free of pollutants. When these cells become too irritated and are unable to do their job, mucus begins to develop. This causes the characteristic cough of bronchitis. The cough clears the lungs when the cilia are unable to do their job. Without either of these protective mechanisms, the mucus would settle in the lungs. Then you would develop pneumonia. Smoking is a common cause of bronchitis and can contribute to pneumonia. Stopping this habit is the single most important thing you can do to help yourself. TREATMENT   Your caregiver may prescribe an antibiotic if the cough is caused by bacteria. Also, medicines that open up your airways make it easier to breathe. Your caregiver may also recommend or prescribe an expectorant. It will loosen the mucus to be coughed up. Only take over-the-counter or prescription medicines for pain, discomfort, or fever as directed by your caregiver.  Removing whatever causes the problem (smoking, for example) is critical to preventing the problem from getting worse.  Cough suppressants may be prescribed for relief of cough symptoms.  Inhaled medicines may be prescribed to help with  symptoms now and to help prevent problems from returning.  For those with recurrent (chronic) bronchitis, there may be a need for steroid medicines. SEEK IMMEDIATE MEDICAL CARE IF:   During treatment, you develop more pus-like mucus (purulent sputum).  You have a fever.  Your baby is older than 3 months with a rectal temperature of 102 F (38.9 C) or higher.  Your baby is 3 months old or younger with a rectal temperature of 100.4 F (38 C) or higher.  You become progressively more ill.  You have increased difficulty breathing, wheezing, or shortness of breath. It is necessary to seek immediate medical care if you are elderly or sick from any other disease. MAKE SURE YOU:   Understand these instructions.  Will watch your condition.  Will get help right away if you are not doing well or get worse. Document Released: 10/08/2005 Document Revised: 12/31/2011 Document Reviewed: 08/17/2008 ExitCare Patient Information 2013 [employer].  

## 2013-02-19 NOTE — Progress Notes (Signed)
  Subjective:    Patient ID: [name] [name], female    DOB: 1975/12/27, 37 y.o.   MRN: [id number]  HPI    Review of Systems     Objective:   Physical Exam        Assessment & Plan:

## 2013-03-19 LAB — OB RESULTS CONSOLE ANTIBODY SCREEN: Antibody Screen: NEGATIVE

## 2013-03-19 LAB — OB RESULTS CONSOLE HIV ANTIBODY (ROUTINE TESTING): HIV: NONREACTIVE

## 2013-03-19 LAB — OB RESULTS CONSOLE HEPATITIS B SURFACE ANTIGEN: Hepatitis B Surface Ag: NEGATIVE

## 2013-03-19 LAB — OB RESULTS CONSOLE ABO/RH

## 2013-03-19 LAB — OB RESULTS CONSOLE RUBELLA ANTIBODY, IGM: Rubella: IMMUNE

## 2013-04-02 LAB — OB RESULTS CONSOLE GC/CHLAMYDIA
Chlamydia: NEGATIVE
Gonorrhea: NEGATIVE

## 2013-04-11 ENCOUNTER — Inpatient Hospital Stay (HOSPITAL_COMMUNITY)
Admission: AD | Admit: 2013-04-11 | Discharge: 2013-04-11 | Disposition: A | Discharge status: Home / Self Care | Payer: Medicare Other | Source: Ambulatory Visit | Attending: Obstetrics and Gynecology | Admitting: Obstetrics and Gynecology

## 2013-04-11 ENCOUNTER — Encounter (HOSPITAL_COMMUNITY): Payer: Self-pay

## 2013-04-11 DIAGNOSIS — K529 Noninfective gastroenteritis and colitis, unspecified: Secondary | ICD-10-CM

## 2013-04-11 DIAGNOSIS — O99891 Other specified diseases and conditions complicating pregnancy: Secondary | ICD-10-CM | POA: Insufficient documentation

## 2013-04-11 DIAGNOSIS — A088 Other specified intestinal infections: Secondary | ICD-10-CM | POA: Insufficient documentation

## 2013-04-11 DIAGNOSIS — O210 Mild hyperemesis gravidarum: Secondary | ICD-10-CM | POA: Insufficient documentation

## 2013-04-11 DIAGNOSIS — K5289 Other specified noninfective gastroenteritis and colitis: Secondary | ICD-10-CM

## 2013-04-11 LAB — URINALYSIS, ROUTINE W REFLEX MICROSCOPIC
Bilirubin Urine: NEGATIVE
Hgb urine dipstick: NEGATIVE
Ketones, ur: NEGATIVE mg/dL
Nitrite: NEGATIVE
Protein, ur: NEGATIVE mg/dL
Specific Gravity, Urine: 1.02 (ref 1.005–1.030)
Urobilinogen, UA: 1 mg/dL (ref 0.0–1.0)

## 2013-04-11 LAB — URINE MICROSCOPIC-ADD ON

## 2013-04-11 MED ORDER — LACTATED RINGERS IV BOLUS (SEPSIS)
1000.0000 mL | Freq: Once | INTRAVENOUS | Status: AC
  Administered 2013-04-11: 1000 mL via INTRAVENOUS

## 2013-04-11 MED ORDER — ACETAMINOPHEN 500 MG PO TABS: 1000.0000 mg | ORAL_TABLET | Freq: Once | ORAL | Status: DC

## 2013-04-11 NOTE — MAU Note (Signed)
N/V had diarrhea yesterday none today weakness has been feeling movement but not today.

## 2013-04-11 NOTE — MAU Provider Note (Signed)
History     CSN: [id number]  Arrival date and time: 04/11/13 1423   First Provider Initiated Contact with Patient 04/11/13 1515      Chief Complaint  Patient presents with  . Emesis   HPI Ms. [name] [name] is a 37 y.o. [id number] at [redacted]w[redacted]d who presents to MAU today with complaint of dehydration. The patient states that she has had frequent morning sickness throughout the pregnancy. Yesterday she developed diarrhea that lasted ~ 12 hours. She states that the had something at a potluck the night before that she feels didn't agree with her. She called the office and was told to take Immodium. She took 3 doses and diarrhea stopped last night around 5pm. She has been able to hydrate herself today, however still feels weak and feels that she has had decreased urine output since yesterday. She had a "mild" headache now. She denies abdominal pain, bleeding, discharge, UTI symptoms, fever or sick contacts.   OB History   Grav Para Term Preterm Abortions TAB SAB Ect Mult Living   6 2 2  3  3   2       Past Medical History  Diagnosis Date  . Depression   . Anxiety 1999/2000  . Panic disorder 1999/2000  . Seasonal allergies   . Migraines   . GERD (gastroesophageal reflux disease)   . IBS (irritable bowel syndrome)   . Adenomatous colon polyp 2008  . Constipation   . Sciatica   . Hematochezia   . Internal hemorrhoids   . 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 Pituitary surgery  1999    resection of benign pituitary tumor, [facility]  . Colonoscopy  01/03/2007    Dr. Stan Head  . Esophagogastroduodenoscopy  09/29/2004    Dr. Karolee Ohs  . Uterine fibroid surgery      Family History  Problem Relation Age of Onset  . Heart disease Mother   . Diabetes Mother   . Heart disease Father   . Prostate cancer Father   . Heart disease    . Prostate cancer    . Diabetes    . Colon cancer Maternal Grandmother      History  Substance Use Topics  . Smoking status: Never Smoker   . Smokeless tobacco: Never Used  . Alcohol Use: No     Comment: occ.    Allergies:  Allergies  Allergen Reactions  . Latex Rash    Prescriptions prior to admission  Medication Sig Dispense Refill  . Prenatal Vit-Fe Fumarate-FA (PRENATAL MULTIVITAMIN) TABS Take 1 tablet by mouth daily at 12 noon.        Review of Systems  Constitutional: Negative for fever and malaise/fatigue.  Gastrointestinal: Positive for nausea, vomiting and diarrhea. Negative for abdominal pain and constipation.  Genitourinary: Negative for dysuria, urgency and frequency.       Neg - vaginal bleeding, discharge  Neurological: Positive for weakness. Negative for dizziness and loss of consciousness.   Physical Exam   Blood pressure 114/74, pulse 86, temperature 97.9 F (36.6 C), temperature source Oral, resp. rate 18, height 5\' 6"  (1.676 m), weight 150 lb (68.04 kg), last menstrual period 01/09/2013.  Physical Exam  Constitutional: She is oriented to person, place, and time. She appears well-developed and well-nourished. No distress.  HENT:  Head: Normocephalic and atraumatic.  Cardiovascular: Normal rate, regular rhythm and normal heart sounds.   Respiratory: Effort normal  and breath sounds normal. No respiratory distress.  GI: Soft. Bowel sounds are normal. She exhibits no distension and no mass. There is no tenderness. There is no rebound and no guarding.  Neurological: She is alert and oriented to person, place, and time.  Skin: Skin is warm and dry. No erythema.  Psychiatric: She has a normal mood and affect.   Results for orders placed during the hospital encounter of 04/11/13 (from the past 24 hour(s))  URINALYSIS, ROUTINE W REFLEX MICROSCOPIC     Status: Abnormal   Collection Time    04/11/13  2:30 PM      Result Value Range   Color, Urine YELLOW  YELLOW   APPearance CLEAR  CLEAR   Specific Gravity, Urine 1.020  1.005 -  1.030   pH 5.5  5.0 - 8.0   Glucose, UA NEGATIVE  NEGATIVE mg/dL   Hgb urine dipstick NEGATIVE  NEGATIVE   Bilirubin Urine NEGATIVE  NEGATIVE   Ketones, ur NEGATIVE  NEGATIVE mg/dL   Protein, ur NEGATIVE  NEGATIVE mg/dL   Urobilinogen, UA 1.0  0.0 - 1.0 mg/dL   Nitrite NEGATIVE  NEGATIVE   Leukocytes, UA TRACE (*) NEGATIVE  URINE MICROSCOPIC-ADD ON     Status: Abnormal   Collection Time    04/11/13  2:30 PM      Result Value Range   Squamous Epithelial / LPF FEW (*) RARE   WBC, UA 0-2  <3 WBC/hpf   RBC / HPF 0-2  <3 RBC/hpf   Bacteria, UA FEW (*) RARE     MAU Course  Procedures None  MDM Discussed patient with Dr. Thana Ates. 1 L IV LR and 1000 mg PO Tylenol.  Patient reports significant improvement in symptoms.   Assessment and Plan  A: Acute viral gastroenteritis  P: Discharge home Patient encouraged to increase PO hydration as tolerated BRAT diet information on AVS Patient should keep follow-up as scheduled or call for sooner appointment if symptoms worsen or fail to improve Patient may return to MAU as needed  Freddi Starr, PA-C  04/11/2013, 5:25 PM

## 2013-04-11 NOTE — MAU Note (Signed)
Patient voided large amount, stated feels much better.

## 2013-05-19 ENCOUNTER — Encounter: Payer: Medicare Other | Admitting: Physical Medicine & Rehabilitation

## 2013-08-17 ENCOUNTER — Telehealth: Payer: Self-pay | Admitting: *Deleted

## 2013-08-17 NOTE — Telephone Encounter (Signed)
Called about getting a refill on her hydrocodone because she is having headaches.  Zonnie has not been seen since May.  I told her that she can not have refill without being seen in our office since it has been nearly 6 mo.  We offered tomorrow with DR Riley Kill but unable to accept due to baby sitting issues.  She will call back. No refill will be issued.(FYI Epic Chart is notifying of current pregnancy)  Should she not be getting this through her OB?

## 2013-08-17 NOTE — Telephone Encounter (Signed)
Agree. She should be speaking with her ob regarding the use of a narcotic during her pregnancy, i agree

## 2013-09-21 DIAGNOSIS — O2230 Deep phlebothrombosis in pregnancy, unspecified trimester: Secondary | ICD-10-CM

## 2013-09-21 HISTORY — DX: Deep phlebothrombosis in pregnancy, unspecified trimester: O22.30

## 2013-10-08 ENCOUNTER — Encounter (HOSPITAL_COMMUNITY): Payer: Self-pay | Admitting: *Deleted

## 2013-10-08 ENCOUNTER — Inpatient Hospital Stay (HOSPITAL_COMMUNITY)
Admission: AD | Admit: 2013-10-08 | Discharge: 2013-10-10 | DRG: 775 | Disposition: A | Discharge status: Home / Self Care | Payer: Medicare Other | Source: Ambulatory Visit | Attending: Obstetrics and Gynecology | Admitting: Obstetrics and Gynecology

## 2013-10-08 DIAGNOSIS — O9902 Anemia complicating childbirth: Principal | ICD-10-CM | POA: Diagnosis present

## 2013-10-08 DIAGNOSIS — D649 Anemia, unspecified: Secondary | ICD-10-CM | POA: Diagnosis present

## 2013-10-08 LAB — CBC
MCH: 27.3 pg (ref 26.0–34.0)
MCV: 82.6 fL (ref 78.0–100.0)
Platelets: 188 10*3/uL (ref 150–400)
RBC: 3.85 MIL/uL — ABNORMAL LOW (ref 3.87–5.11)
RDW: 14.6 % (ref 11.5–15.5)
WBC: 13.3 10*3/uL — ABNORMAL HIGH (ref 4.0–10.5)

## 2013-10-08 LAB — RPR: RPR Ser Ql: NONREACTIVE

## 2013-10-08 MED ORDER — SIMETHICONE 80 MG PO CHEW: 80.0000 mg | CHEWABLE_TABLET | ORAL | Status: DC | PRN

## 2013-10-08 MED ORDER — BENZOCAINE-MENTHOL 20-0.5 % EX AERO: 1.0000 "application " | INHALATION_SPRAY | CUTANEOUS | Status: DC | PRN

## 2013-10-08 MED ORDER — CITRIC ACID-SODIUM CITRATE 334-500 MG/5ML PO SOLN: 30.0000 mL | ORAL | Status: DC | PRN

## 2013-10-08 MED ORDER — IBUPROFEN 600 MG PO TABS
600.0000 mg | ORAL_TABLET | Freq: Four times a day (QID) | ORAL | Status: DC | PRN
  Administered 2013-10-08: 600 mg via ORAL
  Filled 2013-10-08: qty 1

## 2013-10-08 MED ORDER — OXYTOCIN BOLUS FROM INFUSION: 500.0000 mL | INTRAVENOUS | Status: DC

## 2013-10-08 MED ORDER — OXYTOCIN 10 UNIT/ML IJ SOLN
10.0000 [IU] | Freq: Once | INTRAMUSCULAR | Status: DC
  Administered 2013-10-08: 10 [IU] via INTRAMUSCULAR
  Filled 2013-10-08: qty 1

## 2013-10-08 MED ORDER — IBUPROFEN 800 MG PO TABS
800.0000 mg | ORAL_TABLET | Freq: Three times a day (TID) | ORAL | Status: DC
  Administered 2013-10-09 – 2013-10-10 (×3): 800 mg via ORAL
  Filled 2013-10-08 (×3): qty 1

## 2013-10-08 MED ORDER — DIBUCAINE 1 % RE OINT: 1.0000 "application " | TOPICAL_OINTMENT | RECTAL | Status: DC | PRN

## 2013-10-08 MED ORDER — LIDOCAINE HCL (PF) 1 % IJ SOLN
30.0000 mL | INTRAMUSCULAR | Status: DC | PRN
  Filled 2013-10-08 (×2): qty 30

## 2013-10-08 MED ORDER — LACTATED RINGERS IV SOLN: 500.0000 mL | INTRAVENOUS | Status: DC | PRN

## 2013-10-08 MED ORDER — LANOLIN HYDROUS EX OINT: TOPICAL_OINTMENT | CUTANEOUS | Status: DC | PRN

## 2013-10-08 MED ORDER — WITCH HAZEL-GLYCERIN EX PADS: 1.0000 "application " | MEDICATED_PAD | CUTANEOUS | Status: DC | PRN

## 2013-10-08 MED ORDER — OXYCODONE-ACETAMINOPHEN 5-325 MG PO TABS
1.0000 | ORAL_TABLET | ORAL | Status: DC | PRN
  Administered 2013-10-08: 1 via ORAL
  Filled 2013-10-08: qty 1

## 2013-10-08 MED ORDER — TRAMADOL HCL 50 MG PO TABS
50.0000 mg | ORAL_TABLET | Freq: Four times a day (QID) | ORAL | Status: DC | PRN
  Filled 2013-10-08: qty 1

## 2013-10-08 MED ORDER — OXYTOCIN 40 UNITS IN LACTATED RINGERS INFUSION - SIMPLE MED
62.5000 mL/h | INTRAVENOUS | Status: DC
  Filled 2013-10-08: qty 1000

## 2013-10-08 MED ORDER — ONDANSETRON HCL 4 MG/2ML IJ SOLN: 4.0000 mg | Freq: Four times a day (QID) | INTRAMUSCULAR | Status: DC | PRN

## 2013-10-08 NOTE — H&P (Signed)
OB ADMISSION/ HISTORY & PHYSICAL:  Admission Date: 10/08/2013  1:52 PM  Admit Diagnosis: 38.6 weeks latent labor  [name] [name] is a 37 y.o. female presenting for labor progression. Prodromal ctx x 2-3 days with progressive cervical change from 2cm to 4 cm today - regular ctx at 1300 every 8-10 minutes.  Prenatal History: [id number]   EDC : 10/16/2013, by Last Menstrual Period  Prenatal care at [facility] after third trimester transfer from [facility] for waterbirth Primary Ob Provider: Marlinda Mike CNM Prenatal course uncomplicated  Prenatal Labs: ABO, Rh: A/Positive/-- (05/29 0000) Antibody:  negative Rubella:   Immune RPR: Nonreactive (05/29 0000)  HBsAg: Negative (05/29 0000)  HIV: Non-reactive (05/29 0000)  GTT: NL GBS: Negative (12/18 0000)   Medical / Surgical History :  Past medical history:  Past Medical History  Diagnosis Date  . Depression   . Anxiety 1999/2000  . Panic disorder 1999/2000  . Seasonal allergies   . Migraines   . GERD (gastroesophageal reflux disease)   . IBS (irritable bowel syndrome)   . Adenomatous colon polyp 2008  . Constipation   . Sciatica   . Hematochezia   . Internal hemorrhoids   . 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 Pituitary surgery  1999    resection of benign pituitary tumor, [facility]  . Colonoscopy  01/03/2007    Dr. Stan Head  . Esophagogastroduodenoscopy  09/29/2004    Dr. Karolee Ohs  . Uterine fibroid surgery      Family History:  Family History  Problem Relation Age of Onset  . Heart disease Mother   . Diabetes Mother   . Heart disease Father   . Prostate cancer Father   . Heart disease    . Prostate cancer    . Diabetes    . Colon cancer Maternal Grandmother      Social History:  reports that she has never smoked. She has never used smokeless tobacco. She reports that she does not drink alcohol  or use illicit drugs.  Allergies: Latex   Current Medications at time of admission:  Prior to Admission medications   Medication Sig Start Date End Date Taking? Authorizing Provider  pantoprazole (PROTONIX) 20 MG tablet Take 20 mg by mouth 2 (two) times daily.   Yes Historical Provider, MD  Prenatal Vit-Fe Fumarate-FA (PRENATAL MULTIVITAMIN) TABS Take 1 tablet by mouth daily at 12 noon.   Yes Historical Provider, MD    Review of Systems: Active FM onset of ctx 3 days ago - currently every 8-10 minutes bloody show brown - pink mucus  Physical Exam:  VS: Blood pressure 120/73, pulse 89, temperature 97.6 F (36.4 C), temperature source Axillary, resp. rate 16, height 5\' 6"  (1.676 m), weight 81.647 kg (180 lb), last menstrual period 01/09/2013.  General: alert and oriented, appears comfortable - leaning and rocking with ctx Heart: RRR Lungs: Clear lung fields Abdomen: Gravid, soft and non-tender, non-distended / uterus: gravid and non-tender Extremities: no edema  Genitalia / VE: Dilation: 5 Effacement (%): 90 Station: 0 Exam by:: Marlinda Mike, CNM  FHR: baseline rate 135 / variability moderate / accelerations + / no decelerations TOCO: every 8-10 minutes  Assessment: 38.[redacted] weeks gestation latent stage of labor FHR category 1 with admission EFM   Plan:  Admit AROM Expectant management Water immersion in active labor    Marlinda Mike CNM,  MSN, [facility] 10/08/2013, 6:31 PM

## 2013-10-08 NOTE — Progress Notes (Signed)
S:  Breathing and moaning with ctx - uncomfortable   O:  VS: Blood pressure 120/73, pulse 89, temperature 97.6 F (36.4 C), temperature source Axillary, resp. rate 16, height 5\' 6"  (1.676 m), weight 81.647 kg (180 lb), last menstrual period 01/09/2013.        FHR : baseline 140 / variability moderate / accelerations + / no decelerations        Toco: contractions every 3-4 minutes / moderate         Cervix : 5/90% / vtx 0 station         Membranes: clear fluid / pink color         consent on chart       tub prepared - water temp 100.3  A: active labor      P: water immersion in labor     continuous labor support by CNM     anticipate SVB   Marlinda Mike CNM, MSN, [facility] 10/08/2013, 6:28 PM

## 2013-10-08 NOTE — Progress Notes (Signed)
Subjective:   Comfortable, feeling some ctx. Ambulating in room.  Objective:   VS: Blood pressure 130/83, pulse 113, temperature 97.6 F (36.4 C), temperature source Oral, resp. rate 16, height 5\' 6"  (1.676 m), weight 81.647 kg (180 lb), last menstrual period 01/09/2013. FHR : baseline 150 / variability moderate / accelerations + / no decelerations Toco: irregular ctx Cervix : 4/80/-1 Membranes: AROM-clear/pink, small  Assessment:  Labor: First stage-latent FHR category I  Plan:  Augment with AROM, anticipate spontaneous active labor. Water immersion when active.      Donette Larry, N MSN, CNM 10/08/2013, 3:46 PM

## 2013-10-09 MED ORDER — OXYCODONE-ACETAMINOPHEN 5-325 MG PO TABS
1.0000 | ORAL_TABLET | ORAL | Status: DC | PRN
  Administered 2013-10-09 (×2): 1 via ORAL
  Administered 2013-10-09: 2 via ORAL
  Administered 2013-10-10: 1 via ORAL
  Filled 2013-10-09: qty 1
  Filled 2013-10-09: qty 2
  Filled 2013-10-09 (×2): qty 1

## 2013-10-09 MED ORDER — TETANUS-DIPHTH-ACELL PERTUSSIS 5-2.5-18.5 LF-MCG/0.5 IM SUSP
0.5000 mL | Freq: Once | INTRAMUSCULAR | Status: AC
  Administered 2013-10-10: 0.5 mL via INTRAMUSCULAR
  Filled 2013-10-09: qty 0.5

## 2013-10-09 NOTE — Lactation Note (Signed)
This note was copied from the chart of [name]. Lactation Consultation Note Initial consult.  Mother of 3 breastfed other daughters for 1 year and 9 months.  Mother stated she was having difficulty latching baby, she pulls on and off during feeding.  Mother also complains of nipple soreness.  Mother's nipple pink and crack on base of right nipple.  Comfort gels given and use reviewed.  Additionally reviewed hand expression with mom and benefits of expressed colostrum for nipple soreness.  Assisted mother with football position.  Had mother use hand pump on right side to bring out nipple slightly.  Left nipple fully everted.  Baby pulled on and off nipple.  Switched to cross cradle position.  Baby latched for 5 minutes and pulled off.  Baby possibly gassy.  Burped baby.  [facility] LC resources discussed.  Lactation services reviewed and brochure given.   Patient Name: [name] [id number] Date: 10/09/2013 Reason for consult: Initial assessment   Maternal Data Has patient been taught Hand Expression?: Yes  Feeding Feeding Type: Breast Fed Length of feed: 5 min (baby gassy)  LATCH Score/Interventions Latch: Repeated attempts needed to sustain latch, nipple held in mouth throughout feeding, stimulation needed to elicit sucking reflex. Intervention(s): Adjust position;Assist with latch;Breast massage;Breast compression  Audible Swallowing: A few with stimulation Intervention(s): Skin to skin;Hand expression;Alternate breast massage  Type of Nipple: Everted at rest and after stimulation  Comfort (Breast/Nipple): Filling, red/small blisters or bruises, mild/mod discomfort  Problem noted: Mild/Moderate discomfort Interventions (Mild/moderate discomfort): Hand expression;Reverse pressue;Pre-pump if needed;Comfort gels  Hold (Positioning): Assistance needed to correctly position infant at breast and maintain latch. Intervention(s): Breastfeeding basics reviewed;Support Pillows;Position  options;Skin to skin  LATCH Score: 6  Lactation Tools Discussed/Used Tools: Pump;Comfort gels   Consult Status Consult Status: Follow-up Date: 10/10/13 Follow-up type: In-patient    Dahlia Byes [facility] 10/09/2013, 11:28 PM

## 2013-10-09 NOTE — Progress Notes (Signed)
PPD #1- SVD  Subjective:   Reports feeling well Tolerating po/ No nausea or vomiting Bleeding is light Pain controlled with Motrin and Percocet Up ad lib / ambulatory / voiding without problems Newborn: breastfeeding     Objective:   VS:  VS:  Filed Vitals:   10/08/13 2316 10/08/13 2346 10/09/13 0054 10/09/13 0500  BP: 111/63 120/66 115/63 101/69  Pulse: 114 110 111 96  Temp: 98 F (36.7 C) 98.9 F (37.2 C) 98 F (36.7 C) 98 F (36.7 C)  TempSrc: Oral Oral Oral Oral  Resp: 18 18 18 18   Height:      Weight:        LABS:  Recent Labs  10/08/13 1624  WBC 13.3*  HGB 10.5*  PLT 188   Blood type: A/Positive/-- (05/29 0000) Rubella: Immune (05/29 0000)   I&O: Intake/Output     12/18 0701 - 12/19 0700 12/19 0701 - 12/20 0700   Blood 200    Total Output 200     Net -200          Urine Occurrence 4 x    Emesis Occurrence 2 x      Physical Exam: Alert and oriented x3 Abdomen: soft, non-tender, non-distended  Fundus: firm, non-tender, U-1 Perineum: intact Lochia: small Extremities: no edema, no calf pain or tenderness    Assessment:  PPD # [id number]/ S/P:spontaneous vaginal, waterbirth Mild anemia  Doing well    Plan: Continue routine post partum orders Anticipate D/C home in AM   [place], [name] [name], N MSN, CNM 10/09/2013, 10:05 AM

## 2013-10-09 NOTE — Progress Notes (Signed)
UR chart review completed.  

## 2013-10-10 MED ORDER — OXYCODONE-ACETAMINOPHEN 5-325 MG PO TABS: 1.0000 | ORAL_TABLET | ORAL | Status: DC | PRN

## 2013-10-10 MED ORDER — IBUPROFEN 800 MG PO TABS: 800.0000 mg | ORAL_TABLET | Freq: Three times a day (TID) | ORAL | Status: DC

## 2013-10-10 NOTE — Discharge Summary (Signed)
Obstetric Discharge Summary Reason for Admission: onset of labor Prenatal Procedures: ultrasound Intrapartum Procedures: spontaneous vaginal delivery Postpartum Procedures: none Complications-Operative and Postpartum: none Hemoglobin  Date Value Range Status  10/08/2013 10.5* 12.0 - 15.0 g/dL Final     HCT  Date Value Range Status  10/08/2013 31.8* 36.0 - 46.0 % Final    Physical Exam:  General: alert, cooperative and no distress Lochia: appropriate Uterine Fundus: firm, midline, U-2 DVT Evaluation: No evidence of DVT seen on physical exam. Negative Homan's sign. No cords or calf tenderness. No significant calf/ankle edema. Varicosities bilateral LE - stable  Discharge Diagnoses: Term Pregnancy-delivered        Mild Anemia Discharge Information: Date: 10/10/2013 Activity: pelvic rest Diet: routine Medications: PNV, Ibuprofen and Percocet Condition: stable Instructions: refer to practice specific booklet Discharge to: home Follow-up Information   Follow up with Marlinda Mike, CNM. Schedule an appointment as soon as possible for a visit in 6 weeks.   Specialty:  Obstetrics and Gynecology   Contact information:   [address] [place] [address] [phone]       Newborn Data: Live born female on 10/08/2013 Birth Weight: 8 lb 9.2 oz (3890 g) APGAR: 9, 9  Home with mother.  Kenard Gower, MSN, CNM 10/10/2013, 9:49 AM

## 2013-10-10 NOTE — Progress Notes (Signed)

## 2013-10-10 NOTE — Progress Notes (Addendum)
Patient ID: [name] [name], female   DOB: May 27, 1976, 37 y.o.   MRN: [id number] Post Partum Day #2            Information for the patient's newborn:  [name], [name] [[id number]]  female  Feeding: breast  Subjective: No HA, SOB, CP, F/C, breast symptoms. Pain well-controlled with ibuprofen and percocet. Normal vaginal bleeding, no clots.      Objective:  Temp:  [97.4 F (36.3 C)-98.4 F (36.9 C)] 98.4 F (36.9 C) (12/20 0517) Pulse Rate:  [80-81] 80 (12/20 0517) Resp:  [18] 18 (12/20 0517) BP: (111-124)/(68-69) 111/68 mmHg (12/20 0517) SpO2:  [96 %] 96 % (12/20 0517)  No intake or output data in the 24 hours ending 10/10/13 0946     Recent Labs  10/08/13 1624  WBC 13.3*  HGB 10.5*  HCT 31.8*  PLT 188    Blood type: A/Positive/-- (05/29 0000) Rubella: Immune (05/29 0000)    Physical Exam:  General: alert, cooperative and no distress Uterine Fundus: firm Lochia: appropriate Perineum: intact, no edema DVT Evaluation: No evidence of DVT seen on physical exam. Negative Homan's sign. No cords or calf tenderness. No significant calf/ankle edema. Varicosities on both legs - stable    Assessment/Plan: PPD # 2 / 37 y.o., [id number] S/P: spontaneous vaginal - waterbirth   Active Problems:   Postpartum care following vaginal delivery (12/18)   Normal labor and delivery  Mild Anemia   Normal postpartum exam  Continue current postpartum care  D/C home   LOS: 2 days   [name], M, MSN, CNM 10/10/2013, 9:30 AM

## 2013-10-13 ENCOUNTER — Ambulatory Visit (HOSPITAL_COMMUNITY)
Admission: RE | Admit: 2013-10-13 | Discharge: 2013-10-13 | Disposition: A | Discharge status: Home / Self Care | Payer: Medicare Other | Source: Ambulatory Visit | Attending: Obstetrics and Gynecology | Admitting: Obstetrics and Gynecology

## 2013-10-13 ENCOUNTER — Other Ambulatory Visit (HOSPITAL_COMMUNITY): Payer: Self-pay | Admitting: Obstetrics and Gynecology

## 2013-10-13 DIAGNOSIS — M25562 Pain in left knee: Secondary | ICD-10-CM

## 2013-10-13 DIAGNOSIS — M79609 Pain in unspecified limb: Secondary | ICD-10-CM

## 2013-10-13 DIAGNOSIS — M25569 Pain in unspecified knee: Secondary | ICD-10-CM | POA: Insufficient documentation

## 2013-10-13 NOTE — Progress Notes (Signed)
*  Preliminary Results* Left lower extremity venous duplex completed. Left lower extremity is negative for deep vein thrombosis. 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 There is no evidence of left Baker's cyst. Preliminary results discussed with Dr.Lavoie.  10/13/2013 4:56 PM  Gertie Fey, RVT, RDCS, RDMS

## 2013-11-09 ENCOUNTER — Telehealth: Payer: Self-pay | Admitting: Physical Medicine & Rehabilitation

## 2013-11-09 NOTE — Telephone Encounter (Signed)
Having very bad migraines since giving birth.  Has an appointment 2/20, but couldn't get in sooner.  Wants to try to have a Botox injection when she comes in.  Do you want me to go ahead and work on an approval for that visit?  If so, please let me know what diagnosis code and units.  Also, requesting a break through medication to get her through until her appointment.

## 2013-11-10 NOTE — Telephone Encounter (Signed)
Please go ahead and schedule. Same dx code we've used in the past: 346.01

## 2013-11-11 NOTE — Telephone Encounter (Signed)
Spoke with [name] [name] about her Botox, working on authorization.  Still requesting medication for her migraine.  Says it has been going on for four days.

## 2013-11-12 NOTE — Telephone Encounter (Signed)
imitrex 50mg  po q 2hrs prn migraine. max 2 doses per day #30

## 2013-11-13 ENCOUNTER — Other Ambulatory Visit: Payer: Self-pay | Admitting: Physical Medicine & Rehabilitation

## 2013-11-13 MED ORDER — SUMATRIPTAN SUCCINATE 50 MG PO TABS: 50.0000 mg | ORAL_TABLET | ORAL | Status: DC | PRN

## 2013-11-13 NOTE — Telephone Encounter (Signed)
Left message informing patient that imitrex was called into the pharmacy.

## 2013-12-11 ENCOUNTER — Encounter: Payer: Self-pay | Admitting: Physical Medicine & Rehabilitation

## 2013-12-11 ENCOUNTER — Encounter: Payer: Medicare Other | Attending: Physical Medicine & Rehabilitation | Admitting: Physical Medicine & Rehabilitation

## 2013-12-11 VITALS — BP 119/71 | HR 107 | Resp 14 | Ht 66.0 in | Wt 164.0 lb

## 2013-12-11 DIAGNOSIS — F329 Major depressive disorder, single episode, unspecified: Secondary | ICD-10-CM | POA: Insufficient documentation

## 2013-12-11 DIAGNOSIS — F410 Panic disorder [episodic paroxysmal anxiety] without agoraphobia: Secondary | ICD-10-CM | POA: Insufficient documentation

## 2013-12-11 DIAGNOSIS — M542 Cervicalgia: Secondary | ICD-10-CM | POA: Insufficient documentation

## 2013-12-11 DIAGNOSIS — M545 Low back pain, unspecified: Secondary | ICD-10-CM | POA: Insufficient documentation

## 2013-12-11 DIAGNOSIS — M47816 Spondylosis without myelopathy or radiculopathy, lumbar region: Secondary | ICD-10-CM

## 2013-12-11 DIAGNOSIS — F3289 Other specified depressive episodes: Secondary | ICD-10-CM | POA: Insufficient documentation

## 2013-12-11 DIAGNOSIS — Z860100 Personal history of colon polyps, unspecified: Secondary | ICD-10-CM | POA: Insufficient documentation

## 2013-12-11 DIAGNOSIS — G8929 Other chronic pain: Secondary | ICD-10-CM | POA: Insufficient documentation

## 2013-12-11 DIAGNOSIS — M47817 Spondylosis without myelopathy or radiculopathy, lumbosacral region: Secondary | ICD-10-CM

## 2013-12-11 DIAGNOSIS — G43909 Migraine, unspecified, not intractable, without status migrainosus: Secondary | ICD-10-CM | POA: Insufficient documentation

## 2013-12-11 DIAGNOSIS — K219 Gastro-esophageal reflux disease without esophagitis: Secondary | ICD-10-CM | POA: Insufficient documentation

## 2013-12-11 DIAGNOSIS — G43119 Migraine with aura, intractable, without status migrainosus: Secondary | ICD-10-CM

## 2013-12-11 DIAGNOSIS — IMO0001 Reserved for inherently not codable concepts without codable children: Secondary | ICD-10-CM | POA: Insufficient documentation

## 2013-12-11 MED ORDER — HYDROMORPHONE HCL 4 MG PO TABS: 4.0000 mg | ORAL_TABLET | Freq: Every day | ORAL | Status: DC | PRN

## 2013-12-11 MED ORDER — AMITRIPTYLINE HCL 10 MG PO TABS: 10.0000 mg | ORAL_TABLET | Freq: Every day | ORAL | Status: DC

## 2013-12-11 MED ORDER — SUMATRIPTAN 20 MG/ACT NA SOLN: 20.0000 mg | NASAL | Status: DC | PRN

## 2013-12-11 NOTE — Progress Notes (Signed)
Subjective:    Patient ID: [name] [name], female    DOB: [birth year], 38 y.o.   MRN: [id number]  HPI  Reyonna is back regarding her headaches and fibromyalgia. She just had a third child. She noticed again, while she was pregnant, that her headaches decreased quite a bit. Since having the baby, her pain has worsened. She wanted to have a botox injection, but it's not financially an option at the moment. We tried oral imitrex which helps somewhat. She couldn't tolerate the SQ injection due to side effects.  She found the dilaudid helpful before for the severe headaches and has interest in taking that again.   Sleep remains a problem at times.   Her back is an area of pain. She tries to exercise and work on good posture and ROM.  Otherwise, things are going OK at home with her 3 girls. They all keep her busy.  Pain Inventory Average Pain 8 Pain Right Now 8 My pain is dull, stabbing and aching  In the last 24 hours, has pain interfered with the following? General activity 7 Relation with others 5 Enjoyment of life 5 What TIME of day is your pain at its worst? daytime Sleep (in general) Fair  Pain is worse with: sitting, inactivity, standing and some activites Pain improves with: rest, heat/ice, medication, TENS and injections Relief from Meds: n/a  Mobility walk without assistance Do you have any goals in this area?  no  Function Do you have any goals in this area?  no  Neuro/Psych No problems in this area  Prior Studies Any changes since last visit?  no  Physicians involved in your care Any changes since last visit?  no   Family History  Problem Relation Age of Onset  . Heart disease Mother   . Diabetes Mother   . Heart disease Father   . Prostate cancer Father   . Heart disease    . Prostate cancer    . Diabetes    . Colon cancer Maternal Grandmother    History   Social History  . Marital Status: Married    Spouse Name: N/A    Number of Children: N/A  .  Years of Education: N/A   Social History Main Topics  . Smoking status: Never Smoker   . Smokeless tobacco: Never Used  . Alcohol Use: No     Comment: occ.  . Drug Use: No  . Sexual Activity: Yes   Other Topics Concern  . None   Social History Narrative  . None   Past Surgical History  Procedure Laterality Date  . Pituitary surgery  1999    resection of benign pituitary tumor, [facility]  . Colonoscopy  01/03/2007    Dr. Silvano Rusk  . Esophagogastroduodenoscopy  09/29/2004    Dr. Kennedy Bucker  . Uterine fibroid surgery     Past Medical History  Diagnosis Date  . Depression   . Anxiety 1999/2000  . Panic disorder 1999/2000  . Seasonal allergies   . Migraines   . GERD (gastroesophageal reflux disease)   . IBS (irritable bowel syndrome)   . Adenomatous colon polyp 2008  . Constipation   . Sciatica   . Hematochezia   . Internal hemorrhoids   . 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  Neurological: Positive for headaches.  All other systems reviewed and are negative.       Objective:   Physical Exam  Constitutional: She is oriented to person, place, and time. She appears well-developed and well-nourished.  HENT:  Head: Normocephalic and atraumatic.  Eyes: Pupils are equal, round, and reactive to light.  Cardiovascular: Normal rate and regular rhythm.  Pulmonary/Chest: Effort normal and breath sounds normal. No respiratory distress. She has no wheezes.  Abdominal: Soft.  Musculoskeletal:  Some elevation of right pelvis. Mild levoscoliosis of the back with apex at T12.  Neurological: She is alert and oriented to person, place, and time. No cranial nerve deficit.  Coordination normal.  Psychiatric: She has a normal mood and affect. Her behavior is normal. Judgment and thought content normal. Good spirits. Assessment & Plan:   ASSESSMENT:  1. Migraine headaches.  2. Fibromyalgia syndrome.  3. Depression.  4. Chronic low back pain and cervicalgia. The patient with myofascial  and spondylosis components.    PLAN:  1. Continue to work on posture and exercise as possible at home.  2. Consider botox when financially feasible.  3. We will try nasal imitrex for breakthrough migraines 20 mg daily p.r.n. She may also continue with the oral imitrex 4. Dilaudid for severe breakthrough pain, 4mg  qd prn #20. 5. DHEA is an option for her given hormonally mediated headaches and fibro symptoms.   6. I will see her back pending injections above--for now I have scheduled her at 3 months.

## 2013-12-11 NOTE — Patient Instructions (Signed)
PLEASE CALL ME WITH ANY PROBLEMS OR QUESTIONS (#[phone]).      

## 2013-12-18 ENCOUNTER — Telehealth: Payer: Self-pay

## 2013-12-18 NOTE — Telephone Encounter (Signed)
Patient called and stated her pharmacy did not have her RX for Amitriptyline. I contacted [facility] on [address] and they said that patient's RX was ready for pickup. Patient is aware.

## 2013-12-28 ENCOUNTER — Other Ambulatory Visit: Payer: Self-pay | Admitting: Obstetrics and Gynecology

## 2014-01-08 ENCOUNTER — Telehealth: Payer: Self-pay | Admitting: *Deleted

## 2014-01-08 DIAGNOSIS — IMO0001 Reserved for inherently not codable concepts without codable children: Secondary | ICD-10-CM

## 2014-01-08 DIAGNOSIS — G43119 Migraine with aura, intractable, without status migrainosus: Secondary | ICD-10-CM

## 2014-01-08 MED ORDER — HYDROMORPHONE HCL 4 MG PO TABS: 4.0000 mg | ORAL_TABLET | Freq: Every day | ORAL | Status: DC | PRN

## 2014-01-08 NOTE — Telephone Encounter (Signed)
[name] [name] called for a refill on her Dilaudid 4mg .  Last given 12/11/13 # 20 (printed)  Next appt is in May. Should she be being seen for this refill?

## 2014-01-08 NOTE — Telephone Encounter (Signed)
She may pick up

## 2014-01-08 NOTE — Telephone Encounter (Signed)
Notified her RX is available for pick up

## 2014-02-04 ENCOUNTER — Telehealth: Payer: Self-pay

## 2014-02-04 DIAGNOSIS — G43119 Migraine with aura, intractable, without status migrainosus: Secondary | ICD-10-CM

## 2014-02-04 DIAGNOSIS — IMO0001 Reserved for inherently not codable concepts without codable children: Secondary | ICD-10-CM

## 2014-02-04 NOTE — Telephone Encounter (Signed)
Patient states she has a migraine headache and the Imitrex is not helping with the pain. Patient said she is out of Hydromorphone and would like a refill is that okay?

## 2014-02-04 NOTE — Telephone Encounter (Signed)
Yes she may have a refill---remind me tomorrow

## 2014-02-05 MED ORDER — HYDROMORPHONE HCL 4 MG PO TABS: 4.0000 mg | ORAL_TABLET | Freq: Every day | ORAL | Status: DC | PRN

## 2014-02-05 NOTE — Telephone Encounter (Signed)
Hydromorphone rx printed for Dr [name] to sign.  Will contact patient when ready.

## 2014-02-05 NOTE — Telephone Encounter (Signed)
Notified rx is available for pick up. 

## 2014-03-09 ENCOUNTER — Encounter: Payer: Medicare Other | Attending: Physical Medicine & Rehabilitation | Admitting: Physical Medicine & Rehabilitation

## 2014-03-09 ENCOUNTER — Encounter: Payer: Self-pay | Admitting: Physical Medicine & Rehabilitation

## 2014-03-09 ENCOUNTER — Other Ambulatory Visit: Payer: Self-pay | Admitting: Physical Medicine & Rehabilitation

## 2014-03-09 VITALS — BP 119/70 | HR 99 | Resp 14 | Ht 66.0 in | Wt 165.4 lb

## 2014-03-09 DIAGNOSIS — R5381 Other malaise: Secondary | ICD-10-CM | POA: Insufficient documentation

## 2014-03-09 DIAGNOSIS — IMO0001 Reserved for inherently not codable concepts without codable children: Secondary | ICD-10-CM | POA: Insufficient documentation

## 2014-03-09 DIAGNOSIS — G43119 Migraine with aura, intractable, without status migrainosus: Secondary | ICD-10-CM | POA: Insufficient documentation

## 2014-03-09 DIAGNOSIS — M47817 Spondylosis without myelopathy or radiculopathy, lumbosacral region: Secondary | ICD-10-CM | POA: Insufficient documentation

## 2014-03-09 DIAGNOSIS — Z5181 Encounter for therapeutic drug level monitoring: Secondary | ICD-10-CM | POA: Insufficient documentation

## 2014-03-09 DIAGNOSIS — R5383 Other fatigue: Secondary | ICD-10-CM

## 2014-03-09 DIAGNOSIS — M47816 Spondylosis without myelopathy or radiculopathy, lumbar region: Secondary | ICD-10-CM

## 2014-03-09 DIAGNOSIS — M8580 Other specified disorders of bone density and structure, unspecified site: Secondary | ICD-10-CM

## 2014-03-09 DIAGNOSIS — Z79899 Other long term (current) drug therapy: Secondary | ICD-10-CM | POA: Insufficient documentation

## 2014-03-09 DIAGNOSIS — M949 Disorder of cartilage, unspecified: Secondary | ICD-10-CM

## 2014-03-09 DIAGNOSIS — M899 Disorder of bone, unspecified: Secondary | ICD-10-CM | POA: Insufficient documentation

## 2014-03-09 DIAGNOSIS — E559 Vitamin D deficiency, unspecified: Secondary | ICD-10-CM | POA: Insufficient documentation

## 2014-03-09 LAB — COMPREHENSIVE METABOLIC PANEL
ALK PHOS: 60 U/L (ref 39–117)
ALT: 21 U/L (ref 0–35)
AST: 20 U/L (ref 0–37)
Albumin: 4.6 g/dL (ref 3.5–5.2)
BUN: 7 mg/dL (ref 6–23)
CO2: 23 mEq/L (ref 19–32)
Calcium: 10.4 mg/dL (ref 8.4–10.5)
Chloride: 106 mEq/L (ref 96–112)
Creat: 0.7 mg/dL (ref 0.50–1.10)
GLUCOSE: 89 mg/dL (ref 70–99)
Potassium: 4.3 mEq/L (ref 3.5–5.3)
SODIUM: 139 meq/L (ref 135–145)
Total Bilirubin: 0.7 mg/dL (ref 0.2–1.2)
Total Protein: 7.8 g/dL (ref 6.0–8.3)

## 2014-03-09 LAB — T3: T3 TOTAL: 90.7 ng/dL (ref 80.0–204.0)

## 2014-03-09 LAB — T4: T4, Total: 7 ug/dL (ref 5.0–12.5)

## 2014-03-09 LAB — T4, FREE: FREE T4: 1.08 ng/dL (ref 0.80–1.80)

## 2014-03-09 LAB — MAGNESIUM: Magnesium: 1.8 mg/dL (ref 1.5–2.5)

## 2014-03-09 MED ORDER — HYDROMORPHONE HCL 4 MG PO TABS
4.0000 mg | ORAL_TABLET | Freq: Every day | ORAL | Status: DC | PRN
Start: 2014-03-09 — End: 2014-04-12

## 2014-03-09 MED ORDER — HYDROMORPHONE HCL 4 MG PO TABS: 4.0000 mg | ORAL_TABLET | Freq: Every day | ORAL | Status: DC | PRN

## 2014-03-09 NOTE — Addendum Note (Signed)
Addended by: Caro Hight on: 03/09/2014 12:24 PM   Modules accepted: Orders

## 2014-03-09 NOTE — Progress Notes (Signed)
Subjective:    Patient ID: [name] [name], female    DOB: [birth year], 38 y.o.   MRN: [id number]  HPI  [name] is back regarding her chronic pain, headaches. She finds that her headaches seem to increase with each menstrual period. There are also weather and psychosocial stimuli---she has 3 girls currently 5,4,66months.   The nasal imitrex works fairly well. It causes less GI SE.  The IM imitrex causes GI upset. She has backed off ibuprofen because it also causes nausea. She had to stop the low dose elavil due to sedation in the am.  She is trying to exercise but finds it difficult currrently with the 3 little ones at home.   Overall lack of energy remains an issue in association with her general body pain and headaches. Botox injections have been helpful for headaches int he past.     Pain Inventory Average Pain 7 Pain Right Now 7 My pain is intermittent, sharp, dull, stabbing and aching  In the last 24 hours, has pain interfered with the following? General activity 7 Relation with others 6 Enjoyment of life 5 What TIME of day is your pain at its worst? varies Sleep (in general) Fair  Pain is worse with: sitting, inactivity, standing and some activites Pain improves with: rest, heat/ice, therapy/exercise, medication and injections Relief from Meds: 7  Mobility walk without assistance  Function disabled: date disabled .  Neuro/Psych No problems in this area  Prior Studies Any changes since last visit?  no  Physicians involved in your care Any changes since last visit?  no   Family History  Problem Relation Age of Onset  . Heart disease Mother   . Diabetes Mother   . Heart disease Father   . Prostate cancer Father   . Heart disease    . Prostate cancer    . Diabetes    . Colon cancer Maternal Grandmother    History   Social History  . Marital Status: Married    Spouse Name: N/A    Number of Children: N/A  . Years of Education: N/A   Social History Main  Topics  . Smoking status: Never Smoker   . Smokeless tobacco: Never Used  . Alcohol Use: No     Comment: occ.  . Drug Use: No  . Sexual Activity: Yes   Other Topics Concern  . None   Social History Narrative  . None   Past Surgical History  Procedure Laterality Date  . Pituitary surgery  1999    resection of benign pituitary tumor, [facility]  . Colonoscopy  01/03/2007    Dr. Silvano Rusk  . Esophagogastroduodenoscopy  09/29/2004    Dr. Kennedy Bucker  . Uterine fibroid surgery     Past Medical History  Diagnosis Date  . Depression   . Anxiety 1999/2000  . Panic disorder 1999/2000  . Seasonal allergies   . Migraines   . GERD (gastroesophageal reflux disease)   . IBS (irritable bowel syndrome)   . Adenomatous colon polyp 2008  . Constipation   . Sciatica   . Hematochezia   . Internal hemorrhoids   . 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  All other systems reviewed and are negative.      Objective:   Physical Exam Constitutional: She is oriented to person, place, and time. She appears well-developed and well-nourished.  HENT:  Head: Normocephalic and atraumatic.  Eyes: Pupils are equal, round, and reactive to light.  Cardiovascular: Normal rate and regular rhythm.  Pulmonary/Chest: Effort normal and breath sounds normal. No respiratory distress. She has no wheezes.  Abdominal: Soft.  Musculoskeletal:  Some elevation of right pelvis. Mild levoscoliosis of the back with apex at T12.  Neurological: She is alert and oriented to person, place, and time. No cranial nerve deficit. Coordination normal.  Psychiatric: She has a normal mood and affect. Her behavior is normal. Judgment  and thought content normal. Good spirits.    Assessment & Plan:   ASSESSMENT:  1. Migraine headaches.  2. Fibromyalgia syndrome.  3. Depression.  4. Chronic low back pain and cervicalgia. The patient with myofascial  and spondylosis components.    PLAN:  1. Given her ongoing fatigue, I will re-check a CMET, CBC, mg++, T4, T3, and hopefullly a B12 and Vitamin D level as well (insurance permitting).  2. Tentatively plan botox in about 3 months.   3. Continue with nasal imitrex for breakthrough migraines 20 mg daily p.r.n. She may use the im form for more severe h'as.  4. Dilaudid for severe breakthrough pain, 4-6mg  qd prn #30.  5. DHEA is still an option for her given hormonally mediated headaches and fibro symptoms. Recommend continuing her omega 3 also 6. I will see her back pending injections above--for now I have scheduled her at 3 months.

## 2014-03-09 NOTE — Patient Instructions (Signed)
PLEASE CALL ME WITH ANY PROBLEMS OR QUESTIONS (#[phone]).      

## 2014-03-10 ENCOUNTER — Telehealth: Payer: Self-pay

## 2014-03-10 DIAGNOSIS — M899 Disorder of bone, unspecified: Secondary | ICD-10-CM

## 2014-03-10 DIAGNOSIS — M949 Disorder of cartilage, unspecified: Secondary | ICD-10-CM

## 2014-03-10 DIAGNOSIS — R5381 Other malaise: Secondary | ICD-10-CM

## 2014-03-10 DIAGNOSIS — E559 Vitamin D deficiency, unspecified: Secondary | ICD-10-CM

## 2014-03-10 DIAGNOSIS — R5383 Other fatigue: Principal | ICD-10-CM

## 2014-03-10 NOTE — Telephone Encounter (Signed)
I called and spoke with [name] [name] about her Vit D level test.  Because she has medicare the test cannot be done under diagnosis code without her being charged for the lab (no medical necessity code for her diagnosis).  I am going to check with [facility] for the cost out of pocket for a vit d level.  I also informed her that her other labs drawn were within the normal range.  We did not get the cbc yesterday, my mistake I did not draw a purple top tube.  I have asked that at her convenience she drop back by the office this week or next to have the blood for CBC drawn.  After I spoke with her I called [facility] and found that to have a vit d drawn and billed would be $155 but if she pays at time of service (ie go to [place] to have it drawn and pay at that time, it will be $77)  I will tell her this and see if she would like to have it drawn and just go by [facility] for the CBC as well.  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

## 2014-03-10 NOTE — Telephone Encounter (Signed)
Patient called to let us know that she has not had vitamin D level checked since 2012.

## 2014-03-12 ENCOUNTER — Telehealth: Payer: Self-pay | Admitting: Physical Medicine & Rehabilitation

## 2014-03-12 NOTE — Telephone Encounter (Signed)
Attempted to contact patient. Left a voicemail to return call to clinic. After leaving the voicemail Sybil states she spoke with patient and informed her of the lab results.

## 2014-03-12 NOTE — Telephone Encounter (Signed)
Please let Kayleen know that the labs we tested all look appropriate. I would not change any of her supplements or meds at this time

## 2014-03-19 NOTE — Progress Notes (Signed)
Urine drug screen from 03/09/2014 was consistent.

## 2014-04-12 ENCOUNTER — Telehealth: Payer: Self-pay

## 2014-04-12 DIAGNOSIS — G43111 Migraine with aura, intractable, with status migrainosus: Secondary | ICD-10-CM

## 2014-04-12 DIAGNOSIS — IMO0001 Reserved for inherently not codable concepts without codable children: Secondary | ICD-10-CM

## 2014-04-12 MED ORDER — HYDROMORPHONE HCL 4 MG PO TABS: 4.0000 mg | ORAL_TABLET | Freq: Every day | ORAL | Status: DC | PRN

## 2014-04-12 NOTE — Telephone Encounter (Signed)
Left message advising patient she can come pick up hydromorphone rx.

## 2014-04-12 NOTE — Telephone Encounter (Signed)
Rx for hydromorphone printed.  Will contact patient when ready for pick up.

## 2014-04-12 NOTE — Telephone Encounter (Signed)
Patient called requesting hydromorphone refill.

## 2014-05-10 ENCOUNTER — Telehealth: Payer: Self-pay

## 2014-05-10 DIAGNOSIS — IMO0001 Reserved for inherently not codable concepts without codable children: Secondary | ICD-10-CM

## 2014-05-10 DIAGNOSIS — G43111 Migraine with aura, intractable, with status migrainosus: Secondary | ICD-10-CM

## 2014-05-10 MED ORDER — HYDROMORPHONE HCL 4 MG PO TABS: 4.0000 mg | ORAL_TABLET | Freq: Every day | ORAL | Status: DC | PRN

## 2014-05-10 NOTE — Telephone Encounter (Signed)
Patient called requesting hydromorphone refill.  Rx printed.  Patient will pick up tomorrow.

## 2014-06-14 ENCOUNTER — Telehealth: Payer: Self-pay

## 2014-06-14 DIAGNOSIS — G43111 Migraine with aura, intractable, with status migrainosus: Secondary | ICD-10-CM

## 2014-06-14 DIAGNOSIS — IMO0001 Reserved for inherently not codable concepts without codable children: Secondary | ICD-10-CM

## 2014-06-14 MED ORDER — HYDROMORPHONE HCL 4 MG PO TABS: 4.0000 mg | ORAL_TABLET | Freq: Every day | ORAL | Status: DC | PRN

## 2014-06-14 NOTE — Telephone Encounter (Signed)
Patient is requesting a refill on Hydromorphone. RX printed to be signed. Patient has appt on 9/15. Will contact patient when ready for pickup.

## 2014-06-14 NOTE — Telephone Encounter (Signed)
Attempted to contact patient. Left a voicemail to inform patient her RX will be ready for pickup this afternoon.

## 2014-07-06 ENCOUNTER — Ambulatory Visit: Payer: Medicare Other | Admitting: Physical Medicine & Rehabilitation

## 2014-07-09 ENCOUNTER — Telehealth: Payer: Self-pay

## 2014-07-09 NOTE — Telephone Encounter (Signed)
Patient is requesting a refill on Hydrocodone. Last OV was 5/22. Attempted to contact patient to inform her she would need a appt with Zella Ball for a medication refill. Left a voicemail.

## 2014-07-12 ENCOUNTER — Encounter: Payer: Self-pay | Admitting: Registered Nurse

## 2014-07-12 ENCOUNTER — Encounter: Payer: Medicare Other | Attending: Registered Nurse | Admitting: Registered Nurse

## 2014-07-12 VITALS — BP 110/52 | HR 80 | Resp 15 | Ht 66.0 in | Wt 157.0 lb

## 2014-07-12 DIAGNOSIS — M545 Low back pain, unspecified: Secondary | ICD-10-CM | POA: Diagnosis not present

## 2014-07-12 DIAGNOSIS — F329 Major depressive disorder, single episode, unspecified: Secondary | ICD-10-CM | POA: Insufficient documentation

## 2014-07-12 DIAGNOSIS — IMO0001 Reserved for inherently not codable concepts without codable children: Secondary | ICD-10-CM | POA: Insufficient documentation

## 2014-07-12 DIAGNOSIS — G43111 Migraine with aura, intractable, with status migrainosus: Secondary | ICD-10-CM | POA: Insufficient documentation

## 2014-07-12 DIAGNOSIS — G8929 Other chronic pain: Secondary | ICD-10-CM | POA: Insufficient documentation

## 2014-07-12 DIAGNOSIS — F3289 Other specified depressive episodes: Secondary | ICD-10-CM | POA: Insufficient documentation

## 2014-07-12 MED ORDER — HYDROMORPHONE HCL 4 MG PO TABS: 4.0000 mg | ORAL_TABLET | Freq: Every day | ORAL | Status: DC | PRN

## 2014-07-12 MED ORDER — OMEGA-3-ACID ETHYL ESTERS 1 G PO CAPS: ORAL_CAPSULE | ORAL | Status: DC

## 2014-07-12 MED ORDER — HYDROMORPHONE HCL 4 MG PO TABS
4.0000 mg | ORAL_TABLET | Freq: Every day | ORAL | Status: DC | PRN
Start: 2014-07-12 — End: 2014-07-28

## 2014-07-12 NOTE — Progress Notes (Signed)
Subjective:    Patient ID: [name] [name], female    DOB: 07-08-1976, 38 y.o.   MRN: [id number]  HPI: Mrs. [name] [name] is a 37 year old female who returns for follow up for chronic pain and medication refill. She says her pain is located in her neck,mid-back,bilateral hips and right leg. She rates her pain 7. Her current exercise regime is swimming, yoga, and walking. She has three children 6, 41/57, and a 17 month old. She will be speaking to office regarding the cost of Botox today, her appointment has been scheduled for Botox treatment next visit.  Pain Inventory Average Pain 5 Pain Right Now 7 My pain is dull, constant  In the last 24 hours, has pain interfered with the following? General activity 6 Relation with others 3 Enjoyment of life 3 What TIME of day is your pain at its worst? 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  no  Physicians involved in your care Any changes since last visit?  no   Family History  Problem Relation Age of Onset  . Heart disease Mother   . Diabetes Mother   . Heart disease Father   . Prostate cancer Father   . Heart disease    . Prostate cancer    . Diabetes    . Colon cancer Maternal Grandmother    History   Social History  . Marital Status: Married    Spouse Name: N/A    Number of Children: N/A  . Years of Education: N/A   Social History Main Topics  . Smoking status: Never Smoker   . Smokeless tobacco: Never Used  . Alcohol Use: No     Comment: occ.  . Drug Use: No  . Sexual Activity: Yes   Other Topics Concern  . None   Social History Narrative  . None   Past Surgical  History  Procedure Laterality Date  . Pituitary surgery  1999    resection of benign pituitary tumor, [facility]  . Colonoscopy  01/03/2007    Dr. Silvano Rusk  . Esophagogastroduodenoscopy  09/29/2004    Dr. Kennedy Bucker  . Uterine fibroid surgery     Past Medical History  Diagnosis Date  . Depression   . Anxiety 1999/2000  . Panic disorder 1999/2000  . Seasonal allergies   . Migraines   . GERD (gastroesophageal reflux disease)   . IBS (irritable bowel syndrome)   . Adenomatous colon polyp 2008  . Constipation   . Sciatica   . Hematochezia   . Internal hemorrhoids   . 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 Constitutional: She is oriented to person, place, and time. She appears well-developed and well-nourished.  HENT:  Head: Normocephalic and atraumatic.  Neck: Normal range of motion. Neck supple.  Cervical Paraspinal Tenderness: C-3- C-5  Cardiovascular: Normal rate and regular rhythm.   Pulmonary/Chest: Effort normal and breath sounds normal.  Musculoskeletal:  Normal Muscle Bulk and Muscle testing Reveals: Upper Extremities: Full ROM and Muscle Strength 5/5 Thoracic and Lumbar Hypersensitivity Lower Extremities: Full ROM and Muscle Strength 5/5 Right Leg Flexion Produces Pain into Lumbar and radiates laterally down lower extremity Arises from chair with ease Narrow based gait.  Neurological: She is alert and oriented to person, place, and time.  Skin: Skin is warm and dry.  Psychiatric: She has a normal mood and affect.          Assessment & Plan:  1. Migraine headaches. Refilled Dilaudid 4-6mg  qd prn. Second script given. Botox scheduled for next visit.  2. Fibromyalgia syndrome.  Continue with exercise regime 3. Depression: Continue to Monitor 4. Chronic low back pain and cervicalgia. The patient with myofascial and spondylosis components. Continue with exercise and heat therapy.  20 minutes of face to face patient care time was spent during this visit. All questions were encouraged and answered.  F/U in 3 months

## 2014-07-28 ENCOUNTER — Encounter (HOSPITAL_COMMUNITY): Payer: Medicare Other | Admitting: Anesthesiology

## 2014-07-28 ENCOUNTER — Observation Stay (HOSPITAL_COMMUNITY)
Admission: EM | Admit: 2014-07-28 | Discharge: 2014-07-29 | Disposition: A | Discharge status: Home / Self Care | Payer: Medicare Other | Attending: General Surgery | Admitting: General Surgery

## 2014-07-28 ENCOUNTER — Encounter (HOSPITAL_COMMUNITY): Payer: Self-pay | Admitting: Emergency Medicine

## 2014-07-28 ENCOUNTER — Emergency Department (HOSPITAL_COMMUNITY): Payer: Medicare Other

## 2014-07-28 ENCOUNTER — Emergency Department (HOSPITAL_COMMUNITY): Payer: Medicare Other | Admitting: Anesthesiology

## 2014-07-28 ENCOUNTER — Encounter (HOSPITAL_COMMUNITY)
Admission: EM | Disposition: A | Discharge status: Home / Self Care | Payer: Self-pay | Source: Home / Self Care | Attending: Emergency Medicine

## 2014-07-28 DIAGNOSIS — Z79899 Other long term (current) drug therapy: Secondary | ICD-10-CM | POA: Diagnosis not present

## 2014-07-28 DIAGNOSIS — G43909 Migraine, unspecified, not intractable, without status migrainosus: Secondary | ICD-10-CM | POA: Insufficient documentation

## 2014-07-28 DIAGNOSIS — G709 Myoneural disorder, unspecified: Secondary | ICD-10-CM | POA: Diagnosis not present

## 2014-07-28 DIAGNOSIS — K589 Irritable bowel syndrome without diarrhea: Secondary | ICD-10-CM | POA: Insufficient documentation

## 2014-07-28 DIAGNOSIS — R1031 Right lower quadrant pain: Secondary | ICD-10-CM | POA: Diagnosis present

## 2014-07-28 DIAGNOSIS — Z793 Long term (current) use of hormonal contraceptives: Secondary | ICD-10-CM | POA: Diagnosis not present

## 2014-07-28 DIAGNOSIS — K353 Acute appendicitis with localized peritonitis: Principal | ICD-10-CM | POA: Insufficient documentation

## 2014-07-28 DIAGNOSIS — K219 Gastro-esophageal reflux disease without esophagitis: Secondary | ICD-10-CM | POA: Insufficient documentation

## 2014-07-28 DIAGNOSIS — K3580 Unspecified acute appendicitis: Secondary | ICD-10-CM | POA: Diagnosis present

## 2014-07-28 HISTORY — DX: Personal history of other diseases of the digestive system: Z87.19

## 2014-07-28 HISTORY — DX: Unspecified osteoarthritis, unspecified site: M19.90

## 2014-07-28 HISTORY — DX: Other complications of anesthesia, initial encounter: T88.59XA

## 2014-07-28 HISTORY — PX: LAPAROSCOPIC APPENDECTOMY: SHX408

## 2014-07-28 HISTORY — DX: Sleep apnea, unspecified: G47.30

## 2014-07-28 HISTORY — DX: Adverse effect of unspecified anesthetic, initial encounter: T41.45XA

## 2014-07-28 HISTORY — DX: Deep phlebothrombosis in pregnancy, unspecified trimester: O22.30

## 2014-07-28 HISTORY — DX: Benign neoplasm of pituitary gland: D35.2

## 2014-07-28 HISTORY — PX: APPENDECTOMY: SHX54

## 2014-07-28 HISTORY — DX: Fibromyalgia: M79.7

## 2014-07-28 LAB — CBC WITH DIFFERENTIAL/PLATELET
Basophils Absolute: 0 10*3/uL (ref 0.0–0.1)
Basophils Relative: 0 % (ref 0–1)
EOS ABS: 0.1 10*3/uL (ref 0.0–0.7)
EOS PCT: 1 % (ref 0–5)
HCT: 40.6 % (ref 36.0–46.0)
HEMOGLOBIN: 13.4 g/dL (ref 12.0–15.0)
LYMPHS ABS: 1 10*3/uL (ref 0.7–4.0)
LYMPHS PCT: 6 % — AB (ref 12–46)
MCH: 28.5 pg (ref 26.0–34.0)
MCHC: 33 g/dL (ref 30.0–36.0)
MCV: 86.4 fL (ref 78.0–100.0)
MONOS PCT: 6 % (ref 3–12)
Monocytes Absolute: 1.1 10*3/uL — ABNORMAL HIGH (ref 0.1–1.0)
Neutro Abs: 14.9 10*3/uL — ABNORMAL HIGH (ref 1.7–7.7)
Neutrophils Relative %: 87 % — ABNORMAL HIGH (ref 43–77)
Platelets: 279 10*3/uL (ref 150–400)
RBC: 4.7 MIL/uL (ref 3.87–5.11)
RDW: 13.5 % (ref 11.5–15.5)
WBC: 17.1 10*3/uL — ABNORMAL HIGH (ref 4.0–10.5)

## 2014-07-28 LAB — COMPREHENSIVE METABOLIC PANEL
ALK PHOS: 49 U/L (ref 39–117)
ALT: 12 U/L (ref 0–35)
AST: 18 U/L (ref 0–37)
Albumin: 4.7 g/dL (ref 3.5–5.2)
Anion gap: 10 (ref 5–15)
BUN: 8 mg/dL (ref 6–23)
CALCIUM: 10 mg/dL (ref 8.4–10.5)
CO2: 26 meq/L (ref 19–32)
Chloride: 102 mEq/L (ref 96–112)
Creatinine, Ser: 0.6 mg/dL (ref 0.50–1.10)
GLUCOSE: 110 mg/dL — AB (ref 70–99)
Potassium: 3.9 mEq/L (ref 3.7–5.3)
Sodium: 138 mEq/L (ref 137–147)
TOTAL PROTEIN: 8 g/dL (ref 6.0–8.3)
Total Bilirubin: 0.8 mg/dL (ref 0.3–1.2)

## 2014-07-28 LAB — URINALYSIS, ROUTINE W REFLEX MICROSCOPIC
BILIRUBIN URINE: NEGATIVE
GLUCOSE, UA: NEGATIVE mg/dL
Hgb urine dipstick: NEGATIVE
KETONES UR: NEGATIVE mg/dL
Leukocytes, UA: NEGATIVE
Nitrite: NEGATIVE
PH: 6.5 (ref 5.0–8.0)
PROTEIN: NEGATIVE mg/dL
Specific Gravity, Urine: 1.013 (ref 1.005–1.030)
Urobilinogen, UA: 0.2 mg/dL (ref 0.0–1.0)

## 2014-07-28 LAB — PREGNANCY, URINE: PREG TEST UR: NEGATIVE

## 2014-07-28 LAB — LIPASE, BLOOD: Lipase: 16 U/L (ref 11–59)

## 2014-07-28 SURGERY — APPENDECTOMY, LAPAROSCOPIC
Anesthesia: General | Site: Abdomen

## 2014-07-28 MED ORDER — FENTANYL CITRATE 0.05 MG/ML IJ SOLN
INTRAMUSCULAR | Status: DC | PRN
  Administered 2014-07-28: 50 ug via INTRAVENOUS
  Administered 2014-07-28: 150 ug via INTRAVENOUS
  Administered 2014-07-28: 50 ug via INTRAVENOUS

## 2014-07-28 MED ORDER — BUPIVACAINE-EPINEPHRINE (PF) 0.25% -1:200000 IJ SOLN
INTRAMUSCULAR | Status: AC
  Filled 2014-07-28: qty 30

## 2014-07-28 MED ORDER — HYDROMORPHONE HCL 1 MG/ML IJ SOLN
1.0000 mg | Freq: Once | INTRAMUSCULAR | Status: AC
Start: 2014-07-28 — End: 2014-07-28
  Administered 2014-07-28: 1 mg via INTRAVENOUS
  Filled 2014-07-28: qty 1

## 2014-07-28 MED ORDER — HEPARIN SODIUM (PORCINE) 5000 UNIT/ML IJ SOLN
5000.0000 [IU] | Freq: Three times a day (TID) | INTRAMUSCULAR | Status: DC
  Administered 2014-07-29: 5000 [IU] via SUBCUTANEOUS
  Filled 2014-07-28 (×4): qty 1

## 2014-07-28 MED ORDER — ONDANSETRON HCL 4 MG/2ML IJ SOLN: 4.0000 mg | Freq: Once | INTRAMUSCULAR | Status: DC | PRN

## 2014-07-28 MED ORDER — GLYCOPYRROLATE 0.2 MG/ML IJ SOLN
INTRAMUSCULAR | Status: DC | PRN
  Administered 2014-07-28: 0.6 mg via INTRAVENOUS

## 2014-07-28 MED ORDER — LACTATED RINGERS IV SOLN
INTRAVENOUS | Status: DC
  Administered 2014-07-28: 14:00:00 via INTRAVENOUS

## 2014-07-28 MED ORDER — OXYCODONE-ACETAMINOPHEN 5-325 MG PO TABS
1.0000 | ORAL_TABLET | ORAL | Status: DC | PRN
  Administered 2014-07-28 – 2014-07-29 (×3): 2 via ORAL
  Filled 2014-07-28 (×3): qty 2

## 2014-07-28 MED ORDER — GI COCKTAIL ~~LOC~~
30.0000 mL | Freq: Once | ORAL | Status: AC
  Administered 2014-07-28: 30 mL via ORAL

## 2014-07-28 MED ORDER — SODIUM CHLORIDE 0.9 % IV SOLN
1.0000 g | Freq: Once | INTRAVENOUS | Status: AC
  Administered 2014-07-28: 1 g via INTRAVENOUS
  Filled 2014-07-28: qty 1

## 2014-07-28 MED ORDER — ONDANSETRON HCL 4 MG/2ML IJ SOLN
INTRAMUSCULAR | Status: DC | PRN
  Administered 2014-07-28: 4 mg via INTRAVENOUS

## 2014-07-28 MED ORDER — MORPHINE SULFATE 4 MG/ML IJ SOLN
4.0000 mg | Freq: Once | INTRAMUSCULAR | Status: AC
  Administered 2014-07-28: 4 mg via INTRAVENOUS
  Filled 2014-07-28: qty 1

## 2014-07-28 MED ORDER — ONDANSETRON HCL 4 MG/2ML IJ SOLN
4.0000 mg | Freq: Four times a day (QID) | INTRAMUSCULAR | Status: DC | PRN
Start: 2014-07-28 — End: 2014-07-29

## 2014-07-28 MED ORDER — 0.9 % SODIUM CHLORIDE (POUR BTL) OPTIME
TOPICAL | Status: DC | PRN
  Administered 2014-07-28: 1000 mL

## 2014-07-28 MED ORDER — NEOSTIGMINE METHYLSULFATE 10 MG/10ML IV SOLN
INTRAVENOUS | Status: DC | PRN
  Administered 2014-07-28: 4 mg via INTRAVENOUS

## 2014-07-28 MED ORDER — LIDOCAINE HCL (CARDIAC) 20 MG/ML IV SOLN
INTRAVENOUS | Status: DC | PRN
  Administered 2014-07-28: 40 mg via INTRAVENOUS

## 2014-07-28 MED ORDER — MIDAZOLAM HCL 2 MG/2ML IJ SOLN
INTRAMUSCULAR | Status: AC
  Filled 2014-07-28: qty 2

## 2014-07-28 MED ORDER — DEXAMETHASONE SODIUM PHOSPHATE 4 MG/ML IJ SOLN
INTRAMUSCULAR | Status: AC
  Filled 2014-07-28: qty 1

## 2014-07-28 MED ORDER — IOHEXOL 300 MG/ML  SOLN: 25.0000 mL | Freq: Once | INTRAMUSCULAR | Status: AC | PRN

## 2014-07-28 MED ORDER — HYDROMORPHONE HCL 1 MG/ML IJ SOLN
INTRAMUSCULAR | Status: AC
  Filled 2014-07-28: qty 1

## 2014-07-28 MED ORDER — FENTANYL CITRATE 0.05 MG/ML IJ SOLN
INTRAMUSCULAR | Status: AC
  Filled 2014-07-28: qty 5

## 2014-07-28 MED ORDER — BUPIVACAINE-EPINEPHRINE 0.25% -1:200000 IJ SOLN
INTRAMUSCULAR | Status: DC | PRN
  Administered 2014-07-28: 30 mL

## 2014-07-28 MED ORDER — SODIUM CHLORIDE 0.9 % IR SOLN
Status: DC | PRN
  Administered 2014-07-28: 1000 mL

## 2014-07-28 MED ORDER — PROPOFOL 10 MG/ML IV BOLUS
INTRAVENOUS | Status: DC | PRN
  Administered 2014-07-28: 150 mg via INTRAVENOUS

## 2014-07-28 MED ORDER — SUMATRIPTAN SUCCINATE 50 MG PO TABS
50.0000 mg | ORAL_TABLET | ORAL | Status: DC | PRN
  Filled 2014-07-28: qty 1

## 2014-07-28 MED ORDER — IOHEXOL 300 MG/ML  SOLN
80.0000 mL | Freq: Once | INTRAMUSCULAR | Status: AC | PRN
  Administered 2014-07-28: 80 mL via INTRAVENOUS

## 2014-07-28 MED ORDER — MORPHINE SULFATE 4 MG/ML IJ SOLN
4.0000 mg | INTRAMUSCULAR | Status: DC | PRN
  Administered 2014-07-28 – 2014-07-29 (×6): 4 mg via INTRAVENOUS
  Filled 2014-07-28 (×6): qty 1

## 2014-07-28 MED ORDER — PHENYLEPHRINE HCL 10 MG/ML IJ SOLN
INTRAMUSCULAR | Status: DC | PRN
  Administered 2014-07-28: 80 ug via INTRAVENOUS

## 2014-07-28 MED ORDER — KCL IN DEXTROSE-NACL 20-5-0.9 MEQ/L-%-% IV SOLN
INTRAVENOUS | Status: DC
  Filled 2014-07-28 (×3): qty 1000

## 2014-07-28 MED ORDER — ROCURONIUM BROMIDE 100 MG/10ML IV SOLN
INTRAVENOUS | Status: DC | PRN
  Administered 2014-07-28: 35 mg via INTRAVENOUS

## 2014-07-28 MED ORDER — ONDANSETRON HCL 4 MG PO TABS: 4.0000 mg | ORAL_TABLET | Freq: Four times a day (QID) | ORAL | Status: DC | PRN

## 2014-07-28 MED ORDER — SODIUM CHLORIDE 0.9 % IV BOLUS (SEPSIS)
1000.0000 mL | Freq: Once | INTRAVENOUS | Status: AC
  Administered 2014-07-28: 1000 mL via INTRAVENOUS

## 2014-07-28 MED ORDER — SUCCINYLCHOLINE CHLORIDE 20 MG/ML IJ SOLN
INTRAMUSCULAR | Status: DC | PRN
  Administered 2014-07-28: 100 mg via INTRAVENOUS

## 2014-07-28 MED ORDER — DICYCLOMINE HCL 10 MG PO CAPS
10.0000 mg | ORAL_CAPSULE | Freq: Once | ORAL | Status: AC
  Administered 2014-07-28: 10 mg via ORAL
  Filled 2014-07-28: qty 1

## 2014-07-28 MED ORDER — HYDROMORPHONE HCL 1 MG/ML IJ SOLN
0.2500 mg | INTRAMUSCULAR | Status: DC | PRN
  Administered 2014-07-28: 0.75 mg via INTRAVENOUS
  Administered 2014-07-28: 0.25 mg via INTRAVENOUS

## 2014-07-28 MED ORDER — SUMATRIPTAN 20 MG/ACT NA SOLN
20.0000 mg | NASAL | Status: DC | PRN
  Filled 2014-07-28: qty 1

## 2014-07-28 MED ORDER — MIDAZOLAM HCL 5 MG/5ML IJ SOLN
INTRAMUSCULAR | Status: DC | PRN
  Administered 2014-07-28: 2 mg via INTRAVENOUS

## 2014-07-28 MED ORDER — ONDANSETRON HCL 4 MG/2ML IJ SOLN
4.0000 mg | Freq: Once | INTRAMUSCULAR | Status: AC
  Administered 2014-07-28: 4 mg via INTRAVENOUS
  Filled 2014-07-28: qty 2

## 2014-07-28 MED ORDER — KCL IN DEXTROSE-NACL 20-5-0.9 MEQ/L-%-% IV SOLN
INTRAVENOUS | Status: DC
  Administered 2014-07-28: 17:00:00 via INTRAVENOUS
  Filled 2014-07-28 (×4): qty 1000

## 2014-07-28 MED ORDER — DEXAMETHASONE SODIUM PHOSPHATE 4 MG/ML IJ SOLN
INTRAMUSCULAR | Status: DC | PRN
  Administered 2014-07-28: 4 mg via INTRAVENOUS

## 2014-07-28 SURGICAL SUPPLY — 38 items
ADH SKN CLS APL DERMABOND .7 (GAUZE/BANDAGES/DRESSINGS) ×1
APPLIER CLIP ROT 10 11.4 M/L (STAPLE) ×3
APR CLP MED LRG 11.4X10 (STAPLE) ×1
BAG SPEC RTRVL LRG 6X4 10 (ENDOMECHANICALS) ×1
BLADE SURG ROTATE 9660 (MISCELLANEOUS) ×2 IMPLANT
CANISTER SUCTION 2500CC (MISCELLANEOUS) ×3 IMPLANT
CHLORAPREP W/TINT 26ML (MISCELLANEOUS) ×3 IMPLANT
CLIP APPLIE ROT 10 11.4 M/L (STAPLE) IMPLANT
COVER SURGICAL LIGHT HANDLE (MISCELLANEOUS) ×3 IMPLANT
CUTTER FLEX LINEAR 45M (STAPLE) ×3 IMPLANT
DERMABOND ADVANCED (GAUZE/BANDAGES/DRESSINGS) ×2
DERMABOND ADVANCED .7 DNX12 (GAUZE/BANDAGES/DRESSINGS) ×1 IMPLANT
DRAPE UTILITY 15X26 W/TAPE STR (DRAPE) ×6 IMPLANT
ELECT REM PT RETURN 9FT ADLT (ELECTROSURGICAL) ×3
ELECTRODE REM PT RTRN 9FT ADLT (ELECTROSURGICAL) ×1 IMPLANT
ENDOLOOP SUT PDS II  0 18 (SUTURE)
ENDOLOOP SUT PDS II 0 18 (SUTURE) IMPLANT
GLOVE SURG SS PI 7.5 STRL IVOR (GLOVE) ×2 IMPLANT
GOWN STRL REUS W/ TWL LRG LVL3 (GOWN DISPOSABLE) ×3 IMPLANT
GOWN STRL REUS W/TWL LRG LVL3 (GOWN DISPOSABLE) ×9
KIT BASIN OR (CUSTOM PROCEDURE TRAY) ×3 IMPLANT
KIT ROOM TURNOVER OR (KITS) ×3 IMPLANT
NS IRRIG 1000ML POUR BTL (IV SOLUTION) ×3 IMPLANT
PAD ARMBOARD 7.5X6 YLW CONV (MISCELLANEOUS) ×6 IMPLANT
POUCH SPECIMEN RETRIEVAL 10MM (ENDOMECHANICALS) ×3 IMPLANT
RELOAD STAPLE 45 3.5 BLU ETS (ENDOMECHANICALS) ×1 IMPLANT
RELOAD STAPLE TA45 3.5 REG BLU (ENDOMECHANICALS) ×3 IMPLANT
SCALPEL HARMONIC ACE (MISCELLANEOUS) ×3 IMPLANT
SET IRRIG TUBING LAPAROSCOPIC (IRRIGATION / IRRIGATOR) ×3 IMPLANT
SPECIMEN JAR SMALL (MISCELLANEOUS) ×3 IMPLANT
SUT MNCRL AB 4-0 PS2 18 (SUTURE) ×3 IMPLANT
TOWEL OR 17X24 6PK STRL BLUE (TOWEL DISPOSABLE) ×1 IMPLANT
TOWEL OR 17X26 10 PK STRL BLUE (TOWEL DISPOSABLE) ×3 IMPLANT
TRAY FOLEY METER SIL LF 16FR (CATHETERS) ×2 IMPLANT
TRAY LAPAROSCOPIC (CUSTOM PROCEDURE TRAY) ×3 IMPLANT
TROCAR XCEL BLUNT TIP 100MML (ENDOMECHANICALS) ×3 IMPLANT
TROCAR XCEL NON-BLD 5MMX100MML (ENDOMECHANICALS) ×6 IMPLANT
TUBING INSUFFLATION (TUBING) ×3 IMPLANT

## 2014-07-28 NOTE — Op Note (Signed)
07/28/2014  3:09 PM  PATIENT:  [name] [name]  38 y.o. female  PRE-OPERATIVE DIAGNOSIS:  Acute Appendicitis  POST-OPERATIVE DIAGNOSIS:  Acute Appendicitis  PROCEDURE:  Procedure(s): APPENDECTOMY LAPAROSCOPIC (N/A)  SURGEON:  Surgeon(s) and Role:    * Jovita Kussmaul, MD - Primary  PHYSICIAN ASSISTANT:   ASSISTANTS: Judyann Munson, RNFA  ANESTHESIA:   general  EBL:     BLOOD ADMINISTERED:none  DRAINS: none   LOCAL MEDICATIONS USED:  MARCAINE     SPECIMEN:  Source of Specimen:  appendix  DISPOSITION OF SPECIMEN:  PATHOLOGY  COUNTS:  YES  TOURNIQUET:  * No tourniquets in log *  DICTATION: .Dragon Dictation After informed consent was obtained patient was brought to the operating room placed in the supine position on the operating room table. After adequate induction of general anesthesia the patient's abdomen was prepped with ChloraPrep, allowed to dry, and draped in usual sterile manner. The area below the umbilicus was infiltrated with quarter percent Marcaine. A small incision was made with a 15 blade knife. This incision was carried down through the subcutaneous tissue bluntly with a hemostat and [employer] retractors until the linea alba was identified. The linea alba was incised with a 15 blade knife. Each side was grasped Coker clamps and elevated anteriorly. The preperitoneal space was probed bluntly with a hemostat until the peritoneum was opened and access was gained to the abdominal cavity. A 0 Vicryl purse string stitch was placed in the fascia surrounding the opening. A Hassan cannula was placed through the opening and anchored in place with the previously placed Vicryl purse string stitch. The laparoscope was placed through the [facility] cannula. The abdomen was insufflated with carbon dioxide without difficulty. Next the suprapubic area was infiltrated with quarter percent Marcaine. A small incision was made with a 15 blade knife. A 5 mm port was placed bluntly through  this incision into the abdominal cavity. A site was then chosen between the 2 port for placement of a 5 mm port. The area was infiltrated with quarter percent Marcaine. A small stab incision was made with a 15 blade knife. A 5 mm port was placed bluntly through this incision and the abdominal cavity under direct vision. The laparoscope was then moved to the suprapubic port. Using a Glassman grasper and harmonic scalpel the right lower quadrant was inspected. The appendix was readily identified. The appendix was elevated anteriorly and the mesoappendix was taken down sharply with the harmonic scalpel. Once the base of the appendix where it joined the cecum was identified and cleared of any tissue then a laparoscopic GIA blue load 6 row stapler was placed through the [facility] cannula. The stapler was placed across the base of the appendix clamped and fired thereby dividing the base of the appendix between staple lines. A laparoscopic bag was then inserted through the [facility] cannula. The appendix was placed within the bag and the bag was sealed. The abdomen was then irrigated with copious amounts of saline until the effluent was clear. The abdomen was generally inspected in all 4 quadrants and No other abnormalities were noted. The appendix and bag were removed with the [facility] cannula through the infraumbilical port without difficulty. The fascial defect was closed with the previously placed Vicryl pursestring stitch as well as with another interrupted 0 Vicryl figure-of-eight stitch. The rest of the ports were removed under direct vision and were found to be hemostatic. The gas was allowed to escape. The skin incisions were closed with interrupted 4-0  Monocryl subcuticular stitches. Dermabond dressings were applied. The patient tolerated the procedure well. At the end of the case all needle sponge and instrument counts were correct. The patient was then awakened and taken to recovery in stable condition.  PLAN OF  CARE: Admit for overnight observation  PATIENT DISPOSITION:  PACU - hemodynamically stable.   Delay start of Pharmacological VTE agent (>24hrs) due to surgical blood loss or risk of bleeding: no

## 2014-07-28 NOTE — Transfer of Care (Signed)
Immediate Anesthesia Transfer of Care Note  Patient: [name] [name]  Procedure(s) Performed: Procedure(s): APPENDECTOMY LAPAROSCOPIC (N/A)  Patient Location: PACU  Anesthesia Type:General  Level of Consciousness: awake, alert  and oriented  Airway & Oxygen Therapy: Patient Spontanous Breathing and Patient connected to nasal cannula oxygen  Post-op Assessment: Report given to PACU RN, Post -op Vital signs reviewed and stable and Patient moving all extremities X 4  Post vital signs: Reviewed and stable  Complications: No apparent anesthesia complications

## 2014-07-28 NOTE — Anesthesia Postprocedure Evaluation (Signed)
  Anesthesia Post-op Note  Patient: [name] [name]  Procedure(s) Performed: Procedure(s): APPENDECTOMY LAPAROSCOPIC (N/A)  Patient Location: PACU  Anesthesia Type:General  Level of Consciousness: awake, alert  and oriented  Airway and Oxygen Therapy: Patient Spontanous Breathing and Patient connected to nasal cannula oxygen  Post-op Pain: mild  Post-op Assessment: Post-op Vital signs reviewed, Patient's Cardiovascular Status Stable, Respiratory Function Stable, Patent Airway, No signs of Nausea or vomiting and Pain level controlled  Post-op Vital Signs: stable  Last Vitals:  Filed Vitals:   07/28/14 1620  BP: 103/45  Pulse: 75  Temp: 36.7 C  Resp: 16    Complications: No apparent anesthesia complications

## 2014-07-28 NOTE — ED Provider Notes (Signed)
CSN: [id number]     Arrival date & time 07/28/14  3335 History   First MD Initiated Contact with Patient 07/28/14 [phone]     Chief Complaint  Patient presents with  . Abdominal Pain     (Consider location/radiation/quality/duration/timing/severity/associated sxs/prior Treatment) Patient is a 38 y.o. female presenting with abdominal pain. The history is provided by the patient.  Abdominal Pain Pain location:  Periumbilical Pain quality: sharp   Pain radiates to:  Back Pain severity:  Moderate Onset quality:  Sudden Timing:  Constant Progression:  Improving Chronicity:  Recurrent Context: suspicious food intake (has had prior sharp abdominal pains similar to this after receiving a flu shot - thought it was related to egg ingestion. Did  have eggs last night in a baked good)   Context: not eating   Relieved by: GI cocktail. Worsened by:  Nothing tried Associated symptoms: diarrhea and nausea   Associated symptoms: no chills, no cough, no fever, no shortness of breath and no vomiting     Past Medical History  Diagnosis Date  . Depression   . Anxiety 1999/2000  . Panic disorder 1999/2000  . Seasonal allergies   . Migraines   . GERD (gastroesophageal reflux disease)   . IBS (irritable bowel syndrome)   . Adenomatous colon polyp 2008  . Constipation   . Sciatica   . Hematochezia   . Internal hemorrhoids   . 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 Pituitary surgery  1999    resection of benign pituitary tumor, [facility]  . Colonoscopy  01/03/2007    Dr. Silvano Rusk  . Esophagogastroduodenoscopy  09/29/2004    Dr. Kennedy Bucker  . Uterine fibroid surgery     Family History  Problem Relation Age of Onset  . Heart disease Mother   . Diabetes Mother   . Heart disease Father   . Prostate cancer Father   . Heart disease    . Prostate cancer    . Diabetes    . Colon cancer Maternal Grandmother    History   Substance Use Topics  . Smoking status: Never Smoker   . Smokeless tobacco: Never Used  . Alcohol Use: No     Comment: occ.   OB History   Grav Para Term Preterm Abortions TAB SAB Ect Mult Living   6 3 3  3  3   3      Review of Systems  Constitutional: Negative for fever and chills.  Respiratory: Negative for cough and shortness of breath.   Gastrointestinal: Positive for nausea, abdominal pain and diarrhea. Negative for vomiting.  All other systems reviewed and are negative.     Allergies  Latex  Home Medications   Prior to Admission medications   Medication Sig Start Date End Date Taking? Authorizing Provider  CAMILA 0.35 MG tablet Take 1 tablet by mouth daily.  12/31/13  Yes Historical Provider, MD  HYDROmorphone (DILAUDID) 4 MG tablet Take 1-1.5 tablets (4-6 mg total) by mouth daily as needed for severe pain. 07/12/14  Yes Danella Sensing, NP  ibuprofen (ADVIL,MOTRIN) 800 MG tablet Take 800 mg by mouth every 8 (eight) hours as needed for moderate pain.   Yes Historical Provider, MD  Multiple Vitamin (MULTIVITAMIN) capsule Take 1 capsule by mouth daily.   Yes Historical Provider, MD  omega-3 acid ethyl esters (LOVAZA) 1 G capsule Take 1 g by mouth 2 (two) times daily.   Yes  Historical Provider, MD  SUMAtriptan (IMITREX) 20 MG/ACT nasal spray Place 1 spray (20 mg total) into the nose every 2 (two) hours as needed for migraine or headache. May repeat in 2 hours if headache persists or recurs. 12/11/13  Yes Meredith Staggers, MD  SUMAtriptan (IMITREX) 50 MG tablet Take 1 tablet (50 mg total) by mouth every 2 (two) hours as needed for migraine or headache. May repeat in 2 hours if headache persists or recurs. 11/13/13  Yes Meredith Staggers, MD   BP 114/89  Pulse 89  Temp(Src) 97.8 F (36.6 C) (Oral)  Resp 16  Ht 5\' 6"  (1.676 m)  Wt 155 lb (70.308 kg)  BMI 25.03 kg/m2  SpO2 99%  LMP 07/21/2014 Physical Exam  Nursing note and vitals reviewed. Constitutional: She is oriented to  person, place, and time. She appears well-developed and well-nourished. No distress.  HENT:  Head: Normocephalic and atraumatic.  Mouth/Throat: Oropharynx is clear and moist.  Eyes: EOM are normal. Pupils are equal, round, and reactive to light.  Neck: Normal range of motion. Neck supple.  Cardiovascular: Normal rate and regular rhythm.  Exam reveals no friction rub.   No murmur heard. Pulmonary/Chest: Effort normal and breath sounds normal. No respiratory distress. She has no wheezes. She has no rales.  Abdominal: Soft. She exhibits no distension. There is tenderness (mild, diffuse. Central the worst). There is no rebound.  Musculoskeletal: Normal range of motion. She exhibits no edema.  Neurological: She is alert and oriented to person, place, and time. No cranial nerve deficit. She exhibits normal muscle tone. Coordination normal.  Skin: No rash noted. She is not diaphoretic.    ED Course  Procedures (including critical care time) Labs Review Labs Reviewed  CBC WITH DIFFERENTIAL  COMPREHENSIVE METABOLIC PANEL  LIPASE, BLOOD  PREGNANCY, URINE  URINALYSIS, ROUTINE W REFLEX MICROSCOPIC    Imaging Review Ct Abdomen Pelvis W Contrast  07/28/2014   CLINICAL DATA:  Initial encounter for worsening Mid abdominal pain with nausea that began last night. Associated mild diarrhea.  EXAM: CT ABDOMEN AND PELVIS WITH CONTRAST  TECHNIQUE: Multidetector CT imaging of the abdomen and pelvis was performed using the standard protocol following bolus administration of intravenous contrast.  CONTRAST:  40mL OMNIPAQUE IOHEXOL 300 MG/ML  SOLN  COMPARISON:  Abdominal MRI from 12/16/2009. CT scan from 12/29/2006.  FINDINGS: Lower chest: 4 mm right lower lobe pulmonary nodule seen on image 2 is unchanged since 2008, consistent with a benign process such as scarring or subpleural lymph node. 5 mm left lower lobe nodule seen on image 7 is also unchanged, consistent with a benign process.  Hepatobiliary: 11 mm  low-density lesion in the lateral segment of the left liver measured 10 mm on the MRI from more than 4 years ago. 10 mm lesion in the inferior aspect of the medial segment left liver (image 33) was 9 mm previously. Liver is otherwise unremarkable. There is no evidence for gallstones, gallbladder wall thickening, or pericholecystic fluid. No intrahepatic or extrahepatic biliary dilation.  Pancreas: No focal mass lesion. No dilatation of the main duct. No intraparenchymal cyst. No peripancreatic edema.  Spleen: No splenomegaly. No focal mass lesion.  Adrenals/Urinary Tract: No adrenal nodule or mass. No renal mass. No hydronephrosis. No evidence for ureteral abnormality. Bladder is normal.  Stomach/Bowel: Stomach is nondistended. No gastric wall thickening. No evidence of outlet obstruction. Duodenum is normally positioned as is the ligament of Treitz. No small bowel wall thickening. No small bowel dilatation. Terminal  ileum is normal. A small appendicolith is seen in the base of the appendix. Appendiceal diameter is increased up to about 13 mm. Limited the appendix is diffusely fluid filled. There is apparent slight hyper remain EM in the wall of the appendix. No extraluminal gas or adjacent abscess. No gross colonic mass. No colonic wall thickening. No substantial diverticular change.  Vascular/Lymphatic: No abdominal aortic aneurysm. No gastrohepatic or hepatoduodenal ligament lymphadenopathy. No retroperitoneal or pelvic sidewall lymphadenopathy.  Reproductive: Uterus is normal.  No adnexal mass.  Other: No intraperitoneal free fluid.  No ventral hernia.  Musculoskeletal: Bone windows reveal no worrisome lytic or sclerotic osseous lesions.  IMPRESSION: Small stone identified in the base the appendix with CT imaging features consistent with acute appendicitis. No evidence for perforation or abscess at this time.  I personally called the results of this study to Dr. Mingo Amber at 1208 hours on 07/28/2014.    Electronically Signed   By: Misty Stanley M.D.   On: 07/28/2014 12:09     EKG Interpretation None      MDM   Final diagnoses:  Acute appendicitis, unspecified acute appendicitis type    38 year old female here or sharp abdominal pain radiating to her back. Had similar pains before chronically, thought to be related to IBS or egg congestion. The pain started after receiving a flu shot, hence the egg-relation theory. Mild nausea and diarrhea, no vomiting. No fevers. AFVSS here. Central abdominal pain without rebound or guarding. Diffuse pain noted, but not as severe as central. Feels like GI cocktail is improving. Discussed CT scanning, patient would rather wait on labs and meds. Labs show worsening leukocytosis, CT pursued and shows appendicits. Admitted to surgery. I have reviewed all labs and imaging and considered them in my medical decision making.    Evelina Bucy, MD 07/29/14 [phone]

## 2014-07-28 NOTE — ED Notes (Signed)
Called to inform CT that pt has finished her contrast.

## 2014-07-28 NOTE — ED Notes (Signed)
Surgery at bedside.

## 2014-07-28 NOTE — Anesthesia Procedure Notes (Signed)
Procedure Name: Intubation Date/Time: 07/28/2014 2:27 PM Performed by: Neldon Newport Pre-anesthesia Checklist: Patient identified, Timeout performed, Emergency Drugs available, Suction available and Patient being monitored Patient Re-evaluated:Patient Re-evaluated prior to inductionOxygen Delivery Method: Circle system utilized Preoxygenation: Pre-oxygenation with 100% oxygen Intubation Type: IV induction and Rapid sequence Laryngoscope Size: Mac and 3 Grade View: Grade I Tube type: Oral Tube size: 7.0 mm Number of attempts: 1 Placement Confirmation: positive ETCO2,  ETT inserted through vocal cords under direct vision and breath sounds checked- equal and bilateral Secured at: 22 cm Tube secured with: Tape Dental Injury: Teeth and Oropharynx as per pre-operative assessment

## 2014-07-28 NOTE — ED Notes (Signed)
Pt. reports progressing mid abdominal pain with nausea onset last night , mild diarrhea , denies fever or chills.

## 2014-07-28 NOTE — ED Notes (Signed)
Pt requesting update on CT status, called CT again and they said she is next in line. Pt has been updated.

## 2014-07-28 NOTE — H&P (Signed)
[name] [name] is an 38 y.o. female.   Chief Complaint: abdominal pain HPI: The pt is a 38 yo wf who presents with abdominal pain that started yesterday evening acutely. She does have a history of irritable bowel and intermittently has similar abdominal pain but not this severe. She has had nausea but no vomiting. CT shows appendicitis but no sign of rupture  Past Medical History  Diagnosis Date  . Depression   . Anxiety 1999/2000  . Panic disorder 1999/2000  . Seasonal allergies   . Migraines   . GERD (gastroesophageal reflux disease)   . IBS (irritable bowel syndrome)   . Adenomatous colon polyp 2008  . Constipation   . Sciatica   . Hematochezia   . Internal hemorrhoids   . 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 Pituitary surgery  1999    resection of benign pituitary tumor, [facility]  . Colonoscopy  01/03/2007    Dr. Silvano Rusk  . Esophagogastroduodenoscopy  09/29/2004    Dr. Kennedy Bucker  . Uterine fibroid surgery      Family History  Problem Relation Age of Onset  . Heart disease Mother   . Diabetes Mother   . Heart disease Father   . Prostate cancer Father   . Heart disease    . Prostate cancer    . Diabetes    . Colon cancer Maternal Grandmother    Social History:  reports that she has never smoked. She has never used smokeless tobacco. She reports that she does not drink alcohol or use illicit drugs.  Allergies:  Allergies  Allergen Reactions  . Latex Rash     (Not in a hospital admission)  Results for orders placed during the hospital encounter of 07/28/14 (from the past 48 hour(s))  CBC WITH DIFFERENTIAL     Status: Abnormal   Collection Time    07/28/14  7:46 AM      Result Value Ref Range   WBC 17.1 (*) 4.0 - 10.5 K/uL   RBC 4.70  3.87 - 5.11 MIL/uL   Hemoglobin 13.4  12.0 - 15.0 g/dL   HCT 40.6  36.0 - 46.0 %   MCV 86.4  78.0 - 100.0 fL   MCH 28.5  26.0 - 34.0 pg   MCHC 33.0   30.0 - 36.0 g/dL   RDW 13.5  11.5 - 15.5 %   Platelets 279  150 - 400 K/uL   Neutrophils Relative % 87 (*) 43 - 77 %   Neutro Abs 14.9 (*) 1.7 - 7.7 K/uL   Lymphocytes Relative 6 (*) 12 - 46 %   Lymphs Abs 1.0  0.7 - 4.0 K/uL   Monocytes Relative 6  3 - 12 %   Monocytes Absolute 1.1 (*) 0.1 - 1.0 K/uL   Eosinophils Relative 1  0 - 5 %   Eosinophils Absolute 0.1  0.0 - 0.7 K/uL   Basophils Relative 0  0 - 1 %   Basophils Absolute 0.0  0.0 - 0.1 K/uL  COMPREHENSIVE METABOLIC PANEL     Status: Abnormal   Collection Time    07/28/14  7:46 AM      Result Value Ref Range   Sodium 138  137 - 147 mEq/L   Potassium 3.9  3.7 - 5.3 mEq/L   Chloride 102  96 - 112 mEq/L   CO2 26  19 - 32 mEq/L  Glucose, Bld 110 (*) 70 - 99 mg/dL   BUN 8  6 - 23 mg/dL   Creatinine, Ser 0.60  0.50 - 1.10 mg/dL   Calcium 10.0  8.4 - 10.5 mg/dL   Total Protein 8.0  6.0 - 8.3 g/dL   Albumin 4.7  3.5 - 5.2 g/dL   AST 18  0 - 37 U/L   ALT 12  0 - 35 U/L   Alkaline Phosphatase 49  39 - 117 U/L   Total Bilirubin 0.8  0.3 - 1.2 mg/dL   GFR calc non Af Amer >90  >90 mL/min   GFR calc Af Amer >90  >90 mL/min   Comment: (NOTE)     The eGFR has been calculated using the CKD EPI equation.     This calculation has not been validated in all clinical situations.     eGFR's persistently <90 mL/min signify possible Chronic Kidney     Disease.   Anion gap 10  5 - 15  LIPASE, BLOOD     Status: None   Collection Time    07/28/14  7:46 AM      Result Value Ref Range   Lipase 16  11 - 59 U/L  PREGNANCY, URINE     Status: None   Collection Time    07/28/14  7:46 AM      Result Value Ref Range   Preg Test, Ur NEGATIVE  NEGATIVE   Comment:            THE SENSITIVITY OF THIS     METHODOLOGY IS >20 mIU/mL.  URINALYSIS, ROUTINE W REFLEX MICROSCOPIC     Status: None   Collection Time    07/28/14  7:46 AM      Result Value Ref Range   Color, Urine YELLOW  YELLOW   APPearance CLEAR  CLEAR   Specific Gravity, Urine 1.013   1.005 - 1.030   pH 6.5  5.0 - 8.0   Glucose, UA NEGATIVE  NEGATIVE mg/dL   Hgb urine dipstick NEGATIVE  NEGATIVE   Bilirubin Urine NEGATIVE  NEGATIVE   Ketones, ur NEGATIVE  NEGATIVE mg/dL   Protein, ur NEGATIVE  NEGATIVE mg/dL   Urobilinogen, UA 0.2  0.0 - 1.0 mg/dL   Nitrite NEGATIVE  NEGATIVE   Leukocytes, UA NEGATIVE  NEGATIVE   Comment: MICROSCOPIC NOT DONE ON URINES WITH NEGATIVE PROTEIN, BLOOD, LEUKOCYTES, NITRITE, OR GLUCOSE <1000 mg/dL.   Ct Abdomen Pelvis W Contrast  07/28/2014   CLINICAL DATA:  Initial encounter for worsening Mid abdominal pain with nausea that began last night. Associated mild diarrhea.  EXAM: CT ABDOMEN AND PELVIS WITH CONTRAST  TECHNIQUE: Multidetector CT imaging of the abdomen and pelvis was performed using the standard protocol following bolus administration of intravenous contrast.  CONTRAST:  24m OMNIPAQUE IOHEXOL 300 MG/ML  SOLN  COMPARISON:  Abdominal MRI from 12/16/2009. CT scan from 12/29/2006.  FINDINGS: Lower chest: 4 mm right lower lobe pulmonary nodule seen on image 2 is unchanged since 2008, consistent with a benign process such as scarring or subpleural lymph node. 5 mm left lower lobe nodule seen on image 7 is also unchanged, consistent with a benign process.  Hepatobiliary: 11 mm low-density lesion in the lateral segment of the left liver measured 10 mm on the MRI from more than 4 years ago. 10 mm lesion in the inferior aspect of the medial segment left liver (image 33) was 9 mm previously. Liver is otherwise unremarkable. There is no evidence  for gallstones, gallbladder wall thickening, or pericholecystic fluid. No intrahepatic or extrahepatic biliary dilation.  Pancreas: No focal mass lesion. No dilatation of the main duct. No intraparenchymal cyst. No peripancreatic edema.  Spleen: No splenomegaly. No focal mass lesion.  Adrenals/Urinary Tract: No adrenal nodule or mass. No renal mass. No hydronephrosis. No evidence for ureteral abnormality. Bladder is  normal.  Stomach/Bowel: Stomach is nondistended. No gastric wall thickening. No evidence of outlet obstruction. Duodenum is normally positioned as is the ligament of Treitz. No small bowel wall thickening. No small bowel dilatation. Terminal ileum is normal. A small appendicolith is seen in the base of the appendix. Appendiceal diameter is increased up to about 13 mm. Limited the appendix is diffusely fluid filled. There is apparent slight hyper remain EM in the wall of the appendix. No extraluminal gas or adjacent abscess. No gross colonic mass. No colonic wall thickening. No substantial diverticular change.  Vascular/Lymphatic: No abdominal aortic aneurysm. No gastrohepatic or hepatoduodenal ligament lymphadenopathy. No retroperitoneal or pelvic sidewall lymphadenopathy.  Reproductive: Uterus is normal.  No adnexal mass.  Other: No intraperitoneal free fluid.  No ventral hernia.  Musculoskeletal: Bone windows reveal no worrisome lytic or sclerotic osseous lesions.  IMPRESSION: Small stone identified in the base the appendix with CT imaging features consistent with acute appendicitis. No evidence for perforation or abscess at this time.  I personally called the results of this study to Dr. Mingo Amber at 1208 hours on 07/28/2014.   Electronically Signed   By: Misty Stanley M.D.   On: 07/28/2014 12:09    Review of Systems  Constitutional: Negative.   HENT: Negative.   Eyes: Negative.   Respiratory: Negative.   Cardiovascular: Negative.   Gastrointestinal: Positive for nausea and abdominal pain. Negative for vomiting.  Genitourinary: Negative.   Musculoskeletal: Negative.   Skin: Negative.   Neurological: Negative.   Endo/Heme/Allergies: Negative.   Psychiatric/Behavioral: Negative.     Blood pressure 109/64, pulse 78, temperature 97.8 F (36.6 C), temperature source Oral, resp. rate 18, height '5\' 6"'  (1.676 m), weight 155 lb (70.308 kg), last menstrual period 07/21/2014, SpO2 100.00%, unknown if  currently breastfeeding. Physical Exam  Constitutional: She is oriented to person, place, and time. She appears well-developed and well-nourished.  HENT:  Head: Normocephalic and atraumatic.  Eyes: Conjunctivae and EOM are normal. Pupils are equal, round, and reactive to light.  Neck: Normal range of motion. Neck supple.  Cardiovascular: Normal rate, regular rhythm and normal heart sounds.   Respiratory: Effort normal and breath sounds normal.  GI: Soft.  There is moderate tenderness in the RLQ. No peritonitis  Musculoskeletal: Normal range of motion.  Neurological: She is alert and oriented to person, place, and time.  Skin: Skin is warm and dry.  Psychiatric: She has a normal mood and affect. Her behavior is normal.     Assessment/Plan The pt appears to have acute appendicitis. Because of the risk of perforation and sepsis I think she would benefit from having her appendix removed. I have discussed with her the risks and benefits of surgery as well as some of the technical aspects including the risk of leak and she understands and wishes to proceed  TOTH III,Jamine Highfill S 07/28/2014, 1:13 PM

## 2014-07-28 NOTE — Progress Notes (Signed)
Assisted oob to br by self and dana rn to void

## 2014-07-28 NOTE — Anesthesia Preprocedure Evaluation (Addendum)
Anesthesia Evaluation  Patient identified by MRN, date of birth, ID band Patient awake    Reviewed: Allergy & Precautions, H&P , NPO status , Patient's Chart, lab work & pertinent test results  Airway Mallampati: II TM Distance: >3 FB Neck ROM: full    Dental  (+) Teeth Intact, Dental Advidsory Given   Pulmonary  breath sounds clear to auscultation        Cardiovascular Rhythm:Regular Rate:Normal     Neuro/Psych  Headaches, PSYCHIATRIC DISORDERS  Neuromuscular disease    GI/Hepatic GERD-  Medicated and Poorly Controlled,  Endo/Other    Renal/GU      Musculoskeletal   Abdominal   Peds  Hematology   Anesthesia Other Findings   Reproductive/Obstetrics                          Anesthesia Physical Anesthesia Plan  ASA: II  Anesthesia Plan: General   Post-op Pain Management:    Induction: Intravenous, Cricoid pressure planned and Rapid sequence  Airway Management Planned: Oral ETT  Additional Equipment:   Intra-op Plan:   Post-operative Plan: Extubation in OR  Informed Consent:   Dental Advisory Given  Plan Discussed with: Anesthesiologist, CRNA and Surgeon  Anesthesia Plan Comments: (Appendicitis Anxiety GERD Migraines  Plan GA with RSI  Roberts Gaudy, MD)       Anesthesia Quick Evaluation

## 2014-07-29 ENCOUNTER — Encounter (HOSPITAL_COMMUNITY): Payer: Self-pay | Admitting: General Surgery

## 2014-07-29 DIAGNOSIS — K353 Acute appendicitis with localized peritonitis: Secondary | ICD-10-CM | POA: Diagnosis not present

## 2014-07-29 MED ORDER — IBUPROFEN 600 MG PO TABS: 600.0000 mg | ORAL_TABLET | Freq: Three times a day (TID) | ORAL | Status: DC | PRN

## 2014-07-29 MED ORDER — WHITE PETROLATUM GEL
Status: AC
  Filled 2014-07-29: qty 5

## 2014-07-29 MED ORDER — OXYCODONE HCL 5 MG PO TABS: 5.0000 mg | ORAL_TABLET | ORAL | Status: DC | PRN

## 2014-07-29 MED ORDER — OXYCODONE HCL 5 MG PO TABS
5.0000 mg | ORAL_TABLET | ORAL | Status: DC | PRN
  Administered 2014-07-29: 10 mg via ORAL
  Filled 2014-07-29: qty 2

## 2014-07-29 MED ORDER — DOCUSATE SODIUM 100 MG PO CAPS
100.0000 mg | ORAL_CAPSULE | Freq: Every day | ORAL | Status: DC
  Administered 2014-07-29: 100 mg via ORAL
  Filled 2014-07-29: qty 1

## 2014-07-29 NOTE — Discharge Summary (Signed)
Patient ID: ASTRIA JORDAHL MRN: [id number] DOB/AGE: 38-Apr-1977 38 y.o.  Admit date: 07/28/2014 Discharge date: 07/29/2014  Procedures: lap appy  Consults: None  Reason for Admission: The pt is a 38 yo wf who presents with abdominal pain that started yesterday evening acutely. She does have a history of irritable bowel and intermittently has similar abdominal pain but not this severe. She has had nausea but no vomiting. CT shows appendicitis but no sign of rupture  Admission Diagnoses:  1. Acute appendicitis Patient Active Problem List   Diagnosis Date Noted  . Acute appendicitis 07/28/2014  . Other malaise and fatigue 03/09/2014  . Unspecified vitamin D deficiency 03/09/2014  . Postpartum care following vaginal delivery (12/18) 10/08/2013  . Normal labor and delivery 10/08/2013  . Migraine with aura, intractable 01/20/2013  . Headache, chronic migraine without aura, intractable 03/18/2012  . Myalgia and myositis, unspecified 03/18/2012  . Lumbar spondylosis 03/18/2012  . PERSONAL HISTORY OF ALLERGY TO EGGS- GI SENSITIVITY 05/04/2010  . CHANGE IN BOWELS 05/02/2010  . IRRITABLE BOWEL SYNDROME 12/04/2009  . NONSPECIFIC ABN FINDNG RAD&OTH EXAM BILARY TRCT 11/30/2009  . GERD 01/31/2009  . ABDOMINAL PAIN-EPIGASTRIC 01/31/2009  . PERSONAL HX COLONIC POLYPS 12/14/2008  . INTERNAL HEMORRHOIDS 01/03/2007  . HIATAL HERNIA 09/29/2004    Hospital Course: The patient was admitted and taken to the OR where she underwent a lap appy.  She tolerated this well.  She was tolerating a solid diet and oral pain medications on POD1.  She was stable for dc home.  PE: Abd: soft, appropriately tender, incisions c/d/i with dermabond, +BS  Discharge Diagnoses:  Active Problems:   Acute appendicitis s/p lap appy  Discharge Medications:   Medication List         CAMILA 0.35 MG tablet  Generic drug:  norethindrone  Take 1 tablet by mouth daily.     HYDROmorphone 4 MG tablet  Commonly known as:   DILAUDID  Take 1-1.5 tablets (4-6 mg total) by mouth daily as needed for severe pain.     ibuprofen 800 MG tablet  Commonly known as:  ADVIL,MOTRIN  Take 800 mg by mouth every 8 (eight) hours as needed for moderate pain.     multivitamin capsule  Take 1 capsule by mouth daily.     omega-3 acid ethyl esters 1 G capsule  Commonly known as:  LOVAZA  Take 1 g by mouth 2 (two) times daily.     oxyCODONE 5 MG immediate release tablet  Commonly known as:  Oxy IR/ROXICODONE  Take 1-2 tablets (5-10 mg total) by mouth every 4 (four) hours as needed for moderate pain.     SUMAtriptan 20 MG/ACT nasal spray  Commonly known as:  IMITREX  Place 1 spray (20 mg total) into the nose every 2 (two) hours as needed for migraine or headache. May repeat in 2 hours if headache persists or recurs.     SUMAtriptan 50 MG tablet  Commonly known as:  IMITREX  Take 1 tablet (50 mg total) by mouth every 2 (two) hours as needed for migraine or headache. May repeat in 2 hours if headache persists or recurs.        Discharge Instructions:     Follow-up Information   Follow up with [facility] On 08/17/2014. (3:00pm, arrive by 2:30pm for paperwork)    Contact information:   [address] [place] [address] [phone]       Signed: Saverio Danker  E 07/29/2014, 9:10 AM

## 2014-07-29 NOTE — Discharge Instructions (Signed)
[facility] ______[facility] °LAPAROSCOPIC SURGERY: POST OP INSTRUCTIONS °Always review your discharge instruction sheet given to you by the facility where your surgery was performed. °IF YOU HAVE DISABILITY OR FAMILY LEAVE FORMS, YOU MUST BRING THEM TO THE OFFICE FOR PROCESSING.   °DO NOT GIVE THEM TO YOUR DOCTOR. ° °1. A prescription for pain medication may be given to you upon discharge.  Take your pain medication as prescribed, if needed.  If narcotic pain medicine is not needed, then you may take acetaminophen (Tylenol) or ibuprofen (Advil) as needed. °2. Take your usually prescribed medications unless otherwise directed. °3. If you need a refill on your pain medication, please contact your pharmacy.  They will contact our office to request authorization. Prescriptions will not be filled after 5pm or on week-ends. °4. You should follow a light diet the first few days after arrival home, such as soup and crackers, etc.  Be sure to include lots of fluids daily. °5. Most patients will experience some swelling and bruising in the area of the incisions.  Ice packs will help.  Swelling and bruising can take several days to resolve.  °6. It is common to experience some constipation if taking pain medication after surgery.  Increasing fluid intake and taking a stool softener (such as Colace) will usually help or prevent this problem from occurring.  A mild laxative (Milk of Magnesia or Miralax) should be taken according to package instructions if there are no bowel movements after 48 hours. °7. Unless discharge instructions indicate otherwise, you may remove your bandages 24-48 hours after surgery, and you may shower at that time.  You may have steri-strips (small skin tapes) in place directly over the incision.  These strips should be left on the skin for 7-10 days.  If your surgeon used skin glue on the incision, you may shower in 24 hours.  The glue will flake off over the next 2-3 weeks.  Any sutures or  staples will be removed at the office during your follow-up visit. °8. ACTIVITIES:  You may resume regular (light) daily activities beginning the next day--such as daily self-care, walking, climbing stairs--gradually increasing activities as tolerated.  You may have sexual intercourse when it is comfortable.  Refrain from any heavy lifting or straining until approved by your doctor. °a. You may drive when you are no longer taking prescription pain medication, you can comfortably wear a seatbelt, and you can safely maneuver your car and apply brakes. °b. RETURN TO WORK:  __________________________________________________________ °9. You should see your doctor in the office for a follow-up appointment approximately 2-3 weeks after your surgery.  Make sure that you call for this appointment within a day or two after you arrive home to insure a convenient appointment time. °10. OTHER INSTRUCTIONS: __________________________________________________________________________________________________________________________ __________________________________________________________________________________________________________________________ °WHEN TO CALL YOUR DOCTOR: °1. Fever over 101.0 °2. Inability to urinate °3. Continued bleeding from incision. °4. Increased pain, redness, or drainage from the incision. °5. Increasing abdominal pain ° °The clinic staff is available to answer your questions during regular business hours.  Please don’t hesitate to call and ask to speak to one of the nurses for clinical concerns.  If you have a medical emergency, go to the nearest emergency room or call 911.  A surgeon from [facility] is always on call at the hospital. °[address], [address], , [place]  [address] ? [address] [address], , [place]   [address] °[phone] ? [phone] ? FAX [phone] °Web site:   [email or web address] °

## 2014-07-30 ENCOUNTER — Ambulatory Visit: Payer: Medicare Other | Admitting: Physical Medicine & Rehabilitation

## 2014-08-05 NOTE — Addendum Note (Signed)
Addendum created 08/05/14 3005 by Roberts Gaudy, MD   Modules edited: Anesthesia Responsible Staff

## 2014-08-23 ENCOUNTER — Telehealth: Payer: Self-pay | Admitting: Physical Medicine & Rehabilitation

## 2014-08-23 ENCOUNTER — Encounter (HOSPITAL_COMMUNITY): Payer: Self-pay | Admitting: General Surgery

## 2014-08-23 NOTE — Telephone Encounter (Signed)
Patient called to check on her next appointment. I gave her the information- January 8th at 9:20 arrival. She wanted to make sure her November appointment was cancelled. I told her I did not see a November appointment in the computer. She said it was moved to January. I then gave her the January date and time again to just reinforce her date and arrival time. She asked what she should do when she needs a refill? She clarified that the script had to be hand carried. I told her we only give those prescriptions out at appointments and if Dr. [name] was not available that we had an NP and an RN she could see between now and January 8th.

## 2014-08-26 ENCOUNTER — Telehealth: Payer: Self-pay | Admitting: *Deleted

## 2014-08-26 NOTE — Telephone Encounter (Signed)
Called for a refill on her hydromorphone.  Last fill 08/08/14.  It is too early to fill before 09/06/14.  She will need a NP or RN med refill appt per last note by Johnette Johnette.  She does not return to see Dr [name] until January 8. Please schedule for a med refill visit around 11/16 or before.

## 2014-08-30 ENCOUNTER — Encounter: Payer: Medicare Other | Attending: Registered Nurse | Admitting: Registered Nurse

## 2014-08-30 ENCOUNTER — Encounter: Payer: Self-pay | Admitting: Registered Nurse

## 2014-08-30 ENCOUNTER — Other Ambulatory Visit: Payer: Self-pay | Admitting: Physical Medicine & Rehabilitation

## 2014-08-30 VITALS — BP 128/79 | HR 90 | Resp 14 | Ht 66.0 in | Wt 158.0 lb

## 2014-08-30 DIAGNOSIS — M791 Myalgia: Secondary | ICD-10-CM | POA: Diagnosis present

## 2014-08-30 DIAGNOSIS — M609 Myositis, unspecified: Secondary | ICD-10-CM | POA: Diagnosis present

## 2014-08-30 DIAGNOSIS — Z79899 Other long term (current) drug therapy: Secondary | ICD-10-CM

## 2014-08-30 DIAGNOSIS — G894 Chronic pain syndrome: Secondary | ICD-10-CM | POA: Insufficient documentation

## 2014-08-30 DIAGNOSIS — Z5181 Encounter for therapeutic drug level monitoring: Secondary | ICD-10-CM | POA: Diagnosis present

## 2014-08-30 DIAGNOSIS — G43111 Migraine with aura, intractable, with status migrainosus: Secondary | ICD-10-CM | POA: Diagnosis present

## 2014-08-30 DIAGNOSIS — IMO0001 Reserved for inherently not codable concepts without codable children: Secondary | ICD-10-CM

## 2014-08-30 MED ORDER — HYDROMORPHONE HCL 4 MG PO TABS: 4.0000 mg | ORAL_TABLET | Freq: Every day | ORAL | Status: DC | PRN

## 2014-08-30 NOTE — Progress Notes (Signed)
Subjective:    Patient ID: [name] [name], female    DOB: [birth year], 38 y.o.   MRN: [id number]  HPI: Mrs. [name] [name] is a 38 year old female who returns for follow up for chronic pain and medication refill. She says her pain is located in her neck,mid-back,bilateral hips and right leg. Also complaining of headache. She says her pain has intensified and was wondering if she could have an increase in her pain medications. Spoke with Dr. [name] we will increase her Dilaudid to 35 tablets. She rates her pain 7. Her current exercise regime is yoga, and walking.  She presented to [facility] ED on 10.05/2014 with abdominal pain, CT showed appendicitis no sign of rupture. She had a Lap appy  Two scripts were discarded due to error entry.   Pain Inventory Average Pain 5 Pain Right Now 7 My pain is constant, dull and stabbing  In the last 24 hours, has pain interfered with the following? General activity 6 Relation with others 4 Enjoyment of life 3 What TIME of day is your pain at its worst? daytime, evening Sleep (in general) Fair  Pain is worse with: sitting, inactivity and standing Pain improves with: rest, heat/ice, therapy/exercise, medication, TENS and injections Relief from Meds: 8  Mobility Do you have any goals in this area?  no  Function disabled: date disabled unsure Do you have any goals in this area?  no  Neuro/Psych No problems in this area  Prior Studies Any changes since last visit?  no  Physicians involved in your care Any changes since last visit?  no   Family History  Problem Relation Age of Onset  . Heart disease Mother   . Diabetes Mother   . Heart disease Father   . Prostate cancer Father   . Heart disease    . Prostate cancer    . Diabetes    . Colon cancer Maternal Grandmother    History   Social History  . Marital Status: Married    Spouse Name: N/A    Number of Children: N/A  . Years of Education: N/A   Social History Main Topics    . Smoking status: Never Smoker   . Smokeless tobacco: Never Used  . Alcohol Use: 0.6 oz/week    1 Glasses of wine per week  . Drug Use: No  . Sexual Activity: Yes   Other Topics Concern  . None   Social History Narrative   Past Surgical History  Procedure Laterality Date  . Transphenoidal / transnasal hypophysectomy / resection pituitary tumor  1999    resection of benign pituitary tumor, [facility]  . Colonoscopy  01/03/2007    Dr. Silvano Rusk  . Esophagogastroduodenoscopy  09/29/2004    Dr. Kennedy Bucker  . Uterine fibroid surgery  2008  . Appendectomy  07/28/2014  . Laparoscopic appendectomy N/A 07/28/2014    Procedure: APPENDECTOMY LAPAROSCOPIC;  Surgeon: Autumn Messing III, MD;  Location: [facility];  Service: General;  Laterality: N/A;   Past Medical History  Diagnosis Date  . Seasonal allergies   . GERD (gastroesophageal reflux disease)   . IBS (irritable bowel syndrome)   . Adenomatous colon polyp 2008  . Constipation   . Sciatica   . Hematochezia   . Internal hemorrhoids   . 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 Complication of anesthesia     "didn't wake up well/remembered stuff from during OR when I had pituitary surgery"  .  DVT (deep vein thrombosis) in pregnancy 09/2013    "LLE; postpartum"  . Sleep apnea     "don't currently use CPAP" (07/28/2014)  . Prolactin secreting pituitary adenoma 1999  . H/O hiatal hernia   . Migraines     "couple times/wk" (07/28/2014)  . Arthritis     "jaw joints; neck" (07/28/2014)  . Fibromyalgia   . Depression     hx  . Anxiety 1999/2000  . Panic disorder 1999/2000   BP 128/79 mmHg  Pulse 90  Resp 14  Ht 5\' 6"  (1.676 m)  Wt 158 lb (71.668 kg)  BMI 25.51 kg/m2  SpO2 99%  Opioid Risk Score:   Fall Risk Score: Low Fall Risk (0-5 points) Review of Systems  All other systems reviewed and are negative.      Objective:   Physical Exam  Constitutional: She is oriented to person, place, and time. She appears  well-developed and well-nourished.  HENT:  Head: Normocephalic and atraumatic.  Neck: Normal range of motion. Neck supple.  Cervical Paraspinal tenderness: C-3- C-4  Cardiovascular: Normal rate and regular rhythm.   Pulmonary/Chest: Effort normal and breath sounds normal.  Musculoskeletal:  Normal Muscle Bulk and Muscle testing reveals: Upper Extremities: Full ROM and Muscle Strength 5/5 Thoracic and Lumbar Hypersensitivity Lower Extremities: Full ROM and Muscle strength 5/5 Arises from chair with ease Narrow Based Gait  Neurological: She is oriented to person, place, and time.  Skin: Skin is warm and dry.  Psychiatric: She has a normal mood and affect.  Nursing note and vitals reviewed.         Assessment & Plan:  1. Migraine headaches. Refilled Dilaudid 4-6mg  qd prn.Increased to 35 tablets. Second script given.  2. Fibromyalgia syndrome. Continue with exercise regime 3. Depression: Continue to Monitor 4. Chronic low back pain and cervicalgia. The patient with myofascial and spondylosis components. Continue with exercise and heat therapy.  20 minutes of face to face patient care time was spent during this visit. All questions were encouraged and answered.  F/U in 3 months

## 2014-09-01 ENCOUNTER — Ambulatory Visit: Payer: Medicare Other | Admitting: Registered Nurse

## 2014-09-01 LAB — PMP ALCOHOL METABOLITE (ETG): Ethyl Glucuronide (EtG): NEGATIVE ng/mL

## 2014-09-02 LAB — OPIATES/OPIOIDS (LC/MS-MS)
Codeine Urine: NEGATIVE ng/mL (ref <50)
HYDROMORPHONE: NEGATIVE ng/mL — AB (ref <50)
Hydrocodone: NEGATIVE ng/mL (ref <50)
MORPHINE: 130 ng/mL — AB (ref <50)
Norhydrocodone, Ur: NEGATIVE ng/mL (ref <50)
Noroxycodone, Ur: NEGATIVE ng/mL (ref <50)
OXYCODONE, UR: NEGATIVE ng/mL (ref <50)
OXYMORPHONE, URINE: NEGATIVE ng/mL (ref <50)

## 2014-09-03 LAB — PRESCRIPTION MONITORING PROFILE (SOLSTAS)
Amphetamine/Meth: NEGATIVE ng/mL
BARBITURATE SCREEN, URINE: NEGATIVE ng/mL
BENZODIAZEPINE SCREEN, URINE: NEGATIVE ng/mL
Buprenorphine, Urine: NEGATIVE ng/mL
CANNABINOID SCRN UR: NEGATIVE ng/mL
CARISOPRODOL, URINE: NEGATIVE ng/mL
COCAINE METABOLITES: NEGATIVE ng/mL
Creatinine, Urine: 107.39 mg/dL (ref >20.0)
Fentanyl, Ur: NEGATIVE ng/mL
MDMA URINE: NEGATIVE ng/mL
MEPERIDINE UR: NEGATIVE ng/mL
Methadone Screen, Urine: NEGATIVE ng/mL
Nitrites, Initial: NEGATIVE ug/mL
Oxycodone Screen, Ur: NEGATIVE ng/mL
Propoxyphene: NEGATIVE ng/mL
Tapentadol, urine: NEGATIVE ng/mL
Tramadol Scrn, Ur: NEGATIVE ng/mL
ZOLPIDEM, URINE: NEGATIVE ng/mL
pH, Initial: 5.6 pH (ref 4.5–8.9)

## 2014-09-24 ENCOUNTER — Other Ambulatory Visit: Payer: Self-pay | Admitting: *Deleted

## 2014-09-24 DIAGNOSIS — IMO0001 Reserved for inherently not codable concepts without codable children: Secondary | ICD-10-CM

## 2014-09-24 DIAGNOSIS — G43111 Migraine with aura, intractable, with status migrainosus: Secondary | ICD-10-CM

## 2014-09-24 MED ORDER — HYDROMORPHONE HCL 4 MG PO TABS: 4.0000 mg | ORAL_TABLET | Freq: Every day | ORAL | Status: DC | PRN

## 2014-09-24 NOTE — Addendum Note (Signed)
Addended by: Bayard Hugger on: 09/24/2014 10:06 AM   Modules accepted: Orders

## 2014-09-24 NOTE — Telephone Encounter (Signed)
Received message from patient, went to [facility] usual [facility] to get her RX filled.  Pharmacist wrote on her RX and she is going out of town Tomorrow 09/25/2014 and needs her refills.  Called pharmacy and spoke with the pharmacist and told her that is was ok to fill the old RX 3 days early.  They stated that she took the RX with her.  Called patient back and she then told me the pharmacist wrote on it and no one will fill it now.  I spoke with [name] [name] and explain the situation to her.  Refilled RX. OK to fill 3 days early

## 2014-09-24 NOTE — Telephone Encounter (Signed)
[name] [name] came to pick up her new prescription. Will allow for her to fill medication early due to being out of town .

## 2014-10-05 ENCOUNTER — Other Ambulatory Visit: Payer: Self-pay | Admitting: Gastroenterology

## 2014-10-29 ENCOUNTER — Encounter: Payer: Self-pay | Admitting: Physical Medicine & Rehabilitation

## 2014-10-29 ENCOUNTER — Encounter: Payer: Medicare Other | Attending: Registered Nurse | Admitting: Physical Medicine & Rehabilitation

## 2014-10-29 VITALS — BP 134/48 | HR 85 | Resp 14

## 2014-10-29 DIAGNOSIS — M791 Myalgia: Secondary | ICD-10-CM | POA: Diagnosis present

## 2014-10-29 DIAGNOSIS — G43719 Chronic migraine without aura, intractable, without status migrainosus: Secondary | ICD-10-CM

## 2014-10-29 DIAGNOSIS — IMO0001 Reserved for inherently not codable concepts without codable children: Secondary | ICD-10-CM

## 2014-10-29 DIAGNOSIS — M609 Myositis, unspecified: Secondary | ICD-10-CM | POA: Diagnosis present

## 2014-10-29 DIAGNOSIS — G894 Chronic pain syndrome: Secondary | ICD-10-CM | POA: Insufficient documentation

## 2014-10-29 DIAGNOSIS — G43111 Migraine with aura, intractable, with status migrainosus: Secondary | ICD-10-CM | POA: Insufficient documentation

## 2014-10-29 DIAGNOSIS — Z79899 Other long term (current) drug therapy: Secondary | ICD-10-CM | POA: Insufficient documentation

## 2014-10-29 DIAGNOSIS — G43119 Migraine with aura, intractable, without status migrainosus: Secondary | ICD-10-CM

## 2014-10-29 DIAGNOSIS — Z5181 Encounter for therapeutic drug level monitoring: Secondary | ICD-10-CM | POA: Diagnosis present

## 2014-10-29 DIAGNOSIS — M47896 Other spondylosis, lumbar region: Secondary | ICD-10-CM

## 2014-10-29 MED ORDER — HYDROMORPHONE HCL 4 MG PO TABS: 4.0000 mg | ORAL_TABLET | Freq: Every day | ORAL | Status: DC | PRN

## 2014-10-29 NOTE — Progress Notes (Signed)
Subjective:    Patient ID: [name] [name], female    DOB: 15-Sep-1976, 39 y.o.   MRN: [id number]  HPI   [name] [name] is back regarding her chronic pain syndrome. She had acute appendicitis in the Fall which required an appendectomy.  She has struggled with core pain and strength since then.   She has had to use a little more dilaudid for her pain.   She is also noticing that she's more sensitive to smells which trigger her migraine symptoms. The nasal imitrex seems to work the best for her still.      Pain Inventory Average Pain 6 Pain Right Now 7 My pain is constant, dull, stabbing and aching  In the last 24 hours, has pain interfered with the following? General activity 6 Relation with others 3 Enjoyment of life 3 What TIME of day is your pain at its worst? evening Sleep (in general) Fair  Pain is worse with: n/a Pain improves with: medication Relief from Meds: na  Mobility walk without assistance  Function Do you have any goals in this area?  no  Neuro/Psych No problems in this area  Prior Studies Any changes since last visit?  no  Physicians involved in your care Any changes since last visit?  no   Family History  Problem Relation Age of Onset  . Heart disease Mother   . Diabetes Mother   . Heart disease Father   . Prostate cancer Father   . Heart disease    . Prostate cancer    . Diabetes    . Colon cancer Maternal Grandmother    History   Social History  . Marital Status: Married    Spouse Name: N/A    Number of Children: N/A  . Years of Education: N/A   Social History Main Topics  . Smoking status: Never Smoker   . Smokeless tobacco: Never Used  . Alcohol Use: 0.6 oz/week    1 Glasses of wine per week  . Drug Use: No  . Sexual Activity: Yes   Other Topics Concern  . None   Social History Narrative   Past Surgical History  Procedure Laterality Date  . Transphenoidal / transnasal hypophysectomy / resection pituitary tumor  1999   resection of benign pituitary tumor, [facility]  . Colonoscopy  01/03/2007    Dr. Silvano Rusk  . Esophagogastroduodenoscopy  09/29/2004    Dr. Kennedy Bucker  . Uterine fibroid surgery  2008  . Appendectomy  07/28/2014  . Laparoscopic appendectomy N/A 07/28/2014    Procedure: APPENDECTOMY LAPAROSCOPIC;  Surgeon: Autumn Messing III, MD;  Location: [place];  Service: General;  Laterality: N/A;   Past Medical History  Diagnosis Date  . Seasonal allergies   . GERD (gastroesophageal reflux disease)   . IBS (irritable bowel syndrome)   . Adenomatous colon polyp 2008  . Constipation   . Sciatica   . Hematochezia   . Internal hemorrhoids   . 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 Complication of anesthesia     "didn't wake up well/remembered stuff from during OR when I had pituitary surgery"  . DVT (deep vein thrombosis) in pregnancy 09/2013    "LLE; postpartum"  . Sleep apnea     "don't currently use CPAP" (07/28/2014)  . Prolactin secreting pituitary adenoma 1999  . H/O hiatal hernia   . Migraines     "couple times/wk" (07/28/2014)  . Arthritis     "jaw joints; neck" (07/28/2014)  .  Fibromyalgia   . Depression     hx  . Anxiety 1999/2000  . Panic disorder 1999/2000   BP 134/48 mmHg  Pulse 85  Resp 14  SpO2 100%  Opioid Risk Score:   Fall Risk Score:    Review of Systems  All other systems reviewed and are negative.      Objective:   Physical Exam   Constitutional: She is oriented to person, place, and time. She appears well-developed and well-nourished.  HENT:  Head: Normocephalic and atraumatic.  Eyes: Pupils are equal, round, and reactive to light.  Cardiovascular: Normal rate and regular rhythm.  Pulmonary/Chest: Effort normal and breath sounds normal. No respiratory distress. She has no wheezes.  Abdominal: Soft. Small abdominal scar near umbilicus Musculoskeletal:  Mild levoscoliosis of the back with apex around T12.  Neurological: She is alert and  oriented to person, place, and time. No cranial nerve deficit. Coordination normal.  Psychiatric: She has a normal mood and affect. Her behavior is normal. Judgment and thought content normal. Good spirits.  Assessment & Plan:    ASSESSMENT:  1. Migraine headaches.   2. Fibromyalgia syndrome.  3. Depression.  4. Chronic low back pain and cervicalgia. The patient with myofascial  and spondylosis components.    PLAN:  1. Made a referral to outpt therapies at [facility] to address core muscle strengthenging, modalities--HEP  2. Botox for migraine, 200u next month   3. Continue with nasal imitrex for breakthrough migraines 20 mg daily p.r.n.  4. Dilaudid for severe breakthrough pain, 4-6mg  qd prn #50---goals is to taper.  5. DHEA is still an option for her given hormonally mediated headaches and fibro symptoms. Recommend continuing her omega 3 also  6. I will see her back pending injections above- next month.

## 2014-10-29 NOTE — Patient Instructions (Signed)
PLEASE CALL ME WITH ANY PROBLEMS OR QUESTIONS (#[phone]).      

## 2014-11-02 ENCOUNTER — Telehealth: Payer: Self-pay | Admitting: *Deleted

## 2014-11-02 DIAGNOSIS — G43111 Migraine with aura, intractable, with status migrainosus: Secondary | ICD-10-CM

## 2014-11-02 DIAGNOSIS — IMO0001 Reserved for inherently not codable concepts without codable children: Secondary | ICD-10-CM

## 2014-11-02 NOTE — Telephone Encounter (Signed)
Pt says she saw Dr. [name] on 10/29/14 and was not feeling well at the time. Pt claims her cough is getting worse and is wondering if she should go ahead an get her Rx for Dilaudid filled or if there is something better to take for the cough and if that is something we could prescribe or can she go to urgent care. At this point i called the patient to get clarification from the pt and to remind the pt of our clinic policy regarding cough syrup containing hydrocodone. The pt then said that is exactly why she called, to see if it would be okay and if we could provide the script or if she had permission to go to urgent care. Pt then went on to say that the pharmacy would not fill the hydromorphone script because the quantity did not match the sig. Please advise

## 2014-11-05 MED ORDER — HYDROMORPHONE HCL 4 MG PO TABS: ORAL_TABLET | ORAL | Status: DC

## 2014-11-05 NOTE — Telephone Encounter (Signed)
Patient is a bit upset - this is her second phone call.  She is stating that she cannot get her RX filled because the SIG is incorrect.    Called patient back and was informed that the pharmacy held the [place] [phone]. Calling pharmacist - they need a new RX. I told pharmacist to destroy that RX and I would print out another one and give it to the patient.  Printed out RX to have [employer]... Also what can she take for her cough.  Has tried every OTC cough remedy there is and nothing works at night.  Dr. [name] stated she cannot take the cough syrup with codeine, he suggested that she contact her pulmonologist.  Lavella Lemons Pearls is what he recommends for her cough.  She can get that from her PCP . Called patient and let her know that her RX is ready up front.

## 2014-11-22 ENCOUNTER — Ambulatory Visit: Payer: Medicare Other | Attending: Physical Medicine & Rehabilitation | Admitting: Physical Therapy

## 2014-11-22 DIAGNOSIS — M546 Pain in thoracic spine: Secondary | ICD-10-CM | POA: Insufficient documentation

## 2014-11-22 DIAGNOSIS — R293 Abnormal posture: Secondary | ICD-10-CM | POA: Diagnosis not present

## 2014-11-22 DIAGNOSIS — M545 Low back pain, unspecified: Secondary | ICD-10-CM

## 2014-11-22 DIAGNOSIS — M542 Cervicalgia: Secondary | ICD-10-CM | POA: Diagnosis not present

## 2014-11-22 NOTE — Therapy (Signed)
[place], [place], [address] Phone: [phone]   Fax:  [phone]  Physical Therapy Evaluation  Patient Details  Name: [name] [name] MRN: [id number] Date of Birth: 1975/11/17 Referring Provider:  Meredith Staggers, MD  Encounter Date: 11/22/2014      PT End of Session - 11/22/14 1311    Visit Number 1   Number of Visits 8   Date for PT Re-Evaluation 01/21/15   PT Start Time 1024   PT Stop Time 1115   PT Time Calculation (min) 51 min   Activity Tolerance Patient tolerated treatment well   Behavior During Therapy [facility] for tasks assessed/performed      Past Medical History  Diagnosis Date  . Seasonal allergies   . GERD (gastroesophageal reflux disease)   . IBS (irritable bowel syndrome)   . Adenomatous colon polyp 2008  . Constipation   . Sciatica   . Hematochezia   . Internal hemorrhoids   . 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 Complication of anesthesia     "didn't wake up well/remembered stuff from during OR when I had pituitary surgery"  . DVT (deep vein thrombosis) in pregnancy 09/2013    "LLE; postpartum"  . Sleep apnea     "don't currently use CPAP" (07/28/2014)  . Prolactin secreting pituitary adenoma 1999  . H/O hiatal hernia   . Migraines     "couple times/wk" (07/28/2014)  . Arthritis     "jaw joints; neck" (07/28/2014)  . Fibromyalgia   . Depression     hx  . Anxiety 1999/2000  . Panic disorder 1999/2000    Past Surgical History  Procedure Laterality Date  . Transphenoidal / transnasal hypophysectomy / resection pituitary tumor  1999    resection of benign pituitary tumor, [facility]  . Colonoscopy  01/03/2007    Dr. Silvano Rusk  . Esophagogastroduodenoscopy  09/29/2004    Dr. Kennedy Bucker  . Uterine fibroid surgery  2008  . Appendectomy  07/28/2014  . Laparoscopic appendectomy N/A 07/28/2014    Procedure: APPENDECTOMY LAPAROSCOPIC;  Surgeon: Autumn Messing III,  MD;  Location: [place];  Service: General;  Laterality: N/A;    There were no vitals taken for this visit.  Visit Diagnosis:  Neck pain - Plan: PT plan of care cert/re-cert  Bilateral thoracic back pain - Plan: PT plan of care cert/re-cert  Right-sided low back pain without sciatica - Plan: PT plan of care cert/re-cert      Subjective Assessment - 11/22/14 1027    Symptoms Pt is a 39 y/o female who presents to OPPT for neck and back pain.  Pt reports chronic neck and back pain however increased since appendectomy in mid Oct 2015.     Limitations Sitting;House hold activities   How long can you sit comfortably? 15 min   How long can you stand comfortably? better than sitting   Patient Stated Goals to improve pain; lifting 39 y/o without pain   Currently in Pain? Yes   Pain Score 8    Pain Location Back  neck, mid back and R SIJ   Pain Orientation Mid;Upper;Right;Lower   Pain Descriptors / Indicators Aching;Dull;Sharp   Pain Type Chronic pain   Pain Radiating Towards lateral and post to knee on R side   Pain Onset More than a month ago   Pain Frequency Constant   Aggravating Factors  lifting; sitting, driving; repetitive motion; reading   Pain Relieving  Factors exercise (swimming), ice, massage          [facility] PT Assessment - 11/22/14 1031    Assessment   Medical Diagnosis neck and back pain following appendectomy   Onset Date --  Oct 2015   Next MD Visit 3-4 weeks   Prior Therapy 2 yrs ago   Precautions   Precautions None   Restrictions   Weight Bearing Restrictions No   Balance Screen   Has the patient fallen in the past 6 months No   Has the patient had a decrease in activity level because of a fear of falling?  No   Is the patient reluctant to leave their home because of a fear of falling?  No   Prior Function   Level of Independence Independent with basic ADLs;Independent with gait;Independent with transfers   Vocation Works at home   [employer] mother  of 83,27, and 61 year old   Leisure reading, music, arts   Cognition   Overall Cognitive Status Within Functional Limits for tasks assessed   Observation/Other Assessments   Focus on Therapeutic Outcomes (FOTO)  31 (69% limited; predicted 45% limited)    Posture/Postural Control   Posture/Postural Control Postural limitations   Postural Limitations Rounded Shoulders;Forward head   Posture Comments pt with reverse S curve; in standing R hip higher; supine L hip higher   AROM   Cervical Flexion 27  pain, pulling   Cervical Extension 30  dull pulling pain   Cervical - Right Side Bend 32  pain on left   Cervical - Left Side Bend 25  pain on right   Cervical - Right Rotation 50   Cervical - Left Rotation 55   Lumbar Flexion 70  with pain   Lumbar Extension 16  pain in low back   Lumbar - Right Side Bend 23   Lumbar - Left Side Bend 25   Strength   Right Hip Flexion 3/5   Right Hip Extension 2-/5   Right Hip ABduction 3+/5   Left Hip Flexion 4/5   Left Hip Extension 3/5   Left Hip ABduction 4/5   Right Knee Flexion 4-/5   Right Knee Extension 5/5   Left Knee Flexion 5/5   Left Knee Extension 5/5   Palpation   Palpation tender to palpation R SIJ and bil paraspinals (R>L); bil upper traps                  [facility] Adult PT Treatment/Exercise - 11/22/14 1031    Modalities   Modalities Electrical Stimulation;Moist Heat   Moist Heat Therapy   Number Minutes Moist Heat 15 Minutes   Moist Heat Location Other (comment)  low back and neck   Electrical Stimulation   Electrical Stimulation Location low back and neck   Electrical Stimulation Action premod   Electrical Stimulation Parameters to tolerance   Electrical Stimulation Goals Pain                     PT Long Term Goals - 11/22/14 1315    PT LONG TERM GOAL #1   Title independent with HEP and verbalize understanding for exercise progression (12/20/14)   Time 4   Period Weeks   Status New   PT LONG  TERM GOAL #2   Title report pain < 5/10 with exercise (12/20/14)   Time 4   Period Weeks   Status New   PT LONG TERM GOAL #3   Title verbalize understanding  of posture/body mechanics to reduce risk of reinjury (12/20/14)   Time 4   Period Weeks   Status New   PT LONG TERM GOAL #4   Title improve limited cervical and lumbar ROM by 5 degrees for improved function (12/20/14)   Time 4   Period Weeks   Status New               Plan - 20-Dec-2014 1312    Clinical Impression Statement Pt presents to OPPT with neck and low back pain consistent with muscle tightness/spasms as well as decreased strength following appendectomy.  Pt will benefit from PT to decrease muscle spasms and inflammation and return to community exercise and pilates home program.   Pt will benefit from skilled therapeutic intervention in order to improve on the following deficits Impaired flexibility;Pain;Improper body mechanics;Decreased range of motion;Decreased strength;Increased muscle spasms;Postural dysfunction;Decreased activity tolerance   Rehab Potential Good   PT Frequency 1x / week  recommend 2x/wk and may see 2x/wk if pt able to attend   PT Duration 4 weeks   PT Treatment/Interventions ADLs/Self Care Home Management;Electrical Stimulation;Gait training;Therapeutic exercise;Patient/family education;Moist Heat;Functional mobility training;Neuromuscular re-education;Manual techniques;Passive range of motion;Therapeutic activities;Ultrasound   PT Next Visit Plan ? dry needling; modalities, core strengthening   Consulted and Agree with Plan of Care Patient          G-Codes - 12/20/2014 1317    Functional Assessment Tool Used FOTO 69% limited   Functional Limitation Mobility: Walking and moving around   Mobility: Walking and Moving Around Current Status [phone]) At least 60 percent but less than 80 percent impaired, limited or restricted   Mobility: Walking and Moving Around Goal Status [phone]) At least 40 percent  but less than 60 percent impaired, limited or restricted       Problem List Patient Active Problem List   Diagnosis Date Noted  . Acute appendicitis 07/28/2014  . Other malaise and fatigue 03/09/2014  . Unspecified vitamin D deficiency 03/09/2014  . Postpartum care following vaginal delivery (12/18) 10/08/2013  . Normal labor and delivery 10/08/2013  . Migraine with aura, intractable 01/20/2013  . Headache, chronic migraine without aura, intractable 03/18/2012  . Myalgia and myositis 03/18/2012  . Lumbar spondylosis 03/18/2012  . PERSONAL HISTORY OF ALLERGY TO EGGS- GI SENSITIVITY 05/04/2010  . CHANGE IN BOWELS 05/02/2010  . IRRITABLE BOWEL SYNDROME 12/04/2009  . NONSPECIFIC ABN FINDNG RAD&OTH EXAM BILARY TRCT 11/30/2009  . GERD 01/31/2009  . ABDOMINAL PAIN-EPIGASTRIC 01/31/2009  . PERSONAL HX COLONIC POLYPS 12/14/2008  . INTERNAL HEMORRHOIDS 01/03/2007  . HIATAL HERNIA 09/29/2004   Laureen Abrahams, PT, DPT 12-20-2014 1:19 PM  [facility] [address] [place], [place], [address] Phone: [phone]   Fax:  [phone]

## 2014-11-29 ENCOUNTER — Telehealth: Payer: Self-pay | Admitting: *Deleted

## 2014-11-29 NOTE — Telephone Encounter (Signed)
[name] [name] has  Called requesting a refill on her hydromorphone.  She just had this filled by [facility] 11/06/14 but her return appt for botox is 12/07/14 and the 30 day mark is 12/05/14.  We are changing her appt from 12/07/14 to tomorrow 11/30/14 @11 :40.

## 2014-11-30 ENCOUNTER — Encounter: Payer: Self-pay | Admitting: Physical Medicine & Rehabilitation

## 2014-11-30 ENCOUNTER — Encounter: Payer: Medicare Other | Attending: Registered Nurse | Admitting: Physical Medicine & Rehabilitation

## 2014-11-30 VITALS — BP 133/74 | HR 105 | Resp 14

## 2014-11-30 DIAGNOSIS — J069 Acute upper respiratory infection, unspecified: Secondary | ICD-10-CM

## 2014-11-30 DIAGNOSIS — M791 Myalgia: Secondary | ICD-10-CM | POA: Insufficient documentation

## 2014-11-30 DIAGNOSIS — G894 Chronic pain syndrome: Secondary | ICD-10-CM | POA: Insufficient documentation

## 2014-11-30 DIAGNOSIS — Z79899 Other long term (current) drug therapy: Secondary | ICD-10-CM | POA: Insufficient documentation

## 2014-11-30 DIAGNOSIS — IMO0001 Reserved for inherently not codable concepts without codable children: Secondary | ICD-10-CM

## 2014-11-30 DIAGNOSIS — M609 Myositis, unspecified: Secondary | ICD-10-CM | POA: Insufficient documentation

## 2014-11-30 DIAGNOSIS — G43111 Migraine with aura, intractable, with status migrainosus: Secondary | ICD-10-CM | POA: Insufficient documentation

## 2014-11-30 DIAGNOSIS — Z5181 Encounter for therapeutic drug level monitoring: Secondary | ICD-10-CM | POA: Insufficient documentation

## 2014-11-30 MED ORDER — HYDROMORPHONE HCL 4 MG PO TABS: ORAL_TABLET | ORAL | Status: DC

## 2014-11-30 MED ORDER — BENZONATATE 100 MG PO CAPS: 100.0000 mg | ORAL_CAPSULE | Freq: Three times a day (TID) | ORAL | Status: DC | PRN

## 2014-11-30 NOTE — Progress Notes (Signed)
Subjective:    Patient ID: [name] [name], female    DOB: 08/19/1976, 39 y.o.   MRN: [id number]  HPI  Pain Inventory Average Pain 6 Pain Right Now 8 My pain is constant, sharp, dull, stabbing and aching  In the last 24 hours, has pain interfered with the following? General activity 6 Relation with others 6 Enjoyment of life 6 What TIME of day is your pain at its worst? morning daytime and evening Sleep (in general) NA  Pain is worse with: sitting, inactivity, standing and some activites Pain improves with: rest, heat/ice, therapy/exercise, medication, TENS and injections Relief from Meds: 6  Mobility walk without assistance  Function Do you have any goals in this area?  no  Neuro/Psych No problems in this area  Prior Studies Any changes since last visit?  no  Physicians involved in your care Any changes since last visit?  no   Family History  Problem Relation Age of Onset  . Heart disease Mother   . Diabetes Mother   . Heart disease Father   . Prostate cancer Father   . Heart disease    . Prostate cancer    . Diabetes    . Colon cancer Maternal Grandmother    History   Social History  . Marital Status: Married    Spouse Name: N/A    Number of Children: N/A  . Years of Education: N/A   Social History Main Topics  . Smoking status: Never Smoker   . Smokeless tobacco: Never Used  . Alcohol Use: 0.6 oz/week    1 Glasses of wine per week  . Drug Use: No  . Sexual Activity: Yes   Other Topics Concern  . None   Social History Narrative   Past Surgical History  Procedure Laterality Date  . Transphenoidal / transnasal hypophysectomy / resection pituitary tumor  1999    resection of benign pituitary tumor, [facility]  . Colonoscopy  01/03/2007    Dr. Silvano Rusk  . Esophagogastroduodenoscopy  09/29/2004    Dr. Kennedy Bucker  . Uterine fibroid surgery  2008  . Appendectomy  07/28/2014  . Laparoscopic appendectomy N/A 07/28/2014   Procedure: APPENDECTOMY LAPAROSCOPIC;  Surgeon: Autumn Messing III, MD;  Location: [place];  Service: General;  Laterality: N/A;   Past Medical History  Diagnosis Date  . Seasonal allergies   . GERD (gastroesophageal reflux disease)   . IBS (irritable bowel syndrome)   . Adenomatous colon polyp 2008  . Constipation   . Sciatica   . Hematochezia   . Internal hemorrhoids   . 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 Complication of anesthesia     "didn't wake up well/remembered stuff from during OR when I had pituitary surgery"  . DVT (deep vein thrombosis) in pregnancy 09/2013    "LLE; postpartum"  . Sleep apnea     "don't currently use CPAP" (07/28/2014)  . Prolactin secreting pituitary adenoma 1999  . H/O hiatal hernia   . Migraines     "couple times/wk" (07/28/2014)  . Arthritis     "jaw joints; neck" (07/28/2014)  . Fibromyalgia   . Depression     hx  . Anxiety 1999/2000  . Panic disorder 1999/2000   BP 133/74 mmHg  Pulse 105  Resp 14  SpO2 99%  Opioid Risk Score:   Fall Risk Score: Low Fall Risk (0-5 points) (previoulsy edcuated and given handout)  Review of Systems  Neurological: Positive for headaches.  All other systems reviewed and are negative.      Objective:   Physical Exam        Assessment & Plan:  Pt unable to stay---daughter sick at school. Had to leave---filled dilaudid.  Gave her tessalon perles for persistent cough. Follow up at next available

## 2014-12-07 ENCOUNTER — Ambulatory Visit: Payer: Medicare Other | Admitting: Physical Medicine & Rehabilitation

## 2014-12-14 ENCOUNTER — Ambulatory Visit: Payer: Medicare Other | Admitting: Physical Therapy

## 2014-12-14 DIAGNOSIS — R293 Abnormal posture: Secondary | ICD-10-CM

## 2014-12-14 DIAGNOSIS — M542 Cervicalgia: Secondary | ICD-10-CM

## 2014-12-14 DIAGNOSIS — M545 Low back pain, unspecified: Secondary | ICD-10-CM

## 2014-12-14 DIAGNOSIS — M546 Pain in thoracic spine: Secondary | ICD-10-CM

## 2014-12-14 NOTE — Patient Instructions (Addendum)
Trigger Point Dry Needling  . What is Trigger Point Dry Needling (DN)? o DN is a physical therapy technique used to treat muscle pain and dysfunction. Specifically, DN helps deactivate muscle trigger points (muscle knots).  o A thin filiform needle is used to penetrate the skin and stimulate the underlying trigger point. The goal is for a local twitch response (LTR) to occur and for the trigger point to relax. No medication of any kind is injected during the procedure.   . What Does Trigger Point Dry Needling Feel Like?  o The procedure feels different for each individual patient. Some patients report that they do not actually feel the needle enter the skin and overall the process is not painful. Very mild bleeding may occur. However, many patients feel a deep cramping in the muscle in which the needle was inserted. This is the local twitch response.   Marland Kitchen How Will I feel after the treatment? o Soreness is normal, and the onset of soreness may not occur for a few hours. Typically this soreness does not last longer than two days.  o Bruising is uncommon, however; ice can be used to decrease any possible bruising.  o In rare cases feeling tired or nauseous after the treatment is normal. In addition, your symptoms may get worse before they get better, this period will typically not last longer than 24 hours.   . What Can I do After My Treatment? o Increase your hydration by drinking more water for the next 24 hours. o You may place ice or heat on the areas treated that have become sore, however, do not use heat on inflamed or bruised areas. Heat often brings more relief post needling. o You can continue your regular activities, but vigorous activity is not recommended initially after the treatment for 24 hours.  DN is best combined with other physical therapy such as strengthening, stretching, and other therapies.  Given Core Stabilzation handout    AB set/ Transversus Abdominis  10 x with 10 sec  hold  You will need to get your abd set before the following exercises. Supine clams one leg at a time.  10 x right and left Marching in hooklying  10 x right and left Heel slide 10 x right and left Bridging 20 x with 3 sec hold and intentional and slow lowering of pelvis to mat  May increase to 2 sets of 10 for above exercise  Pt given levator and upper trap handout for stretch and demo 30 sec stretch 2 - 3 times  Voncille Lo, PT 12/14/2014 11:47 AM Phone: [phone] Fax: [phone]

## 2014-12-14 NOTE — Therapy (Signed)
[place], [place], [address] Phone: [phone]   Fax:  [phone]  Physical Therapy Treatment  Patient Details  Name: [name] [name] MRN: [id number] Date of Birth: [birth year] Referring Provider:  Meredith Staggers, MD  Encounter Date: 12/14/2014      PT End of Session - 12/14/14 1127    Visit Number 2   Number of Visits 8   Date for PT Re-Evaluation 01/21/15      Past Medical History  Diagnosis Date  . Seasonal allergies   . GERD (gastroesophageal reflux disease)   . IBS (irritable bowel syndrome)   . Adenomatous colon polyp 2008  . Constipation   . Sciatica   . Hematochezia   . Internal hemorrhoids   . 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 Complication of anesthesia     "didn't wake up well/remembered stuff from during OR when I had pituitary surgery"  . DVT (deep vein thrombosis) in pregnancy 09/2013    "LLE; postpartum"  . Sleep apnea     "don't currently use CPAP" (07/28/2014)  . Prolactin secreting pituitary adenoma 1999  . H/O hiatal hernia   . Migraines     "couple times/wk" (07/28/2014)  . Arthritis     "jaw joints; neck" (07/28/2014)  . Fibromyalgia   . Depression     hx  . Anxiety 1999/2000  . Panic disorder 1999/2000    Past Surgical History  Procedure Laterality Date  . Transphenoidal / transnasal hypophysectomy / resection pituitary tumor  1999    resection of benign pituitary tumor, [facility]  . Colonoscopy  01/03/2007    Dr. Silvano Rusk  . Esophagogastroduodenoscopy  09/29/2004    Dr. Kennedy Bucker  . Uterine fibroid surgery  2008  . Appendectomy  07/28/2014  . Laparoscopic appendectomy N/A 07/28/2014    Procedure: APPENDECTOMY LAPAROSCOPIC;  Surgeon: Autumn Messing III, MD;  Location: [place];  Service: General;  Laterality: N/A;    There were no vitals taken for this visit.  Visit Diagnosis:  Neck pain  Bilateral thoracic back pain  Right-sided low  back pain without sciatica  Abnormal posture      Subjective Assessment - 12/14/14 1107    Symptoms Pt presents 3 weeks after eval due to difficulty of getting scheduled at clinic and childcare issues.   Pain in bilateral neck /upper trapezius.  lifting children and carry children on hip,  drying increased pain.  Pt  states she has headache  several times a week .    Limitations Sitting;House hold activities   How long can you sit comfortably? 15 min   How long can you stand comfortably? better than sitting   Patient Stated Goals to improve pain; lifting 39 y/o without pain   Currently in Pain? Yes   Pain Score 7    Pain Location Back   Pain Orientation Upper;Mid;Lower   Pain Descriptors / Indicators Aching;Burning;Dull   Pain Type Chronic pain   Pain Radiating Towards to Right side down to knee in low back                    [facility] Adult PT Treatment/Exercise - 12/14/14 0001    Exercises   Exercises --   Lumbar Exercises: Supine   Ab Set 10 reps  10 sec hold with VC and TC by PT bilaterally one at a time   Clam 10 reps;3 seconds  VC and bilaterally one leg  at a time   Heel Slides 10 reps  VC bilaterally one leg at a time   Bent Knee Raise 10 reps;3 seconds  VC  bilaterally one leg at time with good motor control   Bridge 20 reps  VC for complete motion   Neck Exercises: Stretches   Upper Trapezius Stretch 3 reps;30 seconds   Upper Trapezius Stretch Limitations VC   Levator Stretch 3 reps;30 seconds   VC          Trigger Point Dry Needling - 12/14/14 1131    Consent Given? Yes   Education Handout Provided Yes   Muscles Treated Upper Body Upper trapezius;Levator scapulae;Suboccipitals muscle group;Oblique capitus              PT Education - 12/14/14 1333    Education provided Yes   Education Details handouts for levator/trap stretch and Core stabilization and trigger point dry needling education and precautians   Person(s) Educated Patient    Methods Explanation;Demonstration;Verbal cues;Handout   Comprehension Verbalized understanding;Returned demonstration;Need further instruction             PT Long Term Goals - 12/14/14 1337    PT LONG TERM GOAL #1   Title independent with HEP and verbalize understanding for exercise progression (12/20/14)   Time 4   Status On-going   PT LONG TERM GOAL #2   Title report pain < 5/10 with exercise (12/20/14)   Time 4   Period Weeks   Status On-going   PT LONG TERM GOAL #3   Title verbalize understanding of posture/body mechanics to reduce risk of reinjury (12/20/14)   Time 4   Period Weeks   Status On-going   PT LONG TERM GOAL #4   Title improve limited cervical and lumbar ROM by 5 degrees for improved function (12/20/14)   Time 4   Period Weeks   Status On-going               Plan - 12/14/14 1334    Clinical Impression Statement Pt presents after 3 week absence due to inability to schedule her at clinic and childcare issues.  Pt requests trigger point dry needling and complains of constant upper trap trigger point pain.  Pt had immediate relief of upper trap bilaterlal pain post treatment with pallpable muscle lengtheing.  .  Will monitor for long term relief and subsequent sessions   Pt will benefit from skilled therapeutic intervention in order to improve on the following deficits Impaired flexibility;Pain;Improper body mechanics;Decreased range of motion;Decreased strength;Increased muscle spasms;Postural dysfunction;Decreased activity tolerance   Rehab Potential Good   PT Frequency 1x / week   PT Treatment/Interventions ADLs/Self Care Home Management;Electrical Stimulation;Gait training;Therapeutic exercise;Patient/family education;Moist Heat;Functional mobility training;Neuromuscular re-education;Manual techniques;Passive range of motion;Therapeutic activities;Ultrasound   PT Next Visit Plan Assess Dry needling and AROM of neck,  Assess need for further dry needling on  back, Continue Core strengthening progression        Problem List Patient Active Problem List   Diagnosis Date Noted  . Upper respiratory tract infection 11/30/2014  . Acute appendicitis 07/28/2014  . Other malaise and fatigue 03/09/2014  . Unspecified vitamin D deficiency 03/09/2014  . Postpartum care following vaginal delivery (12/18) 10/08/2013  . Normal labor and delivery 10/08/2013  . Migraine with aura, intractable 01/20/2013  . Headache, chronic migraine without aura, intractable 03/18/2012  . Myalgia and myositis 03/18/2012  . Lumbar spondylosis 03/18/2012  . PERSONAL HISTORY OF ALLERGY TO EGGS- GI SENSITIVITY 05/04/2010  . CHANGE  IN BOWELS 05/02/2010  . IRRITABLE BOWEL SYNDROME 12/04/2009  . NONSPECIFIC ABN FINDNG RAD&OTH EXAM BILARY TRCT 11/30/2009  . GERD 01/31/2009  . ABDOMINAL PAIN-EPIGASTRIC 01/31/2009  . PERSONAL HX COLONIC POLYPS 12/14/2008  . INTERNAL HEMORRHOIDS 01/03/2007  . HIATAL HERNIA 09/29/2004    Voncille Lo, PT 12/14/2014 1:39 PM Phone: [phone] Fax: [facility] [place], [place], [address] Phone: [phone]   Fax:  [phone]

## 2014-12-21 ENCOUNTER — Ambulatory Visit: Payer: Medicare Other | Admitting: Registered Nurse

## 2014-12-21 ENCOUNTER — Encounter: Payer: Self-pay | Admitting: Registered Nurse

## 2014-12-21 ENCOUNTER — Encounter: Payer: Medicare Other | Attending: Registered Nurse | Admitting: Registered Nurse

## 2014-12-21 VITALS — BP 136/80 | HR 104 | Resp 14

## 2014-12-21 DIAGNOSIS — G894 Chronic pain syndrome: Secondary | ICD-10-CM | POA: Insufficient documentation

## 2014-12-21 DIAGNOSIS — M791 Myalgia: Secondary | ICD-10-CM | POA: Diagnosis present

## 2014-12-21 DIAGNOSIS — Z5181 Encounter for therapeutic drug level monitoring: Secondary | ICD-10-CM | POA: Diagnosis present

## 2014-12-21 DIAGNOSIS — G43719 Chronic migraine without aura, intractable, without status migrainosus: Secondary | ICD-10-CM

## 2014-12-21 DIAGNOSIS — G43111 Migraine with aura, intractable, with status migrainosus: Secondary | ICD-10-CM | POA: Insufficient documentation

## 2014-12-21 DIAGNOSIS — M609 Myositis, unspecified: Secondary | ICD-10-CM | POA: Insufficient documentation

## 2014-12-21 DIAGNOSIS — Z79899 Other long term (current) drug therapy: Secondary | ICD-10-CM | POA: Insufficient documentation

## 2014-12-21 DIAGNOSIS — IMO0001 Reserved for inherently not codable concepts without codable children: Secondary | ICD-10-CM

## 2014-12-21 MED ORDER — HYDROMORPHONE HCL 4 MG PO TABS: ORAL_TABLET | ORAL | Status: DC

## 2014-12-21 NOTE — Progress Notes (Signed)
Subjective:    Patient ID: [name] [name], female    DOB: [birth year], 39 y.o.   MRN: [id number]  HPI: [name] is a 39 year old female who returns for follow up for chronic pain and medication refill. She says her pain is located in her neck and mid- lower back. She rates her pain 8. Her current exercise regime is yoga, swimming and walking. She's awaiting to start Dry Needling with Physical Therapy. Also complaining of frequent neck spasms. She's looking for increase in her medication or a change in therapy, explain will discuss with Dr. [name] and give her a call she verbalizes understanding. She's 8 days early with her medications.  Pain Inventory Average Pain 6 Pain Right Now 8 My pain is constant, sharp, dull and stabbing  In the last 24 hours, has pain interfered with the following? General activity 9 Relation with others 7 Enjoyment of life 8 What TIME of day is your pain at its worst? morning, daytime and evening Sleep (in general) Poor  Pain is worse with: walking, bending, sitting, inactivity and standing Pain improves with: rest and medication Relief from Meds: 5  Mobility walk without assistance how many minutes can you walk? 10 ability to climb steps?  yes do you drive?  yes  Function disabled: date disabled .  Neuro/Psych tingling spasms anxiety  Prior Studies Any changes since last visit?  no  Physicians involved in your care Any changes since last visit?  no   Family History  Problem Relation Age of Onset  . Heart disease Mother   . Diabetes Mother   . Heart disease Father   . Prostate cancer Father   . Heart disease    . Prostate cancer    . Diabetes    . Colon cancer Maternal Grandmother    History   Social History  . Marital Status: Married    Spouse Name: N/A  . Number of Children: N/A  . Years of Education: N/A   Social History Main Topics  . Smoking status: Never Smoker   . Smokeless tobacco: Never Used  . Alcohol  Use: 0.6 oz/week    1 Glasses of wine per week  . Drug Use: No  . Sexual Activity: Yes   Other Topics Concern  . None   Social History Narrative   Past Surgical History  Procedure Laterality Date  . Transphenoidal / transnasal hypophysectomy / resection pituitary tumor  1999    resection of benign pituitary tumor, [facility]  . Colonoscopy  01/03/2007    Dr. Silvano Rusk  . Esophagogastroduodenoscopy  09/29/2004    Dr. Kennedy Bucker  . Uterine fibroid surgery  2008  . Appendectomy  07/28/2014  . Laparoscopic appendectomy N/A 07/28/2014    Procedure: APPENDECTOMY LAPAROSCOPIC;  Surgeon: Autumn Messing III, MD;  Location: [facility];  Service: General;  Laterality: N/A;   Past Medical History  Diagnosis Date  . Seasonal allergies   . GERD (gastroesophageal reflux disease)   . IBS (irritable bowel syndrome)   . Adenomatous colon polyp 2008  . Constipation   . Sciatica   . Hematochezia   . Internal hemorrhoids   . 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 Complication of anesthesia     "didn't wake up well/remembered stuff from during OR when I had pituitary surgery"  . DVT (deep vein thrombosis) in pregnancy 09/2013    "LLE; postpartum"  . Sleep apnea     "don't  currently use CPAP" (07/28/2014)  . Prolactin secreting pituitary adenoma 1999  . H/O hiatal hernia   . Migraines     "couple times/wk" (07/28/2014)  . Arthritis     "jaw joints; neck" (07/28/2014)  . Fibromyalgia   . Depression     hx  . Anxiety 1999/2000  . Panic disorder 1999/2000   BP 136/80 mmHg  Pulse 104  Resp 14  SpO2 98%  Opioid Risk Score:   Fall Risk Score: Low Fall Risk (0-5 points)  Review of Systems  Constitutional: Negative.   HENT: Negative.   Eyes: Negative.   Respiratory: Negative.   Cardiovascular: Negative.   Gastrointestinal: Negative.   Endocrine: Negative.   Genitourinary: Negative.   Musculoskeletal: Positive for myalgias, back pain, arthralgias and neck pain.  Skin: Negative.    Allergic/Immunologic: Negative.   Neurological: Negative.   Hematological: Negative.   Psychiatric/Behavioral: Negative.        Objective:   Physical Exam  Constitutional: She is oriented to person, place, and time. She appears well-developed and well-nourished.  HENT:  Head: Normocephalic and atraumatic.  Neck: Neck supple.  Cervical Paraspinal Tenderness: C-3- C-5 Decreased Cervical Flexion 30 Degrees  Cardiovascular: Normal rate and regular rhythm.   Pulmonary/Chest: Effort normal and breath sounds normal.  Musculoskeletal:  Normal Muscle Bulk and Muscle Testing Reveals: Upper Extremities: Full ROM and Muscle strength 5/5 Thoracic Paraspinal Tenderness: T-6-T-8 and T-10- T-12 Lumbar Paraspinal Tenderness: L-3- L-5 Lower Extremities: Full ROM and Muscle Strength 5/5  Right Lower Extremity Flexion Produces pain into Lumbar Left Lower Extremity Flexion Produces Pain into Hip Arises from chair with ease Narrow Based Gait   Neurological: She is alert and oriented to person, place, and time.  Skin: Skin is warm and dry.  Psychiatric: She has a normal mood and affect.  Nursing note and vitals reviewed.         Assessment & Plan:  1. Migraine headaches. Refilled Dilaudid 4-6mg  qd prn.   (1.5-2 tablets as needed for sever pain). Will fill today. 2. Fibromyalgia syndrome. Continue with exercise regime 3. Depression: Continue to Monitor 4. Chronic low back pain and cervicalgia. The patient with myofascial and spondylosis components. Continue with exercise and heat therapy.  20 minutes of face to face patient care time was spent during this visit. All questions were encouraged and answered.  F/U in 1 month

## 2014-12-22 ENCOUNTER — Encounter: Payer: Medicare Other | Admitting: Registered Nurse

## 2015-01-12 ENCOUNTER — Other Ambulatory Visit: Payer: Self-pay | Admitting: Physical Medicine & Rehabilitation

## 2015-01-12 ENCOUNTER — Encounter: Payer: Self-pay | Admitting: Physical Medicine & Rehabilitation

## 2015-01-12 ENCOUNTER — Encounter (HOSPITAL_BASED_OUTPATIENT_CLINIC_OR_DEPARTMENT_OTHER): Payer: Medicare Other | Admitting: Physical Medicine & Rehabilitation

## 2015-01-12 VITALS — BP 134/80 | HR 91 | Resp 14

## 2015-01-12 DIAGNOSIS — M609 Myositis, unspecified: Secondary | ICD-10-CM

## 2015-01-12 DIAGNOSIS — G894 Chronic pain syndrome: Secondary | ICD-10-CM | POA: Diagnosis not present

## 2015-01-12 DIAGNOSIS — G43111 Migraine with aura, intractable, with status migrainosus: Secondary | ICD-10-CM

## 2015-01-12 DIAGNOSIS — Z5181 Encounter for therapeutic drug level monitoring: Secondary | ICD-10-CM | POA: Diagnosis not present

## 2015-01-12 DIAGNOSIS — Z79899 Other long term (current) drug therapy: Secondary | ICD-10-CM

## 2015-01-12 DIAGNOSIS — IMO0001 Reserved for inherently not codable concepts without codable children: Secondary | ICD-10-CM

## 2015-01-12 DIAGNOSIS — M791 Myalgia: Secondary | ICD-10-CM

## 2015-01-12 MED ORDER — HYDROMORPHONE HCL 4 MG PO TABS: ORAL_TABLET | ORAL | Status: DC

## 2015-01-12 NOTE — Progress Notes (Signed)
Botox Injection for chronic migraine headaches  Dilution: 160 Units/ 35ml preservative free NS Indication: refractory headaches incompletely responsive to other more conservative measures.  Informed consent was obtained after describing risks and benefits of the procedure with the patient. This includes bleeding, bruising, infection, excessive weakness, or medication side effects. A REMS form is on file and signed. Needle: 27g 1/2 inch needle   Number of units per muscle:  Right temporalis 10 units 2 access points Right frontalis 30 units 4 access points Procerus 20 units 2 access points Left frontalis 30 units 4 access points Left temporalis 10 units 2 access points Right occipital 10 units 2 access Left Occipital   10 units 2 access pts Right Spenius capitis 10 units 2 acc Left Splenius capitis 10 units 2 acc Right trap 10u Left trap 10u  All injections were done after  after negative drawback for blood. The patient tolerated the procedure well. Post procedure instructions were given. A followup appointment was made.  Med refills were provided

## 2015-01-12 NOTE — Patient Instructions (Signed)
PLEASE CALL ME WITH ANY PROBLEMS OR QUESTIONS (#[phone]).      

## 2015-01-13 LAB — PRESCRIPTION MONITORING PROFILE (SOLSTAS)
AMPHETAMINE/METH: NEGATIVE ng/mL
BARBITURATE SCREEN, URINE: NEGATIVE ng/mL
Benzodiazepine Screen, Urine: NEGATIVE ng/mL
Buprenorphine, Urine: NEGATIVE ng/mL
CREATININE, URINE: 53.95 mg/dL (ref >20.0)
Cannabinoid Scrn, Ur: NEGATIVE ng/mL
Carisoprodol, Urine: NEGATIVE ng/mL
Cocaine Metabolites: NEGATIVE ng/mL
Fentanyl, Ur: NEGATIVE ng/mL
MDMA URINE: NEGATIVE ng/mL
Meperidine, Ur: NEGATIVE ng/mL
Methadone Screen, Urine: NEGATIVE ng/mL
NITRITES URINE, INITIAL: NEGATIVE ug/mL
OPIATE SCREEN, URINE: NEGATIVE ng/mL
Oxycodone Screen, Ur: NEGATIVE ng/mL
PROPOXYPHENE: NEGATIVE ng/mL
TAPENTADOLUR: NEGATIVE ng/mL
TRAMADOL UR: NEGATIVE ng/mL
ZOLPIDEM, URINE: NEGATIVE ng/mL
pH, Initial: 6.1 pH (ref 4.5–8.9)

## 2015-01-13 LAB — PMP ALCOHOL METABOLITE (ETG): Ethyl Glucuronide (EtG): NEGATIVE ng/mL

## 2015-01-25 ENCOUNTER — Encounter: Payer: Medicare Other | Admitting: Physical Therapy

## 2015-02-03 ENCOUNTER — Ambulatory Visit: Payer: Medicare Other | Attending: Physical Medicine & Rehabilitation | Admitting: Physical Therapy

## 2015-02-03 DIAGNOSIS — M542 Cervicalgia: Secondary | ICD-10-CM | POA: Insufficient documentation

## 2015-02-03 DIAGNOSIS — M545 Low back pain, unspecified: Secondary | ICD-10-CM

## 2015-02-03 DIAGNOSIS — R6889 Other general symptoms and signs: Secondary | ICD-10-CM

## 2015-02-03 DIAGNOSIS — M546 Pain in thoracic spine: Secondary | ICD-10-CM | POA: Diagnosis not present

## 2015-02-03 DIAGNOSIS — M533 Sacrococcygeal disorders, not elsewhere classified: Secondary | ICD-10-CM

## 2015-02-03 DIAGNOSIS — R293 Abnormal posture: Secondary | ICD-10-CM

## 2015-02-03 NOTE — Patient Instructions (Addendum)
Continue Exercise in WAter (Home) Extension: Thoracic With Lumbar Lock - Sitting   Sit with back against chair, knees bent, hands locked behind head. Breathe in, extending head and trunk over chair back. Breathe out. Hold position for ____ breaths. Repeat ___8-10_ times per set. Do ___1_ sets per session. Do ____ sessions per day.  Copyright  VHI. All rights reserved.   Lie on back with towel roll at T-9 to T-12 level and breathe deeply to self mobilize at joints while lying on back.  Continue home exercise with bridge exercise with ball squeeze for SI stabilization.    Voncille Lo, PT 02/03/2015 11:02 AM Phone: [phone] Fax: [phone]

## 2015-02-03 NOTE — Therapy (Signed)
[name], [place], [address] Phone: [phone]   Fax:  [phone]  Physical Therapy Treatment  Patient Details  Name: [name] [name] MRN: [id number] Date of Birth: 1976-02-08 Referring Provider:  Meredith Staggers, MD  Encounter Date: 02/03/2015      PT End of Session - 02/03/15 1019    Visit Number 3   Number of Visits 20   Date for PT Re-Evaluation 03/17/15   PT Start Time 1016   PT Stop Time 1110   PT Time Calculation (min) 54 min   Activity Tolerance Patient tolerated treatment well;Patient limited by pain   Behavior During Therapy [facility] for tasks assessed/performed      Past Medical History  Diagnosis Date  . Seasonal allergies   . GERD (gastroesophageal reflux disease)   . IBS (irritable bowel syndrome)   . Adenomatous colon polyp 2008  . Constipation   . Sciatica   . Hematochezia   . Internal hemorrhoids   . 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 Complication of anesthesia     "didn't wake up well/remembered stuff from during OR when I had pituitary surgery"  . DVT (deep vein thrombosis) in pregnancy 09/2013    "LLE; postpartum"  . Sleep apnea     "don't currently use CPAP" (07/28/2014)  . Prolactin secreting pituitary adenoma 1999  . H/O hiatal hernia   . Migraines     "couple times/wk" (07/28/2014)  . Arthritis     "jaw joints; neck" (07/28/2014)  . Fibromyalgia   . Depression     hx  . Anxiety 1999/2000  . Panic disorder 1999/2000    Past Surgical History  Procedure Laterality Date  . Transphenoidal / transnasal hypophysectomy / resection pituitary tumor  1999    resection of benign pituitary tumor, [facility]  . Colonoscopy  01/03/2007    Dr. Silvano Rusk  . Esophagogastroduodenoscopy  09/29/2004    Dr. Kennedy Bucker  . Uterine fibroid surgery  2008  . Appendectomy  07/28/2014  . Laparoscopic appendectomy N/A 07/28/2014    Procedure: APPENDECTOMY LAPAROSCOPIC;   Surgeon: Autumn Messing III, MD;  Location: [facility];  Service: General;  Laterality: N/A;    There were no vitals filed for this visit.  Visit Diagnosis:  Neck pain - Plan: PT plan of care cert/re-cert  Bilateral thoracic back pain - Plan: PT plan of care cert/re-cert  Right-sided low back pain without sciatica - Plan: PT plan of care cert/re-cert  Abnormal posture - Plan: PT plan of care cert/re-cert  SI (sacroiliac) pain - Plan: PT plan of care cert/re-cert  Activity intolerance - Plan: PT plan of care cert/re-cert      Subjective Assessment - 02/03/15 1023    Subjective Pt has had difficulty scheduling to due to PT availability and childcare.  Pt is the same with symptoms worsening in T 12 and SI pain.  Pt sneezes and coughs and pain increases.  Pt stat   Limitations Sitting;House hold activities   How long can you sit comfortably? 15 min   Currently in Pain? Yes   Pain Score 8    Pain Location Back  T-12 level on right and neck with headache last week migraine   Pain Orientation Right;Upper;Mid;Lower   Pain Descriptors / Indicators Aching;Burning;Dull   Pain Type Chronic pain   Aggravating Factors  lifting leg bending and squatting and lifting   Multiple Pain Sites Yes   Pain Score 8  Pain Location Sacrum   Pain Orientation Right   Pain Descriptors / Indicators Aching;Sore;Spasm;Sharp;Shooting   Pain Radiating Towards numbness down Right leg   Pain Onset More than a month ago   Pain Frequency Constant            [facility] PT Assessment - 02/03/15 0001    Observation/Other Assessments   Observations Right PSIS inferior to Left and not moving with lumbar flexion.   Focus on Therapeutic Outcomes (FOTO)  intake 16% limitation 84% predicted 45%   AROM   Cervical Flexion 27  pain, pulling   Cervical Extension 30  dull pulling pain   Cervical - Right Side Bend 32  pain on left   Cervical - Left Side Bend 25  pain on right   Cervical - Right Rotation 50   Cervical - Left  Rotation 55   Lumbar Flexion 62  with pain   Lumbar Extension 12  pain in low back   Lumbar - Right Side Bend 20  pain   Lumbar - Left Side Bend 25   Strength   Strength Assessment Site --  No change from last visit   Right Hip Flexion 3/5   Right Hip Extension 2-/5   Right Hip ABduction 3+/5   Left Hip Flexion 4/5   Left Hip Extension 3/5   Left Hip ABduction 4/5   Right Knee Flexion 4-/5   Right Knee Extension 5/5   Left Knee Flexion 5/5   Left Knee Extension 5/5   Palpation   Palpation tenderness on Right SI and/PSIS,  Pt also with erector spinae T10 to T-12 spasm, T-10 spinous process more posterior than rest                   [facility] Adult PT Treatment/Exercise - 02/03/15 0001    Lumbar Exercises: Seated   Other Seated Lumbar Exercises thoracic extension using chair 10 x    Moist Heat Therapy   Number Minutes Moist Heat 10 Minutes   Moist Heat Location --  mid back to low back   Manual Therapy   Joint Mobilization T-8 to T-12 PA mobs Grade 3, Sacral scrubbing and PA on R SI    Myofascial Release Quadratus Lumborum MFR  of Right in left sidelying   Passive ROM Muscle energy for SI on Right SI/PSIS Also used IASTYM noodle to assist with Thoracic PA glide at T- 10 to T-12     instructed in thoracic mobilization and care of SI self mob at home using towel and noodle           PT Education - 02/03/15 1405    Education provided Yes   Education Details Pt given handout for self thoracic mobilzation and home advice   Person(s) Educated Patient   Methods Explanation;Demonstration;Verbal cues   Comprehension Verbalized understanding;Returned demonstration             PT Long Term Goals - 02/03/15 1024    PT LONG TERM GOAL #1   Title independent with HEP and verbalize understanding for exercise progression and self mobilization (03-17-15)   Time 12   Period Weeks   Status Revised   PT LONG TERM GOAL #2   Title report pain < 5/10 with exercise  (03-17-15)   Time 12   Status Revised   PT LONG TERM GOAL #3   Title verbalize understanding of posture/body mechanics to reduce risk of reinjury (03-17-15)   Time 12   Period Weeks  Status Revised   PT LONG TERM GOAL #4   Title improve limited cervical and lumbar ROM by 5 degrees for improved function (03-17-15)   Time 12   Period Weeks   Status Revised   PT LONG TERM GOAL #5   Title Pt FOTO will improve from 84% limitation to 45% indicating improved functional mobility   Time 12   Period Weeks   Status New               Plan - Feb 28, 2015 1359    Clinical Impression Statement Pt last seen one month ago.  Pt has not been consistent with attendance due to inablitity to schedule with PT and childcare issues.  Pt has made no progress with lumbar and decreased in AROM of lumbar flex and extension and R side bend.  Pt upper trap slightly better but still with trigger point pain.  Pt limited in movement due to lower Thoracic and Right SI and lumbar pain.  Pt wants to try trigger point dry needling before returning to MD for further work up.  Pt  appears to have decrease pain post dry needling this visit.  Due to not coming in for over a month. and only attending 2 times.  Pt needs to be approved for recertification to continue treatment.  Pt would benefit from skilled PT to improve strength and AROM and spinal stability as she improved with only 1 time trigger point dry needkling of Upper trap   Pt will benefit from skilled therapeutic intervention in order to improve on the following deficits Impaired flexibility;Pain;Improper body mechanics;Decreased range of motion;Decreased strength;Increased muscle spasms;Postural dysfunction;Decreased activity tolerance   Rehab Potential Good   PT Frequency 2x / week   PT Duration 12 weeks   PT Treatment/Interventions ADLs/Self Care Home Management;Electrical Stimulation;Gait training;Therapeutic exercise;Patient/family education;Moist Heat;Functional  mobility training;Neuromuscular re-education;Manual techniques;Passive range of motion;Therapeutic activities;Ultrasound  iontophoresis   PT Next Visit Plan continue Dry needling and assess. continue Spinal stabillization   Consulted and Agree with Plan of Care Patient          G-Codes - 02-28-15 1357    Functional Assessment Tool Used FOTO   Functional Limitation Mobility: Walking and moving around   Mobility: Walking and Moving Around Current Status [phone]) At least 80 percent but less than 100 percent impaired, limited or restricted   Mobility: Walking and Moving Around Goal Status [phone]) At least 40 percent but less than 60 percent impaired, limited or restricted      Problem List Patient Active Problem List   Diagnosis Date Noted  . Upper respiratory tract infection 11/30/2014  . Acute appendicitis 07/28/2014  . Other malaise and fatigue 03/09/2014  . Unspecified vitamin D deficiency 03/09/2014  . Postpartum care following vaginal delivery (12/18) 10/08/2013  . Normal labor and delivery 10/08/2013  . Migraine with aura, intractable 01/20/2013  . Headache, chronic migraine without aura, intractable 03/18/2012  . Myalgia and myositis 03/18/2012  . Lumbar spondylosis 03/18/2012  . PERSONAL HISTORY OF ALLERGY TO EGGS- GI SENSITIVITY 05/04/2010  . CHANGE IN BOWELS 05/02/2010  . IRRITABLE BOWEL SYNDROME 12/04/2009  . NONSPECIFIC ABN FINDNG RAD&OTH EXAM BILARY TRCT 11/30/2009  . GERD 01/31/2009  . ABDOMINAL PAIN-EPIGASTRIC 01/31/2009  . PERSONAL HX COLONIC POLYPS 12/14/2008  . INTERNAL HEMORRHOIDS 01/03/2007  . HIATAL HERNIA 09/29/2004    Voncille Lo, PT 2015/02/28 2:14 PM Phone: [phone] Fax: [phone]  By signing I understand that I am ordering/authorizing the use of Iontophoresis using 4 mg/mL of dexamethasone  as a component of this plan of care. [place], [place],  [address] Phone: [phone]   Fax:  [phone]

## 2015-02-04 ENCOUNTER — Telehealth: Payer: Self-pay | Admitting: *Deleted

## 2015-02-04 DIAGNOSIS — G43111 Migraine with aura, intractable, with status migrainosus: Secondary | ICD-10-CM

## 2015-02-04 DIAGNOSIS — IMO0001 Reserved for inherently not codable concepts without codable children: Secondary | ICD-10-CM

## 2015-02-04 MED ORDER — HYDROMORPHONE HCL 4 MG PO TABS: ORAL_TABLET | ORAL | Status: DC

## 2015-02-04 NOTE — Telephone Encounter (Signed)
[name] [name] called for permission to fill rx early. I spoke with [name] and informed her this is the only early refill and she must make the rx last 30 days. No further early refills.  Printed Rx to have [name] to sign.She will bring back the one she has (her husband will bring) and get the new Rx due to issues with [facility] filling the old rx.

## 2015-02-08 ENCOUNTER — Other Ambulatory Visit: Payer: Self-pay | Admitting: Obstetrics and Gynecology

## 2015-02-09 LAB — CYTOLOGY - PAP

## 2015-02-15 ENCOUNTER — Ambulatory Visit: Payer: Medicare Other | Admitting: Physical Medicine & Rehabilitation

## 2015-02-22 ENCOUNTER — Ambulatory Visit: Payer: Medicare Other | Admitting: Physical Therapy

## 2015-02-28 ENCOUNTER — Encounter: Payer: Medicare Other | Admitting: Physical Therapy

## 2015-03-01 ENCOUNTER — Ambulatory Visit: Payer: Medicare Other | Attending: Physical Medicine & Rehabilitation | Admitting: Physical Therapy

## 2015-03-01 DIAGNOSIS — M545 Low back pain, unspecified: Secondary | ICD-10-CM

## 2015-03-01 DIAGNOSIS — M546 Pain in thoracic spine: Secondary | ICD-10-CM | POA: Diagnosis not present

## 2015-03-01 DIAGNOSIS — R293 Abnormal posture: Secondary | ICD-10-CM | POA: Diagnosis not present

## 2015-03-01 DIAGNOSIS — M533 Sacrococcygeal disorders, not elsewhere classified: Secondary | ICD-10-CM

## 2015-03-01 DIAGNOSIS — M542 Cervicalgia: Secondary | ICD-10-CM | POA: Insufficient documentation

## 2015-03-01 DIAGNOSIS — R6889 Other general symptoms and signs: Secondary | ICD-10-CM

## 2015-03-01 NOTE — Therapy (Signed)
[place], [place], [address] Phone: [phone]   Fax:  [phone]  Physical Therapy Treatment  Patient Details  Name: [name] [name] MRN: [id number] Date of Birth: [birth year] Referring Provider:  Meredith Staggers, MD  Encounter Date: 03/01/2015      PT End of Session - 03/01/15 1035    Visit Number 4   Number of Visits 20   Date for PT Re-Evaluation 03/17/15   PT Start Time 0938   PT Stop Time 1040   PT Time Calculation (min) 62 min   Activity Tolerance Patient tolerated treatment well;Patient limited by pain  with lumbar flexion   Behavior During Therapy [facility] for tasks assessed/performed      Past Medical History  Diagnosis Date  . Seasonal allergies   . GERD (gastroesophageal reflux disease)   . IBS (irritable bowel syndrome)   . Adenomatous colon polyp 2008  . Constipation   . Sciatica   . Hematochezia   . Internal hemorrhoids   . 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 Complication of anesthesia     "didn't wake up well/remembered stuff from during OR when I had pituitary surgery"  . DVT (deep vein thrombosis) in pregnancy 09/2013    "LLE; postpartum"  . Sleep apnea     "don't currently use CPAP" (07/28/2014)  . Prolactin secreting pituitary adenoma 1999  . H/O hiatal hernia   . Migraines     "couple times/wk" (07/28/2014)  . Arthritis     "jaw joints; neck" (07/28/2014)  . Fibromyalgia   . Depression     hx  . Anxiety 1999/2000  . Panic disorder 1999/2000    Past Surgical History  Procedure Laterality Date  . Transphenoidal / transnasal hypophysectomy / resection pituitary tumor  1999    resection of benign pituitary tumor, [facility]  . Colonoscopy  01/03/2007    Dr. Silvano Rusk  . Esophagogastroduodenoscopy  09/29/2004    Dr. Kennedy Bucker  . Uterine fibroid surgery  2008  . Appendectomy  07/28/2014  . Laparoscopic appendectomy N/A 07/28/2014    Procedure:  APPENDECTOMY LAPAROSCOPIC;  Surgeon: Autumn Messing III, MD;  Location: [facility];  Service: General;  Laterality: N/A;    There were no vitals filed for this visit.  Visit Diagnosis:  Neck pain  Bilateral thoracic back pain  Right-sided low back pain without sciatica  Abnormal posture  SI (sacroiliac) pain  Activity intolerance      Subjective Assessment - 03/01/15 0938    Subjective I am not any worse with pain.  i dont' have any major headaches. i have been doing hip strengthening and swimming.  Really swimming is the best exercise and the most pain free exercise I can do.  iI have a Serola belt for SI that I need to find.  My thoracic pain is better.  My daughter has had a >105 fever and I have been caring and carrying her these past days. Pt reports  Dr. Lorin Mercy gave cortisone injection to the Right hip with fair results.     How long can you sit comfortably? 15 min   How long can you stand comfortably? 15 min to 20 minutes   Currently in Pain? Yes   Pain Score 7    Pain Location Back   Pain Orientation Mid   Pain Descriptors / Indicators Aching;Burning   Pain Type Chronic pain   Multiple Pain Sites Yes   Pain Score  7   Pain Location Sacrum   Pain Orientation Right  especially when lifting Right leg   Pain Score 4   Pain Location Back   Pain Orientation Right   Pain Descriptors / Indicators Tightness;Sore   Pain Onset More than a month ago   Pain Frequency Intermittent            [facility] PT Assessment - 03/01/15 1011    Posture/Postural Control   Posture/Postural Control Postural limitations   Postural Limitations Rounded Shoulders;Forward head;Increased lumbar lordosis   AROM   Cervical Flexion 45  pain, pulling   Cervical Extension 42  ERP   Cervical - Right Side Bend 32  pain on left   Cervical - Left Side Bend 45  ERP   Cervical - Right Rotation 50   Cervical - Left Rotation 55   Lumbar Flexion 35  with pain and limitation due to caring for baby   Lumbar  Extension 15  pain in low back   Lumbar - Right Side Bend 20  pain   Lumbar - Left Side Bend 25                     [facility] Adult PT Treatment/Exercise - 03/01/15 1011    Posture/Postural Control   Posture Comments R hip elevated and painful to palpation over Quadratus lumborum. Pt reviewed and able to verbalize proper body mechanics and posture for tasks    Lumbar Exercises: Aerobic   Elliptical 5 min  level 2   Moist Heat Therapy   Number Minutes Moist Heat 15 Minutes   Moist Heat Location --  neck and upper back   Manual Therapy   Joint Mobilization T-8 to T-12 PA mobs Grade 3, Sacral scrubbing and PA grade 3/4  on R SI , C-3 to C-7 lateral PA from right    Myofascial Release Quadratus Lumborum MFR  of Right in left sidelying, Right upper trap and levator DTW          Trigger Point Dry Needling - 03/01/15 0957    Consent Given? Yes   Education Handout Provided No  previously   Muscles Treated Upper Body Upper trapezius;Levator scapulae;Suboccipitals muscle group   Upper Trapezius Response Twitch reponse elicited;Palpable increased muscle length  right only   SubOccipitals Response Twitch response elicited;Palpable increased muscle length  R only   Levator Scapulae Response Twitch response elicited;Palpable increased muscle length  Right only   Gluteus Maximus Response Twitch response elicited;Palpable increased muscle length  Quadratus R twitch response and palpable muscle lengthening   Gluteus Minimus Response Twitch response elicited;Palpable increased muscle length  Right only   Piriformis Response Twitch response elicited;Palpable increased muscle length  Right only              PT Education - 03/01/15 0944    Education provided Yes   Education Details Pt reviewed /able to verbalize posture/body mechanics and caring for children.  Use of SI belt especially while moving out of house and carring for children.  Also, verbalized clarification for  stretching.   Person(s) Educated Patient   Methods Explanation;Demonstration;Verbal cues   Comprehension Verbalized understanding;Returned demonstration             PT Long Term Goals - 03/01/15 1044    PT LONG TERM GOAL #1   Title independent with HEP and verbalize understanding for exercise progression and self mobilization (03-17-15)   Time 12   Period Weeks   Status On-going  PT LONG TERM GOAL #2   Title report pain < 5/10 with exercise (03-17-15)  Pt 7/10 in back and 5/10 after treatment   Time 12   Period Weeks   Status On-going   PT LONG TERM GOAL #3   Title verbalize understanding of posture/body mechanics to reduce risk of reinjury (03-17-15)   Time 12   Period Weeks   Status Achieved   PT LONG TERM GOAL #4   Title improve limited cervical and lumbar ROM by 5 degrees for improved function (03-17-15)   Time 12   Period Weeks   Status Achieved   PT LONG TERM GOAL #5   Title Pt FOTO will improve from 84% limitation to 45% indicating improved functional mobility   Time 12   Period Weeks   Status On-going               Plan - 03/01/15 1036    Clinical Impression Statement Pt not seen since 02-02-18.  Pt has seen Dr. Rodell Perna for R hip cortisone injection with fair results according to patient.  Pt has been caring for sick child with high fever and carrying her 33 month old.  Pt with decreased AROM in lumbar flexion this session.  Pt with increased AROM of cervical and no headaches today.  Main complaint is lumbar Right pain at SI junciton.  Pt with elevated Quadratus Lumborum on Right.  Pt  is doing  water aerobics and experiences no pain.  Pt was encouraged to use her Serola SI  when moving and caring for children.  Pt is also moving houses now and not able to carry out  proper body mechanics though she is aware of proper form.  Pt  will continue with dry needling and core strengthening as able until D/C on or before 03-17-15   Pt will benefit from skilled  therapeutic intervention in order to improve on the following deficits Impaired flexibility;Pain;Improper body mechanics;Decreased range of motion;Decreased strength;Increased muscle spasms;Postural dysfunction;Decreased activity tolerance   PT Frequency 2x / week   PT Duration 12 weeks   PT Treatment/Interventions ADLs/Self Care Home Management;Electrical Stimulation;Gait training;Therapeutic exercise;Patient/family education;Moist Heat;Functional mobility training;Neuromuscular re-education;Manual techniques;Passive range of motion;Therapeutic activities;Ultrasound   PT Next Visit Plan continue Dry needling and assess. continue Spinal stabillization asses use of Serola belt.  Assess Lumbar AROM/flexion. Do FOTO next visit   Consulted and Agree with Plan of Care Patient        Problem List Patient Active Problem List   Diagnosis Date Noted  . Upper respiratory tract infection 11/30/2014  . Acute appendicitis 07/28/2014  . Other malaise and fatigue 03/09/2014  . Unspecified vitamin D deficiency 03/09/2014  . Postpartum care following vaginal delivery (12/18) 10/08/2013  . Normal labor and delivery 10/08/2013  . Migraine with aura, intractable 01/20/2013  . Headache, chronic migraine without aura, intractable 03/18/2012  . Myalgia and myositis 03/18/2012  . Lumbar spondylosis 03/18/2012  . PERSONAL HISTORY OF ALLERGY TO EGGS- GI SENSITIVITY 05/04/2010  . CHANGE IN BOWELS 05/02/2010  . IRRITABLE BOWEL SYNDROME 12/04/2009  . NONSPECIFIC ABN FINDNG RAD&OTH EXAM BILARY TRCT 11/30/2009  . GERD 01/31/2009  . ABDOMINAL PAIN-EPIGASTRIC 01/31/2009  . PERSONAL HX COLONIC POLYPS 12/14/2008  . INTERNAL HEMORRHOIDS 01/03/2007  . HIATAL HERNIA 09/29/2004   Voncille Lo, PT 03/01/2015 10:52 AM Phone: [phone] Fax: [facility] [place], [place], [address] Phone: [phone]   Fax:  [phone]

## 2015-03-08 ENCOUNTER — Ambulatory Visit: Payer: Medicare Other | Admitting: Physical Therapy

## 2015-03-08 ENCOUNTER — Encounter: Payer: Self-pay | Admitting: Physical Medicine & Rehabilitation

## 2015-03-08 ENCOUNTER — Encounter: Payer: Medicare Other | Attending: Registered Nurse | Admitting: Physical Medicine & Rehabilitation

## 2015-03-08 VITALS — BP 115/79 | HR 95 | Resp 14

## 2015-03-08 DIAGNOSIS — G43111 Migraine with aura, intractable, with status migrainosus: Secondary | ICD-10-CM | POA: Insufficient documentation

## 2015-03-08 DIAGNOSIS — G894 Chronic pain syndrome: Secondary | ICD-10-CM | POA: Diagnosis present

## 2015-03-08 DIAGNOSIS — M609 Myositis, unspecified: Secondary | ICD-10-CM | POA: Insufficient documentation

## 2015-03-08 DIAGNOSIS — Z79899 Other long term (current) drug therapy: Secondary | ICD-10-CM | POA: Diagnosis present

## 2015-03-08 DIAGNOSIS — Z5181 Encounter for therapeutic drug level monitoring: Secondary | ICD-10-CM | POA: Insufficient documentation

## 2015-03-08 DIAGNOSIS — F411 Generalized anxiety disorder: Secondary | ICD-10-CM | POA: Insufficient documentation

## 2015-03-08 DIAGNOSIS — IMO0001 Reserved for inherently not codable concepts without codable children: Secondary | ICD-10-CM

## 2015-03-08 DIAGNOSIS — M791 Myalgia: Secondary | ICD-10-CM | POA: Diagnosis not present

## 2015-03-08 MED ORDER — HYDROMORPHONE HCL 4 MG PO TABS: ORAL_TABLET | ORAL | Status: DC

## 2015-03-08 MED ORDER — ALPRAZOLAM 0.25 MG PO TABS: 0.2500 mg | ORAL_TABLET | Freq: Two times a day (BID) | ORAL | Status: DC | PRN

## 2015-03-08 NOTE — Patient Instructions (Signed)
PLEASE CALL ME WITH ANY PROBLEMS OR QUESTIONS (#[phone]).      

## 2015-03-08 NOTE — Progress Notes (Signed)
Subjective:    Patient ID: [name] [name], female    DOB: 1976-09-29, 39 y.o.   MRN: [id number]  HPI   Kaylean is here in follow up of her chronic pain. I performed botox for migraine in March and she had really good results with this.   She had a visit with ortho regarding back pain. Conservative recommendations were made.  MRI report from [facility] revealed mild lumbar facet disease. She is having pain with walking/standing. Right hip might bother her a bit more than left.   She has struggled with more pain due increased physical and psychosocial stresses at home.  She has also had more pain since her appendectomy when her activity levels decreased.  She wants to know if there is something stronger than dilaudid she can take.   Pain Inventory Average Pain 7 Pain Right Now 9 My pain is constant, sharp, dull, stabbing and aching  In the last 24 hours, has pain interfered with the following? General activity 9 Relation with others 8 Enjoyment of life 7 What TIME of day is your pain at its worst? morning, daytime, evening Sleep (in general) Poor  Pain is worse with: walking, bending, sitting, inactivity, standing and some activites Pain improves with: rest, heat/ice, therapy/exercise, medication, TENS and injections Relief from Meds: 6  Mobility Do you have any goals in this area?  no  Function Do you have any goals in this area?  no  Neuro/Psych anxiety  Prior Studies Any changes since last visit?  no  Physicians involved in your care Any changes since last visit?  no   Family History  Problem Relation Age of Onset  . Heart disease Mother   . Diabetes Mother   . Heart disease Father   . Prostate cancer Father   . Heart disease    . Prostate cancer    . Diabetes    . Colon cancer Maternal Grandmother    History   Social History  . Marital Status: Married    Spouse Name: N/A  . Number of Children: N/A  . Years of Education: N/A   Social History  Main Topics  . Smoking status: Never Smoker   . Smokeless tobacco: Never Used  . Alcohol Use: 0.6 oz/week    1 Glasses of wine per week  . Drug Use: No  . Sexual Activity: Yes   Other Topics Concern  . None   Social History Narrative   Past Surgical History  Procedure Laterality Date  . Transphenoidal / transnasal hypophysectomy / resection pituitary tumor  1999    resection of benign pituitary tumor, [facility]  . Colonoscopy  01/03/2007    Dr. Silvano Rusk  . Esophagogastroduodenoscopy  09/29/2004    Dr. Kennedy Bucker  . Uterine fibroid surgery  2008  . Appendectomy  07/28/2014  . Laparoscopic appendectomy N/A 07/28/2014    Procedure: APPENDECTOMY LAPAROSCOPIC;  Surgeon: Autumn Messing III, MD;  Location: [facility];  Service: General;  Laterality: N/A;   Past Medical History  Diagnosis Date  . Seasonal allergies   . GERD (gastroesophageal reflux disease)   . IBS (irritable bowel syndrome)   . Adenomatous colon polyp 2008  . Constipation   . Sciatica   . Hematochezia   . Internal hemorrhoids   . 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 Complication of anesthesia     "didn't wake up well/remembered stuff from during OR when I had pituitary surgery"  . DVT (deep  vein thrombosis) in pregnancy 09/2013    "LLE; postpartum"  . Sleep apnea     "don't currently use CPAP" (07/28/2014)  . Prolactin secreting pituitary adenoma 1999  . H/O hiatal hernia   . Migraines     "couple times/wk" (07/28/2014)  . Arthritis     "jaw joints; neck" (07/28/2014)  . Fibromyalgia   . Depression     hx  . Anxiety 1999/2000  . Panic disorder 1999/2000   BP 115/79 mmHg  Pulse 95  Resp 14  SpO2 98%  Opioid Risk Score:   Fall Risk Score: Low Fall Risk (0-5 points)`1  Depression screen PHQ 2/9  Depression screen PHQ 2/9 01/12/2015  Decreased Interest 0  Down, Depressed, Hopeless 0  PHQ - 2 Score 0  Altered sleeping 1  Tired, decreased energy 1  Change in appetite 0  Feeling bad or  failure about yourself  0  Trouble concentrating 1  Moving slowly or fidgety/restless 0  Suicidal thoughts 0  PHQ-9 Score 3     Review of Systems  Gastrointestinal: Positive for constipation.  Psychiatric/Behavioral: The patient is nervous/anxious.   All other systems reviewed and are negative.      Objective:   Physical Exam  Constitutional: She is oriented to person, place, and time. She appears well-developed and well-nourished.  HENT:  Head: Normocephalic and atraumatic.  Eyes: Pupils are equal, round, and reactive to light.  Cardiovascular: Normal rate and regular rhythm.  Pulmonary/Chest: Effort normal and breath sounds normal. No respiratory distress. She has no wheezes.  Abdominal: Soft. Small abdominal scar near umbilicus Musculoskeletal: Mild levoscoliosis of the back with apex around T12. Mild limp to the right. Back tender in extension more than flexion Neurological: She is alert and oriented to person, place, and time. No cranial nerve deficit. Coordination normal.  Psychiatric: She has a normal mood and affect. Her behavior is normal. Judgment and thought content normal. Good spirits.  Assessment & Plan:    ASSESSMENT:  1. Migraine headaches.  2. Fibromyalgia syndrome.  3. Depression.  4. Chronic low back pain and cervicalgia. The patient with myofascial  and spondylosis components.    PLAN:  1. Made a referral to outpt therapies at [facility] to address core muscle strengthenging, modalities--HEP focusing on facet based exercises/pilates.  2. Botox for migraine, 200u next monthagain 3. Continue with nasal imitrex for breakthrough migraines 20 mg daily p.r.n.  4. Dilaudid for severe breakthrough pain, 4-6mg  qd prn #50---goals again is to taper in the big picture.  5. DHEA is still an option for her given hormonally mediated headaches and fibro symptoms. Recommend continuing her omega 3 also  6. I will see her back pending injections above- next  month.

## 2015-03-15 ENCOUNTER — Ambulatory Visit: Payer: Medicare Other | Admitting: Physical Medicine & Rehabilitation

## 2015-03-15 ENCOUNTER — Ambulatory Visit: Payer: Medicare Other | Admitting: Physical Therapy

## 2015-03-15 DIAGNOSIS — M542 Cervicalgia: Secondary | ICD-10-CM | POA: Diagnosis not present

## 2015-03-15 DIAGNOSIS — M545 Low back pain, unspecified: Secondary | ICD-10-CM

## 2015-03-15 DIAGNOSIS — M533 Sacrococcygeal disorders, not elsewhere classified: Secondary | ICD-10-CM

## 2015-03-15 DIAGNOSIS — M546 Pain in thoracic spine: Secondary | ICD-10-CM

## 2015-03-15 DIAGNOSIS — R293 Abnormal posture: Secondary | ICD-10-CM

## 2015-03-15 DIAGNOSIS — R6889 Other general symptoms and signs: Secondary | ICD-10-CM

## 2015-03-15 NOTE — Therapy (Signed)
[facility] [place], [place], [address] Phone: [phone]   Fax:  [phone]  Physical Therapy Treatment/ Discharge Note  Patient Details  Name: [name] [name] MRN: [id number] Date of Birth: Jun 02, 1976 Referring Provider:  Meredith Staggers, MD  Encounter Date: 03/15/2015      PT End of Session - 03/15/15 0932    Visit Number 5   Number of Visits 20   Date for PT Re-Evaluation 03/17/15   PT Start Time 0932   PT Stop Time 1029   PT Time Calculation (min) 57 min   Activity Tolerance Patient tolerated treatment well   Behavior During Therapy [employer] for tasks assessed/performed      Past Medical History  Diagnosis Date  . Seasonal allergies   . GERD (gastroesophageal reflux disease)   . IBS (irritable bowel syndrome)   . Adenomatous colon polyp 2008  . Constipation   . Sciatica   . Hematochezia   . Internal hemorrhoids   . 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 Complication of anesthesia     "didn't wake up well/remembered stuff from during OR when I had pituitary surgery"  . DVT (deep vein thrombosis) in pregnancy 09/2013    "LLE; postpartum"  . Sleep apnea     "don't currently use CPAP" (07/28/2014)  . Prolactin secreting pituitary adenoma 1999  . H/O hiatal hernia   . Migraines     "couple times/wk" (07/28/2014)  . Arthritis     "jaw joints; neck" (07/28/2014)  . Fibromyalgia   . Depression     hx  . Anxiety 1999/2000  . Panic disorder 1999/2000    Past Surgical History  Procedure Laterality Date  . Transphenoidal / transnasal hypophysectomy / resection pituitary tumor  1999    resection of benign pituitary tumor, [facility]  . Colonoscopy  01/03/2007    Dr. Silvano Rusk  . Esophagogastroduodenoscopy  09/29/2004    Dr. Kennedy Bucker  . Uterine fibroid surgery  2008  . Appendectomy  07/28/2014  . Laparoscopic appendectomy N/A 07/28/2014    Procedure: APPENDECTOMY LAPAROSCOPIC;   Surgeon: Autumn Messing III, MD;  Location: [facility];  Service: General;  Laterality: N/A;    There were no vitals filed for this visit.  Visit Diagnosis:  Neck pain  Bilateral thoracic back pain  Right-sided low back pain without sciatica  Abnormal posture  SI (sacroiliac) pain  Activity intolerance      Subjective Assessment - 03/15/15 0933    Subjective I am walking better now.  I just sneezed in the parking lot and it caused pain in my sacrum.  I am not bending over and having as much pain picking up my baby ( 18 months).  I am walking about 20 minutes. using Serola belt most of time.  I vacuumed for the first time last week.   Limitations Sitting;House hold activities   Pain Score 5    Pain Location Back   Pain Orientation Mid  sacral   Pain Descriptors / Indicators Aching;Burning   Pain Type Chronic pain   Pain Onset More than a month ago   Pain Score 5   Pain Location Sacrum   Pain Orientation Right   Pain Descriptors / Indicators Aching            [facility] PT Assessment - 03/15/15 1010    Observation/Other Assessments   Focus on Therapeutic Outcomes (FOTO)  intake 50% predicted limitation 45% D/C 50%  AROM   Cervical Flexion 50   Cervical Extension 60   Cervical - Right Side Bend 45   Cervical - Left Side Bend 45  end range tightness   Cervical - Right Rotation 50   Cervical - Left Rotation 55   Lumbar Flexion 60  slight catching at End range   Lumbar Extension 20  pain in low back   Lumbar - Right Side Bend 20   Lumbar - Left Side Bend 25   Strength   Right Hip Flexion 4-/5   Right Hip Extension 3+/5   Right Hip ABduction 4-/5   Left Hip Flexion 4+/5   Left Hip Extension 4/5   Left Hip ABduction 4/5   Right Knee Flexion 4+/5   Right Knee Extension 5/5   Left Knee Flexion 5/5   Left Knee Extension 5/5                     [facility] Adult PT Treatment/Exercise - 03/15/15 0932    Lumbar Exercises: Supine   Ab Set 10 reps  10 sec hold with VC  and TC by PT bilaterally one at a time   Bent Knee Raise 10 reps;3 seconds  VC  bilaterally one leg at time with good motor control   Other Supine Lumbar Exercises reviewed exercises from HEP /questions answered   Manual Therapy   Joint Mobilization , Sacral scrubbing and PA grade 3/4  on R SI , C-3 to C-7 lateral PA from right    Myofascial Release Quadratus Lumborum MFR  of left in Right sidelying, Right upper trap and levator DTW          Trigger Point Dry Needling - 03/15/15 0943    Consent Given? Yes   Education Handout Provided No  previously given   Muscles Treated Upper Body --  left Quadratus lumborum   Upper Trapezius Response Twitch reponse elicited;Palpable increased muscle length   SubOccipitals Response Twitch response elicited;Palpable increased muscle length   Levator Scapulae Response Twitch response elicited;Palpable increased muscle length   Gluteus Maximus Response Twitch response elicited;Palpable increased muscle length   Gluteus Minimus Response Twitch response elicited;Palpable increased muscle length   Piriformis Response Twitch response elicited;Palpable increased muscle length              PT Education - 03/15/15 1323    Education provided Yes   Education Details Pt reviewed HEP and discussed home strategies for lifting child/body mechanics.  community exercise in [employer]) Educated Patient   Methods Explanation;Demonstration;Tactile cues   Comprehension Verbalized understanding             PT Long Term Goals - 03/15/15 0936    PT LONG TERM GOAL #1   Title independent with HEP and verbalize understanding for exercise progression and self mobilization (03-17-15)   Time 12   Period Weeks   Status Achieved   PT LONG TERM GOAL #2   Title report pain < 5/10 with exercise (03-17-15)   Baseline Pt now 5/10 better than 7/10 last visit   Time 12   Period Weeks   Status Achieved   PT LONG TERM GOAL #3   Title verbalize  understanding of posture/body mechanics to reduce risk of reinjury (03-17-15)   Baseline verbalizes/demo how to pick up 33 month old,  also using straight plane movement and avoiding twisting   Time 12   Period Weeks   Status Achieved   PT LONG TERM GOAL #4  Title improve limited cervical and lumbar ROM by 5 degrees for improved function (03-17-15)   Time 12   Period Weeks   Status Achieved   PT LONG TERM GOAL #5   Title Pt FOTO will improve from 84% limitation to 45% indicating improved functional mobility  Pt improved to 50%    Time 12   Period Weeks   Status Partially Met               Plan - 04-03-15 1324    Clinical Impression Statement Pt walking better in clinic. Pt had just sneezed in car and exacerbated Right sacrum pain and yet was now able to walk with normalized gait.   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  Pt  is aware that she needed to perform core strength and does not need to concentrate on overstretching.   Pt  has achieved all goals or partially achieved goals. Pt  has WNL AROM of lumbar except for lumbar extension 20 deegrees with endrange pain.  Pt  with improved MMT in LE's from evaluation. FOTO improved eval 69%, 4/14 84 % limitatation improved to 50% at discharge.  Pt is independent with HEP for strength and self mobilization.   Pt will benefit from skilled therapeutic intervention in order to improve on the following deficits Impaired flexibility;Pain;Improper body mechanics;Decreased range of motion;Decreased strength;Increased muscle spasms;Postural dysfunction;Decreased activity tolerance   Rehab Potential Good   PT Treatment/Interventions ADLs/Self Care Home Management;Electrical Stimulation;Gait training;Therapeutic exercise;Patient/family education;Moist Heat;Functional mobility training;Neuromuscular re-education;Manual techniques;Passive range of motion;Therapeutic activities;Ultrasound   PT Next Visit  Plan D C since most goals achieved   Consulted and Agree with Plan of Care Patient          G-Codes - 04-03-2015 1330    Functional Assessment Tool Used FOTO   Functional Limitation Mobility: Walking and moving around   Mobility: Walking and Moving Around Goal Status [phone]) At least 40 percent but less than 60 percent impaired, limited or restricted  45%   Mobility: Walking and Moving Around Discharge Status [phone]) At least 40 percent but less than 60 percent impaired, limited or restricted  50%      Problem List Patient Active Problem List   Diagnosis Date Noted  . Anxiety state 03/08/2015  . Upper respiratory tract infection 11/30/2014  . Acute appendicitis 07/28/2014  . Other malaise and fatigue 03/09/2014  . Unspecified vitamin D deficiency 03/09/2014  . Postpartum care following vaginal delivery (12/18) 10/08/2013  . Normal labor and delivery 10/08/2013  . Migraine with aura, intractable 01/20/2013  . Headache, chronic migraine without aura, intractable 03/18/2012  . Myalgia and myositis 03/18/2012  . Lumbar spondylosis 03/18/2012  . PERSONAL HISTORY OF ALLERGY TO EGGS- GI SENSITIVITY 05/04/2010  . CHANGE IN BOWELS 05/02/2010  . IRRITABLE BOWEL SYNDROME 12/04/2009  . NONSPECIFIC ABN FINDNG RAD&OTH EXAM BILARY TRCT 11/30/2009  . GERD 01/31/2009  . ABDOMINAL PAIN-EPIGASTRIC 01/31/2009  . PERSONAL HX COLONIC POLYPS 12/14/2008  . INTERNAL HEMORRHOIDS 01/03/2007  . HIATAL HERNIA 09/29/2004    Voncille Lo, PT 04-03-15 1:45 PM Phone: [phone] Fax: [facility] [address] [place], [place], [address] Phone: [phone]   Fax:  [phone]   PHYSICAL THERAPY DISCHARGE SUMMARY  Visits from Start of Care: 5  Current functional level related to goals / functional outcomes: See Goals above.  All achieved or partially achieved   Remaining deficits: Pt with 5/10 pain reduced  from 9-10/10,   Pt AROM of lumbar and cervical within normal limits but extension with end range pain.   Education / Equipment: HEP and pain management strategies at home .  Pt will continue with aquatic therapy and strengthening with yoga and limit overstretching Plan: Patient agrees to discharge.  Patient goals were partially met. Patient is being discharged due to meeting the stated rehab goals.  ????? mostly achieveing stated rehab goals.         Voncille Lo, PT 03/15/2015 1:45 PM Phone: [phone] Fax: [phone]

## 2015-03-24 ENCOUNTER — Ambulatory Visit: Payer: Medicare Other | Admitting: Physical Therapy

## 2015-03-31 ENCOUNTER — Telehealth: Payer: Self-pay | Admitting: *Deleted

## 2015-03-31 NOTE — Telephone Encounter (Signed)
[name] [name] called requesting a refill on her medication.  She is going out of town.  Her last fill was 03/10/15 and today is 03/31/15.  This is 10 days early.  She says she is going out of town but because of frequent request for early fills she has been warned on 02/04/15 that no further early refills will be given.  She has also received hydrocodone from two other prescribers which is a violation of our contract, last one from her gynecologist office. I have notified her that we can not address refill until Dr [name] is back in office Monday.  I did not tell her that we are aware of the other prescribers.

## 2015-04-04 ENCOUNTER — Telehealth: Payer: Self-pay | Admitting: *Deleted

## 2015-04-04 NOTE — Telephone Encounter (Signed)
Patient is calling again for a refill on her Dilaudid 4mg . Please advise

## 2015-04-05 NOTE — Telephone Encounter (Signed)
Spoke with Dr. [name] - may refill patients Dilaudid 4 mg tablets 1.5 to 2 tablets as needed for severe pain.  Print out RX for Wednesday 04/06/15 and will have doctor sign.

## 2015-04-06 ENCOUNTER — Other Ambulatory Visit: Payer: Self-pay | Admitting: *Deleted

## 2015-04-06 DIAGNOSIS — IMO0001 Reserved for inherently not codable concepts without codable children: Secondary | ICD-10-CM

## 2015-04-06 DIAGNOSIS — G43111 Migraine with aura, intractable, with status migrainosus: Secondary | ICD-10-CM

## 2015-04-06 MED ORDER — HYDROMORPHONE HCL 4 MG PO TABS: ORAL_TABLET | ORAL | Status: DC

## 2015-04-06 NOTE — Telephone Encounter (Signed)
I called and spoke with the patient regarding her hydrocodone prescriptions from 2 other providers as reported on the [facility]. The patient stated she was given those prescriptions when she went to Ortho. The first RX was given by Ortho, the second however was given at [facility] center in [place]. The patient stated the [place] hurt her stomach, so she stopped taking it. Then she explained the second RX was given to her to try it again because she didn't know if the [place] hurt her stomach or if it was just the 4 hour car ride. This information was given to Dr. [name] along with the [facility].

## 2015-04-06 NOTE — Telephone Encounter (Signed)
Printed out RX for Doctor to sign.

## 2015-04-07 ENCOUNTER — Telehealth: Payer: Self-pay | Admitting: Psychology

## 2015-04-07 NOTE — Telephone Encounter (Signed)
On 03/29/15 I called [name] [name] to schedule an appointment per referral from Dr. [name]. Answered questions about services I offer. She stated she was out of town but would call me back to schedule.

## 2015-04-07 NOTE — Telephone Encounter (Signed)
Printed out RX and gave it Johnette - she is to call the patient

## 2015-04-21 ENCOUNTER — Telehealth: Payer: Self-pay | Admitting: *Deleted

## 2015-04-21 NOTE — Telephone Encounter (Signed)
Pt is going to run out of meds July 18th. She has tried to schedule with you or Zella Ball. There are no appt slots available.  She is asking what we can do to help her

## 2015-05-04 ENCOUNTER — Other Ambulatory Visit: Payer: Self-pay | Admitting: Physical Medicine & Rehabilitation

## 2015-05-04 ENCOUNTER — Encounter: Payer: Self-pay | Admitting: Physical Medicine & Rehabilitation

## 2015-05-04 ENCOUNTER — Encounter: Payer: Medicare Other | Attending: Registered Nurse | Admitting: Physical Medicine & Rehabilitation

## 2015-05-04 VITALS — BP 128/72 | HR 68 | Resp 14

## 2015-05-04 DIAGNOSIS — Z5181 Encounter for therapeutic drug level monitoring: Secondary | ICD-10-CM | POA: Diagnosis present

## 2015-05-04 DIAGNOSIS — G43119 Migraine with aura, intractable, without status migrainosus: Secondary | ICD-10-CM | POA: Diagnosis not present

## 2015-05-04 DIAGNOSIS — IMO0001 Reserved for inherently not codable concepts without codable children: Secondary | ICD-10-CM

## 2015-05-04 DIAGNOSIS — M609 Myositis, unspecified: Secondary | ICD-10-CM | POA: Insufficient documentation

## 2015-05-04 DIAGNOSIS — F909 Attention-deficit hyperactivity disorder, unspecified type: Secondary | ICD-10-CM | POA: Insufficient documentation

## 2015-05-04 DIAGNOSIS — M791 Myalgia: Secondary | ICD-10-CM | POA: Insufficient documentation

## 2015-05-04 DIAGNOSIS — Z79899 Other long term (current) drug therapy: Secondary | ICD-10-CM | POA: Insufficient documentation

## 2015-05-04 DIAGNOSIS — G894 Chronic pain syndrome: Secondary | ICD-10-CM | POA: Insufficient documentation

## 2015-05-04 DIAGNOSIS — M47816 Spondylosis without myelopathy or radiculopathy, lumbar region: Secondary | ICD-10-CM

## 2015-05-04 DIAGNOSIS — G43111 Migraine with aura, intractable, with status migrainosus: Secondary | ICD-10-CM | POA: Diagnosis present

## 2015-05-04 DIAGNOSIS — F900 Attention-deficit hyperactivity disorder, predominantly inattentive type: Secondary | ICD-10-CM

## 2015-05-04 MED ORDER — HYDROMORPHONE HCL 4 MG PO TABS
ORAL_TABLET | ORAL | Status: DC
Start: 2015-05-04 — End: 2015-06-02

## 2015-05-04 MED ORDER — METHYLPHENIDATE HCL 5 MG PO TABS: 5.0000 mg | ORAL_TABLET | Freq: Two times a day (BID) | ORAL | Status: DC

## 2015-05-04 NOTE — Progress Notes (Signed)
Subjective:    Patient ID: Lionel December, female    DOB: 14-Aug-1976, 39 y.o.   MRN: [id number]  HPI  Amyla is here in follow up of her chronic pain. She states that her headaches have been worse over the last month as there has been a lot of stress at home, mostly surrounding their family move. She finds that her ADHD symptoms are worsening which subsequently worsens her headaches.   She is using up her dilaudid faster than it's due as well as a result. The imitrex injectable doesn't always work, and she feels that if she doesn't take it in time, it makes her pain worse.     Pain Inventory Average Pain 6 Pain Right Now 6 My pain is constant, dull, stabbing and aching  In the last 24 hours, has pain interfered with the following? General activity 4 Relation with others 4 Enjoyment of life 4 What TIME of day is your pain at its worst? morning and evening Sleep (in general) Fair  Pain is worse with: bending, sitting, inactivity and some activites Pain improves with: rest, heat/ice, therapy/exercise, medication and injections Relief from Meds: 7  Mobility walk without assistance how many minutes can you walk? 30 ability to climb steps?  yes do you drive?  yes  Function disabled: date disabled .  Neuro/Psych anxiety  Prior Studies Any changes since last visit?  no  Physicians involved in your care Any changes since last visit?  no   Family History  Problem Relation Age of Onset  . Heart disease Mother   . Diabetes Mother   . Heart disease Father   . Prostate cancer Father   . Heart disease    . Prostate cancer    . Diabetes    . Colon cancer Maternal Grandmother    History   Social History  . Marital Status: Married    Spouse Name: N/A  . Number of Children: N/A  . Years of Education: N/A   Social History Main Topics  . Smoking status: Never Smoker   . Smokeless tobacco: Never Used  . Alcohol Use: 0.6 oz/week    1 Glasses of wine per week  . Drug  Use: No  . Sexual Activity: Yes   Other Topics Concern  . None   Social History Narrative   Past Surgical History  Procedure Laterality Date  . Transphenoidal / transnasal hypophysectomy / resection pituitary tumor  1999    [facility]section of benign pituitary [facility], [facility]  . Colonoscopy  01/03/2007    Dr. Silvano Rusk  . Esophagogastroduodenoscopy  09/29/2004    Dr. Kennedy Bucker  . Uterine fibroid surgery  2008  . Appendectomy  07/28/2014  . Laparoscopic appendectomy N/A 07/28/2014    Procedure: APPENDECTOMY LAPAROSCOPIC;  Surgeon: Autumn Messing III, MD;  Location: [facility];  Service: General;  Laterality: N/A;   Past Medical History  Diagnosis Date  . Seasonal allergies   . GERD (gastroesophageal reflux disease)   . IBS (irritable bowel syndrome)   . Adenomatous colon polyp 2008  . Constipation   . Sciatica   . Hematochezia   . Internal hemorrhoids   . 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 Complication of anesthesia     "didn't wake up well/remembered stuff from during OR when I had pituitary surgery"  . DVT (deep vein thrombosis) in pregnancy 09/2013    "LLE; postpartum"  . Sleep apnea     "don't currently use CPAP" (  07/28/2014)  . Prolactin secreting pituitary adenoma 1999  . H/O hiatal hernia   . Migraines     "couple times/wk" (07/28/2014)  . Arthritis     "jaw joints; neck" (07/28/2014)  . Fibromyalgia   . Depression     hx  . Anxiety 1999/2000  . Panic disorder 1999/2000   BP 128/72 mmHg  Pulse 68  Resp 14  SpO2 99%  Opioid Risk Score:   Fall Risk Score: Low Fall Risk (0-5 points)`1  Depression screen PHQ 2/9  Depression screen PHQ 2/9 01/12/2015  Decreased Interest 0  Down, Depressed, Hopeless 0  PHQ - 2 Score 0  Altered sleeping 1  Tired, decreased energy 1  Change in appetite 0  Feeling bad or failure about yourself  0  Trouble concentrating 1  Moving slowly or fidgety/restless 0  Suicidal thoughts 0  PHQ-9 Score 3    Review of Systems   Constitutional: Negative.   Eyes: Negative.   Respiratory: Negative.   Cardiovascular: Negative.   Gastrointestinal: Negative.   Endocrine: Negative.   Genitourinary:       Painful urination  Musculoskeletal: Positive for myalgias, back pain and arthralgias.  Skin: Negative.   Allergic/Immunologic: Negative.   Neurological: Positive for headaches.  Hematological: Negative.   Psychiatric/Behavioral: The patient is nervous/anxious.        Objective:   Physical Exam  Constitutional: She is oriented to person, place, and time. She appears well-developed and well-nourished. Room is darkened because of her headache HENT:  Head: Normocephalic and atraumatic.  Eyes: Pupils are equal, round, and reactive to light.  Cardiovascular: Normal rate and regular rhythm.  Pulmonary/Chest: Effort normal and breath sounds normal. No respiratory distress. She has no wheezes.  Abdominal: Soft. Small abdominal scar near umbilicus Musculoskeletal: Mild levoscoliosis of the thoracic spine. Mild limp to the right. Back tender in extension more than flexion Neurological: She is alert and oriented to person, place, and time. No cranial nerve deficit. Coordination normal.  Psychiatric: She has a normal mood and affect. Her behavior is normal. Judgment and thought content normal. Good spirits.     Assessment & Plan:   ASSESSMENT:  1. Migraine headaches.  2. Fibromyalgia syndrome.  3. Depression.  4. Chronic low back pain and cervicalgia. The patient with myofascial  and spondylosis components.  6. Chronic ADHD recently worsened by psychosocial stressors.   PLAN:  1. Made a referral to outpt therapies at [facility] to address core muscle strengthenging, modalities--HEP focusing on facet based exercises/pilates.  2. Botox for migraine,  When financially feasible for her. Will see if botox can help with financial assistance.  3. Continue with nasal imitrex for breakthrough migraines 20 mg daily p.r.n.  4.  Dilaudid for severe breakthrough pain, 4-6mg  qd prn #50---i discussed the fact that i expect her to be more responsible with the use of the medication. 5. Recommend continuing her omega 3 also  6. Will try low dose ritalin for ADHD---this may secondarily help her anxiety and headaches. I would like her to get into see Dr. Valentina Shaggy eventually also.   I will see her back in about a month. Thirty minutes of face to face patient care time were spent during this visit. All questions were encouraged and answered. Marland Kitchen

## 2015-05-04 NOTE — Patient Instructions (Signed)
PLEASE CALL ME WITH ANY PROBLEMS OR QUESTIONS (#[phone]).  HAVE A GOOD DAY!    

## 2015-05-06 ENCOUNTER — Encounter: Payer: Medicare Other | Admitting: Physical Medicine & Rehabilitation

## 2015-05-13 ENCOUNTER — Encounter: Payer: Self-pay | Admitting: Internal Medicine

## 2015-05-13 ENCOUNTER — Encounter: Payer: Self-pay | Admitting: Gastroenterology

## 2015-06-02 ENCOUNTER — Other Ambulatory Visit: Payer: Self-pay | Admitting: Registered Nurse

## 2015-06-02 ENCOUNTER — Ambulatory Visit: Payer: Self-pay | Admitting: Registered Nurse

## 2015-06-02 ENCOUNTER — Encounter: Payer: Self-pay | Admitting: Registered Nurse

## 2015-06-02 ENCOUNTER — Encounter: Payer: Medicare Other | Attending: Registered Nurse | Admitting: Registered Nurse

## 2015-06-02 VITALS — BP 109/77 | HR 89

## 2015-06-02 DIAGNOSIS — G43111 Migraine with aura, intractable, with status migrainosus: Secondary | ICD-10-CM | POA: Diagnosis present

## 2015-06-02 DIAGNOSIS — M791 Myalgia: Secondary | ICD-10-CM | POA: Insufficient documentation

## 2015-06-02 DIAGNOSIS — G894 Chronic pain syndrome: Secondary | ICD-10-CM | POA: Insufficient documentation

## 2015-06-02 DIAGNOSIS — F411 Generalized anxiety disorder: Secondary | ICD-10-CM

## 2015-06-02 DIAGNOSIS — Z79899 Other long term (current) drug therapy: Secondary | ICD-10-CM | POA: Insufficient documentation

## 2015-06-02 DIAGNOSIS — Z5181 Encounter for therapeutic drug level monitoring: Secondary | ICD-10-CM | POA: Insufficient documentation

## 2015-06-02 DIAGNOSIS — G43119 Migraine with aura, intractable, without status migrainosus: Secondary | ICD-10-CM | POA: Diagnosis not present

## 2015-06-02 DIAGNOSIS — F900 Attention-deficit hyperactivity disorder, predominantly inattentive type: Secondary | ICD-10-CM

## 2015-06-02 DIAGNOSIS — M609 Myositis, unspecified: Secondary | ICD-10-CM | POA: Insufficient documentation

## 2015-06-02 DIAGNOSIS — M47816 Spondylosis without myelopathy or radiculopathy, lumbar region: Secondary | ICD-10-CM | POA: Diagnosis not present

## 2015-06-02 DIAGNOSIS — IMO0001 Reserved for inherently not codable concepts without codable children: Secondary | ICD-10-CM

## 2015-06-02 MED ORDER — HYDROMORPHONE HCL 4 MG PO TABS: ORAL_TABLET | ORAL | Status: DC

## 2015-06-02 NOTE — Progress Notes (Signed)
Subjective:    Patient ID: [name] [name], female    DOB: 01-23-1976, 39 y.o.   MRN: [id number]  HPI: [name] [name] is a 39 year old female who returns for follow up for chronic pain and medication refill. She says her pain is located in her jaws,neck and upper- lower back. Also states she has a frontal headache. Also states" she had a horrible migraine two weeks ago, she contemplated going to the ED she wanted to know the policy. Policy reviewed she verbalizes understanding. Also asked want side effect the Dilaudid could have on her organs, educated on the medications she was asking about her liver. Offered to draw LFT's she refuses at this time.  She rates her pain 6. Her current exercise regime is yoga, swimming and walking. Also states she spoke with Dr. [name] last month regarding  heightened anxiety with Xanax asked her about klonopin she states she had adverse effect on Klonopin she asked about valium. Dr. [name] note reviewed no documentation  regarding medication changes . I will speak to Dr. [name] on 06/06/15 and give her a call she verbalizes understanding. Also states the Ritalin has caused her severe headaches, she states she remembers this happen when she was on Ritalin in the past. I was in contact with Dr. Letta Pate recommended decreasing to dose to daily. Mrs. Ehly states this is how she is taking the medication. She wants to be switch to Adderall, this will be discussed with Dr. [name] she verbalizes understanding. She's weaning  off the Ritalin instructed not to abruptly stopped the medication she verbalizes understanding.  Pain Inventory Average Pain 6 Pain Right Now 6 My pain is constant, dull, stabbing and aching  In the last 24 hours, has pain interfered with the following? General activity 4 Relation with others 4 Enjoyment of life 4 What TIME of day is your pain at its worst? morning and evening Sleep (in general) Fair  Pain is worse with: bending, sitting,  inactivity and some activites Pain improves with: rest, heat/ice, therapy/exercise, medication and injections Relief from Meds: 7  Mobility walk without assistance how many minutes can you walk? 30 ability to climb steps?  yes do you drive?  yes  Function not employed: date last employed .  Neuro/Psych anxiety  Prior Studies Any changes since last visit?  no  Physicians involved in your care Any changes since last visit?  no   Family History  Problem Relation Age of Onset  . Heart disease Mother   . Diabetes Mother   . Heart disease Father   . Prostate cancer Father   . Heart disease    . Prostate cancer    . Diabetes    . Colon cancer Maternal Grandmother    Social History   Social History  . Marital Status: Married    Spouse Name: N/A  . Number of Children: N/A  . Years of Education: N/A   Social History Main Topics  . Smoking status: Never Smoker   . Smokeless tobacco: Never Used  . Alcohol Use: 0.6 oz/week    1 Glasses of wine per week  . Drug Use: No  . Sexual Activity: Yes   Other Topics Concern  . None   Social History Narrative   Past Surgical History  Procedure Laterality Date  . Transphenoidal / transnasal hypophysectomy / resection pituitary tumor  1999    resection [facility] benign pituitary tumor, [facility]  . Colonoscopy  01/03/2007  Dr. Silvano Rusk  . Esophagogastroduodenoscopy  09/29/2004    Dr. Kennedy Bucker  . Uterine fibroid surgery  2008  . Appendectomy  07/28/2014  . Laparoscopic appendectomy N/A 07/28/2014    Procedure: APPENDECTOMY LAPAROSCOPIC;  Surgeon: Autumn Messing III, MD;  Location: [facility];  Service: General;  Laterality: N/A;   Past Medical History  Diagnosis Date  . Seasonal allergies   . GERD (gastroesophageal reflux disease)   . IBS (irritable bowel syndrome)   . Adenomatous colon polyp 2008  . Constipation   . Sciatica   . Hematochezia   . Internal hemorrhoids   . 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  Complication of anesthesia     "didn't wake up well/remembered stuff from during OR when I had pituitary surgery"  . DVT (deep vein thrombosis) in pregnancy 09/2013    "LLE; postpartum"  . Sleep apnea     "don't currently use CPAP" (07/28/2014)  . Prolactin secreting pituitary adenoma 1999  . H/O hiatal hernia   . Migraines     "couple times/wk" (07/28/2014)  . Arthritis     "jaw joints; neck" (07/28/2014)  . Fibromyalgia   . Depression     hx  . Anxiety 1999/2000  . Panic disorder 1999/2000   BP 109/77 mmHg  Pulse 89  SpO2 100%  Opioid Risk Score:   Fall Risk Score:  `1  Depression screen PHQ 2/9  Depression screen [facility] 2/9 06/02/2015 01/12/2015  Decreased Interest 0 0  Down, Depressed, Hopeless 0 0  PHQ - 2 Score 0 0  Altered sleeping - 1  Tired, decreased energy - 1  Change in appetite - 0  Feeling bad or failure about yourself  - 0  Trouble concentrating - 1  Moving slowly or fidgety/restless - 0  Suicidal thoughts - 0  PHQ-9 Score - 3     Review of Systems  Neurological: Positive for headaches.       Headache was worse this month without relief from medications  All other systems reviewed and are negative.      Objective:   Physical Exam  Constitutional: She is oriented to person, place, and time. She appears well-developed and well-nourished.  HENT:  Head: Normocephalic and atraumatic.  Neck: Normal range of motion. Neck supple.  Cervical Paraspinal Tenderness: C-5- C-6  Cardiovascular: Normal rate and regular rhythm.   Pulmonary/Chest: Effort normal and breath sounds normal.  Musculoskeletal:  Normal Muscle Bulk and Muscle Testing Reveals: Upper Extremities: Full ROM and Muscle Strength 5/5 Thoracic Paraspinal Tenderness: T-1- T-3 Lumbar Paraspinal Tenderness: L-3- L-5 Lower Extremities: Full ROM and Muscle Strength 5/5 Arises from chair slowly Narrow based gait  Neurological: She is alert and oriented to person, place, and time.  Skin: Skin is warm  and dry.  Psychiatric: She has a normal mood and affect.  Nursing note and vitals reviewed.         Assessment & Plan:  1. Migraine headaches. Refilled Dilaudid 4-6mg  qd prn.  (1.5-2 tablets as needed for sever pain) #50.  2. Fibromyalgia syndrome. Continue with exercise regime 3. Depression: Referral was placed to Dr. Valentina Shaggy on 03/08/15 she was encouraged to F/U. Continue to Monitor 4. Chronic low back pain and cervicalgia. The patient with myofascial and spondylosis components. Continue with exercise and heat therapy.  30 minutes of face to face patient care time was spent during this visit. All questions were encouraged and answered.  F/U in 1 month

## 2015-06-03 ENCOUNTER — Ambulatory Visit: Payer: Self-pay | Admitting: Registered Nurse

## 2015-06-03 LAB — PMP ALCOHOL METABOLITE (ETG)

## 2015-06-06 LAB — METHYLPHENIDATE METAB, QUANT, U: RITALINIC ACID: 1428 ng/mL — AB (ref <100)

## 2015-06-08 LAB — PRESCRIPTION MONITORING PROFILE (SOLSTAS)
Amphetamine/Meth: NEGATIVE ng/mL
BARBITURATE SCREEN, URINE: NEGATIVE ng/mL
BENZODIAZEPINE SCREEN, URINE: NEGATIVE ng/mL
BUPRENORPHINE, URINE: NEGATIVE ng/mL
CARISOPRODOL, URINE: NEGATIVE ng/mL
Cannabinoid Scrn, Ur: NEGATIVE ng/mL
Cocaine Metabolites: NEGATIVE ng/mL
Creatinine, Urine: 165.71 mg/dL (ref >20.0)
FENTANYL URINE: NEGATIVE ng/mL
MDMA URINE: NEGATIVE ng/mL
Meperidine, Ur: NEGATIVE ng/mL
Methadone Screen, Urine: NEGATIVE ng/mL
NITRITES URINE, INITIAL: NEGATIVE ug/mL
Oxycodone Screen, Ur: NEGATIVE ng/mL
PROPOXYPHENE: NEGATIVE ng/mL
Tapentadol, urine: NEGATIVE ng/mL
Tramadol Scrn, Ur: NEGATIVE ng/mL
ZOLPIDEM, URINE: NEGATIVE ng/mL
pH, Initial: 6.1 pH (ref 4.5–8.9)

## 2015-06-08 LAB — OPIATES/OPIOIDS (LC/MS-MS)
CODEINE URINE: NEGATIVE ng/mL (ref <50)
Hydrocodone: 196 ng/mL — AB (ref <50)
Hydromorphone: 185 ng/mL (ref <50)
MORPHINE: NEGATIVE ng/mL (ref <50)
NORHYDROCODONE, UR: 755 ng/mL — AB (ref <50)
Noroxycodone, Ur: NEGATIVE ng/mL (ref <50)
OXYCODONE, UR: NEGATIVE ng/mL (ref <50)
OXYMORPHONE, URINE: NEGATIVE ng/mL (ref <50)

## 2015-06-08 LAB — ETHYL GLUCURONIDE, URINE
ETGU: 800 ng/mL — AB (ref <500)
Ethyl Sulfate (ETS): 126 ng/mL — ABNORMAL HIGH (ref <100)

## 2015-06-10 NOTE — Progress Notes (Signed)
Urine drug screen for this encounter is inconsistent Positive for alcohol as well as unprescribed hydrocodone.  Per verbal order of Dr [name], discharge from clinic as multiple warning have been issued.

## 2015-06-13 ENCOUNTER — Telehealth: Payer: Self-pay | Admitting: *Deleted

## 2015-06-13 DIAGNOSIS — G43111 Migraine with aura, intractable, with status migrainosus: Secondary | ICD-10-CM

## 2015-06-13 DIAGNOSIS — IMO0001 Reserved for inherently not codable concepts without codable children: Secondary | ICD-10-CM

## 2015-06-13 NOTE — Telephone Encounter (Addendum)
Notified of discharge from clinic for violation of narcotic CSA.She is asking about her botox appt.  She will remain discharged.

## 2015-06-14 MED ORDER — SUMATRIPTAN SUCCINATE 50 MG PO TABS: ORAL_TABLET | ORAL | Status: DC

## 2015-06-14 MED ORDER — SUMATRIPTAN 20 MG/ACT NA SOLN: 20.0000 mg | Freq: Once | NASAL | Status: DC

## 2015-06-14 MED ORDER — ALPRAZOLAM 0.25 MG PO TABS: 0.2500 mg | ORAL_TABLET | Freq: Two times a day (BID) | ORAL | Status: DC | PRN

## 2015-06-14 NOTE — Telephone Encounter (Signed)
correct 

## 2015-06-14 NOTE — Telephone Encounter (Signed)
[name] [name] is needing wean down doses on her ritalin, hydromorphone, and xanax though she doesn't take it very often. (sumitriptan?)

## 2015-06-14 NOTE — Telephone Encounter (Signed)
I have spoken with [name] [name] and given her the information.  I have called one month xanax to pharmacy and refilled the sumitriptan each for 2 months.  I have given her instructions that she seriously needs to get herself a PCP.  She was asking about [place] prescribing an antidepressant and other things and she feels she needs because [name] functioned as her PCP. I informed her that he is not her PCP and it is not our responsibility to act as one. I suggested [facility] on [address].  She may request her records be sent to who ever she chooses or she may pick them up.

## 2015-06-14 NOTE — Telephone Encounter (Signed)
Patient can take dilaudid one tab daily for 2 weeks then 1/2 tab daily for 2 weeks then off. Will give her xanax rx x one month. No ritalin taper. Other meds can be given with 2 month refills

## 2015-06-14 NOTE — Telephone Encounter (Signed)
Wean down on her meds?

## 2015-06-30 ENCOUNTER — Ambulatory Visit: Payer: Self-pay | Admitting: Registered Nurse

## 2015-08-01 ENCOUNTER — Ambulatory Visit: Payer: Self-pay | Admitting: Physical Medicine & Rehabilitation

## 2015-08-03 ENCOUNTER — Ambulatory Visit: Payer: Self-pay | Admitting: Physical Medicine & Rehabilitation

## 2015-09-26 ENCOUNTER — Encounter: Payer: Self-pay | Admitting: Neurology

## 2015-09-26 ENCOUNTER — Ambulatory Visit (INDEPENDENT_AMBULATORY_CARE_PROVIDER_SITE_OTHER): Payer: Medicare Other | Admitting: Neurology

## 2015-09-26 VITALS — BP 119/74 | HR 94 | Ht 66.0 in | Wt 157.0 lb

## 2015-09-26 DIAGNOSIS — D352 Benign neoplasm of pituitary gland: Secondary | ICD-10-CM

## 2015-09-26 DIAGNOSIS — M609 Myositis, unspecified: Secondary | ICD-10-CM

## 2015-09-26 DIAGNOSIS — G43119 Migraine with aura, intractable, without status migrainosus: Secondary | ICD-10-CM

## 2015-09-26 DIAGNOSIS — Z9889 Other specified postprocedural states: Secondary | ICD-10-CM | POA: Insufficient documentation

## 2015-09-26 DIAGNOSIS — M791 Myalgia: Secondary | ICD-10-CM

## 2015-09-26 DIAGNOSIS — IMO0001 Reserved for inherently not codable concepts without codable children: Secondary | ICD-10-CM

## 2015-09-26 MED ORDER — VERAPAMIL HCL 40 MG PO TABS: 40.0000 mg | ORAL_TABLET | Freq: Two times a day (BID) | ORAL | Status: DC

## 2015-09-26 MED ORDER — SUMATRIPTAN 20 MG/ACT NA SOLN: 20.0000 mg | Freq: Once | NASAL | Status: DC

## 2015-09-26 MED ORDER — DICLOFENAC POTASSIUM(MIGRAINE) 50 MG PO PACK: 50.0000 mg | PACK | ORAL | Status: DC | PRN

## 2015-09-26 NOTE — Progress Notes (Signed)
PATIENT: [name] [name] DOB: 09-15-1976  Chief Complaint  Patient presents with  . Migraine    She estimates having two migraines per week but they tend to be more frequent around her monthly cycle.  She also has multiple days of less severe headaches.  Sumatriptan is sometimes helpful.  She tends to have nausea and visual changes with her migraines. She has tried prophylactic medications in the past.  She has previously had good results with Botox.     HISTORICAL  [name] [name] is a 39 years old right-handed female, seen in refer by primary care physician Dr. Nancy Fetter for evaluation of frequent headaches  I reviewed and summarized office note, she had a history of pituitary adenoma, had trans-sphenoid pituitary adenoma resection surgery at [facility] in 1999, prior to the surgery, she had breast discharge, frequent migraine headache with aura, occasionally double vision, most recent MRI of the brain in record was in 2001, there was no evidence of recurrent tumor.   She was a patient of our clinic in 2008 by Dr. Gaynell Face, for chronic migraine, she was also evaluated by [facility] Doctor 8 omitting the past, per record, she has tried and failed multiple preventive medications, Topamax, Depakote, nortriptyline, gabapentin, Lyrica, Inderal she has difficulty tolerating the medications well magnesium oxide, riboflavin, coenzyme Q 10 the-counter medications, Relpax, also tried dietary supplement magnesium, riboflavin, coenzyme Q 10,  For abortive treatment, she also tried and failed Maxalt, Relpax, Zomig, Frova, Imitrex tablet, she is currently taking Imitrex nasal spray as needed, it helped her about 50% of the time if she take the medicine during early headaches  since her most recent pregnancy in 2014, she began to have gradual worsening migraine headaches, since 2015, she has about 5-6 migraine headaches each week, trigger for her migraines are strong smells, bright light, food additives,  hungry, stress, exertion, smoker  Her typical migraine are right retro-orbital area severe pounding headache with associated light noise sensitivity, nauseous, lasting one day,  For now, she was receiving Botox injection with Dr. Cyril Mourning at [facility] in 2014, but has difficulty affording the bills  I have reviewed MRI of lumbar reported without contrast from [facility] February 05 2015, multilevel degenerative disc disease, no significant foraminal or canal stenosis. Laboratory result August 30 first 2016, low normal TSH 0.7, normal CMP, with creatinine 0.8, vitamin D was 29.2, T3 total was within normal limits 85.5, normal CBC with hemoglobin of 12 point 3, normal PTH, free T4, UDS was negative   REVIEW OF SYSTEMS: Full 14 system review of systems performed and notable only for joint pain, achy muscles, headaches, easy bruising ALLERGIES: Allergies  Allergen Reactions  . Latex Rash    HOME MEDICATIONS: Current Outpatient Prescriptions  Medication Sig Dispense Refill  . buPROPion (WELLBUTRIN SR) 150 MG 12 hr tablet Take 150 mg by mouth daily.  2  . Multiple Vitamin (MULTIVITAMIN) capsule Take 1 capsule by mouth daily.    Marland Kitchen oxyCODONE (OXY IR/ROXICODONE) 5 MG immediate release tablet Take 5 mg by mouth as needed.  0  . SUMAtriptan (IMITREX) 20 MG/ACT nasal spray Place 1 spray (20 mg total) into the nose once. May repeat in 2 hours if headache persists or recurs. 1 Inhaler 1   No current facility-administered medications for this visit.    PAST MEDICAL HISTORY: Past Medical History  Diagnosis Date  . Seasonal allergies   . GERD (gastroesophageal reflux disease)   . IBS (irritable bowel syndrome)   .  Adenomatous colon polyp 2008  . Constipation   . Sciatica   . Hematochezia   . Internal hemorrhoids   . 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 Complication of anesthesia     "didn't wake up well/remembered stuff from during OR when I had pituitary surgery"  . DVT  (deep vein thrombosis) in pregnancy 09/2013    "LLE; postpartum"  . Sleep apnea     "don't currently use CPAP" (07/28/2014)  . Prolactin secreting pituitary adenoma ([place]) 1999  . H/O hiatal hernia   . Migraines     "couple times/wk" (07/28/2014)  . Arthritis     "jaw joints; neck" (07/28/2014)  . Fibromyalgia   . Depression     hx  . Anxiety 1999/2000  . Panic disorder 1999/2000    PAST SURGICAL HISTORY: Past Surgical History  Procedure Laterality Date  . Transphenoidal / transnasal hypophysectomy / resection pituitary tumor  1999    resection of benign pituitary tumor, [facility]  . Colonoscopy  01/03/2007    Dr. Silvano Rusk  . Esophagogastroduodenoscopy  09/29/2004    Dr. Kennedy Bucker  . Uterine fibroid surgery  2008  . Appendectomy  07/28/2014  . Laparoscopic appendectomy N/A 07/28/2014    Procedure: APPENDECTOMY LAPAROSCOPIC;  Surgeon: Autumn Messing III, MD;  Location: [facility];  Service: General;  Laterality: N/A;    FAMILY HISTORY: Family History  Problem Relation Age of Onset  . Heart disease Mother   . Diabetes Mother   . Heart disease Father   . Prostate cancer Father   . Heart disease    . Prostate cancer    . Diabetes    . Colon cancer Maternal Grandmother   . Alzheimer's disease Father     SOCIAL HISTORY:  Social History   Social History  . Marital Status: Married    Spouse Name: N/A  . Number of Children: 3  . Years of Education: College   Occupational History  . Homemaker    Social History Main Topics  . Smoking status: Never Smoker   . Smokeless tobacco: Never Used  . Alcohol Use: 0.6 oz/week    1 Glasses of wine per week     Comment: Rarely - less than one drink every few months  . Drug Use: No  . Sexual Activity: Yes   Other Topics Concern  . Not on file   Social History Narrative   Lives at home with her husband and three children.   Right-handed.   Rare caffeine use.     PHYSICAL EXAM   Filed Vitals:   09/26/15 1613    BP: 119/74  Pulse: 94  Height: 5\' 6"  (1.676 m)  Weight: 157 lb (71.215 kg)    Not recorded      Body mass index is 25.35 kg/(m^2).  PHYSICAL EXAMNIATION:  Gen: NAD, conversant, well nourised, obese, well groomed                     Cardiovascular: Regular rate rhythm, no peripheral edema, warm, nontender. Eyes: Conjunctivae clear without exudates or hemorrhage Neck: Supple, no carotid bruise. Pulmonary: Clear to auscultation bilaterally   NEUROLOGICAL EXAM:  MENTAL STATUS: Speech:    Speech is normal; fluent and spontaneous with normal comprehension.  Cognition:     Orientation to time, place and person     Normal recent and remote memory     Normal Attention span and concentration     Normal Language, naming,  repeating,spontaneous speech     Fund of knowledge   CRANIAL NERVES: CN II: Visual fields are full to confrontation. Fundoscopic exam is normal with sharp discs and no vascular changes. Pupils are round equal and briskly reactive to light. CN III, IV, VI: extraocular movement are normal. No ptosis. CN V: Facial sensation is intact to pinprick in all 3 divisions bilaterally. Corneal responses are intact.  CN VII: Face is symmetric with normal eye closure and smile. CN VIII: Hearing is normal to rubbing fingers CN IX, X: Palate elevates symmetrically. Phonation is normal. CN XI: Head turning and shoulder shrug are intact CN XII: Tongue is midline with normal movements and no atrophy.  MOTOR: There is no pronator drift of out-stretched arms. Muscle bulk and tone are normal. Muscle strength is normal.  REFLEXES: Reflexes are 2+ and symmetric at the biceps, triceps, knees, and ankles. Plantar responses are flexor.  SENSORY: Intact to light touch, pinprick, position sense, and vibration sense are intact in fingers and toes.  COORDINATION: Rapid alternating movements and fine finger movements are intact. There is no dysmetria on finger-to-nose and heel-knee-shin.     GAIT/STANCE: Posture is normal. Gait is steady with normal steps, base, arm swing, and turning. Heel and toe walking are normal. Tandem gait is normal.  Romberg is absent.   DIAGNOSTIC DATA (LABS, IMAGING, TESTING) - I reviewed patient records, labs, notes, testing and imaging myself where available.   ASSESSMENT AND PLAN  ZAKIYAH DASARI is a 39 y.o. female   Chronic migraine headaches  Tried and failed multiple different preventive medications in the past, Topamax, nortriptyline, Inderal, gabapentin, Lyrica, could not tolerate multiple medications due  to side effect  Will try low dose verapamil 40 mg twice a day as preventive medications,  Continue Imitrex nasal spray as needed, and camb[place] as needed for abortive treatment  Botox injection for migraine prevention, start preauthorization process today,  Fibromyalgia History of pituitary adenoma,  status post transsphenoidal resection  Repeat MRI of the brain with and without contrast   Marcial Pacas, M.D. Ph.D.  [facility] [address], [place], [place] [address] Ph: [phone] Fax: [phone]  CC: Sandi Mariscal

## 2015-09-28 ENCOUNTER — Ambulatory Visit: Payer: Self-pay | Admitting: Neurology

## 2015-10-18 ENCOUNTER — Ambulatory Visit (INDEPENDENT_AMBULATORY_CARE_PROVIDER_SITE_OTHER): Payer: Self-pay

## 2015-10-18 DIAGNOSIS — D352 Benign neoplasm of pituitary gland: Secondary | ICD-10-CM | POA: Diagnosis not present

## 2015-10-18 DIAGNOSIS — G43119 Migraine with aura, intractable, without status migrainosus: Secondary | ICD-10-CM | POA: Diagnosis not present

## 2015-10-18 DIAGNOSIS — Z0289 Encounter for other administrative examinations: Secondary | ICD-10-CM

## 2015-10-18 DIAGNOSIS — IMO0001 Reserved for inherently not codable concepts without codable children: Secondary | ICD-10-CM

## 2015-10-20 ENCOUNTER — Telehealth: Payer: Self-pay | Admitting: Neurology

## 2015-10-20 NOTE — Telephone Encounter (Signed)
Please call patient, MRI of the brain showed evidence of previous transsphenoidal pituitary resection, pituitary gland has normal signal currently  Evidence of mild supratentorium microvascular ischemic change, consistent with her history of  migraine  IMPRESSION: This MRI of the brain with and without contrast shows the following: 1. Postoperative changes with in the sphenoid and sella turcica consistent with transsphenoidal pituitary microadenoma resection in the past. The pituitary gland has a normal signal, currently. 2. There are several small subcortical T2/FLAIR hyperintense foci in the frontal lobes. This is a nonspecific finding but could be due to the patient's history of migraine and could also represent chronic microvascular ischemic change. Demyelination is less likely. The brain is otherwise normal. 3. There are no acute findings.

## 2015-10-20 NOTE — Telephone Encounter (Signed)
Called pt to discuss MRI results. No answer, left a message asking her to call me back.

## 2015-10-20 NOTE — Telephone Encounter (Signed)
Pt returned [name] [name]'s call °

## 2015-10-20 NOTE — Telephone Encounter (Signed)
Returned pt's call, no answer, left a message asking her to call me back.

## 2015-10-25 ENCOUNTER — Telehealth: Payer: Self-pay | Admitting: Neurology

## 2015-10-25 NOTE — Telephone Encounter (Signed)
Left message for a return call

## 2015-10-25 NOTE — Telephone Encounter (Signed)
Called patient to inform her that we are referring her to [facility]. She understood and stated that she already had their number.

## 2015-10-26 ENCOUNTER — Telehealth: Payer: Self-pay | Admitting: Neurology

## 2015-10-26 NOTE — Telephone Encounter (Signed)
Patient aware of results - she will come in for a follow up for further discussion.

## 2015-10-26 NOTE — Telephone Encounter (Signed)
Pt returned call regarding MRI results. Please call and advise [phone]

## 2015-10-26 NOTE — Telephone Encounter (Signed)
Duplicate telephone encounter - please see other note.

## 2015-11-03 ENCOUNTER — Telehealth: Payer: Self-pay | Admitting: Neurology

## 2015-11-03 ENCOUNTER — Ambulatory Visit: Payer: Self-pay | Admitting: Neurology

## 2015-11-03 ENCOUNTER — Encounter: Payer: Self-pay | Admitting: Neurology

## 2015-11-03 ENCOUNTER — Ambulatory Visit (INDEPENDENT_AMBULATORY_CARE_PROVIDER_SITE_OTHER): Payer: Medicare Other | Admitting: Neurology

## 2015-11-03 VITALS — BP 112/74 | HR 85 | Temp 97.6°F | Resp 16 | Ht 66.0 in | Wt 158.0 lb

## 2015-11-03 DIAGNOSIS — G43119 Migraine with aura, intractable, without status migrainosus: Secondary | ICD-10-CM | POA: Diagnosis not present

## 2015-11-03 MED ORDER — LAMOTRIGINE 100 MG PO TABS: ORAL_TABLET | ORAL | Status: DC

## 2015-11-03 MED ORDER — OMEGA-3-ACID ETHYL ESTERS 1 G PO CAPS: 1.0000 g | ORAL_CAPSULE | Freq: Every day | ORAL | Status: DC

## 2015-11-03 NOTE — Telephone Encounter (Signed)
[name] [name], we will do please help patient to get in touch with allergen Botox assistant program, to see if she is qualified to get free toxin through their program.

## 2015-11-03 NOTE — Telephone Encounter (Signed)
Botox Patient Assist Number is listed as [phone] opt 4.  I called the patient to relay this info.  Got no answer.  Left message.

## 2015-11-03 NOTE — Telephone Encounter (Signed)
At check-out pt stated she didn't not want to make her 3 month f/u with the NP just yet.  She said she'd call to schedule, that she is waiting to hear back about insurance authorizing something (didn't specify what) and she'd schedule an appt for both.

## 2015-11-03 NOTE — Progress Notes (Signed)
Chief Complaint  Patient presents with  . Migraine    rm 5, "no improvement in HA, migraines; freq of HA 3-5 wk, migraines avg 2/wk"  . Follow-up    1 month      PATIENT: [name] [name] DOB: 17-Nov-1975  Chief Complaint  Patient presents with  . Migraine    rm 5, "no improvement in HA, migraines; freq of HA 3-5 wk, migraines avg 2/wk"  . Follow-up    1 month     HISTORICAL  [name] [name] is a 40 years old right-handed female, seen in refer by primary care physician Dr. Nancy Fetter for evaluation of frequent headaches  I reviewed and summarized office note, she had a history of pituitary adenoma, had trans-sphenoid pituitary adenoma resection surgery at [facility] in 1999, prior to the surgery, she had breast discharge, frequent migraine headache with aura, occasionally double vision, most recent MRI of the brain in record was in 2001, there was no evidence of recurrent tumor.   She was a patient of our clinic in 2008 by Dr. Gaynell Face, for chronic migraine, she was also evaluated by [facility] in past, per record, she has tried and failed multiple preventive medications, Topamax, Depakote, nortriptyline, gabapentin, Lyrica, Inderal she has difficulty tolerating the medications well magnesium oxide, riboflavin, coenzyme Q 10 the-counter medications, Relpax, also tried dietary supplement magnesium, riboflavin, coenzyme Q 10,  For abortive treatment, she also tried and failed Maxalt, Relpax, Zomig, Frova, Imitrex tablet, she is currently taking Imitrex nasal spray as needed, it helped her about 50% of the time if she take the medicine during early onset of headaches  since her most recent pregnancy in 2014, she began to have gradual worsening migraine headaches, since 2015, she has about 5-6 migraine headaches each week, trigger for her migraines are strong smells, bright light, food additives, hungry, stress, exertion, smoking  Her typical migraine are right retro-orbital area severe  pounding headache with associated light noise sensitivity, nauseous, lasting one day,  For now, she was receiving Botox injection with Dr. Cyril Mourning at [facility] rehabilitation since 2014, which did helped her migraine, but has difficulty affording the bills  I have reviewed MRI of lumbar reported without contrast from [facility] February 05 2015, multilevel degenerative disc disease, no significant foraminal or canal stenosis. Laboratory result August 30 first 2016, low normal TSH 0.7, normal CMP, with creatinine 0.8, vitamin D was 29.2, T3 total was within normal limits 85.5, normal CBC with hemoglobin of 12 point 3, normal PTH, free T4, UDS was negative  UPDATE Nov 03 2015: She has tried verapamil 40 mg twice a day for 3 weeks, complains of GI side effect, dizziness, is no longer taking it, it did not help her headache either. Insurance would not cover cambia, she is now taking Imitrex nasal spray or tablet 100mg  1-2/week, oxycodone 5mg  prn (5 days a week), Flexeril 10mg  as needed for headaches,  We have reviewed MRI of the brain with and without contrast: Postoperative changes with in the sphenoid and sella turcica consistent with transsphenoidal pituitary microadenoma resection in the past. The pituitary gland has a normal signal, currently. There are several small subcortical T2/FLAIR hyperintense foci in the frontal lobes. This is a nonspecific finding but could be due to the patient's history of migraine and could also represent chronic microvascular ischemic change.   She still has headaches daily, couple times each week is seen involving to a more severe prolonged migraine headaches, her headache is usually  triggered  by smells, muscle tightness at her neck and shoulder, hungry  REVIEW OF SYSTEMS: Full 14 system review of systems performed and notable only for light sensitivity, joint pain, dizziness, headaches  ALLERGIES: Allergies  Allergen Reactions  . Latex Rash  . Verapamil Nausea  Only    Headache, dizziness    HOME MEDICATIONS: Current Outpatient Prescriptions  Medication Sig Dispense Refill  . buPROPion (WELLBUTRIN SR) 150 MG 12 hr tablet Take 150 mg by mouth daily.  2  . cyclobenzaprine (FLEXERIL) 5 MG tablet Take 5 mg by mouth 3 (three) times daily as needed.  0  . Multiple Vitamin (MULTIVITAMIN) capsule Take 1 capsule by mouth daily.    Marland Kitchen oxyCODONE (OXY IR/ROXICODONE) 5 MG immediate release tablet Take 5 mg by mouth as needed.  0  . SUMAtriptan (IMITREX) 20 MG/ACT nasal spray Place 1 spray (20 mg total) into the nose once. May repeat in 2 hours if headache persists or recurs. 12 Inhaler 6  . Diclofenac Potassium (CAMBIA) 50 MG PACK Take 50 mg by mouth as needed. (Patient not taking: Reported on 11/03/2015) 15 each 6   No current facility-administered medications for this visit.    PAST MEDICAL HISTORY: Past Medical History  Diagnosis Date  . Seasonal allergies   . GERD (gastroesophageal reflux disease)   . IBS (irritable bowel syndrome)   . Adenomatous colon polyp 2008  . Constipation   . Sciatica   . Hematochezia   . Internal hemorrhoids   . 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 Complication of anesthesia     "didn't wake up well/remembered stuff from during OR when I had pituitary surgery"  . DVT (deep vein thrombosis) in pregnancy 09/2013    "LLE; postpartum"  . Sleep apnea     "don't currently use CPAP" (07/28/2014)  . Prolactin secreting pituitary adenoma ([place]) 1999  . H/O hiatal hernia   . Migraines     "couple times/wk" (07/28/2014)  . Arthritis     "jaw joints; neck" (07/28/2014)  . Fibromyalgia   . Depression     hx  . Anxiety 1999/2000  . Panic disorder 1999/2000    PAST SURGICAL HISTORY: Past Surgical History  Procedure Laterality Date  . Transphenoidal / transnasal hypophysectomy / resection pituitary tumor  1999    resection of benign pituitary tumor, [facility]  . Colonoscopy  01/03/2007    Dr. Silvano Rusk  .  Esophagogastroduodenoscopy  09/29/2004    Dr. Kennedy Bucker  . Uterine fibroid surgery  2008  . Appendectomy  07/28/2014  . Laparoscopic appendectomy N/A 07/28/2014    Procedure: APPENDECTOMY LAPAROSCOPIC;  Surgeon: Autumn Messing III, MD;  Location: [facility] OR;  Service: General;  Laterality: N/A;    FAMILY HISTORY: Family History  Problem Relation Age of Onset  . Heart disease Mother   . Diabetes Mother   . Heart disease Father   . Prostate cancer Father   . Heart disease    . Prostate cancer    . Diabetes    . Colon cancer Maternal Grandmother   . Alzheimer's disease Father     SOCIAL HISTORY:  Social History   Social History  . Marital Status: Married    Spouse Name: N/A  . Number of Children: 3  . Years of Education: College   Occupational History  . Homemaker    Social History Main Topics  . Smoking status: Never Smoker   . Smokeless tobacco: Never Used  . Alcohol Use: 0.6  oz/week    1 Glasses of wine per week     Comment: Rarely - less than one drink every few months  . Drug Use: No  . Sexual Activity: Yes   Other Topics Concern  . Not on file   Social History Narrative   Lives at home with her husband and three children.   Right-handed.   Rare caffeine use.     PHYSICAL EXAM   Filed Vitals:   11/03/15 0932  BP: 112/74  Pulse: 85  Temp: 97.6 F (36.4 C)  Resp: 16  Height: 5\' 6"  (1.676 m)  Weight: 158 lb (71.668 kg)    Not recorded      Body mass index is 25.51 kg/(m^2).  PHYSICAL EXAMNIATION:  Gen: NAD, conversant, well nourised, obese, well groomed                     Cardiovascular: Regular rate rhythm, no peripheral edema, warm, nontender. Eyes: Conjunctivae clear without exudates or hemorrhage Neck: Supple, no carotid bruise. Pulmonary: Clear to auscultation bilaterally   NEUROLOGICAL EXAM:  MENTAL STATUS: Speech:    Speech is normal; fluent and spontaneous with normal comprehension.  Cognition:     Orientation to time, place and  person     Normal recent and remote memory     Normal Attention span and concentration     Normal Language, naming, repeating,spontaneous speech     Fund of knowledge   CRANIAL NERVES: CN II: Visual fields are full to confrontation. Fundoscopic exam is normal with sharp discs and no vascular changes. Pupils are round equal and briskly reactive to light. CN III, IV, VI: extraocular movement are normal. No ptosis. CN V: Facial sensation is intact to pinprick in all 3 divisions bilaterally. Corneal responses are intact.  CN VII: Face is symmetric with normal eye closure and smile. CN VIII: Hearing is normal to rubbing fingers CN IX, X: Palate elevates symmetrically. Phonation is normal. CN XI: Head turning and shoulder shrug are intact CN XII: Tongue is midline with normal movements and no atrophy.  MOTOR: There is no pronator drift of out-stretched arms. Muscle bulk and tone are normal. Muscle strength is normal.  REFLEXES: Reflexes are 2+ and symmetric at the biceps, triceps, knees, and ankles. Plantar responses are flexor.  SENSORY: Intact to light touch, pinprick, position sense, and vibration sense are intact in fingers and toes.  COORDINATION: Rapid alternating movements and fine finger movements are intact. There is no dysmetria on finger-to-nose and heel-knee-shin.    GAIT/STANCE: Posture is normal. Gait is steady with normal steps, base, arm swing, and turning. Heel and toe walking are normal. Tandem gait is normal.  Romberg is absent.   DIAGNOSTIC DATA (LABS, IMAGING, TESTING) - I reviewed patient records, labs, notes, testing and imaging myself where available.   ASSESSMENT AND PLAN  [name] [name] is a 40 y.o. female    Chronic migraine headaches  Tried and failed multiple different preventive medications in the past, Topamax, nortriptyline, Inderal, gabapentin, Lyrica, verapmil could not tolerate multiple  medications due to side effect  Add on lamotrigine  titrating to 100 mg twice a day as preventive medications   Continue Imitrex nasal spray as needed, and NSAIDs as needed for abortive treatment  Will try BOTOX injection through other [facility] program  Depression History of pituitary adenoma,  status post transsphenoidal resection  Repeat MRI of the brain showed postsurgical change, with normal pituitary gland, small amount of small vessel  disease consistent with her history of chronic migraine   Marcial Pacas, M.D. Ph.D.  [facility] [address] [place], [place] [address] Ph: [phone] Fax: [phone]  CC: Sandi Mariscal

## 2015-11-25 ENCOUNTER — Telehealth: Payer: Self-pay | Admitting: Neurology

## 2015-11-25 NOTE — Telephone Encounter (Signed)
Pt called and is not able to get assistance  for her migraine medication. She wants to know if there is another medication that might help. Please call and advise [phone]

## 2015-11-28 NOTE — Telephone Encounter (Signed)
Botox Patient Assist Number is listed as [phone] opt 4.  She has spoken with patient - she has called the Botox assistance number and is not eligible for help.  She cannot afford her co-pay after insurance.  Can you please provide her guidance with other co-pay assistance resources?

## 2015-11-30 NOTE — Telephone Encounter (Signed)
Called and spoke to patient and relayed to her that I was not sure but I would try to reach out to some resources and give her a call back on Monday 12/05/2015. Patient needs help with her co pay . Patient understood and was fine with this process.

## 2015-12-05 NOTE — Telephone Encounter (Signed)
Spoke to [place] with Botox and she relayed that patient should not have much of a co pay that's why Patient Assist. Program is not accepting her. Called patient to relay information and she stated she has to get her card worked out with medicaid because her card is wrong. Patient has been trying to call medicaid no response  Yet Patient will call us back with update.

## 2016-02-06 ENCOUNTER — Telehealth: Payer: Self-pay | Admitting: Neurology

## 2016-02-06 NOTE — Telephone Encounter (Signed)
Patient called to schedule BOTOX appointment. Is aware [name] [name] is out of the office today, will await phone call tomorrow from [place].

## 2016-02-09 NOTE — Telephone Encounter (Signed)
Called patient and scheduled apt.

## 2016-03-12 ENCOUNTER — Telehealth: Payer: Self-pay | Admitting: *Deleted

## 2016-03-12 NOTE — Telephone Encounter (Signed)
Spoke to patient - she will keep her current pending appt - no need to change time.

## 2016-03-12 NOTE — Telephone Encounter (Signed)
Left message of voicemail to return my call (may need to reschedule pending appt).

## 2016-03-13 ENCOUNTER — Telehealth: Payer: Self-pay | Admitting: Neurology

## 2016-03-13 NOTE — Telephone Encounter (Signed)
Spoke with patient who stated she no longer has medicaid and will have to be B/B.

## 2016-03-13 NOTE — Telephone Encounter (Signed)
Spoke with the [facility] pharmacy regarding the patients botox. They stated that they have been trying to reach the patient to confirm insurance information with no luck. They also stated that medicaid was not active. If this is the case the patient will have to be B/B. I have notified Dr. Krista Blue.

## 2016-03-14 ENCOUNTER — Ambulatory Visit (INDEPENDENT_AMBULATORY_CARE_PROVIDER_SITE_OTHER): Payer: Medicare Other | Admitting: Neurology

## 2016-03-14 ENCOUNTER — Encounter: Payer: Self-pay | Admitting: Neurology

## 2016-03-14 ENCOUNTER — Encounter: Payer: Self-pay | Admitting: *Deleted

## 2016-03-14 VITALS — BP 122/74 | HR 85 | Ht 66.0 in | Wt 165.2 lb

## 2016-03-14 DIAGNOSIS — G43719 Chronic migraine without aura, intractable, without status migrainosus: Secondary | ICD-10-CM

## 2016-03-14 MED ORDER — ONABOTULINUMTOXINA 100 UNITS IJ SOLR
200.0000 [IU] | Freq: Once | INTRAMUSCULAR | Status: AC
  Administered 2016-03-14: 200 [IU] via INTRAMUSCULAR

## 2016-03-14 NOTE — Progress Notes (Signed)
**  Botox 100 units x 2 vials, Lot [id number],  Exp 08/2018, [place] [id number], office supply.**mck,rn.

## 2016-03-14 NOTE — Progress Notes (Signed)
No chief complaint on file.     PATIENT: [name] [name] DOB: 08-20-1976  No chief complaint on file.    HISTORICAL  [name] [name] is a 40 years old right-handed female, seen in refer by primary care physician Dr. Nancy Fetter for evaluation of frequent headaches  I reviewed and summarized office note, she had a history of pituitary adenoma, had trans-sphenoid pituitary adenoma resection surgery at [facility] in 1999, prior to the surgery, she had breast discharge, frequent migraine headache with aura, occasionally double vision, most recent MRI of the brain in record was in 2001, there was no evidence of recurrent tumor.   She was a patient of our clinic in 2008 by Dr. Gaynell Face, for chronic migraine, she was also evaluated by [facility] in past, per record, she has tried and failed multiple preventive medications, Topamax, Depakote, nortriptyline, gabapentin, Lyrica, Inderal she has difficulty tolerating the medications  magnesium oxide, riboflavin, coenzyme Q 10 the-counter medications, Relpax, also tried dietary supplement magnesium, riboflavin, coenzyme Q 10,  For abortive treatment, she also tried and failed Maxalt, Relpax, Zomig, Frova, Imitrex tablet, she is currently taking Imitrex nasal spray as needed, it helped her about 50% of the time if she take the medicine during early onset of headaches  since her most recent pregnancy in 2014, she began to have gradual worsening migraine headaches, since 2015, she has about 5-6 migraine headaches each week, trigger for her migraines are strong smells, bright light, food additives, hungry, stress, exertion, smoking  Her typical migraine are right retro-orbital area severe pounding headache with associated light noise sensitivity, nauseous, lasting one day,  For now, she was receiving Botox injection with Dr. Cyril Mourning at [facility] rehabilitation since 2014, which did helped her migraine, but has difficulty affording the bills  I have reviewed MRI of  lumbar reported without contrast from [facility] February 05 2015, multilevel degenerative disc disease, no significant foraminal or canal stenosis. Laboratory result August 30 first 2016, low normal TSH 0.7, normal CMP, with creatinine 0.8, vitamin D was 29.2, T3 total was within normal limits 85.5, normal CBC with hemoglobin of 12 point 3, normal PTH, free T4, UDS was negative  UPDATE Nov 03 2015: She has tried verapamil 40 mg twice a day for 3 weeks, complains of GI side effect, dizziness, is no longer taking it, it did not help her headache either. Insurance would not cover cambia, she is now taking Imitrex nasal spray or tablet 100mg  1-2/week, oxycodone 5mg  prn (5 days a week), Flexeril 10mg  as needed for headaches,  We have reviewed MRI of the brain with and without contrast: Postoperative changes with in the sphenoid and sella turcica consistent with transsphenoidal pituitary microadenoma resection in the past. The pituitary gland has a normal signal, currently. There are several small subcortical T2/FLAIR hyperintense foci in the frontal lobes. This is a nonspecific finding but could be due to the patient's history of migraine and could also represent chronic microvascular ischemic change.   She still has headaches daily, couple times each week, her headache evolved to a more severe prolonged migraine headaches, her headache is usually  triggered by smells, muscle tightness at her neck and shoulder, hungry  UPDATE Mar 14 2016:  Patient has received Botox injection through: Rehabilitation Dr. [name] office for many years, reported mild neck extension weakness during her first injection around 2014, most recent injection was in March 2016, she received 160 units, denies significant side effect, she reported significant improvement of her migraine  headaches with Botox injection,   REVIEW OF SYSTEMS: Full 14 system review of systems performed and notable only for light sensitivity, joint  pain, dizziness, headaches  ALLERGIES: Allergies  Allergen Reactions  . Latex Rash  . Verapamil Nausea Only    Headache, dizziness    HOME MEDICATIONS: Current Outpatient Prescriptions  Medication Sig Dispense Refill  . buPROPion (WELLBUTRIN SR) 150 MG 12 hr tablet Take 150 mg by mouth daily.  2  . cyclobenzaprine (FLEXERIL) 5 MG tablet Take 5 mg by mouth 3 (three) times daily as needed.  0  . lamoTRIgine (LAMICTAL) 100 MG tablet 1/2 po bid xone week, then one po bid 60 tablet 11  . Multiple Vitamin (MULTIVITAMIN) capsule Take 1 capsule by mouth daily.    Marland Kitchen omega-3 acid ethyl esters (LOVAZA) 1 g capsule Take 1 capsule (1 g total) by mouth daily. 30 capsule 6  . oxyCODONE (OXY IR/ROXICODONE) 5 MG immediate release tablet Take 5 mg by mouth as needed.  0  . SUMAtriptan (IMITREX) 20 MG/ACT nasal spray Place 1 spray (20 mg total) into the nose once. May repeat in 2 hours if headache persists or recurs. 12 Inhaler 6   No current facility-administered medications for this visit.    PAST MEDICAL HISTORY: Past Medical History  Diagnosis Date  . Seasonal allergies   . GERD (gastroesophageal reflux disease)   . IBS (irritable bowel syndrome)   . Adenomatous colon polyp 2008  . Constipation   . Sciatica   . Hematochezia   . Internal hemorrhoids   . 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 Complication of anesthesia     "didn't wake up well/remembered stuff from during OR when I had pituitary surgery"  . DVT (deep vein thrombosis) in pregnancy 09/2013    "LLE; postpartum"  . Sleep apnea     "don't currently use CPAP" (07/28/2014)  . Prolactin secreting pituitary adenoma ([place]) 1999  . H/O hiatal hernia   . Migraines     "couple times/wk" (07/28/2014)  . Arthritis     "jaw joints; neck" (07/28/2014)  . Fibromyalgia   . Depression     hx  . Anxiety 1999/2000  . Panic disorder 1999/2000    PAST SURGICAL HISTORY: Past Surgical History  Procedure Laterality Date  .  Transphenoidal / transnasal hypophysectomy / resection pituitary tumor  1999    resection of benign pituitary tumor, [facility]  . Colonoscopy  01/03/2007    Dr. Silvano Rusk  . Esophagogastroduodenoscopy  09/29/2004    Dr. Kennedy Bucker  . Uterine fibroid surgery  2008  . Appendectomy  07/28/2014  . Laparoscopic appendectomy N/A 07/28/2014    Procedure: APPENDECTOMY LAPAROSCOPIC;  Surgeon: Autumn Messing III, MD;  Location: [facility] OR;  Service: General;  Laterality: N/A;    FAMILY HISTORY: Family History  Problem Relation Age of Onset  . Heart disease Mother   . Diabetes Mother   . Heart disease Father   . Prostate cancer Father   . Heart disease    . Prostate cancer    . Diabetes    . Colon cancer Maternal Grandmother   . Alzheimer's disease Father     SOCIAL HISTORY:  Social History   Social History  . Marital Status: Married    Spouse Name: N/A  . Number of Children: 3  . Years of Education: College   Occupational History  . Homemaker    Social History Main Topics  . Smoking status: Never Smoker   .  Smokeless tobacco: Never Used  . Alcohol Use: 0.6 oz/week    1 Glasses of wine per week     Comment: Rarely - less than one drink every few months  . Drug Use: No  . Sexual Activity: Yes   Other Topics Concern  . Not on file   Social History Narrative   Lives at home with her husband and three children.   Right-handed.   Rare caffeine use.     PHYSICAL EXAM   There were no vitals filed for this visit.  Not recorded      There is no weight on file to calculate BMI.  PHYSICAL EXAMNIATION:  Gen: NAD, conversant, well nourised, obese, well groomed                     Cardiovascular: Regular rate rhythm, no peripheral edema, warm, nontender. Eyes: Conjunctivae clear without exudates or hemorrhage Neck: Supple, no carotid bruise. Pulmonary: Clear to auscultation bilaterally   NEUROLOGICAL EXAM:  MENTAL STATUS: Speech:    Speech is normal; fluent and  spontaneous with normal comprehension.  Cognition:     Orientation to time, place and person     Normal recent and remote memory     Normal Attention span and concentration     Normal Language, naming, repeating,spontaneous speech     Fund of knowledge   CRANIAL NERVES: CN II: Visual fields are full to confrontation. Fundoscopic exam is normal with sharp discs and no vascular changes. Pupils are round equal and briskly reactive to light. CN III, IV, VI: extraocular movement are normal. No ptosis. CN V: Facial sensation is intact to pinprick in all 3 divisions bilaterally. Corneal responses are intact.  CN VII: Face is symmetric with normal eye closure and smile. CN VIII: Hearing is normal to rubbing fingers CN IX, X: Palate elevates symmetrically. Phonation is normal. CN XI: Head turning and shoulder shrug are intact CN XII: Tongue is midline with normal movements and no atrophy.  MOTOR: There is no pronator drift of out-stretched arms. Muscle bulk and tone are normal. Muscle strength is normal.  REFLEXES: Reflexes are 2+ and symmetric at the biceps, triceps, knees, and ankles. Plantar responses are flexor.  SENSORY: Intact to light touch, pinprick, position sense, and vibration sense are intact in fingers and toes.  COORDINATION: Rapid alternating movements and fine finger movements are intact. There is no dysmetria on finger-to-nose and heel-knee-shin.    GAIT/STANCE: Posture is normal. Gait is steady with normal steps, base, arm swing, and turning. Heel and toe walking are normal. Tandem gait is normal.  Romberg is absent.   DIAGNOSTIC DATA (LABS, IMAGING, TESTING) - I reviewed patient records, labs, notes, testing and imaging myself where available.   ASSESSMENT AND PLAN  [name] [name] is a 40 y.o. female    Chronic migraine headaches  Tried and failed multiple different preventive medications in the past, Topamax, nortriptyline, Inderal, gabapentin, Lyrica, verapmil  could not tolerate multiple  medications due to side effect  Keep lamotrigine titrating to 100 mg twice a day as preventive medications   Continue Imitrex nasal spray as needed, and NSAIDs as needed for abortive treatment  Will try BOTOX injection through other [facility] program  Depression History of pituitary adenoma,  status post transsphenoidal resection  Repeat MRI of the brain showed postsurgical change, with normal pituitary gland, small amount of small vessel disease consistent with her history of chronic migraine  BOTOX injection was performed according to protocol  by [facility]. 100 units of BOTOX was dissolved into 2 cc NS.    Used total 200 units   Corrugator 2 sites, 10 units Procerus 1 site, 5 unit Frontalis 4 sites,  20 units, Temporalis 8 sites,  40 units  Occipitalis 6 sites, 30 units Cervical Paraspinal, 4 sites, 20 units Trapezius, 6 sites, 30 units  Extra 45 units were injected into bilateral masseters muscle, 15 units in each, bilateral frontal skull, right side 10 units, left side 5 units  Patient tolerate the injection well. Will return for repeat injection in 3 months. May consider lower dose of BOTO A 100 units  Marcial Pacas, M.D. Ph.D.  [facility] [address], [place], [place] [address] Ph: [phone] Fax: [phone]  CC: Sandi Mariscal

## 2016-05-15 ENCOUNTER — Telehealth: Payer: Self-pay | Admitting: Neurology

## 2016-05-15 NOTE — Telephone Encounter (Signed)
Reports an improvement in migraines w/ after receiving Botox on 03/14/16.  She did have some post-injection neck pain and weakness for a couple of weeks but these symptoms have now improved.  She has her next pending appt for Botox on 06/21/16.

## 2016-05-15 NOTE — Telephone Encounter (Signed)
Left message for a return call

## 2016-05-15 NOTE — Telephone Encounter (Signed)
Patient called to give update from last BOTOX injection. PAtient states front injection died well for migraine, neck did not, states she had neck pain and decreased movement for a couple of weeks.

## 2016-06-21 ENCOUNTER — Ambulatory Visit (INDEPENDENT_AMBULATORY_CARE_PROVIDER_SITE_OTHER): Payer: Medicare Other | Admitting: Neurology

## 2016-06-21 ENCOUNTER — Encounter: Payer: Self-pay | Admitting: Neurology

## 2016-06-21 VITALS — BP 103/61 | HR 103 | Ht 66.0 in | Wt 158.0 lb

## 2016-06-21 DIAGNOSIS — D352 Benign neoplasm of pituitary gland: Secondary | ICD-10-CM | POA: Diagnosis not present

## 2016-06-21 DIAGNOSIS — G43719 Chronic migraine without aura, intractable, without status migrainosus: Secondary | ICD-10-CM | POA: Diagnosis not present

## 2016-06-21 DIAGNOSIS — S80862A Insect bite (nonvenomous), left lower leg, initial encounter: Secondary | ICD-10-CM | POA: Insufficient documentation

## 2016-06-21 DIAGNOSIS — S80862D Insect bite (nonvenomous), left lower leg, subsequent encounter: Secondary | ICD-10-CM

## 2016-06-21 DIAGNOSIS — R52 Pain, unspecified: Secondary | ICD-10-CM | POA: Diagnosis not present

## 2016-06-21 DIAGNOSIS — W57XXXD Bitten or stung by nonvenomous insect and other nonvenomous arthropods, subsequent encounter: Secondary | ICD-10-CM

## 2016-06-21 DIAGNOSIS — W57XXXA Bitten or stung by nonvenomous insect and other nonvenomous arthropods, initial encounter: Secondary | ICD-10-CM

## 2016-06-21 DIAGNOSIS — R5382 Chronic fatigue, unspecified: Secondary | ICD-10-CM | POA: Diagnosis not present

## 2016-06-21 MED ORDER — ONABOTULINUMTOXINA 100 UNITS IJ SOLR
100.0000 [IU] | Freq: Once | INTRAMUSCULAR | Status: AC
  Administered 2016-06-21: 100 [IU] via INTRAMUSCULAR

## 2016-06-21 NOTE — Progress Notes (Signed)
**  Botox 100 units x 1 vial, Lot [id number], Exp 11/2018, [place] [id number], office supply.**mck,rn.

## 2016-06-21 NOTE — Progress Notes (Signed)
No chief complaint on file.     PATIENT: [name] [name] DOB: [birth year]  No chief complaint on file.    HISTORICAL  [name] [name] is a 40 years old right-handed female, seen in refer by primary care physician Dr. Nancy Fetter for evaluation of frequent headaches  I reviewed and summarized office note, she had a history of pituitary adenoma, had trans-sphenoid pituitary adenoma resection surgery at [facility] in 1999, prior to the surgery, she had breast discharge, frequent migraine headache with aura, occasionally double vision, most recent MRI of the brain in record was in 2001, there was no evidence of recurrent tumor.   She was a patient of our clinic in 2008 by Dr. Gaynell Face, for chronic migraine, she was also evaluated by [facility] in past, per record, she has tried and failed multiple preventive medications, Topamax, Depakote, nortriptyline, gabapentin, Lyrica, Inderal she has difficulty tolerating the medications  magnesium oxide, riboflavin, coenzyme Q 10 the-counter medications, Relpax, also tried dietary supplement magnesium, riboflavin, coenzyme Q 10,  For abortive treatment, she also tried and failed Maxalt, Relpax, Zomig, Frova, Imitrex tablet, she is currently taking Imitrex nasal spray as needed, it helped her about 50% of the time if she take the medicine during early onset of headaches  since her most recent pregnancy in 2014, she began to have gradual worsening migraine headaches, since 2015, she has about 5-6 migraine headaches each week, trigger for her migraines are strong smells, bright light, food additives, hungry, stress, exertion, smoking  Her typical migraine are right retro-orbital area severe pounding headache with associated light noise sensitivity, nauseous, lasting one day,  For now, she was receiving Botox injection with Dr. Cyril Mourning at [facility] since 2014, which did helped her migraine, but has difficulty affording the bills  I have reviewed MRI of  lumbar reported without contrast from [facility] February 05 2015, multilevel degenerative disc disease, no significant foraminal or canal stenosis. Laboratory result August 30 first 2016, low normal TSH 0.7, normal CMP, with creatinine 0.8, vitamin D was 29.2, T3 total was within normal limits 85.5, normal CBC with hemoglobin of 12 point 3, normal PTH, free T4, UDS was negative  UPDATE Nov 03 2015: She has tried verapamil 40 mg twice a day for 3 weeks, complains of GI side effect, dizziness, is no longer taking it, it did not help her headache either. Insurance would not cover [place], she is now taking Imitrex nasal spray or tablet 100mg  1-2/week, oxycodone 5mg  prn (5 days a week), Flexeril 10mg  as needed for headaches,  We have reviewed MRI of the brain with and without contrast: Postoperative changes with in the sphenoid and sella turcica consistent with transsphenoidal pituitary microadenoma resection in the past. The pituitary gland has a normal signal, currently. There are several small subcortical T2/FLAIR hyperintense foci in the frontal lobes. This is a nonspecific finding but could be due to the patient's history of migraine and could also represent chronic microvascular ischemic change.   She still has headaches daily, couple times each week, her headache evolved to a more severe prolonged migraine headaches, her headache is usually  triggered by smells, muscle tightness at her neck and shoulder, hungry  UPDATE Mar 14 2016:  Patient has received Botox injection through: Rehabilitation Dr. [name] office for many years, reported mild neck extension weakness during her first injection around 2014, most recent injection was in March 2016, she received 160 units, denies significant side effect, she reported significant improvement of her migraine  headaches with Botox injection  UPDATE June 21 2016: Today she came in complains of whole body achy pain, under a lot of stress at home,  reported a history of tick bite in 2002 to left thigh, with bull's eye skin lesion, now concern of possibility of Lyme disease  I have personally reviewed MRI of the brain with and without contrast in Dec 2016,  1.   Postoperative changes with in the sphenoid and sella turcica consistent with transsphenoidal pituitary microadenoma resection in the past. The pituitary gland has a normal signal, currently. 2.   There are several small subcortical T2/FLAIR hyperintense foci in the frontal lobes. This is a nonspecific finding but could be due to the patient's history of migraine and could also represent chronic microvascular ischemic change. Demyelination is less likely. The brain is otherwise normal. 3.   There are no acute findings.  She also complains of fatigue, difficulty concentrating, anxiety, multiple joints pain neck pain, neck stiffness, chronic insomnia  REVIEW OF SYSTEMS: Full 14 system review of systems performed and notable only for as above   ALLERGIES: Allergies  Allergen Reactions  . Latex Rash  . Verapamil Nausea Only    Headache, dizziness    HOME MEDICATIONS: Current Outpatient Prescriptions  Medication Sig Dispense Refill  . buPROPion (WELLBUTRIN SR) 150 MG 12 hr tablet Take 150 mg by mouth daily.  2  . cyclobenzaprine (FLEXERIL) 5 MG tablet Take 5 mg by mouth 3 (three) times daily as needed.  0  . lamoTRIgine (LAMICTAL) 100 MG tablet 1/2 po bid xone week, then one po bid 60 tablet 11  . Multiple Vitamin (MULTIVITAMIN) capsule Take 1 capsule by mouth daily.    Marland Kitchen omega-3 acid ethyl esters (LOVAZA) 1 g capsule Take 1 capsule (1 g total) by mouth daily. 30 capsule 6  . oxyCODONE (OXY IR/ROXICODONE) 5 MG immediate release tablet Take 5 mg by mouth as needed.  0  . SUMAtriptan (IMITREX) 20 MG/ACT nasal spray Place 1 spray (20 mg total) into the nose once. May repeat in 2 hours if headache persists or recurs. 12 Inhaler 6   No current facility-administered medications for  this visit.    PAST MEDICAL HISTORY: Past Medical History  Diagnosis Date  . Seasonal allergies   . GERD (gastroesophageal reflux disease)   . IBS (irritable bowel syndrome)   . Adenomatous colon polyp 2008  . Constipation   . Sciatica   . Hematochezia   . Internal hemorrhoids   . 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 Complication of anesthesia     "didn't wake up well/remembered stuff from during OR when I had pituitary surgery"  . DVT (deep vein thrombosis) in pregnancy 09/2013    "LLE; postpartum"  . Sleep apnea     "don't currently use CPAP" (07/28/2014)  . Prolactin secreting pituitary adenoma ([place]) 1999  . H/O hiatal hernia   . Migraines     "couple times/wk" (07/28/2014)  . Arthritis     "jaw joints; neck" (07/28/2014)  . Fibromyalgia   . Depression     hx  . Anxiety 1999/2000  . Panic disorder 1999/2000    PAST SURGICAL HISTORY: Past Surgical History  Procedure Laterality Date  . Transphenoidal / transnasal hypophysectomy / resection pituitary tumor  1999    resection of benign pituitary tumor, [place]  . Colonoscopy  01/03/2007    Dr. Silvano Rusk  . Esophagogastroduodenoscopy  09/29/2004    Dr. Kennedy Bucker  .  Uterine fibroid surgery  2008  . Appendectomy  07/28/2014  . Laparoscopic appendectomy N/A 07/28/2014    Procedure: APPENDECTOMY LAPAROSCOPIC;  Surgeon: Autumn Messing III, MD;  Location: [facility] OR;  Service: General;  Laterality: N/A;    FAMILY HISTORY: Family History  Problem Relation Age of Onset  . Heart disease Mother   . Diabetes Mother   . Heart disease Father   . Prostate cancer Father   . Heart disease    . Prostate cancer    . Diabetes    . Colon cancer Maternal Grandmother   . Alzheimer's disease Father     SOCIAL HISTORY:  Social History   Social History  . Marital Status: Married    Spouse Name: N/A  . Number of Children: 3  . Years of Education: College   Occupational History  . Homemaker    Social History Main  Topics  . Smoking status: Never Smoker   . Smokeless tobacco: Never Used  . Alcohol Use: 0.6 oz/week    1 Glasses of wine per week     Comment: Rarely - less than one drink every few months  . Drug Use: No  . Sexual Activity: Yes   Other Topics Concern  . Not on file   Social History Narrative   Lives at home with her husband and three children.   Right-handed.   Rare caffeine use.     PHYSICAL EXAM   There were no vitals filed for this visit.  Not recorded      There is no weight on file to calculate BMI.  PHYSICAL EXAMNIATION:  Gen: NAD, conversant, well nourised, obese, well groomed                     Cardiovascular: Regular rate rhythm, no peripheral edema, warm, nontender. Eyes: Conjunctivae clear without exudates or hemorrhage Neck: Supple, no carotid bruise. Pulmonary: Clear to auscultation bilaterally   NEUROLOGICAL EXAM:  MENTAL STATUS: Speech:    Speech is normal; fluent and spontaneous with normal comprehension.  Cognition:     Orientation to time, place and person     Normal recent and remote memory     Normal Attention span and concentration     Normal Language, naming, repeating,spontaneous speech     Fund of knowledge   CRANIAL NERVES: CN II: Visual fields are full to confrontation. Fundoscopic exam is normal with sharp discs and no vascular changes. Pupils are round equal and briskly reactive to light. CN III, IV, VI: extraocular movement are normal. No ptosis. CN V: Facial sensation is intact to pinprick in all 3 divisions bilaterally. Corneal responses are intact.  CN VII: Face is symmetric with normal eye closure and smile. CN VIII: Hearing is normal to rubbing fingers CN IX, X: Palate elevates symmetrically. Phonation is normal. CN XI: Head turning and shoulder shrug are intact CN XII: Tongue is midline with normal movements and no atrophy.  MOTOR: There is no pronator drift of out-stretched arms. Muscle bulk and tone are normal. Muscle  strength is normal.  REFLEXES: Reflexes are 2+ and symmetric at the biceps, triceps, knees, and ankles. Plantar responses are flexor.  SENSORY: Intact to light touch, pinprick, position sense, and vibration sense are intact in fingers and toes.  COORDINATION: Rapid alternating movements and fine finger movements are intact. There is no dysmetria on finger-to-nose and heel-knee-shin.    GAIT/STANCE: Posture is normal. Gait is steady with normal steps, base, arm swing, and turning. Heel  and toe walking are normal. Tandem gait is normal.  Romberg is absent.   DIAGNOSTIC DATA (LABS, IMAGING, TESTING) - I reviewed patient records, labs, notes, testing and imaging myself where available.   ASSESSMENT AND PLAN  [name] [name] is a 40 y.o. female    Chronic migraine headaches  Tried and failed multiple different preventive medications in the past, Topamax, nortriptyline, Inderal, gabapentin, Lyrica, verapmil could not tolerate multiple  medications due to side effect  Keep lamotrigine titrating to 100 mg twice a day as preventive medications   Continue Imitrex nasal spray as needed, and NSAIDs as needed for abortive treatment  History of tick bite, diffuse body achy pain  Check Lyme titer,  thyroid function test   Depression History of pituitary adenoma,  status post transsphenoidal resection  Repeat MRI of the brain showed postsurgical change, with normal pituitary gland, small amount of small vessel disease consistent with her history of chronic migraine  BOTOX injection was performed according to protocol by [facility]. 100 units of BOTOX was dissolved into 2 cc NS.    Used total 100 units   Corrugator 2 sites, 10 units Procerus 1 site, 5 unit Frontalis 4 sites,  20 units, Temporalis 8 sites,  40 units  Occipitalis 5 sites, 25  units   Patient tolerate the injection well.  Will return for repeat injection in 3 months.    Marcial Pacas, M.D. Ph.D.  [facility] [address], [place], [place] [address] Ph: [phone] Fax: [phone]  CC: Sandi Mariscal

## 2016-06-22 LAB — COMPREHENSIVE METABOLIC PANEL
ALBUMIN: 5 g/dL (ref 3.5–5.5)
ALT: 10 IU/L (ref 0–32)
AST: 17 IU/L (ref 0–40)
Albumin/Globulin Ratio: 1.9 (ref 1.2–2.2)
Alkaline Phosphatase: 33 IU/L — ABNORMAL LOW (ref 39–117)
BUN / CREAT RATIO: 12 (ref 9–23)
BUN: 8 mg/dL (ref 6–24)
Bilirubin Total: 0.7 mg/dL (ref 0.0–1.2)
CO2: 25 mmol/L (ref 18–29)
CREATININE: 0.69 mg/dL (ref 0.57–1.00)
Calcium: 10.5 mg/dL — ABNORMAL HIGH (ref 8.7–10.2)
Chloride: 101 mmol/L (ref 96–106)
GFR calc non Af Amer: 109 mL/min/{1.73_m2} (ref >59)
GFR, EST AFRICAN AMERICAN: 126 mL/min/{1.73_m2} (ref >59)
GLOBULIN, TOTAL: 2.6 g/dL (ref 1.5–4.5)
GLUCOSE: 89 mg/dL (ref 65–99)
Potassium: 4.4 mmol/L (ref 3.5–5.2)
Sodium: 141 mmol/L (ref 134–144)
TOTAL PROTEIN: 7.6 g/dL (ref 6.0–8.5)

## 2016-06-22 LAB — CBC
HEMATOCRIT: 38.8 % (ref 34.0–46.6)
HEMOGLOBIN: 12.9 g/dL (ref 11.1–15.9)
MCH: 28.2 pg (ref 26.6–33.0)
MCHC: 33.2 g/dL (ref 31.5–35.7)
MCV: 85 fL (ref 79–97)
Platelets: 317 10*3/uL (ref 150–379)
RBC: 4.57 x10E6/uL (ref 3.77–5.28)
RDW: 14.5 % (ref 12.3–15.4)
WBC: 6.8 10*3/uL (ref 3.4–10.8)

## 2016-06-22 LAB — CK: CK TOTAL: 64 U/L (ref 24–173)

## 2016-06-22 LAB — SEDIMENTATION RATE: SED RATE: 2 mm/h (ref 0–32)

## 2016-06-22 LAB — TSH: TSH: 0.982 u[IU]/mL (ref 0.450–4.500)

## 2016-06-22 LAB — ANA W/REFLEX IF POSITIVE: Anti Nuclear Antibody(ANA): NEGATIVE

## 2016-06-22 LAB — C-REACTIVE PROTEIN: CRP: <0.3 mg/L (ref 0.0–4.9)

## 2016-06-22 LAB — LYME AB/WESTERN BLOT REFLEX
LYME DISEASE AB, QUANT, IGM: <0.8 index (ref 0.00–0.79)
Lyme IgG/IgM Ab: <0.91 {ISR} (ref 0.00–0.90)

## 2016-09-27 ENCOUNTER — Telehealth: Payer: Self-pay | Admitting: Neurology

## 2016-09-27 ENCOUNTER — Encounter: Payer: Self-pay | Admitting: Neurology

## 2016-09-27 ENCOUNTER — Ambulatory Visit (INDEPENDENT_AMBULATORY_CARE_PROVIDER_SITE_OTHER): Payer: Medicare Other | Admitting: Neurology

## 2016-09-27 VITALS — BP 105/71 | HR 98 | Temp 99.0°F | Ht 66.0 in | Wt 156.0 lb

## 2016-09-27 DIAGNOSIS — L74510 Primary focal hyperhidrosis, axilla: Secondary | ICD-10-CM | POA: Diagnosis not present

## 2016-09-27 DIAGNOSIS — G43719 Chronic migraine without aura, intractable, without status migrainosus: Secondary | ICD-10-CM

## 2016-09-27 MED ORDER — ONABOTULINUMTOXINA 100 UNITS IJ SOLR
200.0000 [IU] | Freq: Once | INTRAMUSCULAR | Status: AC
  Administered 2016-09-27: 200 [IU] via INTRAMUSCULAR

## 2016-09-27 NOTE — Progress Notes (Signed)
**  Botox 100 units x 2 vials, Lot [id number], Exp 03/2019, [place] [id number], office supply.//mck,rn**

## 2016-09-27 NOTE — Telephone Encounter (Signed)
Pt says she may have new ins. For 2018. She will call with that info in Jan & schedule 90 day botox for march 2018.

## 2016-09-27 NOTE — Progress Notes (Signed)
No chief complaint on file.     PATIENT: [name] [name] DOB: 01-12-1976  No chief complaint on file.    HISTORICAL  [name] [name] is a 40 years old right-handed female, seen in refer by primary care physician Dr. Nancy [name] for evaluation of frequent headaches  I reviewed and summarized office note, she had a history of pituitary adenoma, had trans-sphenoid pituitary adenoma resection surgery at [facility] in 1999, prior to the surgery, she had breast discharge, frequent migraine headache with aura, occasionally double vision, most recent MRI of the brain in record was in 2001, there was no evidence of recurrent tumor.   She was a patient of our clinic in 2008 by Dr. Gaynell [name], for chronic migraine, she was also evaluated by [facility] in past, per record, she has tried and failed multiple preventive medications, Topamax, Depakote, nortriptyline, gabapentin, Lyrica, Inderal she has difficulty tolerating the medications  magnesium oxide, riboflavin, coenzyme Q 10 the-counter medications, Relpax, also tried dietary supplement magnesium, riboflavin, coenzyme Q 10,  For abortive treatment, she also tried and failed Maxalt, Relpax, Zomig, Frova, Imitrex tablet, she is currently taking Imitrex nasal spray as needed, it helped her about 50% of the time if she take the medicine during early onset of headaches  since her most recent pregnancy in 2014, she began to have gradual worsening migraine headaches, since 2015, she has about 5-6 migraine headaches each week, trigger for her migraines are strong smells, bright light, food additives, hungry, stress, exertion, smoking  Her typical migraine are right retro-orbital area severe pounding headache with associated light noise sensitivity, nauseous, lasting one day,  For now, she was receiving Botox injection with Dr. Cyril [name] at [facility] rehabilitation since 2014, which did helped her migraine, but has difficulty affording the bills  I have reviewed MRI of  lumbar reported without contrast from [facility] February 05 2015, multilevel degenerative disc disease, no significant foraminal or canal stenosis. Laboratory result August 30 first 2016, low normal TSH 0.7, normal CMP, with creatinine 0.8, vitamin D was 29.2, T3 total was within normal limits 85.5, normal CBC with hemoglobin of 12 point 3, normal PTH, free T4, UDS was negative  UPDATE Nov 03 2015: She has tried verapamil 40 mg twice a day for 3 weeks, complains of GI side effect, dizziness, is no longer taking it, it did not help her headache either. Insurance would not cover cambia, she is now taking Imitrex nasal spray or tablet 100mg  1-2/week, oxycodone 5mg  prn (5 days a week), Flexeril 10mg  as needed for headaches,  We have reviewed MRI of the brain with and without contrast: Postoperative changes with in the sphenoid and sella turcica consistent with transsphenoidal pituitary microadenoma resection in the past. The pituitary gland has a normal signal, currently. There are several small subcortical T2/FLAIR hyperintense foci in the frontal lobes. This is a nonspecific finding but could be due to the patient's history of migraine and could also represent chronic microvascular ischemic change.   She still has headaches daily, couple times each week, her headache evolved to a more severe prolonged migraine headaches, her headache is usually  triggered by smells, muscle tightness at her neck and shoulder, hungry  UPDATE Mar 14 2016:  Patient has received Botox injection through: Rehabilitation Dr. Naaman [name] office for many years, reported mild neck extension weakness during her first injection around 2014, most recent injection was in March 2016, she received 160 units, denies significant side effect, she reported significant improvement of her migraine  headaches with Botox injection  UPDATE June 21 2016: Today she came in complains of whole body achy pain, under a lot of stress at home,  reported a history of tick bite in 2002 to left thigh, with bull's eye skin lesion, now concern of possibility of Lyme disease  I have personally reviewed MRI of the brain with and without contrast in Dec 2016,  1.   Postoperative changes with in the sphenoid and sella turcica consistent with transsphenoidal pituitary microadenoma resection in the past. The pituitary gland has a normal signal, currently. 2.   There are several small subcortical T2/FLAIR hyperintense foci in the frontal lobes. This is a nonspecific finding but could be due to the patient's history of migraine and could also represent chronic microvascular ischemic change. Demyelination is less likely. The brain is otherwise normal. 3.   There are no acute findings.  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 Latex Rash  . Verapamil Nausea Only    Headache, dizziness    HOME MEDICATIONS: Current Outpatient Prescriptions  Medication Sig Dispense Refill  . buPROPion (WELLBUTRIN SR) 150 MG 12 hr tablet Take 150 mg by mouth daily.  2  . cyclobenzaprine (FLEXERIL) 5 MG tablet Take 5 mg by mouth 3 (three) times daily as needed.  0  . lamoTRIgine (LAMICTAL) 100 MG tablet 1/2 po bid xone week, then one po bid 60 tablet 11  . Multiple Vitamin (MULTIVITAMIN) capsule Take 1 capsule by mouth daily.    Marland Kitchen omega-3 acid ethyl esters (LOVAZA) 1 g capsule Take 1 capsule (1 g total) by mouth daily. 30 capsule 6  . oxyCODONE (OXY IR/ROXICODONE) 5 MG immediate release tablet Take 5 mg by mouth as needed.  0  . SUMAtriptan  (IMITREX) 20 MG/ACT nasal spray Place 1 spray (20 mg total) into the nose once. May repeat in 2 hours if headache persists or recurs. 12 Inhaler 6   No current facility-administered medications for this visit.    PAST MEDICAL HISTORY: Past Medical History  Diagnosis Date  . Seasonal allergies   . GERD (gastroesophageal reflux disease)   . IBS (irritable bowel syndrome)   . Adenomatous colon polyp 2008  . Constipation   . Sciatica   . Hematochezia   . Internal hemorrhoids   . 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 Complication of anesthesia     "didn't wake up well/remembered stuff from during OR when I had pituitary surgery"  . DVT (deep vein thrombosis) in pregnancy 09/2013    "LLE; postpartum"  . Sleep apnea     "don't currently use CPAP" (07/28/2014)  . Prolactin secreting pituitary adenoma ([place]) 1999  . H/O hiatal hernia   . Migraines     "couple times/wk" (07/28/2014)  . Arthritis     "jaw joints; neck" (07/28/2014)  . Fibromyalgia   . Depression     hx  . Anxiety 1999/2000  . Panic disorder 1999/2000    PAST SURGICAL HISTORY: Past Surgical History  Procedure Laterality Date  . Transphenoidal / transnasal hypophysectomy / resection pituitary tumor  1999  resection of benign pituitary tumor, [facility]  . Colonoscopy  01/03/2007    Dr. Silvano Rusk  . Esophagogastroduodenoscopy  09/29/2004    Dr. Kennedy Bucker  . Uterine fibroid surgery  2008  . Appendectomy  07/28/2014  . Laparoscopic appendectomy N/A 07/28/2014    Procedure: APPENDECTOMY LAPAROSCOPIC;  Surgeon: Autumn Messing III, MD;  Location: [facility] OR;  Service: General;  Laterality: N/A;    FAMILY HISTORY: Family History  Problem Relation Age of Onset  . Heart disease Mother   . Diabetes Mother   . Heart disease Father   . Prostate cancer Father   . Heart disease    . Prostate cancer    . Diabetes    . Colon cancer Maternal Grandmother   . Alzheimer's disease Father     SOCIAL  HISTORY:  Social History   Social History  . Marital Status: Married    Spouse Name: N/A  . Number of Children: 3  . Years of Education: College   Occupational History  . Homemaker    Social History Main Topics  . Smoking status: Never Smoker   . Smokeless tobacco: Never Used  . Alcohol Use: 0.6 oz/week    1 Glasses of wine per week     Comment: Rarely - less than one drink every few months  . Drug Use: No  . Sexual Activity: Yes   Other Topics Concern  . Not on file   Social History Narrative   Lives at home with her husband and three children.   Right-handed.   Rare caffeine use.     PHYSICAL EXAM   There were no vitals filed for this visit.  Not recorded      There is no weight on file to calculate BMI.  PHYSICAL EXAMNIATION:  Gen: NAD, conversant, well nourised, obese, well groomed                     Cardiovascular: Regular rate rhythm, no peripheral edema, warm, nontender. Eyes: Conjunctivae clear without exudates or hemorrhage Neck: Supple, no carotid bruise. Pulmonary: Clear to auscultation bilaterally   NEUROLOGICAL EXAM:  MENTAL STATUS: Speech:    Speech is normal; fluent and spontaneous with normal comprehension.  Cognition:     Orientation to time, place and person     Normal recent and remote memory     Normal Attention span and concentration     Normal Language, naming, repeating,spontaneous speech     Fund of knowledge   CRANIAL NERVES: CN II: Visual fields are full to confrontation. Fundoscopic exam is normal with sharp discs and no vascular changes. Pupils are round equal and briskly reactive to light. CN III, IV, VI: extraocular movement are normal. No ptosis. CN V: Facial sensation is intact to pinprick in all 3 divisions bilaterally. Corneal responses are intact.  CN VII: [name] is symmetric with normal eye closure and smile. CN VIII: Hearing is normal to rubbing fingers CN IX, X: Palate elevates symmetrically. Phonation is  normal. CN XI: Head turning and shoulder shrug are intact CN XII: Tongue is midline with normal movements and no atrophy.  MOTOR: There is no pronator drift of out-stretched arms. Muscle bulk and tone are normal. Muscle strength is normal.  REFLEXES: Reflexes are 2+ and symmetric at the biceps, triceps, knees, and ankles. Plantar responses are flexor.  SENSORY: Intact to light touch, pinprick, position sense, and vibration sense are intact in fingers and toes.  COORDINATION: Rapid alternating movements and  fine finger movements are intact. There is no dysmetria on finger-to-nose and heel-knee-shin.    GAIT/STANCE: Posture is normal. Gait is steady with normal steps, base, arm swing, and turning. Heel and toe walking are normal. Tandem gait is normal.  Romberg is absent.   DIAGNOSTIC DATA (LABS, IMAGING, TESTING) - I reviewed patient records, labs, notes, testing and imaging myself where available.   ASSESSMENT AND PLAN  [name] [name] is a 40 y.o. female    Chronic migraine headaches  Tried and failed multiple different preventive medications in the past, Topamax, nortriptyline, Inderal, gabapentin, Lyrica, verapmil could not tolerate multiple  medications due to side effect  Keep lamotrigine titrating to 100 mg twice a day as preventive medications   Continue Imitrex nasal spray as needed, and NSAIDs as needed for abortive treatment  History of tick bite, diffuse body achy pain  Check Lyme titer,  thyroid function test   Depression History of pituitary adenoma,  status post transsphenoidal resection  Repeat MRI of the brain showed postsurgical change, with normal pituitary gland, small amount of small vessel disease consistent with her history of chronic migraine  BOTOX injection was performed according to protocol by [facility]. 100 units of BOTOX was dissolved into 2 cc NS.    Used total 200 units   Corrugator 2 sites, 10 units Procerus 1 site, 5 unit Frontalis 4  sites,  20 units, Temporalis 8 sites,  40 units  Upper trapezius 25 units  Masseters 25 units each, for total 50 units  Upper cervical region for total of 50 units  Patient tolerate the injection well.  She will return for repeat injection in 3 months.    Marcial Pacas, M.D. Ph.D.  [facility] [address], [place], [place] [address] Ph: [phone] Fax: [phone]  CC: Sandi Mariscal

## 2016-09-28 NOTE — Telephone Encounter (Signed)
Noted thank you

## 2016-10-18 ENCOUNTER — Telehealth: Payer: Self-pay | Admitting: Neurology

## 2016-10-18 NOTE — Telephone Encounter (Signed)
Pt called to advise since she was last seen for botox injection left eyelid is staying half closed all the time. Please call and touch base with her

## 2016-10-18 NOTE — Telephone Encounter (Signed)
Spoke to patient - states she is having slight drooping of her left eyelid since her Botox injection on 09/27/16.  She is aware of this being a side effect of the medication and just wanted this noted in her chart. I let her know that as the Botox wears off, her symptoms will also gradually improve.  Requested she take pictures of her eye and bring them to her next appt for Dr. Krista Blue to review.  She was agreeable to this plan. She will call back with any further questions or concerns.

## 2017-01-16 ENCOUNTER — Telehealth: Payer: Self-pay | Admitting: Neurology

## 2017-01-16 NOTE — Telephone Encounter (Signed)
Patient called office in reference to scheduling Botox injection.  If patient does not answer please leave detailed message per patient.

## 2017-01-24 NOTE — Telephone Encounter (Signed)
Patient called office to schedule Botox appointment.

## 2017-01-28 ENCOUNTER — Telehealth: Payer: Self-pay | Admitting: Neurology

## 2017-01-28 NOTE — Telephone Encounter (Signed)
I called and scheduled the patient for injections.

## 2017-01-28 NOTE — Telephone Encounter (Signed)
Pt would like for you to call her back concerning her Botox appt she just scheduled with you. Thank you.

## 2017-01-29 NOTE — Telephone Encounter (Signed)
Patient calling to see if there is another appointment for Botox besides tomorrow. She is not sure she can make the 4pm appointment, I advised her I did not see any soon appointments but she requested a call back.

## 2017-01-30 ENCOUNTER — Encounter: Payer: Self-pay | Admitting: Neurology

## 2017-01-30 ENCOUNTER — Ambulatory Visit (INDEPENDENT_AMBULATORY_CARE_PROVIDER_SITE_OTHER): Payer: Medicare Other | Admitting: Neurology

## 2017-01-30 ENCOUNTER — Telehealth: Payer: Self-pay | Admitting: Neurology

## 2017-01-30 VITALS — BP 106/72 | HR 100 | Ht 66.0 in | Wt 156.0 lb

## 2017-01-30 DIAGNOSIS — Z9889 Other specified postprocedural states: Secondary | ICD-10-CM | POA: Diagnosis not present

## 2017-01-30 DIAGNOSIS — R9089 Other abnormal findings on diagnostic imaging of central nervous system: Secondary | ICD-10-CM | POA: Insufficient documentation

## 2017-01-30 DIAGNOSIS — L74510 Primary focal hyperhidrosis, axilla: Secondary | ICD-10-CM | POA: Diagnosis not present

## 2017-01-30 DIAGNOSIS — G43719 Chronic migraine without aura, intractable, without status migrainosus: Secondary | ICD-10-CM

## 2017-01-30 MED ORDER — SUMATRIPTAN SUCCINATE 6 MG/0.5ML ~~LOC~~ SOLN: 6.0000 mg | SUBCUTANEOUS | 11 refills | Status: DC | PRN

## 2017-01-30 MED ORDER — DICLOFENAC POTASSIUM(MIGRAINE) 50 MG PO PACK: 50.0000 mg | PACK | Freq: Every day | ORAL | 11 refills | Status: DC | PRN

## 2017-01-30 MED ORDER — RIZATRIPTAN BENZOATE 10 MG PO TBDP: 10.0000 mg | ORAL_TABLET | ORAL | 6 refills | Status: DC | PRN

## 2017-01-30 MED ORDER — ONDANSETRON 4 MG PO TBDP: 4.0000 mg | ORAL_TABLET | Freq: Three times a day (TID) | ORAL | 6 refills | Status: DC | PRN

## 2017-01-30 NOTE — Telephone Encounter (Signed)
Per dr. Krista Blue, pt needs botox in 3 mos.

## 2017-01-30 NOTE — Progress Notes (Signed)
**  Botox 100 units x 2 vials, NDC 5400-8676-19, Lot [id number], Exp 08/2019, office supply.//mck,[email or web address]**

## 2017-01-30 NOTE — Progress Notes (Signed)
No chief complaint on file.     PATIENT: [name] [name] DOB: [birth year]    HISTORICAL  [name] [name] is a 41 years old right-handed female, seen in refer by primary care physician Dr. Nancy Fetter for evaluation of frequent headaches  I reviewed and summarized office note, she had a history of pituitary adenoma, had trans-sphenoid pituitary adenoma resection surgery at [facility] in 1999, prior to the surgery, she had breast discharge, frequent migraine headache with aura, occasionally double vision, most recent MRI of the brain in record was in 2001, there was no evidence of recurrent tumor.   She was a patient of our clinic in 2008 by Dr. Gaynell Face, for chronic migraine, she was also evaluated by [facility] in past, per record, she has tried and failed multiple preventive medications, Topamax, Depakote, nortriptyline, gabapentin, Lyrica, Inderal she has difficulty tolerating the medications  magnesium oxide, riboflavin, coenzyme Q 10 the-counter medications, Relpax, also tried dietary supplement magnesium, riboflavin, coenzyme Q 10,  For abortive treatment, she also tried and failed Maxalt, Relpax, Zomig, Frova, Imitrex tablet, she is currently taking Imitrex nasal spray as needed, it helped her about 50% of the time if she take the medicine during early onset of headaches  since her most recent pregnancy in 2014, she began to have gradual worsening migraine headaches, since 2015, she has about 5-6 migraine headaches each week, trigger for her migraines are strong smells, bright light, food additives, hungry, stress, exertion, smoking  Her typical migraine are right retro-orbital area severe pounding headache with associated light noise sensitivity, nauseous, lasting one day,  For now, she was receiving Botox injection with Dr. [name] at [facility] since 2014, which did helped her migraine, but has difficulty affording the bills  I have reviewed MRI of lumbar reported without contrast  from [facility] February 05 2015, multilevel degenerative disc disease, no significant foraminal or canal stenosis. Laboratory result August 30 first 2016, low normal TSH 0.7, normal CMP, with creatinine 0.8, vitamin D was 29.2, T3 total was within normal limits 85.5, normal CBC with hemoglobin of 12 point 3, normal PTH, free T4, UDS was negative  UPDATE Nov 03 2015: She has tried verapamil 40 mg twice a day for 3 weeks, complains of GI side effect, dizziness, is no longer taking it, it did not help her headache either. Insurance would not cover [place], she is now taking Imitrex nasal spray or tablet 100mg  1-2/week, oxycodone 5mg  prn (5 days a week), Flexeril 10mg  as needed for headaches,  We have reviewed MRI of the brain with and without contrast: Postoperative changes with in the sphenoid and sella turcica consistent with transsphenoidal pituitary microadenoma resection in the past. The pituitary gland has a normal signal, currently. There are several small subcortical T2/FLAIR hyperintense foci in the frontal lobes. This is a nonspecific finding but could be due to the patient's history of migraine and could also represent chronic microvascular ischemic change.   She still has headaches daily, couple times each week, her headache evolved to a more severe prolonged migraine headaches, her headache is usually  triggered by smells, muscle tightness at her neck and shoulder, hungry  UPDATE Mar 14 2016:  Patient has received Botox injection through: Rehabilitation Dr. [name] office for many years, reported mild neck extension weakness during her first injection around 2014, most recent injection was in March 2016, she received 160 units, denies significant side effect, she reported significant improvement of her migraine headaches with Botox injection  UPDATE  June 21 2016: Today she came in complains of whole body achy pain, under a lot of stress at home, reported a history of tick bite in 2002  to left thigh, with bull's eye skin lesion, now concern of possibility of Lyme disease  I have personally reviewed MRI of the brain with and without contrast in Dec 2016,  1.   Postoperative changes with in the sphenoid and sella turcica consistent with transsphenoidal pituitary microadenoma resection in the past. The pituitary gland has a normal signal, currently. 2.   There are several small subcortical T2/FLAIR hyperintense foci in the frontal lobes. This is a nonspecific finding but could be due to the patient's history of migraine and could also represent chronic microvascular ischemic change. Demyelination is less likely. The brain is otherwise normal. 3.   There are no acute findings.  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   Postoperative changes with in the sphenoid and sella turcica consistent with transsphenoidal pituitary microadenoma resection in the past. The pituitary gland has a normal signal, currently. 2.   There are several small subcortical T2/FLAIR hyperintense foci in the frontal lobes. This is a nonspecific finding but could be  due to the patient's history of migraine and could also represent chronic microvascular ischemic change. Demyelination is less likely. The brain is otherwise normal. 3.   There are no acute findings.  We went over the possible medication treatment in detail,  REVIEW OF SYSTEMS: Full 14 system review of systems performed and notable only for as above   ALLERGIES: Allergies  Allergen Reactions  . Latex Rash  . Verapamil Nausea Only    Headache, dizziness    HOME MEDICATIONS: Current Outpatient Prescriptions  Medication Sig Dispense Refill  . buPROPion (WELLBUTRIN SR) 150 MG 12 hr tablet Take 150 mg by mouth daily.  2  . cyclobenzaprine (FLEXERIL) 5 MG tablet Take 5 mg by mouth 3 (three) times daily as needed.  0  . lamoTRIgine (LAMICTAL) 100 MG tablet 1/2 po bid xone week, then one po bid 60 tablet 11  . Multiple Vitamin (MULTIVITAMIN) capsule Take 1 capsule by mouth daily.    Marland Kitchen omega-3 acid ethyl esters (LOVAZA) 1 g capsule Take 1 capsule (1 g total) by mouth daily. 30 capsule 6  . oxyCODONE (OXY IR/ROXICODONE) 5 MG immediate release tablet Take 5 mg by mouth as needed.  0  . SUMAtriptan (IMITREX) 20 MG/ACT nasal spray Place 1 spray (20 mg total) into the nose once. May repeat in 2 hours if headache persists or recurs. 12 Inhaler 6   No current facility-administered medications for this visit.    PAST MEDICAL HISTORY:  Past Medical History  Diagnosis Date  . Seasonal allergies   . GERD (gastroesophageal reflux disease)   . IBS (irritable bowel syndrome)   . Adenomatous colon polyp 2008  . Constipation   . Sciatica   . Hematochezia   . Internal hemorrhoids   . 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 Complication of anesthesia     "didn't wake up well/remembered stuff from during OR when I had pituitary surgery"  . DVT (deep vein thrombosis) in pregnancy 09/2013    "LLE; postpartum"  . Sleep apnea     "don't currently use CPAP" (07/28/2014)  . Prolactin secreting  pituitary adenoma ([place]) 1999  . H/O hiatal hernia   . Migraines     "couple times/wk" (07/28/2014)  . Arthritis     "jaw joints; neck" (07/28/2014)  . Fibromyalgia   . Depression     hx  . Anxiety 1999/2000  . Panic disorder 1999/2000    PAST SURGICAL HISTORY: Past Surgical History  Procedure Laterality Date  . Transphenoidal / transnasal hypophysectomy / resection pituitary tumor  1999    resection of benign pituitary tumor, [facility]  . Colonoscopy  01/03/2007    Dr. Silvano Rusk  . Esophagogastroduodenoscopy  09/29/2004    Dr. Kennedy Bucker  . Uterine fibroid surgery  2008  . Appendectomy  07/28/2014  . Laparoscopic appendectomy N/A 07/28/2014    Procedure: APPENDECTOMY LAPAROSCOPIC;  Surgeon: Autumn Messing III, MD;  Location: [facility] OR;  Service: General;  Laterality: N/A;    FAMILY HISTORY: Family History  Problem Relation Age of Onset  . Heart disease Mother   . Diabetes Mother   . Heart disease Father   . Prostate cancer Father   . Heart disease    . Prostate cancer    . Diabetes    . Colon cancer Maternal Grandmother   . Alzheimer's disease Father     SOCIAL HISTORY:  Social History   Social History  . Marital Status: Married    Spouse Name: N/A  . Number of Children: 3  . Years of Education: College   Occupational History  . Homemaker    Social History Main Topics  . Smoking status: Never Smoker   . Smokeless tobacco: Never Used  . Alcohol Use: 0.6 oz/week    1 Glasses of wine per week     Comment: Rarely - less than one drink every few months  . Drug Use: No  . Sexual Activity: Yes   Other Topics Concern  . Not on file   Social History Narrative   Lives at home with her husband and three children.   Right-handed.   Rare caffeine use.     PHYSICAL EXAM   There were no vitals filed for this visit.  Not recorded      There is no weight on file to calculate BMI.  PHYSICAL EXAMNIATION:  Gen: NAD, conversant, well nourised, obese,  well groomed                     Cardiovascular: Regular rate rhythm, no peripheral edema, warm, nontender. Eyes: Conjunctivae clear without exudates or hemorrhage Neck: Supple, no carotid bruise. Pulmonary: Clear to auscultation bilaterally   NEUROLOGICAL EXAM:  MENTAL STATUS: Speech:    Speech is normal; fluent and spontaneous with normal comprehension.  Cognition:     Orientation to time, place and person     Normal recent and remote memory     Normal  Attention span and concentration     Normal Language, naming, repeating,spontaneous speech     Fund of knowledge   CRANIAL NERVES: CN II: Visual fields are full to confrontation. Fundoscopic exam is normal with sharp discs and no vascular changes. Pupils are round equal and briskly reactive to light. CN III, IV, VI: extraocular movement are normal. No ptosis. CN V: Facial sensation is intact to pinprick in all 3 divisions bilaterally. Corneal responses are intact.  CN VII: Face is symmetric with normal eye closure and smile. CN VIII: Hearing is normal to rubbing fingers CN IX, X: Palate elevates symmetrically. Phonation is normal. CN XI: Head turning and shoulder shrug are intact CN XII: Tongue is midline with normal movements and no atrophy.  MOTOR: There is no pronator drift of out-stretched arms. Muscle bulk and tone are normal. Muscle strength is normal.  REFLEXES: Reflexes are 2+ and symmetric at the biceps, triceps, knees, and ankles. Plantar responses are flexor.  SENSORY: Intact to light touch, pinprick, position sense, and vibration sense are intact in fingers and toes.  COORDINATION: Rapid alternating movements and fine finger movements are intact. There is no dysmetria on finger-to-nose and heel-knee-shin.    GAIT/STANCE: Posture is normal. Gait is steady with normal steps, base, arm swing, and turning. Heel and toe walking are normal. Tandem gait is normal.  Romberg is absent.   DIAGNOSTIC DATA (LABS, IMAGING,  TESTING) - I reviewed patient records, labs, notes, testing and imaging myself where available.   ASSESSMENT AND PLAN  [name] [name] is a 41 y.o. female    Chronic migraine headaches  Tried and failed multiple different preventive medications in the past, Topamax, lamotrigine nortriptyline, Inderal, gabapentin, Lyrica, verapmil could not tolerate multiple medications due to side effect  Keep lamotrigine titrating to 100 mg twice a day as preventive medications   We will try Maxalt dissolvable, plus Zofran, plus Aleve, plus Flexeril as needed for moderate migraine  Imitrex subcutaneous injection for more severe migraine  Depression History of pituitary adenoma,  status post transsphenoidal resection  Repeat MRI of the brain in December 2016 showed postsurgical change, with normal pituitary gland, small amount of small vessel disease consistent with her history of chronic migraine  BOTOX injection was performed according to protocol by [employer]. 100 units of BOTOX was dissolved into 2 cc NS.    BOTOX injection was performed according to protocol by [employer]. 100 units of BOTOX was dissolved into 2 cc NS.     Corrugator 2 sites, 10 units Procerus 1 site, 5 unit Frontalis 4 sites,  20 units, Temporalis 8 sites,  40 units  Occipitalis 6 sites, 30 units Cervical Paraspinal, 4 sites, 20 units Trapezius, 6 sites, 30 units  Bilateral masseters muscle 15 units each, extra 15 units were injected along bilateral upper cervical paraspinal muscles.  Patient tolerate the injection well. Will return for repeat injection in 3 months.  Patient tolerate the injection well.  She will return for repeat injection in 3 months.    Marcial Pacas, M.D. Ph.D.  [facility] [address] [place], [place] [address] Ph: [phone] Fax: [phone]  CC: Sandi Mariscal

## 2017-02-01 NOTE — Telephone Encounter (Signed)
Patient kept original apt.

## 2017-02-12 NOTE — Telephone Encounter (Signed)
I called the patient she did not answer so I left a VM asking her to call me back.

## 2017-03-04 ENCOUNTER — Other Ambulatory Visit: Payer: Self-pay | Admitting: Obstetrics and Gynecology

## 2017-03-04 DIAGNOSIS — R928 Other abnormal and inconclusive findings on diagnostic imaging of breast: Secondary | ICD-10-CM

## 2017-03-06 ENCOUNTER — Other Ambulatory Visit: Payer: Self-pay

## 2017-03-20 ENCOUNTER — Ambulatory Visit
Admission: RE | Admit: 2017-03-20 | Discharge: 2017-03-20 | Disposition: A | Discharge status: Home / Self Care | Payer: Medicare Other | Source: Ambulatory Visit | Attending: Obstetrics and Gynecology | Admitting: Obstetrics and Gynecology

## 2017-03-20 DIAGNOSIS — R928 Other abnormal and inconclusive findings on diagnostic imaging of breast: Secondary | ICD-10-CM

## 2017-05-21 ENCOUNTER — Encounter: Payer: Medicare Other | Admitting: Vascular Surgery

## 2017-05-21 NOTE — Telephone Encounter (Signed)
Patient called and requested I call [phone] regarding her botox injection medication and request an exception for the medication.

## 2017-05-29 ENCOUNTER — Telehealth: Payer: Self-pay | Admitting: *Deleted

## 2017-05-29 NOTE — Telephone Encounter (Signed)
Called and spoke with Jinny Blossom from [employer]. Advised we received fax stating patient insurance only covers tablet form of sumatriptan, not nasal spray. I advised patient given sumatriptan inj, not nasal spray. She ran rx thru her insurance for injection and was able to get claim via insurance. Nothing further needed at this time.  She asked [place] to disregard fax sent to our office.

## 2017-08-29 ENCOUNTER — Telehealth: Payer: Self-pay | Admitting: Neurology

## 2017-08-29 NOTE — Telephone Encounter (Signed)
LMVM for pt that from last ovf note, looks like sumatriptan inj was ordered for her per Dr. Krista Blue.  Per her insurance did approve injectable.  Let me know if something different.

## 2017-08-29 NOTE — Telephone Encounter (Signed)
I scheduled patient Botox appointment for 10/23/17 but she stated she would like a call back if she can have a sooner appointment.

## 2017-08-29 NOTE — Telephone Encounter (Signed)
I spoke with the patient regarding her injections, she would like to know if there is any time earlier that she can come in for her apt. I told her I would add her to the waitlist. She would also like to speak with the nurse regarding her imitrex nose spray. She is having some issues getting it covered with insurance. Please call and advise.

## 2017-10-23 ENCOUNTER — Ambulatory Visit (INDEPENDENT_AMBULATORY_CARE_PROVIDER_SITE_OTHER): Payer: Medicare Other | Admitting: Neurology

## 2017-10-23 ENCOUNTER — Encounter: Payer: Self-pay | Admitting: Neurology

## 2017-10-23 ENCOUNTER — Other Ambulatory Visit: Payer: Self-pay | Admitting: *Deleted

## 2017-10-23 VITALS — BP 122/73 | HR 91 | Ht 66.0 in | Wt 166.0 lb

## 2017-10-23 DIAGNOSIS — G43719 Chronic migraine without aura, intractable, without status migrainosus: Secondary | ICD-10-CM | POA: Diagnosis not present

## 2017-10-23 DIAGNOSIS — F411 Generalized anxiety disorder: Secondary | ICD-10-CM

## 2017-10-23 DIAGNOSIS — F902 Attention-deficit hyperactivity disorder, combined type: Secondary | ICD-10-CM

## 2017-10-23 DIAGNOSIS — R5383 Other fatigue: Secondary | ICD-10-CM

## 2017-10-23 MED ORDER — ONABOTULINUMTOXINA 100 UNITS IJ SOLR
200.0000 [IU] | Freq: Once | INTRAMUSCULAR | Status: AC
Start: 2017-10-23 — End: 2017-10-23
  Administered 2017-10-23: 200 [IU] via INTRAMUSCULAR

## 2017-10-23 MED ORDER — DICLOFENAC POTASSIUM(MIGRAINE) 50 MG PO PACK: 50.0000 mg | PACK | Freq: Every day | ORAL | 11 refills | Status: DC | PRN

## 2017-10-23 MED ORDER — SUMATRIPTAN SUCCINATE 100 MG PO TABS: 100.0000 mg | ORAL_TABLET | Freq: Once | ORAL | 11 refills | Status: DC | PRN

## 2017-10-23 NOTE — Progress Notes (Signed)
**  Botox 100 units x 2 vials, NDC 9847-3085-69, Lot [id number], Exp 03/2020, office supply.//mck,rn**

## 2017-10-23 NOTE — Progress Notes (Signed)
No chief complaint on file.     PATIENT: [name] [name] DOB: [birth year]    HISTORICAL  [name] [name] is a 42 years old right-handed female, seen in refer by primary care physician Dr. Nancy Fetter for evaluation of frequent headaches  I reviewed and summarized office note, she had a history of pituitary adenoma, had trans-sphenoid pituitary adenoma resection surgery at [facility] in 1999, prior to the surgery, she had breast discharge, frequent migraine headache with aura, occasionally double vision, most recent MRI of the brain in record was in 2001, there was no evidence of recurrent tumor.   She was a patient of our clinic in 2008 by Dr. Gaynell Face, for chronic migraine, she was also evaluated by [facility] in past, per record, she has tried and failed multiple preventive medications, Topamax, Depakote, nortriptyline, gabapentin, Lyrica, Inderal she has difficulty tolerating the medications  magnesium oxide, riboflavin, coenzyme Q 10 the-counter medications, Relpax, also tried dietary supplement magnesium, riboflavin, coenzyme Q 10,  For abortive treatment, she also tried and failed Maxalt, Relpax, Zomig, Frova, Imitrex tablet, she is currently taking Imitrex nasal spray as needed, it helped her about 50% of the time if she take the medicine during early onset of headaches  since her most recent pregnancy in 2014, she began to have gradual worsening migraine headaches, since 2015, she has about 5-6 migraine headaches each week, trigger for her migraines are strong smells, bright light, food additives, hungry, stress, exertion, smoking  Her typical migraine are right retro-orbital area severe pounding headache with associated light noise sensitivity, nauseous, lasting one day,  For now, she was receiving Botox injection with Dr. [name] at [facility] since 2014, which did helped her migraine, but has difficulty affording the bills  I have reviewed MRI of lumbar reported without contrast  from [facility] February 05 2015, multilevel degenerative disc disease, no significant foraminal or canal stenosis. Laboratory result August 30 first 2016, low normal TSH 0.7, normal CMP, with creatinine 0.8, vitamin D was 29.2, T3 total was within normal limits 85.5, normal CBC with hemoglobin of 12 point 3, normal PTH, free T4, UDS was negative  UPDATE Nov 03 2015: She has tried verapamil 40 mg twice a day for 3 weeks, complains of GI side effect, dizziness, is no longer taking it, it did not help her headache either. Insurance would not cover cambia, she is now taking Imitrex nasal spray or tablet 100mg  1-2/week, oxycodone 5mg  prn (5 days a week), Flexeril 10mg  as needed for headaches,  We have reviewed MRI of the brain with and without contrast: Postoperative changes with in the sphenoid and sella turcica consistent with transsphenoidal pituitary microadenoma resection in the past. The pituitary gland has a normal signal, currently. There are several small subcortical T2/FLAIR hyperintense foci in the frontal lobes. This is a nonspecific finding but could be due to the patient's history of migraine and could also represent chronic microvascular ischemic change.   She still has headaches daily, couple times each week, her headache evolved to a more severe prolonged migraine headaches, her headache is usually  triggered by smells, muscle tightness at her neck and shoulder, hungry  UPDATE Mar 14 2016:  Patient has received Botox injection through: Rehabilitation Dr. [name] office for many years, reported mild neck extension weakness during her first injection around 2014, most recent injection was in March 2016, she received 160 units, denies significant side effect, she reported significant improvement of her migraine headaches with Botox injection  UPDATE  June 21 2016: Today she came in complains of whole body achy pain, under a lot of stress at home, reported a history of tick bite in 2002  to left thigh, with bull's eye skin lesion, now concern of possibility of Lyme disease  I have personally reviewed MRI of the brain with and without contrast in Dec 2016,  1.   Postoperative changes with in the sphenoid and sella turcica consistent with transsphenoidal pituitary microadenoma resection in the past. The pituitary gland has a normal signal, currently. 2.   There are several small subcortical T2/FLAIR hyperintense foci in the frontal lobes. This is a nonspecific finding but could be due to the patient's history of migraine and could also represent chronic microvascular ischemic change. Demyelination is less likely. The brain is otherwise normal. 3.   There are no acute findings.  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   Postoperative changes with in the sphenoid and sella turcica consistent with transsphenoidal pituitary microadenoma resection in the past. The pituitary gland has a normal signal, currently. 2.   There are several small subcortical T2/FLAIR hyperintense foci in the frontal lobes. This is a nonspecific finding but could be  due to the patient's history of migraine and could also represent chronic microvascular ischemic change. Demyelination is less likely. The brain is otherwise normal. 3.   There are no acute findings.  We went over the possible medication treatment in detail,  UPDATE Oct 23 2017: Last injection was in April 2018, she responded very well, because of the insurance reasons, has such delay of repeat Botox injection, also complains of generalized fatigue, difficulty focusing,  REVIEW OF SYSTEMS: Full 14 system review of systems performed and notable only for as above   ALLERGIES: Allergies  Allergen Reactions  . Latex Rash  . Verapamil Nausea Only    Headache, dizziness    HOME MEDICATIONS: Current Outpatient Prescriptions  Medication Sig Dispense Refill  . buPROPion (WELLBUTRIN SR) 150 MG 12 hr tablet Take 150 mg by mouth daily.  2  . cyclobenzaprine (FLEXERIL) 5 MG tablet Take 5 mg by mouth 3 (three) times daily as needed.  0  . lamoTRIgine (LAMICTAL) 100 MG tablet 1/2 po bid xone week, then one po bid 60 tablet 11  . Multiple Vitamin (MULTIVITAMIN) capsule Take 1 capsule by mouth daily.    Marland Kitchen omega-3 acid ethyl esters (LOVAZA) 1 g capsule Take 1 capsule (1 g total) by mouth daily. 30 capsule 6  . oxyCODONE (OXY IR/ROXICODONE) 5 MG immediate release tablet Take 5 mg by mouth as needed.  0  . SUMAtriptan (IMITREX) 20 MG/ACT nasal spray Place 1 spray (20  mg total) into the nose once. May repeat in 2 hours if headache persists or recurs. 12 Inhaler 6   No current facility-administered medications for this visit.    PAST MEDICAL HISTORY: Past Medical History  Diagnosis Date  . Seasonal allergies   . GERD (gastroesophageal reflux disease)   . IBS (irritable bowel syndrome)   . Adenomatous colon polyp 2008  . Constipation   . Sciatica   . Hematochezia   . Internal hemorrhoids   . 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 Complication of anesthesia     "didn't wake up well/remembered  stuff from during OR when I had pituitary surgery"  . DVT (deep vein thrombosis) in pregnancy 09/2013    "LLE; postpartum"  . Sleep apnea     "don't currently use CPAP" (07/28/2014)  . Prolactin secreting pituitary adenoma ([place]) 1999  . H/O hiatal hernia   . Migraines     "couple times/wk" (07/28/2014)  . Arthritis     "jaw joints; neck" (07/28/2014)  . Fibromyalgia   . Depression     hx  . Anxiety 1999/2000  . Panic disorder 1999/2000    PAST SURGICAL HISTORY: Past Surgical History  Procedure Laterality Date  . Transphenoidal / transnasal hypophysectomy / resection pituitary tumor  1999    resection of benign pituitary tumor, [facility]  . Colonoscopy  01/03/2007    Dr. Silvano Rusk  . Esophagogastroduodenoscopy  09/29/2004    Dr. Kennedy Bucker  . Uterine fibroid surgery  2008  . Appendectomy  07/28/2014  . Laparoscopic appendectomy N/A 07/28/2014    Procedure: APPENDECTOMY LAPAROSCOPIC;  Surgeon: Autumn Messing III, MD;  Location: [facility] OR;  Service: General;  Laterality: N/A;    FAMILY HISTORY: Family History  Problem Relation Age of Onset  . Heart disease Mother   . Diabetes Mother   . Heart disease Father   . Prostate cancer Father   . Heart disease    . Prostate cancer    . Diabetes    . [place] cancer Maternal Grandmother   . Alzheimer's disease Father     SOCIAL HISTORY:  Social History   Social History  . Marital Status: Married    Spouse Name: N/A  . Number of Children: 3  . Years of Education: College   Occupational History  . Homemaker    Social History Main Topics  . Smoking status: Never Smoker   . Smokeless tobacco: Never Used  . Alcohol Use: 0.6 oz/week    1 Glasses of wine per week     Comment: Rarely - less than one drink every few months  . Drug Use: No  . Sexual Activity: Yes   Other Topics Concern  . Not on file   Social History Narrative   Lives at home with her husband and three children.   Right-handed.   Rare caffeine use.      PHYSICAL EXAM   There were no vitals filed for this visit.  Not recorded      There is no weight on file to calculate BMI.  PHYSICAL EXAMNIATION:  Gen: NAD, conversant, well nourised, obese, well groomed                     Cardiovascular: Regular rate rhythm, no peripheral edema, warm, nontender. Eyes: Conjunctivae clear without exudates or hemorrhage Neck: Supple, no carotid bruise. Pulmonary: Clear to auscultation bilaterally   NEUROLOGICAL EXAM:  MENTAL STATUS: Speech:    Speech  is normal; fluent and spontaneous with normal comprehension.  Cognition:     Orientation to time, place and person     Normal recent and remote memory     Normal Attention span and concentration     Normal Language, naming, repeating,spontaneous speech     Fund of knowledge   CRANIAL NERVES: CN II: Visual fields are full to confrontation. Fundoscopic exam is normal with sharp discs and no vascular changes. Pupils are round equal and briskly reactive to light. CN III, IV, VI: extraocular movement are normal. No ptosis. CN V: Facial sensation is intact to pinprick in all 3 divisions bilaterally. Corneal responses are intact.  CN VII: Face is symmetric with normal eye closure and smile. CN VIII: Hearing is normal to rubbing fingers CN IX, X: Palate elevates symmetrically. Phonation is normal. CN XI: Head turning and shoulder shrug are intact CN XII: Tongue is midline with normal movements and no atrophy.  MOTOR: There is no pronator drift of out-stretched arms. Muscle bulk and tone are normal. Muscle strength is normal.  REFLEXES: Reflexes are 2+ and symmetric at the biceps, triceps, knees, and ankles. Plantar responses are flexor.  SENSORY: Intact to light touch, pinprick, position sense, and vibration sense are intact in fingers and toes.  COORDINATION: Rapid alternating movements and fine finger movements are intact. There is no dysmetria on finger-to-nose and heel-knee-shin.     GAIT/STANCE: Posture is normal. Gait is steady with normal steps, base, arm swing, and turning. Heel and toe walking are normal. Tandem gait is normal.  Romberg is absent.   DIAGNOSTIC DATA (LABS, IMAGING, TESTING) - I reviewed patient records, labs, notes, testing and imaging myself where available.   ASSESSMENT AND PLAN  SAMHITA KRETSCH is a 42 y.o. female    Chronic migraine headaches  Tried and failed multiple different preventive medications in the past, Topamax, lamotrigine nortriptyline, Inderal, gabapentin, Lyrica, verapmil could not tolerate multiple medications due to side effect  Keep lamotrigine titrating to 100 mg twice a day as preventive medications   We will try Maxalt dissolvable, plus Zofran, plus Aleve, plus Flexeril as needed for moderate migraine  Imitrex subcutaneous injection for more severe migraine  History of pituitary adenoma,  status post transsphenoidal resection  Repeat MRI of the brain in December 2016 showed postsurgical change, with normal pituitary gland, small amount of small vessel disease consistent with her history of chronic migraine  BOTOX injection was performed according to protocol by [employer]. 100 units of BOTOX was dissolved into 2 cc NS.    BOTOX injection was performed according to protocol by [employer]. 100 units of BOTOX was dissolved into 2 cc NS.     Corrugator 2 sites, 10 units Procerus 1 site, 5 unit Frontalis 4 sites,  20 units, Temporalis 8 sites,  40 units  Occipitalis 6 sites, 30 units Cervical Paraspinal, 4 sites, 20 units Trapezius, 6 sites, 30 units  Bilateral masseters muscle 15 units each, extra 15 units were injected along bilateral upper cervical paraspinal muscles.  Patient tolerate the injection well. Will return for repeat injection in 3 months.  Generalized fatigue, depression, anxiety Also ordered laboratory evaluation to rule out treatable etiology Per patient's request, for her to a neuro-ophthalmologist  at Dr. Jolyn Nap for difficulty focusing, visual processing   Marcial Pacas, M.D. Ph.D.  [facility] [address] [place], [place] [address] Ph: [phone] Fax: [phone]  CC: Sandi Mariscal

## 2017-10-24 LAB — CBC WITH DIFFERENTIAL/PLATELET
BASOS: 1 %
Basophils Absolute: 0 10*3/uL (ref 0.0–0.2)
EOS (ABSOLUTE): 0.1 10*3/uL (ref 0.0–0.4)
EOS: 2 %
HEMATOCRIT: 35.5 % (ref 34.0–46.6)
Hemoglobin: 12.3 g/dL (ref 11.1–15.9)
IMMATURE GRANULOCYTES: 0 %
Immature Grans (Abs): 0 10*3/uL (ref 0.0–0.1)
LYMPHS ABS: 1.5 10*3/uL (ref 0.7–3.1)
Lymphs: 24 %
MCH: 28.9 pg (ref 26.6–33.0)
MCHC: 34.6 g/dL (ref 31.5–35.7)
MCV: 83 fL (ref 79–97)
MONOS ABS: 0.5 10*3/uL (ref 0.1–0.9)
Monocytes: 9 %
Neutrophils Absolute: 4.1 10*3/uL (ref 1.4–7.0)
Neutrophils: 64 %
Platelets: 320 10*3/uL (ref 150–379)
RBC: 4.26 x10E6/uL (ref 3.77–5.28)
RDW: 13.4 % (ref 12.3–15.4)
WBC: 6.3 10*3/uL (ref 3.4–10.8)

## 2017-10-24 LAB — COMPREHENSIVE METABOLIC PANEL
A/G RATIO: 2 (ref 1.2–2.2)
ALT: 9 IU/L (ref 0–32)
AST: 16 IU/L (ref 0–40)
Albumin: 4.9 g/dL (ref 3.5–5.5)
Alkaline Phosphatase: 35 IU/L — ABNORMAL LOW (ref 39–117)
BUN/Creatinine Ratio: 10 (ref 9–23)
BUN: 7 mg/dL (ref 6–24)
Bilirubin Total: 0.4 mg/dL (ref 0.0–1.2)
CALCIUM: 9.9 mg/dL (ref 8.7–10.2)
CO2: 23 mmol/L (ref 20–29)
CREATININE: 0.69 mg/dL (ref 0.57–1.00)
Chloride: 106 mmol/L (ref 96–106)
GFR calc Af Amer: 125 mL/min/{1.73_m2} (ref >59)
GFR, EST NON AFRICAN AMERICAN: 108 mL/min/{1.73_m2} (ref >59)
GLOBULIN, TOTAL: 2.5 g/dL (ref 1.5–4.5)
Glucose: 86 mg/dL (ref 65–99)
POTASSIUM: 4.6 mmol/L (ref 3.5–5.2)
Sodium: 143 mmol/L (ref 134–144)
Total Protein: 7.4 g/dL (ref 6.0–8.5)

## 2017-10-24 LAB — THYROID PANEL WITH TSH
Free Thyroxine Index: 1.4 (ref 1.2–4.9)
T3 Uptake Ratio: 25 % (ref 24–39)
T4 TOTAL: 5.7 ug/dL (ref 4.5–12.0)
TSH: 1.29 u[IU]/mL (ref 0.450–4.500)

## 2017-10-24 LAB — VITAMIN D 25 HYDROXY (VIT D DEFICIENCY, FRACTURES): VIT D 25 HYDROXY: 32 ng/mL (ref 30.0–100.0)

## 2017-10-24 LAB — IRON AND TIBC
Iron Saturation: 16 % (ref 15–55)
Iron: 63 ug/dL (ref 27–159)
TIBC: 383 ug/dL (ref 250–450)
UIBC: 320 ug/dL (ref 131–425)

## 2017-10-24 LAB — FERRITIN: FERRITIN: 12 ng/mL — AB (ref 15–150)

## 2017-10-24 LAB — VITAMIN B12: Vitamin B-12: 279 pg/mL (ref 232–1245)

## 2017-10-24 LAB — RHEUMATOID FACTOR: Rhuematoid fact SerPl-aCnc: <10 IU/mL (ref 0.0–13.9)

## 2017-11-05 ENCOUNTER — Telehealth: Payer: Self-pay | Admitting: *Deleted

## 2017-11-05 ENCOUNTER — Other Ambulatory Visit: Payer: Self-pay | Admitting: *Deleted

## 2017-11-05 MED ORDER — DICLOFENAC POTASSIUM(MIGRAINE) 50 MG PO PACK: 50.0000 mg | PACK | Freq: Every day | ORAL | 11 refills | Status: DC | PRN

## 2017-11-05 NOTE — Telephone Encounter (Signed)
Rx for Cambia sent to [facility] for better financial coverage of medication.

## 2017-11-08 ENCOUNTER — Telehealth: Payer: Self-pay | Admitting: Neurology

## 2017-11-08 ENCOUNTER — Other Ambulatory Visit: Payer: Self-pay | Admitting: Neurology

## 2017-11-08 DIAGNOSIS — R29810 Facial weakness: Secondary | ICD-10-CM

## 2017-11-08 MED ORDER — METHYLPREDNISOLONE 4 MG PO TBPK: ORAL_TABLET | ORAL | 1 refills | Status: DC

## 2017-11-08 NOTE — Addendum Note (Signed)
Addended by: Marcial Pacas on: 11/08/2017 09:45 AM   Modules accepted: Orders

## 2017-11-08 NOTE — Telephone Encounter (Signed)
I called her, severe days ago, after nap, she noticed right facial droopy, right eye closed, confusion.   She has history of pituitary tumor  I have suggested her to take baby aspirin daily, increase water intake,

## 2017-11-08 NOTE — Telephone Encounter (Signed)
Pt has called stating that for about a week her right eye lid keeps closing and she is having vision problems after severe head aches and confusion.  Pt also states she has weakness on that side.  Pt was asked if she planned on going to ED and she stated no, she is asking for a call

## 2017-11-08 NOTE — Telephone Encounter (Signed)
Noted, I will fax the order to [facility] for open MRI.

## 2017-11-08 NOTE — Telephone Encounter (Signed)
Patient called with worsening headache, right facial droop and vision changes, right weakness. She has tried all her acute medications. Advised this may very well be due to the migraine but cannot rule out stroke and advised to go to the ED. She declined. Will call in a steroid taper for her. Discussed side effects.

## 2017-11-12 MED ORDER — BUTALBITAL-APAP-CAFFEINE 50-325-40 MG PO TABS: 1.0000 | ORAL_TABLET | Freq: Four times a day (QID) | ORAL | 3 refills | Status: DC | PRN

## 2017-11-12 NOTE — Telephone Encounter (Addendum)
[name] [name] is non-formulary with her [employer] plan.  [facility] does not provide patients with medicare plans with a co-pay card.  A cash price for 9 packets of Cambia is $620.00.  This cost is using a [email or web address] coupon.  Left message for patient to return my call.

## 2017-11-12 NOTE — Addendum Note (Signed)
Addended by: Noberto Retort C on: 11/12/2017 12:21 PM   Modules accepted: Orders
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
## 2017-11-14 ENCOUNTER — Telehealth: Payer: Self-pay | Admitting: Neurology

## 2017-11-14 NOTE — Telephone Encounter (Signed)
Patient is scheduled to have her MRI done at [facility] on 11/15/17.

## 2017-11-15 NOTE — Telephone Encounter (Signed)
Pt called she is having MRI today at [facility] at noon. She was calling to schedule the follow up as directed by RN. I did not schedule an appt as Dr Krista Blue is booked into April. Please call to advise on Monday. Pt is aware the clinic closes at noon today, she is aware RN is not in today. RN can call her back on Monday

## 2017-11-18 NOTE — Telephone Encounter (Signed)
Left detailed message for the patient with the following information:  1) Her follow up has been scheduled this week on 11/21/17 - arrival time 2:45pm for 3pm appt. I asked her to call me back if this appt time is inconvenient and we can look for another time.  2) She had MRI at [facility] and I ask her to please bring in the MRI disc to her appt.  Provided our number to call back number.

## 2017-11-21 ENCOUNTER — Ambulatory Visit (INDEPENDENT_AMBULATORY_CARE_PROVIDER_SITE_OTHER): Payer: Medicare Other | Admitting: Neurology

## 2017-11-21 ENCOUNTER — Encounter: Payer: Self-pay | Admitting: Neurology

## 2017-11-21 VITALS — BP 117/72 | HR 103 | Ht 66.0 in | Wt 157.5 lb

## 2017-11-21 DIAGNOSIS — H02401 Unspecified ptosis of right eyelid: Secondary | ICD-10-CM | POA: Diagnosis not present

## 2017-11-21 DIAGNOSIS — G43719 Chronic migraine without aura, intractable, without status migrainosus: Secondary | ICD-10-CM | POA: Diagnosis not present

## 2017-11-21 MED ORDER — TRAZODONE HCL 50 MG PO TABS: 50.0000 mg | ORAL_TABLET | Freq: Every day | ORAL | 11 refills | Status: DC

## 2017-11-21 MED ORDER — FREMANEZUMAB-VFRM 225 MG/1.5ML ~~LOC~~ SOSY: 225.0000 mg | PREFILLED_SYRINGE | SUBCUTANEOUS | 11 refills | Status: DC

## 2017-11-21 NOTE — Progress Notes (Signed)
No chief complaint on file.     PATIENT: [name] [name] DOB: [birth year]    HISTORICAL  [name] [name] is a 42 years old right-handed female, seen in refer by primary care physician Dr. Nancy Fetter for evaluation of frequent headaches  I reviewed and summarized office note, she had a history of pituitary adenoma, had trans-sphenoid pituitary adenoma resection surgery at [facility] in 1999, prior to the surgery, she had breast discharge, frequent migraine headache with aura, occasionally double vision, most recent MRI of the brain in record was in 2001, there was no evidence of recurrent tumor.   She was a patient of our clinic in 2008 by Dr. Gaynell Face, for chronic migraine, she was also evaluated by [facility] in past, per record, she has tried and failed multiple preventive medications, Topamax, Depakote, nortriptyline, gabapentin, Lyrica, Inderal she has difficulty tolerating the medications  magnesium oxide, riboflavin, coenzyme Q 10 the-counter medications, Relpax, also tried dietary supplement magnesium, riboflavin, coenzyme Q 10,  For abortive treatment, she also tried and failed Maxalt, Relpax, Zomig, Frova, Imitrex tablet, she is currently taking Imitrex nasal spray as needed, it helped her about 50% of the time if she take the medicine during early onset of headaches  since her most recent pregnancy in 2014, she began to have gradual worsening migraine headaches, since 2015, she has about 5-6 migraine headaches each week, trigger for her migraines are strong smells, bright light, food additives, hungry, stress, exertion, smoking  Her typical migraine are right retro-orbital area severe pounding headache with associated light noise sensitivity, nauseous, lasting one day,  For now, she was receiving Botox injection with Dr. [name] at [facility] since 2014, which did helped her migraine, but has difficulty affording the bills  I have reviewed MRI of lumbar reported without contrast  from [facility] February 05 2015, multilevel degenerative disc disease, no significant foraminal or canal stenosis. Laboratory result August 30 first 2016, low normal TSH 0.7, normal CMP, with creatinine 0.8, vitamin D was 29.2, T3 total was within normal limits 85.5, normal CBC with hemoglobin of 12 point 3, normal PTH, free T4, UDS was negative  UPDATE Nov 03 2015: She has tried verapamil 40 mg twice a day for 3 weeks, complains of GI side effect, dizziness, is no longer taking it, it did not help her headache either. Insurance would not cover [place], she is now taking Imitrex nasal spray or tablet 100mg  1-2/week, oxycodone 5mg  prn (5 days a week), Flexeril 10mg  as needed for headaches,  We have reviewed MRI of the brain with and without contrast: Postoperative changes with in the sphenoid and sella turcica consistent with transsphenoidal pituitary microadenoma resection in the past. The pituitary gland has a normal signal, currently. There are several small subcortical T2/FLAIR hyperintense foci in the frontal lobes. This is a nonspecific finding but could be due to the patient's history of migraine and could also represent chronic microvascular ischemic change.   She still has headaches daily, couple times each week, her headache evolved to a more severe prolonged migraine headaches, her headache is usually  triggered by smells, muscle tightness at her neck and shoulder, hungry  UPDATE Mar 14 2016:  Patient has received Botox injection through: Rehabilitation Dr. [name] office for many years, reported mild neck extension weakness during her first injection around 2014, most recent injection was in March 2016, she received 160 units, denies significant side effect, she reported significant improvement of her migraine headaches with Botox injection  UPDATE  June 21 2016: Today she came in complains of whole body achy pain, under a lot of stress at home, reported a history of tick bite in 2002  to left thigh, with bull's eye skin lesion, now concern of possibility of Lyme disease  I have personally reviewed MRI of the brain with and without contrast in Dec 2016,  1.   Postoperative changes with in the sphenoid and sella turcica consistent with transsphenoidal pituitary microadenoma resection in the past. The pituitary gland has a normal signal, currently. 2.   There are several small subcortical T2/FLAIR hyperintense foci in the frontal lobes. This is a nonspecific finding but could be due to the patient's history of migraine and could also represent chronic microvascular ischemic change. Demyelination is less likely. The brain is otherwise normal. 3.   There are no acute findings.  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   Postoperative changes with in the sphenoid and sella turcica consistent with transsphenoidal pituitary microadenoma resection in the past. The pituitary gland has a normal signal, currently. 2.   There are several small subcortical T2/FLAIR hyperintense foci in the frontal lobes. This is a nonspecific finding but could be  due to the patient's history of migraine and could also represent chronic microvascular ischemic change. Demyelination is less likely. The brain is otherwise normal. 3.   There are no acute findings.  We went over the possible medication treatment in detail,  UPDATE Oct 23 2017: Last injection was in April 2018, she responded very well, because of the insurance reasons, has such delay of repeat Botox injection, also complains of generalized fatigue, difficulty focusing,  UPDATE Nov 21 2017: 2 weeks after his injection on October 23, 2017, also under the extreme family stress, she woke up one day severe right-sided headache, noted right-sided droopy eyelid, intermittent blurry vision, her right side blurred vision but overall has much improved, but still has noticeable difference on examination  MRI of the brain on November 15, 2017 showed mild supratentorium small vessel disease consistent with a long history of migraine,  She complains of frequent migraine headaches, but this is in the setting of extreme family tension,  REVIEW OF SYSTEMS: Full 14 system review of systems performed and notable only for as above   ALLERGIES: Allergies  Allergen Reactions  . Latex Rash  . Verapamil Nausea Only    Headache, dizziness    HOME MEDICATIONS: Current Outpatient Prescriptions  Medication Sig Dispense Refill  . buPROPion (WELLBUTRIN SR) 150 MG 12 hr tablet Take 150 mg by mouth daily.  2  . cyclobenzaprine (FLEXERIL) 5 MG tablet Take 5 mg by mouth 3 (three) times  daily as needed.  0  . lamoTRIgine (LAMICTAL) 100 MG tablet 1/2 po bid xone week, then one po bid 60 tablet 11  . Multiple Vitamin (MULTIVITAMIN) capsule Take 1 capsule by mouth daily.    Marland Kitchen omega-3 acid ethyl esters (LOVAZA) 1 g capsule Take 1 capsule (1 g total) by mouth daily. 30 capsule 6  . oxyCODONE (OXY IR/ROXICODONE) 5 MG immediate release tablet Take 5 mg by mouth as needed.  0  . SUMAtriptan (IMITREX) 20 MG/ACT nasal spray  Place 1 spray (20 mg total) into the nose once. May repeat in 2 hours if headache persists or recurs. 12 Inhaler 6   No current facility-administered medications for this visit.    PAST MEDICAL HISTORY: Past Medical History  Diagnosis Date  . Seasonal allergies   . GERD (gastroesophageal reflux disease)   . IBS (irritable bowel syndrome)   . Adenomatous colon polyp 2008  . Constipation   . Sciatica   . Hematochezia   . Internal hemorrhoids   . 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 Complication of anesthesia     "didn't wake up well/remembered stuff from during OR when I had pituitary surgery"  . DVT (deep vein thrombosis) in pregnancy 09/2013    "LLE; postpartum"  . Sleep apnea     "don't currently use CPAP" (07/28/2014)  . Prolactin secreting pituitary adenoma ([facility]) 1999  . H/O hiatal hernia   . Migraines     "couple times/wk" (07/28/2014)  . Arthritis     "jaw joints; neck" (07/28/2014)  . Fibromyalgia   . Depression     hx  . Anxiety 1999/2000  . Panic disorder 1999/2000    PAST SURGICAL HISTORY: Past Surgical History  Procedure Laterality Date  . Transphenoidal / transnasal hypophysectomy / resection pituitary tumor  1999    resection of benign pituitary tumor, [facility]  . Colonoscopy  01/03/2007    Dr. Silvano Rusk  . Esophagogastroduodenoscopy  09/29/2004    Dr. Kennedy Bucker  . Uterine fibroid surgery  2008  . Appendectomy  07/28/2014  . Laparoscopic appendectomy N/A 07/28/2014    Procedure: APPENDECTOMY LAPAROSCOPIC;  Surgeon: Autumn Messing III, MD;  Location: [facility] OR;  Service: General;  Laterality: N/A;    FAMILY HISTORY: Family History  Problem Relation Age of Onset  . Heart disease Mother   . Diabetes Mother   . Heart disease Father   . Prostate cancer Father   . Heart disease    . Prostate cancer    . Diabetes    . Colon cancer Maternal Grandmother   . Alzheimer's disease Father     SOCIAL HISTORY:  Social History   Social History    . Marital Status: Married    Spouse Name: N/A  . Number of Children: 3  . Years of Education: College   Occupational History  . Homemaker    Social History Main Topics  . Smoking status: Never Smoker   . Smokeless tobacco: Never Used  . Alcohol Use: 0.6 oz/week    1 Glasses of wine per week     Comment: Rarely - less than one drink every few months  . Drug Use: No  . Sexual Activity: Yes   Other Topics Concern  . Not on file   Social History Narrative   Lives at home with her husband and three children.   Right-handed.   Rare caffeine use.     PHYSICAL EXAM   There were no  vitals filed for this visit.  Not recorded      There is no weight on file to calculate BMI.  PHYSICAL EXAMNIATION:  Gen: NAD, conversant, well nourised, obese, well groomed                     Cardiovascular: Regular rate rhythm, no peripheral edema, warm, nontender. Eyes: Conjunctivae clear without exudates or hemorrhage Neck: Supple, no carotid bruise. Pulmonary: Clear to auscultation bilaterally   NEUROLOGICAL EXAM:  MENTAL STATUS: Speech:    Speech is normal; fluent and spontaneous with normal comprehension.  Cognition:     Orientation to time, place and person     Normal recent and remote memory     Normal Attention span and concentration     Normal Language, naming, repeating,spontaneous speech     Fund of knowledge   CRANIAL NERVES: CN II: Visual fields are full to confrontation. Fundoscopic exam is normal with sharp discs and no vascular changes. Pupils are round equal and briskly reactive to light. CN III, IV, VI: extraocular movement are normal.  Right side static ptosis, mild right eye closure weakness CN V: Facial sensation is intact to pinprick in all 3 divisions bilaterally. Corneal responses are intact.  CN VII: Mild right-sided droopy eyelid CN VIII: Hearing is normal to rubbing fingers CN IX, X: Palate elevates symmetrically. Phonation is normal. CN XI: Head turning  and shoulder shrug are intact CN XII: Tongue is midline with normal movements and no atrophy.  MOTOR: There is no pronator drift of out-stretched arms. Muscle bulk and tone are normal. Muscle strength is normal.  REFLEXES: Reflexes are 2+ and symmetric at the biceps, triceps, knees, and ankles. Plantar responses are flexor.  SENSORY: Intact to light touch, pinprick, position sense, and vibration sense are intact in fingers and toes.  COORDINATION: Rapid alternating movements and fine finger movements are intact. There is no dysmetria on finger-to-nose and heel-knee-shin.    GAIT/STANCE: Posture is normal. Gait is steady with normal steps, base, arm swing, and turning. Heel and toe walking are normal. Tandem gait is normal.  Romberg is absent.   DIAGNOSTIC DATA (LABS, IMAGING, TESTING) - I reviewed patient records, labs, notes, testing and imaging myself where available.   ASSESSMENT AND PLAN  [name] [name] is a 42 y.o. female    Chronic migraine headaches  Tried and failed multiple different preventive medications in the past, Topamax, lamotrigine nortriptyline, Inderal, gabapentin, Lyrica, verapmil could not tolerate multiple medications due to side effect   We will try Maxalt dissolvable, plus Zofran, plus Aleve, plus Flexeril as needed for moderate migraine  Imitrex subcutaneous injection for more severe migraine  She was receiving Botox injection every 3-4 months, which has provided moderate relief of her migraine headaches, but she developed right-sided droopy eyelid 2 weeks following her most recent injection October 23, 2017, overall has much improved,  Repeat MRI of the brain showed no acute abnormality, right side supratentorium small vessel disease consistent with long history of migraine headaches,  She also had a history of pituitary adenoma, status post transsphenoidal resection  She complains of excessive stress at home is frequent migraine headaches,  We will Botox  injection, start Ajovy as migraine prevention  Right-sided droopy eyelid since November 05, 2017  Most likely due to Botox side effect, overall has much EKG  But the sudden onset, apparent asymmetry, correlate with severe headaches,  MRA of the brain to rule out brain aneurysm, acetylcholine receptor antibody  Marcial Pacas, M.D. Ph.D  [facility] [address], [place], [place] [address] Ph: [phone] Fax: [phone]  CC: Sandi Mariscal

## 2017-11-22 ENCOUNTER — Telehealth: Payer: Self-pay | Admitting: Neurology

## 2017-11-22 DIAGNOSIS — H02401 Unspecified ptosis of right eyelid: Secondary | ICD-10-CM | POA: Insufficient documentation

## 2017-11-22 LAB — ACETYLCHOLINE RECEPTOR, BINDING: AChR Binding Ab, Serum: <0.03 nmol/L (ref 0.00–0.24)

## 2017-11-22 NOTE — Telephone Encounter (Signed)
Patient would like a open MRI. I faxed the order to [facility] they will reach out to the patient to schedule.

## 2017-11-26 ENCOUNTER — Telehealth: Payer: Self-pay | Admitting: *Deleted

## 2017-11-26 ENCOUNTER — Encounter: Payer: Self-pay | Admitting: *Deleted

## 2017-11-26 NOTE — Telephone Encounter (Signed)
-----   Message from Marcial Pacas, MD sent at 11/26/2017 10:15 AM EST ----- Please call patient for normal laboratory result

## 2017-11-26 NOTE — Telephone Encounter (Signed)
Attempted to reach patient on only # available from her DPR: [phone]. She has normal lab results Recording stated it is not a working #.

## 2017-11-26 NOTE — Telephone Encounter (Signed)
The patient informed us at her follow up that she does not have a phone right now.  She is working on getting one and will contact us with the number.  I have mailed a letter to her new home address that she provided to [place].

## 2017-11-27 ENCOUNTER — Telehealth: Payer: Self-pay

## 2017-11-27 NOTE — Telephone Encounter (Signed)
We received a prior authorization request for this medication. I have completed and submitted the PA on Cover My Meds and should have a determination within 48-72 hours.  Cover My Meds Key: [id number]

## 2017-11-28 ENCOUNTER — Encounter: Payer: Self-pay | Admitting: *Deleted

## 2017-11-28 MED ORDER — VENLAFAXINE HCL 37.5 MG PO TABS: 37.5000 mg | ORAL_TABLET | Freq: Two times a day (BID) | ORAL | 5 refills | Status: DC

## 2017-11-28 NOTE — Telephone Encounter (Signed)
Received denial stating patient must have tried divalproex delayed release tablets, venlafaxine tablets and metoprolol tablets prior to Ajovy being considered for coverage by her [facility] plan.

## 2017-11-28 NOTE — Addendum Note (Signed)
Addended by: Desmond Lope on: 11/28/2017 05:25 PM   Modules accepted: Orders

## 2017-11-28 NOTE — Telephone Encounter (Signed)
Ajovy denied. The patient has tried divalproex in the past.  Per vo by Dr. Krista Blue, provided rx for venlafaxine 37.5mg , one tablet BID.  I attempted to call patient - left message.  I called [facility] [phone] - spoke to [place]) - she made a note in patient's chart that Ajovy was denied and that Dr. Krista Blue is going to try her on a formulary medication.  The pharmacy will also provide the message to the patient.

## 2017-12-02 ENCOUNTER — Encounter: Payer: Self-pay | Admitting: *Deleted

## 2017-12-02 MED ORDER — METOPROLOL TARTRATE 50 MG PO TABS: 50.0000 mg | ORAL_TABLET | Freq: Two times a day (BID) | ORAL | 5 refills | Status: DC

## 2017-12-02 NOTE — Telephone Encounter (Signed)
Patient reports trying venlafaxine in the past (over 20 years ago) - states it caused a seizure.  She never thought to report this to our office.  The medication will be added to her allergy list.  She has also tried divalproex in the past.

## 2017-12-02 NOTE — Telephone Encounter (Signed)
Per vo by Dr. Krista Blue, do not take venlafaxine and start metoprolol 50mg , one tablet BID.  Pt agreeable to this plan.  Also, she received bill from Medicare from her recent labs.  I have called [facility] and updated her ICD codes.  They will re-file the claim with her insurance plan.

## 2017-12-02 NOTE — Telephone Encounter (Signed)
Left message requesting a return call.

## 2017-12-02 NOTE — Addendum Note (Signed)
Addended by: Noberto Retort C on: 12/02/2017 01:22 PM   Modules accepted: Orders

## 2017-12-02 NOTE — Telephone Encounter (Signed)
Pt returned RN's call °

## 2017-12-09 ENCOUNTER — Telehealth: Payer: Self-pay | Admitting: Neurology

## 2017-12-09 NOTE — Telephone Encounter (Signed)
Please call patient  MRA of the brain was normal  This was done at [facility]

## 2017-12-09 NOTE — Telephone Encounter (Signed)
Left message letting her know the scan was normal.

## 2018-02-18 ENCOUNTER — Telehealth: Payer: Self-pay | Admitting: Neurology

## 2018-02-18 ENCOUNTER — Encounter: Payer: Self-pay | Admitting: *Deleted

## 2018-02-18 MED ORDER — FREMANEZUMAB-VFRM 225 MG/1.5ML ~~LOC~~ SOSY: 225.0000 mg | PREFILLED_SYRINGE | SUBCUTANEOUS | 11 refills | Status: DC

## 2018-02-18 NOTE — Telephone Encounter (Signed)
Left message requesting a return call.

## 2018-02-18 NOTE — Telephone Encounter (Signed)
Pt requesting a call to discuss metoprolol tartrate (LOPRESSOR) 50 MG tablet stating she is no longer taking it due to s/e but did not want to go into detail with me. Please call to advise.

## 2018-02-18 NOTE — Telephone Encounter (Signed)
Attempted to reach patient prior to leaving today.  Left a message requesting a return call.

## 2018-02-18 NOTE — Telephone Encounter (Signed)
Per vo by Dr. Krista Blue, sent in new rx for Ajovy.  The medication should be approved this time by [facility].  Patient has tried all the formulary medications: venlafaxine, divalproex and metoprolol.  Patient is agreeable to this plan.  She had to cancel her follow up this week due to lack of childcare but will call back to reschedule her follow up.

## 2018-02-18 NOTE — Telephone Encounter (Signed)
Patient called back but I was with a patient.  Called her again and left another message.

## 2018-02-19 ENCOUNTER — Ambulatory Visit: Payer: Medicare Other | Admitting: Neurology

## 2018-02-19 NOTE — Telephone Encounter (Signed)
Left message letting her know that Dr. Krista Blue would like to try Ajovy, which should now be approved by insurance.  I also asked her to call back and schedule follow up.  The PA for Ajovy has been initiated w/ [employer] [phone]) via covermymeds and the decision is pending at this time.    Pt [id number].

## 2018-02-20 NOTE — Telephone Encounter (Signed)
Ajovy PA approved by [facility]through 10/21/2018.

## 2018-10-27 ENCOUNTER — Telehealth: Payer: Self-pay | Admitting: *Deleted

## 2018-10-27 NOTE — Telephone Encounter (Signed)
Received PA request for Ajovy.  The patient has [employer] [phone]) coverage and the formulary has changed for 2020.  The preferred medications are now Aimovig and Emgality.  The patient has tried and failed venlafaxine, divalproex and metoprolol.  The request was submitted for Ajovy in hopes they will cover it since the patient has been stable on the medication.  Decision pending through covermymeds (key: [id number]).  [employer] VE#[id number].  I called the patient and left a detailed message with the information above (ok per DPR).  I also requested she call our office to schedule an appt.  She was last seen in January 2019 and was suppose to follow up in three months.

## 2018-10-28 NOTE — Telephone Encounter (Signed)
Fax received from [facility], phone# [phone]. Ajovy 225mg /1.52ml syringe approved until 10/22/19. Member ID: [id number]

## 2018-10-28 NOTE — Telephone Encounter (Signed)
I called the patient and left a detailed message on her voicemail (ok per DPR).  I let her know the Ajovy was approved.  Also, I notified her that she has not been seen since January 2019 and she will need to call for an appt to continue her migraine management with our office.  Provided our number to call back.

## 2018-11-06 NOTE — Telephone Encounter (Signed)
Returned call to the patient - she has been scheduled for an appt on 12/11/2018.

## 2018-11-06 NOTE — Telephone Encounter (Signed)
Pt states she is returning a call to [employer] from earlier this week, please call

## 2018-11-10 ENCOUNTER — Ambulatory Visit: Payer: Self-pay | Admitting: Neurology

## 2018-11-20 ENCOUNTER — Encounter: Payer: Self-pay | Admitting: Neurology

## 2018-11-20 ENCOUNTER — Telehealth: Payer: Self-pay | Admitting: Neurology

## 2018-11-20 ENCOUNTER — Ambulatory Visit: Payer: Self-pay | Admitting: Neurology

## 2018-11-20 ENCOUNTER — Ambulatory Visit (INDEPENDENT_AMBULATORY_CARE_PROVIDER_SITE_OTHER): Payer: Medicare Other | Admitting: Neurology

## 2018-11-20 ENCOUNTER — Other Ambulatory Visit: Payer: Self-pay

## 2018-11-20 ENCOUNTER — Encounter: Payer: Self-pay | Admitting: *Deleted

## 2018-11-20 ENCOUNTER — Telehealth: Payer: Self-pay | Admitting: *Deleted

## 2018-11-20 VITALS — BP 118/63 | HR 97 | Resp 16 | Ht 66.0 in | Wt 159.0 lb

## 2018-11-20 DIAGNOSIS — IMO0002 Reserved for concepts with insufficient information to code with codable children: Secondary | ICD-10-CM

## 2018-11-20 DIAGNOSIS — G43709 Chronic migraine without aura, not intractable, without status migrainosus: Secondary | ICD-10-CM | POA: Diagnosis not present

## 2018-11-20 MED ORDER — ONDANSETRON 4 MG PO TBDP: 4.0000 mg | ORAL_TABLET | Freq: Three times a day (TID) | ORAL | 6 refills | Status: DC | PRN

## 2018-11-20 MED ORDER — RIZATRIPTAN BENZOATE 10 MG PO TBDP: 10.0000 mg | ORAL_TABLET | ORAL | 6 refills | Status: DC | PRN

## 2018-11-20 MED ORDER — BUTALBITAL-APAP-CAFFEINE 50-325-40 MG PO TABS: 1.0000 | ORAL_TABLET | Freq: Four times a day (QID) | ORAL | 5 refills | Status: DC | PRN

## 2018-11-20 NOTE — Telephone Encounter (Signed)
4 wk botox inj

## 2018-11-20 NOTE — Progress Notes (Signed)
No chief complaint on file.     PATIENT: [name] [name] DOB: [birth year]    HISTORICAL  [name] [name] is a 43 years old right-handed female, seen in refer by primary care physician Dr. Nancy Fetter for evaluation of frequent headaches  I reviewed and summarized office note, she had a history of pituitary adenoma, had trans-sphenoid pituitary adenoma resection surgery at [facility] in 1999, prior to the surgery, she had breast discharge, frequent migraine headache with aura, occasionally double vision, most recent MRI of the brain in record was in 2001, there was no evidence of recurrent tumor.   She was a patient of our clinic in 2008 by Dr. Gaynell Face, for chronic migraine, she was also evaluated by [facility] in past, per record, she has tried and failed multiple preventive medications, Topamax, Depakote, nortriptyline, gabapentin, Lyrica, Inderal she has difficulty tolerating the medications  magnesium oxide, riboflavin, coenzyme Q 10 the-counter medications, Relpax, also tried dietary supplement magnesium, riboflavin, coenzyme Q 10,  For abortive treatment, she also tried and failed Maxalt, Relpax, Zomig, Frova, Imitrex tablet, she is currently taking Imitrex nasal spray as needed, it helped her about 50% of the time if she take the medicine during early onset of headaches  since her most recent pregnancy in 2014, she began to have gradual worsening migraine headaches, since 2015, she has about 5-6 migraine headaches each week, trigger for her migraines are strong smells, bright light, food additives, hungry, stress, exertion, smoking  Her typical migraine are right retro-orbital area severe pounding headache with associated light noise sensitivity, nauseous, lasting one day,  For now, she was receiving Botox injection with Dr. [name] at [facility] since 2014, which did helped her migraine, but has difficulty affording the bills  I have reviewed MRI of lumbar reported without contrast  from [facility] February 05 2015, multilevel degenerative disc disease, no significant foraminal or canal stenosis. Laboratory result August 30 first 2016, low normal TSH 0.7, normal CMP, with creatinine 0.8, vitamin D was 29.2, T3 total was within normal limits 85.5, normal CBC with hemoglobin of 12 point 3, normal PTH, free T4, UDS was negative  UPDATE Nov 03 2015: She has tried verapamil 40 mg twice a day for 3 weeks, complains of GI side effect, dizziness, is no longer taking it, it did not help her headache either. Insurance would not cover [place], she is now taking Imitrex nasal spray or tablet 100mg  1-2/week, oxycodone 5mg  prn (5 days a week), Flexeril 10mg  as needed for headaches,  We have reviewed MRI of the brain with and without contrast: Postoperative changes with in the sphenoid and sella turcica consistent with transsphenoidal pituitary microadenoma resection in the past. The pituitary gland has a normal signal, currently. There are several small subcortical T2/FLAIR hyperintense foci in the frontal lobes. This is a nonspecific finding but could be due to the patient's history of migraine and could also represent chronic microvascular ischemic change.   She still has headaches daily, couple times each week, her headache evolved to a more severe prolonged migraine headaches, her headache is usually  triggered by smells, muscle tightness at her neck and shoulder, hungry  UPDATE Mar 14 2016:  Patient has received Botox injection through: Rehabilitation Dr. [name] office for many years, reported mild neck extension weakness during her first injection around 2014, most recent injection was in March 2016, she received 160 units, denies significant side effect, she reported significant improvement of her migraine headaches with Botox injection  UPDATE  June 21 2016: Today she came in complains of whole body achy pain, under a lot of stress at home, reported a history of tick bite in 2002  to left thigh, with bull's eye skin lesion, now concern of possibility of Lyme disease  I have personally reviewed MRI of the brain with and without contrast in Dec 2016,  1.   Postoperative changes with in the sphenoid and sella turcica consistent with transsphenoidal pituitary microadenoma resection in the past. The pituitary gland has a normal signal, currently. 2.   There are several small subcortical T2/FLAIR hyperintense foci in the frontal lobes. This is a nonspecific finding but could be due to the patient's history of migraine and could also represent chronic microvascular ischemic change. Demyelination is less likely. The brain is otherwise normal. 3.   There are no acute findings.  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   Postoperative changes with in the sphenoid and sella turcica consistent with transsphenoidal pituitary microadenoma resection in the past. The pituitary gland has a normal signal, currently. 2.   There are several small subcortical T2/FLAIR hyperintense foci in the frontal lobes. This is a nonspecific finding but could be  due to the patient's history of migraine and could also represent chronic microvascular ischemic change. Demyelination is less likely. The brain is otherwise normal. 3.   There are no acute findings.  We went over the possible medication treatment in detail,  UPDATE Oct 23 2017: Last injection was in April 2018, she responded very well, because of the insurance reasons, has such delay of repeat Botox injection, also complains of generalized fatigue, difficulty focusing,  UPDATE Nov 21 2017: 2 weeks after his injection on October 23, 2017, also under the extreme family stress, she woke up one day severe right-sided headache, noted right-sided droopy eyelid, intermittent blurry vision, her right side blurred vision but overall has much improved, but still has noticeable difference on examination  MRI of the brain on November 15, 2017 showed mild supratentorium small vessel disease consistent with a long history of migraine,  She complains of frequent migraine headaches, but this is in the setting of extreme family tension,  UPDATE Nov 20 2018: She used to tell me since April 2019, at 2 consecutive months, it did help her migraine, her migraine become less frequent, less severe, previously Botox was very helpful, she decided to stop Botox for a while because developed droopy eyelid on 1 injection, but today, she wants to go back on Botox as preventive medication,  She is now having headache days 15 days in a month, related to her menstruation cycle, with significant light noise smells sensitivity,  she is taking Fioricet, Imitrex, Flexeril as needed, she also complains of frequent neck pain, tension,  REVIEW OF SYSTEMS: Full 14 system review of systems performed and notable only for fatigue, palpitation, cold intolerance, excessive thirst, constipation, restless leg, frequent awakening, daytime sleepiness, environmental allergy, frequent urination, joint pain, back pain, paralysis, pain, headaches,  agitation, confusion, decreased concentration, anxiety, suicidal thoughts  All rest review of the system were negative  ALLERGIES: Allergies  Allergen Reactions  . Latex Rash  . Verapamil Nausea Only    Headache, dizziness    HOME MEDICATIONS: Current Outpatient Prescriptions  Medication Sig Dispense Refill  . buPROPion (WELLBUTRIN SR) 150 MG 12 hr tablet Take 150 mg by mouth daily.  2  . cyclobenzaprine (FLEXERIL) 5 MG tablet Take 5 mg by mouth 3 (three) times daily as needed.  0  . lamoTRIgine (LAMICTAL) 100 MG tablet 1/2 po bid xone week, then one po bid 60 tablet 11  . Multiple Vitamin (MULTIVITAMIN) capsule Take 1 capsule by mouth daily.    Marland Kitchen omega-3 acid ethyl esters (LOVAZA) 1 g capsule Take 1 capsule (1 g total) by mouth daily. 30 capsule 6  . oxyCODONE (OXY IR/ROXICODONE) 5 MG immediate release tablet Take 5 mg by mouth as needed.  0  . SUMAtriptan (IMITREX) 20 MG/ACT nasal spray Place 1 spray (20 mg total) into the nose once. May repeat in 2 hours if headache persists or recurs. 12 Inhaler 6   No current facility-administered medications for this visit.    PAST MEDICAL HISTORY: Past Medical History  Diagnosis Date  . Seasonal allergies   . GERD (gastroesophageal reflux disease)   . IBS (irritable bowel syndrome)   . Adenomatous colon polyp 2008  . Constipation   . Sciatica   . Hematochezia   . Internal hemorrhoids   . 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 Complication of anesthesia     "didn't wake up well/remembered stuff from during OR when I had pituitary surgery"  . DVT (deep vein thrombosis) in pregnancy 09/2013    "LLE; postpartum"  . Sleep apnea     "don't currently use CPAP" (07/28/2014)  . Prolactin secreting pituitary adenoma ([place]) 1999  . H/O hiatal hernia   . Migraines     "couple times/wk" (07/28/2014)  . Arthritis     "jaw joints; neck" (07/28/2014)  . Fibromyalgia   . Depression     hx  . Anxiety 1999/2000  . Panic disorder 1999/2000     PAST SURGICAL HISTORY: Past Surgical History  Procedure Laterality Date  . Transphenoidal / transnasal hypophysectomy / resection pituitary tumor  1999    resection of benign pituitary tumor, [facility]  . Colonoscopy  01/03/2007    Dr. Silvano Rusk  . Esophagogastroduodenoscopy  09/29/2004    Dr. Kennedy Bucker  . Uterine fibroid surgery  2008  . Appendectomy  07/28/2014  . Laparoscopic appendectomy N/A 07/28/2014    Procedure: APPENDECTOMY LAPAROSCOPIC;  Surgeon: Autumn Messing III, MD;  Location: [facility] OR;  Service: General;  Laterality: N/A;    FAMILY HISTORY: Family History  Problem Relation Age of Onset  . Heart disease Mother   . Diabetes Mother   . Heart disease Father   . Prostate cancer Father   . Heart disease    . Prostate cancer    . Diabetes    . Colon cancer Maternal Grandmother   . Alzheimer's disease Father     SOCIAL HISTORY:  Social History  Social History  . Marital Status: Married    Spouse Name: N/A  . Number of Children: 3  . Years of Education: College   Occupational History  . Homemaker    Social History Main Topics  . Smoking status: Never Smoker   . Smokeless tobacco: Never Used  . Alcohol Use: 0.6 oz/week    1 Glasses of wine per week     Comment: Rarely - less than one drink every few months  . Drug Use: No  . Sexual Activity: Yes   Other Topics Concern  . Not on file   Social History Narrative   Lives at home with her husband and three children.   Right-handed.   Rare caffeine use.     PHYSICAL EXAM   There were no vitals filed for this visit.  Not recorded      There is no weight on file to calculate BMI.  PHYSICAL EXAMNIATION:  Gen: NAD, conversant, well nourised, obese, well groomed                     Cardiovascular: Regular rate rhythm, no peripheral edema, warm, nontender. Eyes: Conjunctivae clear without exudates or hemorrhage Neck: Supple, no carotid bruise. Pulmonary: Clear to auscultation  bilaterally   NEUROLOGICAL EXAM:  MENTAL STATUS: Speech:    Speech is normal; fluent and spontaneous with normal comprehension.  Cognition:     Orientation to time, place and person     Normal recent and remote memory     Normal Attention span and concentration     Normal Language, naming, repeating,spontaneous speech     Fund of knowledge   CRANIAL NERVES: CN II: Visual fields are full to confrontation. Fundoscopic exam is normal with sharp discs and no vascular changes. Pupils are round equal and briskly reactive to light. CN III, IV, VI: extraocular movement are normal.  Right side static ptosis, mild right eye closure weakness CN V: Facial sensation is intact to pinprick in all 3 divisions bilaterally. Corneal responses are intact.  CN VII: Mild right-sided droopy eyelid CN VIII: Hearing is normal to rubbing fingers CN IX, X: Palate elevates symmetrically. Phonation is normal. CN XI: Head turning and shoulder shrug are intact CN XII: Tongue is midline with normal movements and no atrophy.  MOTOR: There is no pronator drift of out-stretched arms. Muscle bulk and tone are normal. Muscle strength is normal.  REFLEXES: Reflexes are 2+ and symmetric at the biceps, triceps, knees, and ankles. Plantar responses are flexor.  SENSORY: Intact to light touch, pinprick, position sense, and vibration sense are intact in fingers and toes.  COORDINATION: Rapid alternating movements and fine finger movements are intact. There is no dysmetria on finger-to-nose and heel-knee-shin.    GAIT/STANCE: Posture is normal. Gait is steady with normal steps, base, arm swing, and turning. Heel and toe walking are normal. Tandem gait is normal.  Romberg is absent.   DIAGNOSTIC DATA (LABS, IMAGING, TESTING) - I reviewed patient records, labs, notes, testing and imaging myself where available.   ASSESSMENT AND PLAN  [name] [name] is a 43 y.o. female    Chronic migraine headaches  Tried and  failed multiple different preventive medications in the past, Topamax, lamotrigine nortriptyline, Inderal, gabapentin, Lyrica, verapmil could not tolerate multiple medications due to side effect   Continue Maxalt dissolvable, plus Zofran, plus Aleve, plus Flexeril as needed for moderate migraine  Imitrex subcutaneous injection may be used for more severe migraine  She was receiving  Botox injection every 3-4 months, which has provided moderate relief of her migraine headaches, but she developed right-sided droopy eyelid 2 weeks following her most recent injection on October 23, 2017, recover to baseline after 3 to 4 weeks,  She tried Ajovy injection in April and May 2019, reported significant improvement, after discussed with patient, she wants to go back on Botox as migraine prevention  repeat MRI of the brain showed no acute abnormality, right side supratentorium small vessel disease consistent with long history of migraine headaches,  She also had a history of pituitary adenoma, status post transsphenoidal resection  She also has significant stress at home    Marcial Pacas, M.D. Ph.D  [facility] [address], [place], [place] [address] Ph: [phone] Fax: [phone]  CC: Sandi Mariscal

## 2018-11-20 NOTE — Telephone Encounter (Signed)
The patient returned my call and can be here at noon.

## 2018-11-20 NOTE — Telephone Encounter (Signed)
Left second message with the information below.  Requested her to return my call.

## 2018-11-20 NOTE — Telephone Encounter (Signed)
She has a follow up appt today at 3:00pm (arrival at 2:30pm).  Dr. Krista Blue has to leave the office early but has offered to see this new patient during lunch, so she can still be seen today.  I left a message for the patient providing this information.  I asked her to call us back to let us know if she can make a noon appt (arrival at 11:30am).

## 2018-11-20 NOTE — Telephone Encounter (Signed)
Left third message with the same information below.

## 2018-11-21 NOTE — Telephone Encounter (Signed)
I called to schedule the patient for her injection but she did not answer so I left a VM asking her to call me back.

## 2018-11-23 ENCOUNTER — Encounter: Payer: Self-pay | Admitting: Neurology

## 2018-12-04 ENCOUNTER — Other Ambulatory Visit: Payer: Self-pay | Admitting: Neurology

## 2018-12-11 ENCOUNTER — Ambulatory Visit: Payer: Self-pay | Admitting: Neurology

## 2019-01-19 ENCOUNTER — Telehealth: Payer: Self-pay | Admitting: Neurology

## 2019-01-19 NOTE — Telephone Encounter (Signed)
I spoke with Edy and she will come in at 12:30 tomorrow. IT would not let me override this, would you schedule her michelle?

## 2019-01-19 NOTE — Telephone Encounter (Signed)
The patient has been added to the schedule.

## 2019-01-19 NOTE — Telephone Encounter (Signed)
I called to ask the patient to move her apt to tomorrow 01/20/19 but she did not answer so I left a VM asking her to call me back.

## 2019-01-20 ENCOUNTER — Ambulatory Visit (INDEPENDENT_AMBULATORY_CARE_PROVIDER_SITE_OTHER): Payer: Medicare Other | Admitting: Neurology

## 2019-01-20 ENCOUNTER — Other Ambulatory Visit: Payer: Self-pay

## 2019-01-20 ENCOUNTER — Encounter: Payer: Self-pay | Admitting: Neurology

## 2019-01-20 VITALS — BP 128/83 | HR 84 | Ht 66.0 in | Wt 160.0 lb

## 2019-01-20 DIAGNOSIS — G43709 Chronic migraine without aura, not intractable, without status migrainosus: Secondary | ICD-10-CM | POA: Diagnosis not present

## 2019-01-20 DIAGNOSIS — IMO0002 Reserved for concepts with insufficient information to code with codable children: Secondary | ICD-10-CM

## 2019-01-20 NOTE — Progress Notes (Signed)
No chief complaint on file.     PATIENT: [name] [name] DOB: [birth year]    HISTORICAL  [name] [name] is a 43 years old right-handed female, seen in refer by primary care physician Dr. Nancy Fetter for evaluation of frequent headaches  I reviewed and summarized office note, she had a history of pituitary adenoma, had trans-sphenoid pituitary adenoma resection surgery at [facility] in 1999, prior to the surgery, she had breast discharge, frequent migraine headache with aura, occasionally double vision, most recent MRI of the brain in record was in 2001, there was no evidence of recurrent tumor.   She was a patient of our clinic in 2008 by Dr. Gaynell Face, for chronic migraine, she was also evaluated by [facility] in past, per record, she has tried and failed multiple preventive medications, Topamax, Depakote, nortriptyline, gabapentin, Lyrica, Inderal she has difficulty tolerating the medications  magnesium oxide, riboflavin, coenzyme Q 10 the-counter medications, Relpax, also tried dietary supplement magnesium, riboflavin, coenzyme Q 10,  For abortive treatment, she also tried and failed Maxalt, Relpax, Zomig, Frova, Imitrex tablet, she is currently taking Imitrex nasal spray as needed, it helped her about 50% of the time if she take the medicine during early onset of headaches  since her most recent pregnancy in 2014, she began to have gradual worsening migraine headaches, since 2015, she has about 5-6 migraine headaches each week, trigger for her migraines are strong smells, bright light, food additives, hungry, stress, exertion, smoking  Her typical migraine are right retro-orbital area severe pounding headache with associated light noise sensitivity, nauseous, lasting one day,  For now, she was receiving Botox injection with Dr. [name] at [facility] since 2014, which did helped her migraine, but has difficulty affording the bills  I have reviewed MRI of lumbar reported without contrast  from [facility] February 05 2015, multilevel degenerative disc disease, no significant foraminal or canal stenosis. Laboratory result August 30 first 2016, low normal TSH 0.7, normal CMP, with creatinine 0.8, vitamin D was 29.2, T3 total was within normal limits 85.5, normal CBC with hemoglobin of 12 point 3, normal PTH, free T4, UDS was negative  UPDATE Nov 03 2015: She has tried verapamil 40 mg twice a day for 3 weeks, complains of GI side effect, dizziness, is no longer taking it, it did not help her headache either. Insurance would not cover [place], she is now taking Imitrex nasal spray or tablet 100mg  1-2/week, oxycodone 5mg  prn (5 days a week), Flexeril 10mg  as needed for headaches,  We have reviewed MRI of the brain with and without contrast: Postoperative changes with in the sphenoid and sella turcica consistent with transsphenoidal pituitary microadenoma resection in the past. The pituitary gland has a normal signal, currently. There are several small subcortical T2/FLAIR hyperintense foci in the frontal lobes. This is a nonspecific finding but could be due to the patient's history of migraine and could also represent chronic microvascular ischemic change.   She still has headaches daily, couple times each week, her headache evolved to a more severe prolonged migraine headaches, her headache is usually  triggered by smells, muscle tightness at her neck and shoulder, hungry  UPDATE Mar 14 2016:  Patient has received Botox injection through: Rehabilitation Dr. [name] office for many years, reported mild neck extension weakness during her first injection around 2014, most recent injection was in March 2016, she received 160 units, denies significant side effect, she reported significant improvement of her migraine headaches with Botox injection  UPDATE  June 21 2016: Today she came in complains of whole body achy pain, under a lot of stress at home, reported a history of tick bite in 2002  to left thigh, with bull's eye skin lesion, now concern of possibility of Lyme disease  I have personally reviewed MRI of the brain with and without contrast in Dec 2016,  1.   Postoperative changes with in the sphenoid and sella turcica consistent with transsphenoidal pituitary microadenoma resection in the past. The pituitary gland has a normal signal, currently. 2.   There are several small subcortical T2/FLAIR hyperintense foci in the frontal lobes. This is a nonspecific finding but could be due to the patient's history of migraine and could also represent chronic microvascular ischemic change. Demyelination is less likely. The brain is otherwise normal. 3.   There are no acute findings.  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   Postoperative changes with in the sphenoid and sella turcica consistent with transsphenoidal pituitary microadenoma resection in the past. The pituitary gland has a normal signal, currently. 2.   There are several small subcortical T2/FLAIR hyperintense foci in the frontal lobes. This is a nonspecific finding but could be  due to the patient's history of migraine and could also represent chronic microvascular ischemic change. Demyelination is less likely. The brain is otherwise normal. 3.   There are no acute findings.  We went over the possible medication treatment in detail,  UPDATE Oct 23 2017: Last injection was in April 2018, she responded very well, because of the insurance reasons, has such delay of repeat Botox injection, also complains of generalized fatigue, difficulty focusing,  UPDATE Nov 21 2017: 2 weeks after his injection on October 23, 2017, also under the extreme family stress, she woke up one day severe right-sided headache, noted right-sided droopy eyelid, intermittent blurry vision, her right side blurred vision but overall has much improved, but still has noticeable difference on examination  MRI of the brain on November 15, 2017 showed mild supratentorium small vessel disease consistent with a long history of migraine,  She complains of frequent migraine headaches, but this is in the setting of extreme family tension,  UPDATE Nov 20 2018: Following Ajovy injection in April 2019, for 2 consecutive months, it did help her migraine, her migraine become less frequent, less severe, previously Botox was very helpful, she decided to stop Botox for a while because developed droopy eyelid on 1 injection, but today, she wants to go back on Botox as preventive medication,  She is now having headache days 15 days in a month, related to her menstruation cycle, with significant light noise smells sensitivity, she is  taking Fioricet, Imitrex, Flexeril as needed, she also complains of frequent neck pain, tension,  REVIEW OF SYSTEMS: Full 14 system review of systems performed and notable only for fatigue, palpitation, cold intolerance, excessive thirst, constipation, restless leg, frequent awakening, daytime sleepiness, environmental allergy, frequent urination, joint pain, back pain, paralysis, pain, headaches,  agitation, confusion, decreased concentration, anxiety, suicidal thoughts  All rest review of the system were negative  ALLERGIES: Allergies  Allergen Reactions  . Latex Rash  . Verapamil Nausea Only    Headache, dizziness    HOME MEDICATIONS: Current Outpatient Prescriptions  Medication Sig Dispense Refill  . buPROPion (WELLBUTRIN SR) 150 MG 12 hr tablet Take 150 mg by mouth daily.  2  . cyclobenzaprine (FLEXERIL) 5 MG tablet Take 5 mg by mouth 3 (three) times daily as needed.  0  . lamoTRIgine (LAMICTAL) 100 MG tablet 1/2 po bid xone week, then one po bid 60 tablet 11  . Multiple Vitamin (MULTIVITAMIN) capsule Take 1 capsule by mouth daily.    Marland Kitchen omega-3 acid ethyl esters (LOVAZA) 1 g capsule Take 1 capsule (1 g total) by mouth daily. 30 capsule 6  . oxyCODONE (OXY IR/ROXICODONE) 5 MG immediate release tablet Take 5 mg by mouth as needed.  0  . SUMAtriptan (IMITREX) 20 MG/ACT nasal spray Place 1 spray (20 mg total) into the nose once. May repeat in 2 hours if headache persists or recurs. 12 Inhaler 6   No current facility-administered medications for this visit.    PAST MEDICAL HISTORY: Past Medical History  Diagnosis Date  . Seasonal allergies   . GERD (gastroesophageal reflux disease)   . IBS (irritable bowel syndrome)   . Adenomatous colon polyp 2008  . Constipation   . Sciatica   . Hematochezia   . Internal hemorrhoids   . 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 Complication of anesthesia     "didn't wake up well/remembered stuff from during OR when I had pituitary surgery"  . DVT (deep vein thrombosis) in pregnancy 09/2013    "LLE; postpartum"  . Sleep apnea     "don't currently use CPAP" (07/28/2014)  . Prolactin secreting pituitary adenoma ([place]) 1999  . H/O hiatal hernia   . Migraines     "couple times/wk" (07/28/2014)  . Arthritis     "jaw joints; neck" (07/28/2014)  . Fibromyalgia   . Depression     hx  . Anxiety 1999/2000  . Panic disorder 1999/2000     PAST SURGICAL HISTORY: Past Surgical History  Procedure Laterality Date  . Transphenoidal / transnasal hypophysectomy / resection pituitary tumor  1999    resection of benign pituitary tumor, [facility]  . Colonoscopy  01/03/2007    Dr. Silvano Rusk  . Esophagogastroduodenoscopy  09/29/2004    Dr. Kennedy Bucker  . Uterine fibroid surgery  2008  . Appendectomy  07/28/2014  . Laparoscopic appendectomy N/A 07/28/2014    Procedure: APPENDECTOMY LAPAROSCOPIC;  Surgeon: Autumn Messing III, MD;  Location: [facility] OR;  Service: General;  Laterality: N/A;    FAMILY HISTORY: Family History  Problem Relation Age of Onset  . Heart disease Mother   . Diabetes Mother   . Heart disease Father   . Prostate cancer Father   . Heart disease    . Prostate cancer    . Diabetes    . Colon cancer Maternal Grandmother   . Alzheimer's disease Father     SOCIAL HISTORY:  Social History   Social History  .  Marital Status: Married    Spouse Name: N/A  . Number of Children: 3  . Years of Education: College   Occupational History  . Homemaker    Social History Main Topics  . Smoking status: Never Smoker   . Smokeless tobacco: Never Used  . Alcohol Use: 0.6 oz/week    1 Glasses of wine per week     Comment: Rarely - less than one drink every few months  . Drug Use: No  . Sexual Activity: Yes   Other Topics Concern  . Not on file   Social History Narrative   Lives at home with her husband and three children.   Right-handed.   Rare caffeine use.     PHYSICAL EXAM   There were no vitals filed for this visit.  Not recorded      ASSESSMENT AND PLAN  MILTA CROSON is a 43 y.o. female    Chronic migraine headaches  Tried and failed multiple different preventive medications in the past, Topamax, lamotrigine nortriptyline, Inderal, gabapentin, Lyrica, verapmil could not tolerate multiple medications due to side effect   Continue Maxalt dissolvable, plus Zofran,Aleve, Flexeril as  needed for moderate migraine  Imitrex subcutaneous injection may be used for more severe migraine  She was receiving Botox injection every 3-4 months, which has provided moderate relief of her migraine headaches, but she developed right-sided droopy eyelid 2 weeks following her most recent injection on October 23, 2017, recover to baseline after 3 to 4 weeks,  She tried Ajovy injection in April and May 2019, reported significant improvement, after discussed with patient, she wants to go back on Botox as migraine prevention  repeat MRI of the brain showed no acute abnormality, right side supratentorium small vessel disease consistent with long history of migraine headaches,  She also had a history of pituitary adenoma, status post transsphenoidal resection  She also has significant stress at home  Botox injection for chronic migraine prevention, injection was performed according to [place] protocol,  5 units of Botox was injected into each side, for 31 injection sites, total of 155 units  Bilateral frontalis 4 injection sites Procerus 1 injection sites. Bilateral temporalis 8 injection sites Bilateral occipitalis 6 injection sites Bilateral cervical paraspinals 4 injection sites Bilateral upper trapezius 6 injection sites  Extra 55 unites were injected into right parietal, temporal and bilateral  Upper cervical paraspinal regions   Marcial Pacas, M.D. Ph.D  [facility] [address], [place], [place] [address] Ph: [phone] Fax: [phone]  CC: Sandi Mariscal

## 2019-01-20 NOTE — Progress Notes (Signed)
**  Botox 100 units x 2 vials, NDC 2174-7159-53, Lot [id number], Exp 06/2021, office supply.//mck,[email or web address]**

## 2019-01-22 ENCOUNTER — Encounter

## 2019-01-22 ENCOUNTER — Ambulatory Visit: Payer: Medicare Other | Admitting: Neurology

## 2019-04-23 ENCOUNTER — Ambulatory Visit (INDEPENDENT_AMBULATORY_CARE_PROVIDER_SITE_OTHER): Payer: Medicare Other | Admitting: Neurology

## 2019-04-23 ENCOUNTER — Other Ambulatory Visit: Payer: Self-pay

## 2019-04-23 ENCOUNTER — Encounter: Payer: Self-pay | Admitting: Neurology

## 2019-04-23 VITALS — BP 129/74 | HR 68 | Ht 66.0 in | Wt 159.0 lb

## 2019-04-23 DIAGNOSIS — G43709 Chronic migraine without aura, not intractable, without status migrainosus: Secondary | ICD-10-CM | POA: Diagnosis not present

## 2019-04-23 DIAGNOSIS — IMO0002 Reserved for concepts with insufficient information to code with codable children: Secondary | ICD-10-CM

## 2019-04-23 NOTE — Progress Notes (Signed)
No chief complaint on file.     PATIENT: [name] [name] DOB: [birth year]    HISTORICAL  [name] [name] is a 43 years old right-handed female, seen in refer by primary care physician Dr. Nancy Fetter for evaluation of frequent headaches  I reviewed and summarized office note, she had a history of pituitary adenoma, had trans-sphenoid pituitary adenoma resection surgery at [facility] in 1999, prior to the surgery, she had breast discharge, frequent migraine headache with aura, occasionally double vision, most recent MRI of the brain in record was in 2001, there was no evidence of recurrent tumor.   She was a patient of our clinic in 2008 by Dr. Gaynell Face, for chronic migraine, she was also evaluated by [facility] in past, per record, she has tried and failed multiple preventive medications, Topamax, Depakote, nortriptyline, gabapentin, Lyrica, Inderal she has difficulty tolerating the medications  magnesium oxide, riboflavin, coenzyme Q 10 the-counter medications, Relpax, also tried dietary supplement magnesium, riboflavin, coenzyme Q 10,  For abortive treatment, she also tried and failed Maxalt, Relpax, Zomig, Frova, Imitrex tablet, she is currently taking Imitrex nasal spray as needed, it helped her about 50% of the time if she take the medicine during early onset of headaches  since her most recent pregnancy in 2014, she began to have gradual worsening migraine headaches, since 2015, she has about 5-6 migraine headaches each week, trigger for her migraines are strong smells, bright light, food additives, hungry, stress, exertion, smoking  Her typical migraine are right retro-orbital area severe pounding headache with associated light noise sensitivity, nauseous, lasting one day,  For now, she was receiving Botox injection with Dr. [name] at [facility] since 2014, which did helped her migraine, but has difficulty affording the bills  I have reviewed MRI of lumbar reported without contrast  from [facility] February 05 2015, multilevel degenerative disc disease, no significant foraminal or canal stenosis. Laboratory result August 30 first 2016, low normal TSH 0.7, normal CMP, with creatinine 0.8, vitamin D was 29.2, T3 total was within normal limits 85.5, normal CBC with hemoglobin of 12 point 3, normal PTH, free T4, UDS was negative  UPDATE Nov 03 2015: She has tried verapamil 40 mg twice a day for 3 weeks, complains of GI side effect, dizziness, is no longer taking it, it did not help her headache either. Insurance would not cover [place], she is now taking Imitrex nasal spray or tablet 100mg  1-2/week, oxycodone 5mg  prn (5 days a week), Flexeril 10mg  as needed for headaches,  We have reviewed MRI of the brain with and without contrast: Postoperative changes with in the sphenoid and sella turcica consistent with transsphenoidal pituitary microadenoma resection in the past. The pituitary gland has a normal signal, currently. There are several small subcortical T2/FLAIR hyperintense foci in the frontal lobes. This is a nonspecific finding but could be due to the patient's history of migraine and could also represent chronic microvascular ischemic change.   She still has headaches daily, couple times each week, her headache evolved to a more severe prolonged migraine headaches, her headache is usually  triggered by smells, muscle tightness at her neck and shoulder, hungry  UPDATE Mar 14 2016:  Patient has received Botox injection through: Rehabilitation Dr. [name] office for many years, reported mild neck extension weakness during her first injection around 2014, most recent injection was in March 2016, she received 160 units, denies significant side effect, she reported significant improvement of her migraine headaches with Botox injection  UPDATE  June 21 2016: Today she came in complains of whole body achy pain, under a lot of stress at home, reported a history of tick bite in 2002  to left thigh, with bull's eye skin lesion, now concern of possibility of Lyme disease  I have personally reviewed MRI of the brain with and without contrast in Dec 2016,  1.   Postoperative changes with in the sphenoid and sella turcica consistent with transsphenoidal pituitary microadenoma resection in the past. The pituitary gland has a normal signal, currently. 2.   There are several small subcortical T2/FLAIR hyperintense foci in the frontal lobes. This is a nonspecific finding but could be due to the patient's history of migraine and could also represent chronic microvascular ischemic change. Demyelination is less likely. The brain is otherwise normal. 3.   There are no acute findings.  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   Postoperative changes with in the sphenoid and sella turcica consistent with transsphenoidal pituitary microadenoma resection in the past. The pituitary gland has a normal signal, currently. 2.   There are several small subcortical T2/FLAIR hyperintense foci in the frontal lobes. This is a nonspecific finding but could be  due to the patient's history of migraine and could also represent chronic microvascular ischemic change. Demyelination is less likely. The brain is otherwise normal. 3.   There are no acute findings.  We went over the possible medication treatment in detail,  UPDATE Oct 23 2017: Last injection was in April 2018, she responded very well, because of the insurance reasons, has such delay of repeat Botox injection, also complains of generalized fatigue, difficulty focusing,  UPDATE Nov 21 2017: 2 weeks after his injection on October 23, 2017, also under the extreme family stress, she woke up one day severe right-sided headache, noted right-sided droopy eyelid, intermittent blurry vision, her right side blurred vision but overall has much improved, but still has noticeable difference on examination  MRI of the brain on November 15, 2017 showed mild supratentorium small vessel disease consistent with a long history of migraine,  She complains of frequent migraine headaches, but this is in the setting of extreme family tension,  UPDATE Nov 20 2018: Following Ajovy injection in April 2019, for 2 consecutive months, it did help her migraine, her migraine become less frequent, less severe, previously Botox was very helpful, she decided to stop Botox for a while because developed droopy eyelid on 1 injection, but today, she wants to go back on Botox as preventive medication,  She is now having headache days 15 days in a month, related to her menstruation cycle, with significant light noise smells sensitivity, she is  taking Fioricet, Imitrex, Flexeril as needed, she also complains of frequent neck pain, tension,  UPDATE April 23 2019: She continue have significant headaches, contributed to the anxiety, complains of snoring, previously has the diagnosis of obstructive sleep apnea  REVIEW OF SYSTEMS: Full 14 system review of systems performed and notable only for as above All rest review of the system were  negative  ALLERGIES: Allergies  Allergen Reactions  . Latex Rash  . Verapamil Nausea Only    Headache, dizziness    HOME MEDICATIONS: Current Outpatient Prescriptions  Medication Sig Dispense Refill  . buPROPion (WELLBUTRIN SR) 150 MG 12 hr tablet Take 150 mg by mouth daily.  2  . cyclobenzaprine (FLEXERIL) 5 MG tablet Take 5 mg by mouth 3 (three) times daily as needed.  0  . lamoTRIgine (LAMICTAL) 100 MG tablet 1/2 po bid xone week, then one po bid 60 tablet 11  . Multiple Vitamin (MULTIVITAMIN) capsule Take 1 capsule by mouth daily.    Marland Kitchen omega-3 acid ethyl esters (LOVAZA) 1 g capsule Take 1 capsule (1 g total) by mouth daily. 30 capsule 6  . oxyCODONE (OXY IR/ROXICODONE) 5 MG immediate release tablet Take 5 mg by mouth as needed.  0  . SUMAtriptan (IMITREX) 20 MG/ACT nasal spray Place 1 spray (20 mg total) into the nose once. May repeat in 2 hours if headache persists or recurs. 12 Inhaler 6   No current facility-administered medications for this visit.    PAST MEDICAL HISTORY: Past Medical History  Diagnosis Date  . Seasonal allergies   . GERD (gastroesophageal reflux disease)   . IBS (irritable bowel syndrome)   . Adenomatous colon polyp 2008  . Constipation   . Sciatica   . Hematochezia   . Internal hemorrhoids   . 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 Complication of anesthesia     "didn't wake up well/remembered stuff from during OR when I had pituitary surgery"  . DVT (deep vein thrombosis) in pregnancy 09/2013    "LLE; postpartum"  . Sleep apnea     "don't currently use CPAP" (07/28/2014)  . Prolactin secreting pituitary adenoma ([place]) 1999  . H/O hiatal hernia   . Migraines     "couple times/wk" (07/28/2014)  . Arthritis     "jaw joints; neck" (07/28/2014)  . Fibromyalgia   . Depression     hx  . Anxiety 1999/2000  . Panic disorder 1999/2000    PAST SURGICAL HISTORY: Past Surgical History  Procedure Laterality Date  . Transphenoidal / transnasal  hypophysectomy / resection pituitary tumor  1999    resection of benign pituitary tumor, [facility]  . Colonoscopy  01/03/2007    Dr. Silvano Rusk  . Esophagogastroduodenoscopy  09/29/2004    Dr. Kennedy Bucker  . Uterine fibroid surgery  2008  . Appendectomy  07/28/2014  . Laparoscopic appendectomy N/A 07/28/2014    Procedure: APPENDECTOMY LAPAROSCOPIC;  Surgeon: Autumn Messing III, MD;  Location: [facility] OR;  Service: General;  Laterality: N/A;    FAMILY HISTORY: Family History  Problem Relation Age of Onset  . Heart disease Mother   . Diabetes Mother   . Heart disease Father   . Prostate cancer Father   . Heart disease    . Prostate cancer    . Diabetes    . Colon cancer Maternal Grandmother   . Alzheimer's disease Father     SOCIAL HISTORY:  Social History   Social History  . Marital Status: Married  Spouse Name: N/A  . Number of Children: 3  . Years of Education: College   Occupational History  . Homemaker    Social History Main Topics  . Smoking status: Never Smoker   . Smokeless tobacco: Never Used  . Alcohol Use: 0.6 oz/week    1 Glasses of wine per week     Comment: Rarely - less than one drink every few months  . Drug Use: No  . Sexual Activity: Yes   Other Topics Concern  . Not on file   Social History Narrative   Lives at home with her husband and three children.   Right-handed.   Rare caffeine use.     PHYSICAL EXAM   There were no vitals filed for this visit.  Not recorded      ASSESSMENT AND PLAN  [name] [name] is a 43 y.o. female    Chronic migraine headaches  Tried and failed multiple different preventive medications in the past, Topamax, lamotrigine nortriptyline, Inderal, gabapentin, Lyrica, verapmil could not tolerate multiple medications due to side effect   Continue Maxalt dissolvable, plus Zofran,Aleve, Flexeril as needed for moderate migraine  Imitrex subcutaneous injection may be used for more severe migraine  She was  receiving Botox injection every 3-4 months, which has provided moderate relief of her migraine headaches, but she developed right-sided droopy eyelid 2 weeks following her most recent injection on October 23, 2017, recover to baseline after 3 to 4 weeks,  She tried Ajovy injection in April and May 2019, reported significant improvement, after discussed with patient, she wants to go back on Botox as migraine prevention  repeat MRI of the brain showed no acute abnormality, right side supratentorium small vessel disease consistent with long history of migraine headaches,  She also had a history of pituitary adenoma, status post transsphenoidal resection  She also has significant stress at home  Botox injection for chronic migraine prevention, injection was performed according to [place] protocol,  5 units of Botox was injected into each side, for 31 injection sites, total of 155 units  Bilateral corrugate 2 injection sites. Bilateral frontalis 4 injection sites Procerus 1 injection sites. Bilateral temporalis 8 injection sites Bilateral occipitalis 6 injection sites Bilateral cervical paraspinals 4 injection sites Bilateral upper trapezius 6 injection sites  Extra 45 unites were injected into right parietal, temporal and bilateral  Upper cervical paraspinal regions   Marcial Pacas, M.D. Ph.D  [facility] [address], [place], [place] [address] Ph: [phone] Fax: [phone]  CC: Sandi Mariscal

## 2019-04-23 NOTE — Progress Notes (Signed)
**  Botox 200 unit vial x 1, NDC 0086-7619-50, Lot [id number], Exp 12/2021, office supply.//mck,rn**

## 2019-07-22 ENCOUNTER — Other Ambulatory Visit: Payer: Self-pay

## 2019-07-22 ENCOUNTER — Ambulatory Visit (INDEPENDENT_AMBULATORY_CARE_PROVIDER_SITE_OTHER): Payer: Medicare Other | Admitting: Neurology

## 2019-07-22 ENCOUNTER — Encounter: Payer: Self-pay | Admitting: Neurology

## 2019-07-22 VITALS — BP 124/81 | HR 90 | Temp 97.8°F | Ht 66.0 in | Wt 157.0 lb

## 2019-07-22 DIAGNOSIS — G43709 Chronic migraine without aura, not intractable, without status migrainosus: Secondary | ICD-10-CM

## 2019-07-22 DIAGNOSIS — IMO0002 Reserved for concepts with insufficient information to code with codable children: Secondary | ICD-10-CM

## 2019-07-22 MED ORDER — ONABOTULINUMTOXINA 100 UNITS IJ SOLR: 200.0000 [IU] | Freq: Once | INTRAMUSCULAR | Status: DC

## 2019-07-22 MED ORDER — ONABOTULINUMTOXINA 100 UNITS IJ SOLR
200.0000 [IU] | Freq: Once | INTRAMUSCULAR | Status: AC
  Administered 2019-07-22: 200 [IU] via INTRAMUSCULAR

## 2019-07-22 NOTE — Progress Notes (Signed)
**  Botox 200 units x 1 vial, [place] [id number], Lot [id number], Exp 02/2022, office supply.//mck,rn**

## 2019-07-22 NOTE — Progress Notes (Signed)
No chief complaint on file.     PATIENT: [name] [name] DOB: [birth year]    HISTORICAL  [name] [name] is a 43 years old right-handed female, seen in refer by primary care physician Dr. Nancy Fetter for evaluation of frequent headaches  I reviewed and summarized office note, she had a history of pituitary adenoma, had trans-sphenoid pituitary adenoma resection surgery at [facility] in 1999, prior to the surgery, she had breast discharge, frequent migraine headache with aura, occasionally double vision, most recent MRI of the brain in record was in 2001, there was no evidence of recurrent tumor.   She was a patient of our clinic in 2008 by Dr. Gaynell Face, for chronic migraine, she was also evaluated by [facility] in past, per record, she has tried and failed multiple preventive medications, Topamax, Depakote, nortriptyline, gabapentin, Lyrica, Inderal she has difficulty tolerating the medications  magnesium oxide, riboflavin, coenzyme Q 10 the-counter medications, Relpax, also tried dietary supplement magnesium, riboflavin, coenzyme Q 10,  For abortive treatment, she also tried and failed Maxalt, Relpax, Zomig, Frova, Imitrex tablet, she is currently taking Imitrex nasal spray as needed, it helped her about 50% of the time if she take the medicine during early onset of headaches  since her most recent pregnancy in 2014, she began to have gradual worsening migraine headaches, since 2015, she has about 5-6 migraine headaches each week, trigger for her migraines are strong smells, bright light, food additives, hungry, stress, exertion, smoking  Her typical migraine are right retro-orbital area severe pounding headache with associated light noise sensitivity, nauseous, lasting one day,  For now, she was receiving Botox injection with Dr. [name] at [facility] since 2014, which did helped her migraine, but has difficulty affording the bills  I have reviewed MRI of lumbar reported without contrast  from [facility] February 05 2015, multilevel degenerative disc disease, no significant foraminal or canal stenosis. Laboratory result August 30 first 2016, low normal TSH 0.7, normal CMP, with creatinine 0.8, vitamin D was 29.2, T3 total was within normal limits 85.5, normal CBC with hemoglobin of 12 point 3, normal PTH, free T4, UDS was negative  UPDATE Nov 03 2015: She has tried verapamil 40 mg twice a day for 3 weeks, complains of GI side effect, dizziness, is no longer taking it, it did not help her headache either. Insurance would not cover [place], she is now taking Imitrex nasal spray or tablet 100mg  1-2/week, oxycodone 5mg  prn (5 days a week), Flexeril 10mg  as needed for headaches,  We have reviewed MRI of the brain with and without contrast: Postoperative changes with in the sphenoid and sella turcica consistent with transsphenoidal pituitary microadenoma resection in the past. The pituitary gland has a normal signal, currently. There are several small subcortical T2/FLAIR hyperintense foci in the frontal lobes. This is a nonspecific finding but could be due to the patient's history of migraine and could also represent chronic microvascular ischemic change.   She still has headaches daily, couple times each week, her headache evolved to a more severe prolonged migraine headaches, her headache is usually  triggered by smells, muscle tightness at her neck and shoulder, hungry  UPDATE Mar 14 2016:  Patient has received Botox injection through: Rehabilitation Dr. [name] office for many years, reported mild neck extension weakness during her first injection around 2014, most recent injection was in March 2016, she received 160 units, denies significant side effect, she reported significant improvement of her migraine headaches with Botox injection  UPDATE  June 21 2016: Today she came in complains of whole body achy pain, under a lot of stress at home, reported a history of tick bite in 2002  to left thigh, with bull's eye skin lesion, now concern of possibility of Lyme disease  I have personally reviewed MRI of the brain with and without contrast in Dec 2016,  1.   Postoperative changes with in the sphenoid and sella turcica consistent with transsphenoidal pituitary microadenoma resection in the past. The pituitary gland has a normal signal, currently. 2.   There are several small subcortical T2/FLAIR hyperintense foci in the frontal lobes. This is a nonspecific finding but could be due to the patient's history of migraine and could also represent chronic microvascular ischemic change. Demyelination is less likely. The brain is otherwise normal. 3.   There are no acute findings.  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   Postoperative changes with in the sphenoid and sella turcica consistent with transsphenoidal pituitary microadenoma resection in the past. The pituitary gland has a normal signal, currently. 2.   There are several small subcortical T2/FLAIR hyperintense foci in the frontal lobes. This is a nonspecific finding but could be  due to the patient's history of migraine and could also represent chronic microvascular ischemic change. Demyelination is less likely. The brain is otherwise normal. 3.   There are no acute findings.  We went over the possible medication treatment in detail,  UPDATE Oct 23 2017: Last injection was in April 2018, she responded very well, because of the insurance reasons, has such delay of repeat Botox injection, also complains of generalized fatigue, difficulty focusing,  UPDATE Nov 21 2017: 2 weeks after his injection on October 23, 2017, also under the extreme family stress, she woke up one day severe right-sided headache, noted right-sided droopy eyelid, intermittent blurry vision, her right side blurred vision but overall has much improved, but still has noticeable difference on examination  MRI of the brain on November 15, 2017 showed mild supratentorium small vessel disease consistent with a long history of migraine,  She complains of frequent migraine headaches, but this is in the setting of extreme family tension,  UPDATE Nov 20 2018: Following Ajovy injection in April 2019, for 2 consecutive months, it did help her migraine, her migraine become less frequent, less severe, previously Botox was very helpful, she decided to stop Botox for a while because developed droopy eyelid on 1 injection, but today, she wants to go back on Botox as preventive medication,  She is now having headache days 15 days in a month, related to her menstruation cycle, with significant light noise smells sensitivity, she is  taking Fioricet, Imitrex, Flexeril as needed, she also complains of frequent neck pain, tension,  UPDATE April 23 2019: She continue have significant headaches, contributed to the anxiety, complains of snoring, previously has the diagnosis of obstructive sleep apnea  UPDATE Sept 30 2020: She continues to have frequent headaches, Botox injection was helpful  REVIEW OF SYSTEMS: Full 14 system  review of systems performed and notable only for as above All rest review of the system were negative  ALLERGIES: Allergies  Allergen Reactions  . Latex Rash  . Verapamil Nausea Only    Headache, dizziness    HOME MEDICATIONS: Current Outpatient Prescriptions  Medication Sig Dispense Refill  . buPROPion (WELLBUTRIN SR) 150 MG 12 hr tablet Take 150 mg by mouth daily.  2  . cyclobenzaprine (FLEXERIL) 5 MG tablet Take 5 mg by mouth 3 (three) times daily as needed.  0  . lamoTRIgine (LAMICTAL) 100 MG tablet 1/2 po bid xone week, then one po bid 60 tablet 11  . Multiple Vitamin (MULTIVITAMIN) capsule Take 1 capsule by mouth daily.    Marland Kitchen omega-3 acid ethyl esters (LOVAZA) 1 g capsule Take 1 capsule (1 g total) by mouth daily. 30 capsule 6  . oxyCODONE (OXY IR/ROXICODONE) 5 MG immediate release tablet Take 5 mg by mouth as needed.  0  . SUMAtriptan (IMITREX) 20 MG/ACT nasal spray Place 1 spray (20 mg total) into the nose once. May repeat in 2 hours if headache persists or recurs. 12 Inhaler 6   No current facility-administered medications for this visit.    PAST MEDICAL HISTORY: Past Medical History  Diagnosis Date  . Seasonal allergies   . GERD (gastroesophageal reflux disease)   . IBS (irritable bowel syndrome)   . Adenomatous colon polyp 2008  . Constipation   . Sciatica   . Hematochezia   . Internal hemorrhoids   . 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 Complication of anesthesia     "didn't wake up well/remembered stuff from during OR when I had pituitary surgery"  . DVT (deep vein thrombosis) in pregnancy 09/2013    "LLE; postpartum"  . Sleep apnea     "don't currently use CPAP" (07/28/2014)  . Prolactin secreting pituitary adenoma ([place]) 1999  . H/O hiatal hernia   . Migraines     "couple times/wk" (07/28/2014)  . Arthritis     "jaw joints; neck" (07/28/2014)  . Fibromyalgia   . Depression     hx  . Anxiety 1999/2000  . Panic disorder 1999/2000    PAST SURGICAL  HISTORY: Past Surgical History  Procedure Laterality Date  . Transphenoidal / transnasal hypophysectomy / resection pituitary tumor  1999    resection of benign pituitary tumor, [facility]  . Colonoscopy  01/03/2007    Dr. Silvano Rusk  . Esophagogastroduodenoscopy  09/29/2004    Dr. Kennedy Bucker  . Uterine fibroid surgery  2008  . Appendectomy  07/28/2014  . Laparoscopic appendectomy N/A 07/28/2014    Procedure: APPENDECTOMY LAPAROSCOPIC;  Surgeon: Autumn Messing III, MD;  Location: [facility] OR;  Service: General;  Laterality: N/A;    FAMILY HISTORY: Family History  Problem Relation Age of Onset  . Heart disease Mother   . Diabetes Mother   . Heart disease Father   . Prostate cancer Father   . Heart disease    . Prostate cancer    . Diabetes    . Colon cancer Maternal Grandmother   . Alzheimer's disease Father  SOCIAL HISTORY:  Social History   Social History  . Marital Status: Married    Spouse Name: N/A  . Number of Children: 3  . Years of Education: College   Occupational History  . Homemaker    Social History Main Topics  . Smoking status: Never Smoker   . Smokeless tobacco: Never Used  . Alcohol Use: 0.6 oz/week    1 Glasses of wine per week     Comment: Rarely - less than one drink every few months  . Drug Use: No  . Sexual Activity: Yes   Other Topics Concern  . Not on file   Social History Narrative   Lives at home with her husband and three children.   Right-handed.   Rare caffeine use.     PHYSICAL EXAM   There were no vitals filed for this visit.  Not recorded      ASSESSMENT AND PLAN  [name] [name] is a 43 y.o. female    Chronic migraine headaches  Tried and failed multiple different preventive medications in the past, Topamax, lamotrigine nortriptyline, Inderal, gabapentin, Lyrica, verapmil could not tolerate multiple medications due to side effect   Continue Maxalt dissolvable, plus Zofran,Aleve, Flexeril as needed for moderate  migraine  Imitrex subcutaneous injection may be used for more severe migraine  She was receiving Botox injection every 3-4 months, which has provided moderate relief of her migraine headaches, but she developed right-sided droopy eyelid 2 weeks following her most recent injection on October 23, 2017, recover to baseline after 3 to 4 weeks,  She tried Ajovy injection in April and May 2019, reported no significant improvement, after discussed with patient, she wants to go back on Botox as migraine prevention  repeat MRI of the brain showed no acute abnormality, right side supratentorium small vessel disease consistent with long history of migraine headaches,  She also had a history of pituitary adenoma, status post transsphenoidal resection  She also has significant stress at home  Botox injection for chronic migraine prevention, injection was performed according to [place] protocol,  5 units of Botox was injected into each side, for 31 injection sites, total of 155 units  Bilateral corrugate 2 injection sites. Bilateral frontalis 4 injection sites Procerus 1 injection sites. Bilateral temporalis 8 injection sites Bilateral occipitalis 6 injection sites Bilateral cervical paraspinals 4 injection sites Bilateral upper trapezius 6 injection sites  Extra 45 unites were injected into bilateral masseters muscle   Marcial Pacas, M.D. Ph.D  [facility] [address], [place], [place] [address] Ph: [phone] Fax: [phone]  CC: Sandi Mariscal

## 2019-10-20 ENCOUNTER — Ambulatory Visit: Payer: Medicare Other | Admitting: Neurology

## 2019-10-20 ENCOUNTER — Encounter: Payer: Self-pay | Admitting: Neurology

## 2019-10-20 ENCOUNTER — Ambulatory Visit (INDEPENDENT_AMBULATORY_CARE_PROVIDER_SITE_OTHER): Payer: Medicare Other | Admitting: Neurology

## 2019-10-20 VITALS — BP 128/84 | HR 88 | Temp 98.2°F | Ht 66.0 in | Wt 164.5 lb

## 2019-10-20 DIAGNOSIS — G43709 Chronic migraine without aura, not intractable, without status migrainosus: Secondary | ICD-10-CM

## 2019-10-20 DIAGNOSIS — IMO0002 Reserved for concepts with insufficient information to code with codable children: Secondary | ICD-10-CM

## 2019-10-20 MED ORDER — ONABOTULINUMTOXINA 100 UNITS IJ SOLR
200.0000 [IU] | Freq: Once | INTRAMUSCULAR | Status: AC
  Administered 2019-10-20: 200 [IU] via INTRAMUSCULAR

## 2019-10-20 NOTE — Progress Notes (Signed)
No chief complaint on file.     PATIENT: BUSHRA DENMAN DOB: [birth year]    HISTORICAL  [name] is a 43 years old right-handed female, seen in refer by primary care physician Dr. Nancy Fetter for evaluation of frequent headaches  I reviewed and summarized office note, she had a history of pituitary adenoma, had trans-sphenoid pituitary adenoma resection surgery at [facility] in 1999, prior to the surgery, she had breast discharge, frequent migraine headache with aura, occasionally double vision, most recent MRI of the brain in record was in 2001, there was no evidence of recurrent tumor.   She was a patient of our clinic in 2008 by Dr. Gaynell Face, for chronic migraine, she was also evaluated by [facility] in past, per record, she has tried and failed multiple preventive medications, Topamax, Depakote, nortriptyline, gabapentin, Lyrica, Inderal she has difficulty tolerating the medications  magnesium oxide, riboflavin, coenzyme Q 10 the-counter medications, Relpax, also tried dietary supplement magnesium, riboflavin, coenzyme Q 10,  For abortive treatment, she also tried and failed Maxalt, Relpax, Zomig, Frova, Imitrex tablet, she is currently taking Imitrex nasal spray as needed, it helped her about 50% of the time if she take the medicine during early onset of headaches  since her most recent pregnancy in 2014, she began to have gradual worsening migraine headaches, since 2015, she has about 5-6 migraine headaches each week, trigger for her migraines are strong smells, bright light, food additives, hungry, stress, exertion, smoking  Her typical migraine are right retro-orbital area severe pounding headache with associated light noise sensitivity, nauseous, lasting one day,  For now, she was receiving Botox injection with Dr. [name] at [facility] since 2014, which did helped her migraine, but has difficulty affording the bills  I have reviewed MRI of lumbar reported without contrast  from [facility] February 05 2015, multilevel degenerative disc disease, no significant foraminal or canal stenosis. Laboratory result August 30 first 2016, low normal TSH 0.7, normal CMP, with creatinine 0.8, vitamin D was 29.2, T3 total was within normal limits 85.5, normal CBC with hemoglobin of 12 point 3, normal PTH, free T4, UDS was negative  UPDATE Nov 03 2015: She has tried verapamil 40 mg twice a day for 3 weeks, complains of GI side effect, dizziness, is no longer taking it, it did not help her headache either. Insurance would not cover [place], she is now taking Imitrex nasal spray or tablet 100mg  1-2/week, oxycodone 5mg  prn (5 days a week), Flexeril 10mg  as needed for headaches,  We have reviewed MRI of the brain with and without contrast: Postoperative changes with in the sphenoid and sella turcica consistent with transsphenoidal pituitary microadenoma resection in the past. The pituitary gland has a normal signal, currently. There are several small subcortical T2/FLAIR hyperintense foci in the frontal lobes. This is a nonspecific finding but could be due to the patient's history of migraine and could also represent chronic microvascular ischemic change.   She still has headaches daily, couple times each week, her headache evolved to a more severe prolonged migraine headaches, her headache is usually  triggered by smells, muscle tightness at her neck and shoulder, hungry  UPDATE Mar 14 2016:  Patient has received Botox injection through: Rehabilitation Dr. [name] office for many years, reported mild neck extension weakness during her first injection around 2014, most recent injection was in March 2016, she received 160 units, denies significant side effect, she reported significant improvement of her migraine headaches with Botox injection  UPDATE  June 21 2016: Today she came in complains of whole body achy pain, under a lot of stress at home, reported a history of tick bite in 2002  to left thigh, with bull's eye skin lesion, now concern of possibility of Lyme disease  I have personally reviewed MRI of the brain with and without contrast in Dec 2016,  1.   Postoperative changes with in the sphenoid and sella turcica consistent with transsphenoidal pituitary microadenoma resection in the past. The pituitary gland has a normal signal, currently. 2.   There are several small subcortical T2/FLAIR hyperintense foci in the frontal lobes. This is a nonspecific finding but could be due to the patient's history of migraine and could also represent chronic microvascular ischemic change. Demyelination is less likely. The brain is otherwise normal. 3.   There are no acute findings.  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   Postoperative changes with in the sphenoid and sella turcica consistent with transsphenoidal pituitary microadenoma resection in the past. The pituitary gland has a normal signal, currently. 2.   There are several small subcortical T2/FLAIR hyperintense foci in the frontal lobes. This is a nonspecific finding but could be  due to the patient's history of migraine and could also represent chronic microvascular ischemic change. Demyelination is less likely. The brain is otherwise normal. 3.   There are no acute findings.  We went over the possible medication treatment in detail,  UPDATE Oct 23 2017: Last injection was in April 2018, she responded very well, because of the insurance reasons, has such delay of repeat Botox injection, also complains of generalized fatigue, difficulty focusing,  UPDATE Nov 21 2017: 2 weeks after his injection on October 23, 2017, also under the extreme family stress, she woke up one day severe right-sided headache, noted right-sided droopy eyelid, intermittent blurry vision, her right side blurred vision but overall has much improved, but still has noticeable difference on examination  MRI of the brain on November 15, 2017 showed mild supratentorium small vessel disease consistent with a long history of migraine,  She complains of frequent migraine headaches, but this is in the setting of extreme family tension,  UPDATE Nov 20 2018: Following Ajovy injection in April 2019, for 2 consecutive months, it did help her migraine, her migraine become less frequent, less severe, previously Botox was very helpful, she decided to stop Botox for a while because developed droopy eyelid on 1 injection, but today, she wants to go back on Botox as preventive medication,  She is now having headache days 15 days in a month, related to her menstruation cycle, with significant light noise smells sensitivity, she is  taking Fioricet, Imitrex, Flexeril as needed, she also complains of frequent neck pain, tension,  UPDATE April 23 2019: She continue have significant headaches, contributed to the anxiety, complains of snoring, previously has the diagnosis of obstructive sleep apnea  UPDATE Sept 30 2020: She continues to have frequent headaches, Botox injection was helpful  REVIEW OF SYSTEMS: Full 14 system  review of systems performed and notable only for as above All rest review of the system were negative  ALLERGIES: Allergies  Allergen Reactions  . Latex Rash  . Verapamil Nausea Only    Headache, dizziness    HOME MEDICATIONS: Current Outpatient Prescriptions  Medication Sig Dispense Refill  . buPROPion (WELLBUTRIN SR) 150 MG 12 hr tablet Take 150 mg by mouth daily.  2  . cyclobenzaprine (FLEXERIL) 5 MG tablet Take 5 mg by mouth 3 (three) times daily as needed.  0  . lamoTRIgine (LAMICTAL) 100 MG tablet 1/2 po bid xone week, then one po bid 60 tablet 11  . Multiple Vitamin (MULTIVITAMIN) capsule Take 1 capsule by mouth daily.    Marland Kitchen omega-3 acid ethyl esters (LOVAZA) 1 g capsule Take 1 capsule (1 g total) by mouth daily. 30 capsule 6  . oxyCODONE (OXY IR/ROXICODONE) 5 MG immediate release tablet Take 5 mg by mouth as needed.  0  . SUMAtriptan (IMITREX) 20 MG/ACT nasal spray Place 1 spray (20 mg total) into the nose once. May repeat in 2 hours if headache persists or recurs. 12 Inhaler 6   No current facility-administered medications for this visit.    PAST MEDICAL HISTORY: Past Medical History  Diagnosis Date  . Seasonal allergies   . GERD (gastroesophageal reflux disease)   . IBS (irritable bowel syndrome)   . Adenomatous colon polyp 2008  . Constipation   . Sciatica   . Hematochezia   . Internal hemorrhoids   . 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 Complication of anesthesia     "didn't wake up well/remembered stuff from during OR when I had pituitary surgery"  . DVT (deep vein thrombosis) in pregnancy 09/2013    "LLE; postpartum"  . Sleep apnea     "don't currently use CPAP" (07/28/2014)  . Prolactin secreting pituitary adenoma ([place]) 1999  . H/O hiatal hernia   . Migraines     "couple times/wk" (07/28/2014)  . Arthritis     "jaw joints; neck" (07/28/2014)  . Fibromyalgia   . Depression     hx  . Anxiety 1999/2000  . Panic disorder 1999/2000    PAST SURGICAL  HISTORY: Past Surgical History  Procedure Laterality Date  . Transphenoidal / transnasal hypophysectomy / resection pituitary tumor  1999    resection of benign pituitary tumor, [facility]  . Colonoscopy  01/03/2007    Dr. Silvano Rusk  . Esophagogastroduodenoscopy  09/29/2004    Dr. Kennedy Bucker  . Uterine fibroid surgery  2008  . Appendectomy  07/28/2014  . Laparoscopic appendectomy N/A 07/28/2014    Procedure: APPENDECTOMY LAPAROSCOPIC;  Surgeon: Autumn Messing III, MD;  Location: [facility] OR;  Service: General;  Laterality: N/A;    FAMILY HISTORY: Family History  Problem Relation Age of Onset  . Heart disease Mother   . Diabetes Mother   . Heart disease Father   . Prostate cancer Father   . Heart disease    . Prostate cancer    . Diabetes    . Colon cancer Maternal Grandmother   . Alzheimer's disease Father  SOCIAL HISTORY:  Social History   Social History  . Marital Status: Married    Spouse Name: N/A  . Number of Children: 3  . Years of Education: College   Occupational History  . Homemaker    Social History Main Topics  . Smoking status: Never Smoker   . Smokeless tobacco: Never Used  . Alcohol Use: 0.6 oz/week    1 Glasses of wine per week     Comment: Rarely - less than one drink every few months  . Drug Use: No  . Sexual Activity: Yes   Other Topics Concern  . Not on file   Social History Narrative   Lives at home with her husband and three children.   Right-handed.   Rare caffeine use.     PHYSICAL EXAM   There were no vitals filed for this visit.  Not recorded      ASSESSMENT AND PLAN  YISSEL SPINELLA is a 43 y.o. female    Chronic migraine headaches  Tried and failed multiple different preventive medications in the past, Topamax, lamotrigine nortriptyline, Inderal, gabapentin, Lyrica, verapmil could not tolerate multiple medications due to side effect   Continue Maxalt dissolvable, plus Zofran,Aleve, Flexeril as needed for moderate  migraine  Imitrex subcutaneous injection may be used for more severe migraine  She was receiving Botox injection every 3-4 months, which has provided moderate relief of her migraine headaches, but she developed right-sided droopy eyelid 2 weeks following her most recent injection on October 23, 2017, recover to baseline after 3 to 4 weeks,  She tried Ajovy injection in April and May 2019, reported no significant improvement, after discussed with patient, she wants to go back on Botox as migraine prevention  repeat MRI of the brain showed no acute abnormality, right side supratentorium small vessel disease consistent with long history of migraine headaches,  She also had a history of pituitary adenoma, status post transsphenoidal resection  She also has significant stress at home  Botox injection for chronic migraine prevention, injection was performed according to [place] protocol,  5 units of Botox was injected into each side, for 31 injection sites, total of 155 units  Bilateral corrugate 2 injection sites. Bilateral frontalis 4 injection sites Procerus 1 injection sites. Bilateral temporalis 8 injection sites Bilateral occipitalis 6 injection sites Bilateral cervical paraspinals 4 injection sites Bilateral upper trapezius 6 injection sites  Extra 30 unites were injected into bilateral masseters muscle; 15 units to bilateral parietal region   Marcial Pacas, M.D. Ph.D  [facility] [address] [place], [place] [address] Ph: [phone] Fax: [phone]  CC: Sandi Mariscal

## 2019-10-20 NOTE — Progress Notes (Signed)
**  Botox 200 units x 1 vial, [place] [id number], Lot [id number], Exp 06/2022, office supply.//mck,rn**

## 2019-10-26 ENCOUNTER — Telehealth: Payer: Self-pay | Admitting: *Deleted

## 2019-10-26 NOTE — Telephone Encounter (Signed)
PA for Ajovy approved by [employer] [phone]).  Pt [id number].  Covermymeds key: [id number].  Case: [id number].  Approval valid through 10/21/2020.

## 2020-01-21 ENCOUNTER — Ambulatory Visit: Payer: Medicare Other | Admitting: Neurology

## 2020-03-16 ENCOUNTER — Ambulatory Visit (INDEPENDENT_AMBULATORY_CARE_PROVIDER_SITE_OTHER): Payer: Medicare Other | Admitting: Neurology

## 2020-03-16 ENCOUNTER — Telehealth: Payer: Self-pay | Admitting: Neurology

## 2020-03-16 ENCOUNTER — Encounter: Payer: Self-pay | Admitting: Neurology

## 2020-03-16 ENCOUNTER — Other Ambulatory Visit: Payer: Self-pay

## 2020-03-16 VITALS — BP 113/74 | HR 97 | Ht 66.0 in | Wt 168.5 lb

## 2020-03-16 DIAGNOSIS — G43709 Chronic migraine without aura, not intractable, without status migrainosus: Secondary | ICD-10-CM

## 2020-03-16 DIAGNOSIS — F32A Depression, unspecified: Secondary | ICD-10-CM

## 2020-03-16 DIAGNOSIS — IMO0002 Reserved for concepts with insufficient information to code with codable children: Secondary | ICD-10-CM

## 2020-03-16 MED ORDER — ONABOTULINUMTOXINA 100 UNITS IJ SOLR
200.0000 [IU] | Freq: Once | INTRAMUSCULAR | Status: AC
  Administered 2020-03-16: 200 [IU] via INTRAMUSCULAR

## 2020-03-16 MED ORDER — BUTALBITAL-APAP-CAFFEINE 50-325-40 MG PO TABS: 1.0000 | ORAL_TABLET | Freq: Four times a day (QID) | ORAL | 5 refills | Status: DC | PRN

## 2020-03-16 MED ORDER — VENLAFAXINE HCL ER 37.5 MG PO CP24: 37.5000 mg | ORAL_CAPSULE | Freq: Every day | ORAL | 11 refills | Status: DC

## 2020-03-16 MED ORDER — RIZATRIPTAN BENZOATE 10 MG PO TBDP: 10.0000 mg | ORAL_TABLET | ORAL | 11 refills | Status: DC | PRN

## 2020-03-16 MED ORDER — CITALOPRAM HYDROBROMIDE 20 MG PO TABS: 20.0000 mg | ORAL_TABLET | Freq: Every day | ORAL | 11 refills | Status: DC

## 2020-03-16 NOTE — Progress Notes (Signed)
**  Botox 200 units x 1 vial, [place] [id number], Lot [id number], Exp 11/2022, office supply.//mck,rn**

## 2020-03-16 NOTE — Telephone Encounter (Signed)
Please call patient, Effexor was documented as allergy on her medication list, I instead called in Celexa 20 mg daily for her depression, she may start with half tablets for 1 week, then titrate to 1 tablet daily

## 2020-03-16 NOTE — Telephone Encounter (Signed)
I spoke to the patient. She is aware and agreeable to the medication change.

## 2020-03-16 NOTE — Progress Notes (Signed)
No chief complaint on file.     PATIENT: [name] [name] DOB: [birth year]    HISTORICAL  [name] [name] is a 44 years old right-handed female, seen in refer by primary care physician Dr. Nancy Fetter for evaluation of frequent headaches  I reviewed and summarized office note, she had a history of pituitary adenoma, had trans-sphenoid pituitary adenoma resection surgery at [facility] in 1999, prior to the surgery, she had breast discharge, frequent migraine headache with aura, occasionally double vision, most recent MRI of the brain in record was in 2001, there was no evidence of recurrent tumor.   She was a patient of our clinic in 2008 by Dr. Gaynell Face, for chronic migraine, she was also evaluated by [facility] in past, per record, she has tried and failed multiple preventive medications, Topamax, Depakote, nortriptyline, gabapentin, Lyrica, Inderal she has difficulty tolerating the medications  magnesium oxide, riboflavin, coenzyme Q 10 the-counter medications, Relpax, also tried dietary supplement magnesium, riboflavin, coenzyme Q 10,  For abortive treatment, she also tried and failed Maxalt, Relpax, Zomig, Frova, Imitrex tablet, she is currently taking Imitrex nasal spray as needed, it helped her about 50% of the time if she take the medicine during early onset of headaches  since her most recent pregnancy in 2014, she began to have gradual worsening migraine headaches, since 2015, she has about 5-6 migraine headaches each week, trigger for her migraines are strong smells, bright light, food additives, hungry, stress, exertion, smoking  Her typical migraine are right retro-orbital area severe pounding headache with associated light noise sensitivity, nauseous, lasting one day,  For now, she was receiving Botox injection with Dr. [name] at [facility] since 2014, which did helped her migraine, but has difficulty affording the bills  I have reviewed MRI of lumbar reported without contrast  from [facility] February 05 2015, multilevel degenerative disc disease, no significant foraminal or canal stenosis. Laboratory result August 30 first 2016, low normal TSH 0.7, normal CMP, with creatinine 0.8, vitamin D was 29.2, T3 total was within normal limits 85.5, normal CBC with hemoglobin of 12 point 3, normal PTH, free T4, UDS was negative  UPDATE Nov 03 2015: She has tried verapamil 40 mg twice a day for 3 weeks, complains of GI side effect, dizziness, is no longer taking it, it did not help her headache either. Insurance would not cover [place], she is now taking Imitrex nasal spray or tablet 100mg  1-2/week, oxycodone 5mg  prn (5 days a week), Flexeril 10mg  as needed for headaches,  We have reviewed MRI of the brain with and without contrast: Postoperative changes with in the sphenoid and sella turcica consistent with transsphenoidal pituitary microadenoma resection in the past. The pituitary gland has a normal signal, currently. There are several small subcortical T2/FLAIR hyperintense foci in the frontal lobes. This is a nonspecific finding but could be due to the patient's history of migraine and could also represent chronic microvascular ischemic change.   She still has headaches daily, couple times each week, her headache evolved to a more severe prolonged migraine headaches, her headache is usually  triggered by smells, muscle tightness at her neck and shoulder, hungry  UPDATE Mar 14 2016:  Patient has received Botox injection through: Rehabilitation Dr. [name] office for many years, reported mild neck extension weakness during her first injection around 2014, most recent injection was in March 2016, she received 160 units, denies significant side effect, she reported significant improvement of her migraine headaches with Botox injection  UPDATE  June 21 2016: Today she came in complains of whole body achy pain, under a lot of stress at home, reported a history of tick bite in 2002  to left thigh, with bull's eye skin lesion, now concern of possibility of Lyme disease  I have personally reviewed MRI of the brain with and without contrast in Dec 2016,  1.   Postoperative changes with in the sphenoid and sella turcica consistent with transsphenoidal pituitary microadenoma resection in the past. The pituitary gland has a normal signal, currently. 2.   There are several small subcortical T2/FLAIR hyperintense foci in the frontal lobes. This is a nonspecific finding but could be due to the patient's history of migraine and could also represent chronic microvascular ischemic change. Demyelination is less likely. The brain is otherwise normal. 3.   There are no acute findings.  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   Postoperative changes with in the sphenoid and sella turcica consistent with transsphenoidal pituitary microadenoma resection in the past. The pituitary gland has a normal signal, currently. 2.   There are several small subcortical T2/FLAIR hyperintense foci in the frontal lobes. This is a nonspecific finding but could be  due to the patient's history of migraine and could also represent chronic microvascular ischemic change. Demyelination is less likely. The brain is otherwise normal. 3.   There are no acute findings.  We went over the possible medication treatment in detail,  UPDATE Oct 23 2017: Last injection was in April 2018, she responded very well, because of the insurance reasons, has such delay of repeat Botox injection, also complains of generalized fatigue, difficulty focusing,  UPDATE Nov 21 2017: 2 weeks after his injection on October 23, 2017, also under the extreme family stress, she woke up one day severe right-sided headache, noted right-sided droopy eyelid, intermittent blurry vision, her right side blurred vision but overall has much improved, but still has noticeable difference on examination  MRI of the brain on November 15, 2017 showed mild supratentorium small vessel disease consistent with a long history of migraine,  She complains of frequent migraine headaches, but this is in the setting of extreme family tension,  UPDATE Nov 20 2018: Following Ajovy injection in April 2019, for 2 consecutive months, it did help her migraine, her migraine become less frequent, less severe, previously Botox was very helpful, she decided to stop Botox for a while because developed droopy eyelid on 1 injection, but today, she wants to go back on Botox as preventive medication,  She is now having headache days 15 days in a month, related to her menstruation cycle, with significant light noise smells sensitivity, she is  taking Fioricet, Imitrex, Flexeril as needed, she also complains of frequent neck pain, tension,  UPDATE April 23 2019: She continue have significant headaches, contributed to the anxiety, complains of snoring, previously has the diagnosis of obstructive sleep apnea  UPDATE Sept 30 2020: She continues to have frequent headaches, Botox injection was helpful  UPDATE Mar 16 2020: Her whole family  suffered COVID-19 infection at the end of March 2021, since then she noticed increased fatigue, forgetfulness, memory loss, frequent headaches during headache, nausea has improved  She is taking Fioricet, Maxalt as needed,  REVIEW OF SYSTEMS: Full 14 system review of systems performed and notable only for as above All rest review of the system were negative  ALLERGIES: Allergies  Allergen Reactions  . Latex Rash  . Verapamil Nausea Only    Headache, dizziness    HOME MEDICATIONS: Current Outpatient Prescriptions  Medication Sig Dispense Refill  . buPROPion (WELLBUTRIN SR) 150 MG 12 hr tablet Take 150 mg by mouth daily.  2  . cyclobenzaprine (FLEXERIL) 5 MG tablet Take 5 mg by mouth 3 (three) times daily as needed.  0  . lamoTRIgine (LAMICTAL) 100 MG tablet 1/2 po bid xone week, then one po bid 60 tablet 11  . Multiple Vitamin (MULTIVITAMIN) capsule Take 1 capsule by mouth daily.    Marland Kitchen omega-3 acid ethyl esters (LOVAZA) 1 g capsule Take 1 capsule (1 g total) by mouth daily. 30 capsule 6  . oxyCODONE (OXY IR/ROXICODONE) 5 MG immediate release tablet Take 5 mg by mouth as needed.  0  . SUMAtriptan (IMITREX) 20 MG/ACT nasal spray Place 1 spray (20 mg total) into the nose once. May repeat in 2 hours if headache persists or recurs. 12 Inhaler 6   No current facility-administered medications for this visit.    PAST MEDICAL HISTORY: Past Medical History  Diagnosis Date  . Seasonal allergies   . GERD (gastroesophageal reflux disease)   . IBS (irritable bowel syndrome)   . Adenomatous colon polyp 2008  . Constipation   . Sciatica   . Hematochezia   . Internal hemorrhoids   . 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 Complication of anesthesia     "didn't wake up well/remembered stuff from during OR when I had pituitary surgery"  . DVT (deep vein thrombosis) in pregnancy 09/2013    "LLE; postpartum"  . Sleep apnea     "don't currently use CPAP" (07/28/2014)  . Prolactin secreting  pituitary adenoma ([place]) 1999  . H/O hiatal hernia   . Migraines     "couple times/wk" (07/28/2014)  . Arthritis     "jaw joints; neck" (07/28/2014)  . Fibromyalgia   . Depression     hx  . Anxiety 1999/2000  . Panic disorder 1999/2000    PAST SURGICAL HISTORY: Past Surgical History  Procedure Laterality Date  . Transphenoidal / transnasal hypophysectomy / resection pituitary tumor  1999    resection of benign pituitary tumor, [facility]  . Colonoscopy  01/03/2007    Dr. Silvano Rusk  . Esophagogastroduodenoscopy  09/29/2004    Dr. Kennedy Bucker  . Uterine fibroid surgery  2008  . Appendectomy  07/28/2014  . Laparoscopic appendectomy N/A 07/28/2014    Procedure: APPENDECTOMY LAPAROSCOPIC;  Surgeon: Autumn Messing III, MD;  Location: [facility] OR;  Service: General;  Laterality: N/A;    FAMILY HISTORY: Family History  Problem Relation Age of Onset  . Heart disease Mother   . Diabetes Mother   . Heart  disease Father   . Prostate cancer Father   . Heart disease    . Prostate cancer    . Diabetes    . Colon cancer Maternal Grandmother   . Alzheimer's disease Father     SOCIAL HISTORY:  Social History   Social History  . Marital Status: Married    Spouse Name: N/A  . Number of Children: 3  . Years of Education: College   Occupational History  . Homemaker    Social History Main Topics  . Smoking status: Never Smoker   . Smokeless tobacco: Never Used  . Alcohol Use: 0.6 oz/week    1 Glasses of wine per week     Comment: Rarely - less than one drink every few months  . Drug Use: No  . Sexual Activity: Yes   Other Topics Concern  . Not on file   Social History Narrative   Lives at home with her husband and three children.   Right-handed.   Rare caffeine use.     PHYSICAL EXAM   There were no vitals filed for this visit.  Not recorded      ASSESSMENT AND PLAN  DARYLE WYKOFF is a 44 y.o. female    Chronic migraine headaches  Tried and failed multiple  different preventive medications in the past, Topamax, lamotrigine nortriptyline, Inderal, gabapentin, Lyrica, verapmil could not tolerate multiple medications due to side effect   Continue Maxalt dissolvable, plus Zofran,Aleve, Flexeril as needed for moderate migraine  Imitrex subcutaneous injection may be used for more severe migraine  She was receiving Botox injection every 3-4 months, which has provided moderate relief of her migraine headaches, but she developed right-sided droopy eyelid 2 weeks following her most recent injection on October 23, 2017, recover to baseline after 3 to 4 weeks,  She tried Ajovy injection in April and May 2019, reported no significant improvement, after discussed with patient, she wants to go back on Botox as migraine prevention  repeat MRI of the brain showed no acute abnormality, right side supratentorium small vessel disease consistent with long history of migraine headaches,  She also had a history of pituitary adenoma, status post transsphenoidal resection  She also has significant stress at home  Botox injection for chronic migraine prevention, injection was performed according to [place] protocol,  5 units of Botox was injected into each side, for 31 injection sites, total of 155 units  Bilateral corrugate 2 injection sites. Bilateral frontalis 4 injection sites Procerus 1 injection sites. Bilateral temporalis 8 injection sites Bilateral occipitalis 6 injection sites Bilateral cervical paraspinals 4 injection sites Bilateral upper trapezius 6 injection sites  Extra 45 unites were injected into bilateral upper cervical, trapezius, levator scapular  celexa 20mg  daily for depression I also ERx, Maxalt 10mg  prn and Fioricet prn  Marcial Pacas, M.D. Ph.D  [facility] [address] [place],  [address] Ph: [phone] Fax: [phone]  CC: Sandi Mariscal

## 2020-06-22 ENCOUNTER — Ambulatory Visit: Payer: Medicare Other | Admitting: Neurology

## 2020-07-13 ENCOUNTER — Ambulatory Visit (INDEPENDENT_AMBULATORY_CARE_PROVIDER_SITE_OTHER): Payer: Medicare Other | Admitting: Neurology

## 2020-07-13 ENCOUNTER — Encounter: Payer: Self-pay | Admitting: Neurology

## 2020-07-13 VITALS — BP 109/62 | HR 97 | Ht 66.0 in | Wt 155.8 lb

## 2020-07-13 DIAGNOSIS — G43709 Chronic migraine without aura, not intractable, without status migrainosus: Secondary | ICD-10-CM

## 2020-07-13 DIAGNOSIS — IMO0002 Reserved for concepts with insufficient information to code with codable children: Secondary | ICD-10-CM

## 2020-07-13 MED ORDER — ONABOTULINUMTOXINA 100 UNITS IJ SOLR
200.0000 [IU] | Freq: Once | INTRAMUSCULAR | Status: AC
  Administered 2020-07-13: 200 [IU] via INTRAMUSCULAR

## 2020-07-13 NOTE — Progress Notes (Signed)
No chief complaint on file.     PATIENT: [name] [name] DOB: [birth year]    HISTORICAL  [name] [name] is a 44 years old right-handed female, seen in refer by primary care physician Dr. Nancy Fetter for evaluation of frequent headaches  I reviewed and summarized office note, she had a history of pituitary adenoma, had trans-sphenoid pituitary adenoma resection surgery at [facility] in 1999, prior to the surgery, she had breast discharge, frequent migraine headache with aura, occasionally double vision, most recent MRI of the brain in record was in 2001, there was no evidence of recurrent tumor.   She was a patient of our clinic in 2008 by Dr. Gaynell Face, for chronic migraine, she was also evaluated by [facility] in past, per record, she has tried and failed multiple preventive medications, Topamax, Depakote, nortriptyline, gabapentin, Lyrica, Inderal she has difficulty tolerating the medications  magnesium oxide, riboflavin, coenzyme Q 10 the-counter medications, Relpax, also tried dietary supplement magnesium, riboflavin, coenzyme Q 10,  For abortive treatment, she also tried and failed Maxalt, Relpax, Zomig, Frova, Imitrex tablet, she is currently taking Imitrex nasal spray as needed, it helped her about 50% of the time if she take the medicine during early onset of headaches  since her most recent pregnancy in 2014, she began to have gradual worsening migraine headaches, since 2015, she has about 5-6 migraine headaches each week, trigger for her migraines are strong smells, bright light, food additives, hungry, stress, exertion, smoking  Her typical migraine are right retro-orbital area severe pounding headache with associated light noise sensitivity, nauseous, lasting one day,  For now, she was receiving Botox injection with Dr. [name] at [facility] since 2014, which did helped her migraine, but has difficulty affording the bills  I have reviewed MRI of lumbar reported without contrast  from [facility] February 05 2015, multilevel degenerative disc disease, no significant foraminal or canal stenosis. Laboratory result August 30 first 2016, low normal TSH 0.7, normal CMP, with creatinine 0.8, vitamin D was 29.2, T3 total was within normal limits 85.5, normal CBC with hemoglobin of 12 point 3, normal PTH, free T4, UDS was negative  UPDATE Nov 03 2015: She has tried verapamil 40 mg twice a day for 3 weeks, complains of GI side effect, dizziness, is no longer taking it, it did not help her headache either. Insurance would not cover [place], she is now taking Imitrex nasal spray or tablet 100mg  1-2/week, oxycodone 5mg  prn (5 days a week), Flexeril 10mg  as needed for headaches,  We have reviewed MRI of the brain with and without contrast: Postoperative changes with in the sphenoid and sella turcica consistent with transsphenoidal pituitary microadenoma resection in the past. The pituitary gland has a normal signal, currently. There are several small subcortical T2/FLAIR hyperintense foci in the frontal lobes. This is a nonspecific finding but could be due to the patient's history of migraine and could also represent chronic microvascular ischemic change.   She still has headaches daily, couple times each week, her headache evolved to a more severe prolonged migraine headaches, her headache is usually  triggered by smells, muscle tightness at her neck and shoulder, hungry  UPDATE Mar 14 2016:  Patient has received Botox injection through: Rehabilitation Dr. [name] office for many years, reported mild neck extension weakness during her first injection around 2014, most recent injection was in March 2016, she received 160 units, denies significant side effect, she reported significant improvement of her migraine headaches with Botox injection  UPDATE  June 21 2016: Today she came in complains of whole body achy pain, under a lot of stress at home, reported a history of tick bite in 2002  to left thigh, with bull's eye skin lesion, now concern of possibility of Lyme disease  I have personally reviewed MRI of the brain with and without contrast in Dec 2016,  1.   Postoperative changes with in the sphenoid and sella turcica consistent with transsphenoidal pituitary microadenoma resection in the past. The pituitary gland has a normal signal, currently. 2.   There are several small subcortical T2/FLAIR hyperintense foci in the frontal lobes. This is a nonspecific finding but could be due to the patient's history of migraine and could also represent chronic microvascular ischemic change. Demyelination is less likely. The brain is otherwise normal. 3.   There are no acute findings.  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   Postoperative changes with in the sphenoid and sella turcica consistent with transsphenoidal pituitary microadenoma resection in the past. The pituitary gland has a normal signal, currently. 2.   There are several small subcortical T2/FLAIR hyperintense foci in the frontal lobes. This is a nonspecific finding but could be  due to the patient's history of migraine and could also represent chronic microvascular ischemic change. Demyelination is less likely. The brain is otherwise normal. 3.   There are no acute findings.  We went over the possible medication treatment in detail,  UPDATE Oct 23 2017: Last injection was in April 2018, she responded very well, because of the insurance reasons, has such delay of repeat Botox injection, also complains of generalized fatigue, difficulty focusing,  UPDATE Nov 21 2017: 2 weeks after his injection on October 23, 2017, also under the extreme family stress, she woke up one day severe right-sided headache, noted right-sided droopy eyelid, intermittent blurry vision, her right side blurred vision but overall has much improved, but still has noticeable difference on examination  MRI of the brain on November 15, 2017 showed mild supratentorium small vessel disease consistent with a long history of migraine,  She complains of frequent migraine headaches, but this is in the setting of extreme family tension,  UPDATE Nov 20 2018: Following Ajovy injection in April 2019, for 2 consecutive months, it did help her migraine, her migraine become less frequent, less severe, previously Botox was very helpful, she decided to stop Botox for a while because developed droopy eyelid on 1 injection, but today, she wants to go back on Botox as preventive medication,  She is now having headache days 15 days in a month, related to her menstruation cycle, with significant light noise smells sensitivity, she is  taking Fioricet, Imitrex, Flexeril as needed, she also complains of frequent neck pain, tension,  UPDATE April 23 2019: She continue have significant headaches, contributed to the anxiety, complains of snoring, previously has the diagnosis of obstructive sleep apnea  UPDATE Sept 30 2020: She continues to have frequent headaches, Botox injection was helpful  UPDATE Mar 16 2020: Her whole family  suffered COVID-19 infection at the end of March 2021, since then she noticed increased fatigue, forgetfulness, memory loss, frequent headaches during headache, nausea has improved  She is taking Fioricet, Maxalt as needed,  UPDATE Sept 22 2021: Migraine headache overall has much improved, much less frequent, less severe, but she still deals with 1-3 migraines each week, taking Fioricet as needed, Maxalt is not covered by her insurance  REVIEW OF SYSTEMS: Full 14 system review of systems performed and notable only for as above All rest review of the system were negative  ALLERGIES: Allergies  Allergen Reactions  . Latex Rash  . Verapamil Nausea Only    Headache, dizziness    HOME MEDICATIONS: Current Outpatient Prescriptions  Medication Sig Dispense Refill  . buPROPion (WELLBUTRIN SR) 150 MG 12 hr tablet Take 150 mg by mouth daily.  2  . cyclobenzaprine (FLEXERIL) 5 MG tablet Take 5 mg by mouth 3 (three) times daily as needed.  0  . lamoTRIgine (LAMICTAL) 100 MG tablet 1/2 po bid xone week, then one po bid 60 tablet 11  . Multiple Vitamin (MULTIVITAMIN) capsule Take 1 capsule by mouth daily.    [name] [name] Kitchen omega-3 acid ethyl esters (LOVAZA) 1 g capsule Take 1 capsule (1 g total) by mouth daily. 30 capsule 6  . oxyCODONE (OXY IR/ROXICODONE) 5 MG immediate release tablet Take 5 mg by mouth as needed.  0  . SUMAtriptan (IMITREX) 20 MG/ACT nasal spray Place 1 spray (20 mg total) into the nose once. May repeat in 2 hours if headache persists or recurs. 12 Inhaler 6   No current facility-administered medications for this visit.    PAST MEDICAL HISTORY: Past Medical History  Diagnosis Date  . Seasonal allergies   . GERD (gastroesophageal reflux disease)   . IBS (irritable bowel syndrome)   . Adenomatous colon polyp 2008  . Constipation   . Sciatica   . Hematochezia   . Internal hemorrhoids   . 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 Complication of anesthesia     "didn't wake up  well/remembered stuff from during OR when I had pituitary surgery"  . DVT (deep vein thrombosis) in pregnancy 09/2013    "LLE; postpartum"  . Sleep apnea     "don't currently use CPAP" (07/28/2014)  . Prolactin secreting pituitary adenoma ([place]) 1999  . H/O hiatal hernia   . Migraines     "couple times/wk" (07/28/2014)  . Arthritis     "jaw joints; neck" (07/28/2014)  . Fibromyalgia   . Depression     hx  . Anxiety 1999/2000  . Panic disorder 1999/2000    PAST SURGICAL HISTORY: Past Surgical History  Procedure Laterality Date  . Transphenoidal / transnasal hypophysectomy / resection pituitary tumor  1999    resection of benign pituitary tumor, [facility]  . Colonoscopy  01/03/2007    Dr. Silvano Rusk  . Esophagogastroduodenoscopy  09/29/2004    Dr. Kennedy Bucker  . Uterine fibroid surgery  2008  . Appendectomy  07/28/2014  . Laparoscopic appendectomy N/A 07/28/2014    Procedure: APPENDECTOMY LAPAROSCOPIC;  Surgeon: Autumn Messing III, MD;  Location: [facility] OR;  Service: General;  Laterality: N/A;    FAMILY HISTORY: Family History  Problem Relation Age of Onset  . Heart disease Mother   . Diabetes Mother   . Heart disease Father   . Prostate cancer Father   . Heart disease    . Prostate cancer    . Diabetes    . Colon cancer Maternal Grandmother   . Alzheimer's disease Father     SOCIAL HISTORY:  Social History   Social History  . Marital Status: Married    Spouse Name: N/A  . Number of Children: 3  . Years of Education: College   Occupational History  . Homemaker    Social History Main Topics  . Smoking status: Never Smoker   . Smokeless tobacco: Never Used  . Alcohol Use: 0.6 oz/week    1 Glasses of wine per week     Comment: Rarely - less than one drink every few months  . Drug Use: No  . Sexual Activity: Yes   Other Topics Concern  . Not on file   Social History Narrative   Lives at home with her husband and three children.   Right-handed.   Rare  caffeine use.     PHYSICAL EXAM   There were no vitals filed for this visit.  Not recorded      ASSESSMENT AND PLAN  IREAN KENDRICKS is a 44 y.o. female    Chronic migraine headaches  Tried and failed multiple different preventive medications in the past, Topamax, lamotrigine nortriptyline, Inderal, gabapentin, Lyrica, verapmil could not tolerate multiple medications due to side effect   Continue Maxalt dissolvable, plus Zofran,Aleve, Flexeril as needed for moderate migraine  Imitrex subcutaneous injection may be used for more severe migraine  She was receiving Botox injection every 3-4 months, which has provided moderate relief of her migraine headaches, but she developed right-sided droopy eyelid 2 weeks following her most recent injection on October 23, 2017, recover to baseline after 3 to 4 weeks,  She tried Ajovy injection in April and May 2019, reported no significant improvement, after discussed with patient, she wants to go back on Botox as migraine prevention  repeat MRI of the brain showed no acute abnormality, right side supratentorium small vessel disease consistent with long history of migraine headaches,  She also had a history of pituitary adenoma, status post transsphenoidal resection  She also has significant stress at home  Botox injection for chronic migraine prevention, injection was performed according to [place] protocol,  5 units of Botox was injected into each side, for 31 injection sites, total of 155 units  Bilateral corrugate 2 injection sites. Bilateral frontalis 4 injection sites Procerus 1 injection sites. Bilateral temporalis 8 injection sites Bilateral occipitalis 6 injection sites Bilateral cervical paraspinals 4 injection sites Bilateral upper trapezius 6 injection sites  Extra 45 unites were injected into bilateral masseters, upper cervical and, levator scapular  celexa 20mg  daily for depression I also ERx, Maxalt 10mg  per patient is not  covered by insurance, she is taking Fioricet prn  Marcial Pacas, M.D. Ph.D  [facility] [address], [place], [place] [address] Ph: [phone] Fax: [phone]  CC: Sandi Mariscal

## 2020-07-13 NOTE — Progress Notes (Signed)
**  Botox 100 units x 2 vials, NDC 1660-6004-59, Lot [id number], Exp 10/2022, office supply.//mck,rn**

## 2020-07-18 ENCOUNTER — Telehealth: Payer: Self-pay | Admitting: Neurology

## 2020-07-18 NOTE — Telephone Encounter (Signed)
Left message requesting a return call.

## 2020-07-18 NOTE — Telephone Encounter (Signed)
Per Dr. Krista Blue, the nodule should not be related to Botox.   I called the patient back again. The migraine/eye pain is better today. She recently started her menstrual cycle and now feels the headache is related to it. Also, the nodule is nearly gone but if it does not resolve completely, she will contact her PCP for evaluation.

## 2020-07-18 NOTE — Telephone Encounter (Signed)
Pt called, following up after Botox injection with Dr. Krista Blue. Having consistent headaches, nodule behind right ear, and some eye pain. Would like a call from the nurse.

## 2021-01-25 ENCOUNTER — Ambulatory Visit (INDEPENDENT_AMBULATORY_CARE_PROVIDER_SITE_OTHER): Payer: Medicare Other | Admitting: Surgery

## 2021-01-25 ENCOUNTER — Ambulatory Visit: Payer: Medicare Other

## 2021-01-25 ENCOUNTER — Telehealth: Payer: Self-pay | Admitting: *Deleted

## 2021-01-25 ENCOUNTER — Encounter: Payer: Self-pay | Admitting: Surgery

## 2021-01-25 VITALS — Ht 66.0 in | Wt 155.0 lb

## 2021-01-25 DIAGNOSIS — M549 Dorsalgia, unspecified: Secondary | ICD-10-CM

## 2021-01-25 DIAGNOSIS — M5416 Radiculopathy, lumbar region: Secondary | ICD-10-CM

## 2021-01-25 MED ORDER — METHYLPREDNISOLONE 4 MG PO TABS: ORAL_TABLET | ORAL | 0 refills | Status: DC

## 2021-01-25 MED ORDER — KETOROLAC TROMETHAMINE 30 MG/ML IJ SOLN: 30.0000 mg | Freq: Once | INTRAMUSCULAR | Status: AC

## 2021-01-25 MED ORDER — METHYLPREDNISOLONE ACETATE 40 MG/ML IJ SUSP: 40.0000 mg | Freq: Once | INTRAMUSCULAR | Status: DC

## 2021-01-25 NOTE — Progress Notes (Signed)
Office Visit Note   Patient: [name] [name]           Date of Birth: November 16, 1975           MRN: [id number] Visit Date: 01/25/2021              Requested by: No referring provider defined for this encounter. PCP: Patient, No Pcp Per (Inactive)   Assessment & Plan: Visit Diagnoses:  1. Mid back pain   2. Radiculopathy, lumbar region     Plan: At this point I recommend conservative treatment.  Today in the clinic she was given Depo-Medrol 40 mg and Toradol 30 mg IM injections.  I sent in Medrol Dosepak 6-day taper to her pharmacy.  Patient was advised to discontinue ibuprofen while taking the prednisone.  Will have her start formal PT.  Patient had a lot of questions regarding acupuncture, inversion tables and advised me that medical journals have mentioned the benefit of these methods in treating back pain.  I advised her that she is more than welcome to try those options but I am not personally recommending them.  Advised patient that physical therapist will discuss different treatment options for her which includes dry needling but she stated that this does not work for her.  I will have patient follow-up with Dr. Lorin Mercy for recheck in 3 weeks and he can decide at that time as to whether or not further imaging studies are needed with MRI.  Follow-Up Instructions: Return in about 3 weeks (around 02/15/2021) for WITH DR McGovern RECHECK LUMBAR SPINE PER Salik Grewell. .   Orders:  Orders Placed This Encounter  Procedures  . XR Thoracic Spine 2 View  . XR Lumbar Spine 2-3 Views  . Ambulatory referral to Physical Therapy   Meds ordered this encounter  Medications  . methylPREDNISolone (MEDROL) 4 MG tablet    Sig: 6-day taper to be taken as directed    Dispense:  21 tablet    Refill:  0  . methylPREDNISolone acetate (DEPO-MEDROL) injection 40 mg  . ketorolac (TORADOL) 30 MG/ML injection 30 mg      Procedures: No procedures performed   Clinical Data: No additional  findings.   Subjective: Chief Complaint  Patient presents with  . Middle Back - Pain    HPI 45 year old white female comes in today with complaints of acute on chronic back pain.  Patient states that she has seen Dr. Junius Roads and Dr. Lorin Mercy in the past for her back.  In 2007 Dr. Junius Roads ordered lumbar MRI which was performed November 19, 2005 and that study showed:  Narrative  Clinical Data: Low back pain with pain and numbness in the right leg, worsening over the last two weeks. No acute injury.  MRI LUMBAR SPINE WITHOUT CONTRAST:  Technique: Multiplanar and multiecho pulse sequences of the lumbar spine, to include the lower thoracic and upper sacral regions, were obtained according to standard protocol without IV contrast.  Comparison: None.  Findings: Five lumbar type vertebral bodies are assumed. The alignment is anatomic. There are no suspicious bone marrow signal abnormalities. Conus medullaris extends to the upper L1 level.   T12-L1: Small to moderate right paracentral disk protrusion mildly deforms the thecal sac, but results in no conus compression or encroachment on the exiting nerve roots.   L1-2, L2-3, and L3-4: Normal interspaces.  L4-5: Mild disk bulge and mild facet hypertrophy. No spinal stenosis or nerve root encroachment.   L5-S1: Mild disk bulge and mild facet hypertrophy.  No spinal stenosis or nerve root encroachment. Sacral nerve root sleeves are noted to be mildly ectatic.   IMPRESSION:  1. Small to moderate right paracentral disk protrusion at T12-L1 could be symptomatic on the basis of nerve root encroachment within the spinal canal. There is no nerve root or conus compression.   2. Mild disk bulging and facet hypertrophy at L4-5 and L5-S1. No lumbar spinal stenosis or nerve root encroachment demonstrated.            Provider: Reesa Chew   Patient did not have surgery.  She states that over the years she is continue to have flareups up to 2-3  times per year of her back.  States that she was also seen by Dr. Lorin Mercy several years ago but I do not have access to Dr. Junius Roads or Dr. Lorin Mercy office visits.  Recently had a flare after doing yard work and by lifting her young child.  This has been ongoing the last couple of weeks.  States that she is about 80% better from when she initially called the office to schedule this appointment.  Localizes pain more to the upper lumbar that extends into the right buttock area.  Some episodes of pain numbness and tingling into the right leg.  No left leg symptoms.  Patient states that she has "nodules" on her right low back around the SI joint and is asking if these could be "Schmorl's nodes" that were seen on a previous study.  Patient states that she takes ibuprofen and hydrocodone as needed.  She also uses an inversion table.    Review of Systems No current cardiac pulmonary GI GU issues  Objective: Vital Signs: Ht 5\' 6"  (1.676 m)   Wt 155 lb (70.3 kg)   BMI 25.02 kg/m   Physical Exam Eyes:     Extraocular Movements: Extraocular movements intact.  Pulmonary:     Effort: No respiratory distress.  Musculoskeletal:     Comments: Patient's right lower back spasms and tenderness around the right SI joint.  Some pain with right straight leg raise.  Neurovascular intact.  No focal motor deficits.  Negative logroll bilateral hips.  Neurological:     General: No focal deficit present.     Mental Status: She is alert and oriented to person, place, and time.     Ortho Exam  Specialty Comments:  No specialty comments available.  Imaging: No results found.   PMFS History: Patient Active Problem List   Diagnosis Date Noted  . Depression 03/16/2020  . Ptosis of right eyelid 11/22/2017  . Abnormal brain MRI 01/30/2017  . 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 Pain 06/21/2016  . Tick bite of left lower leg 06/21/2016  . Chronic fatigue 06/21/2016  . History of pituitary surgery 09/26/2015  .  ADHD (attention deficit hyperactivity disorder) 05/04/2015  . Anxiety state 03/08/2015  . Upper respiratory tract infection 11/30/2014  . Acute appendicitis 07/28/2014  . Other malaise and fatigue 03/09/2014  . Unspecified vitamin D deficiency 03/09/2014  . Postpartum care following vaginal delivery (12/18) 10/08/2013  . Normal labor and delivery 10/08/2013  . Chronic migraine 01/20/2013  . Headache, chronic migraine without aura, intractable 03/18/2012  . Myalgia and myositis 03/18/2012  . Lumbar spondylosis 03/18/2012  . PERSONAL HISTORY OF ALLERGY TO [facility]- GI SENSITIVITY 05/04/2010  . CHANGE IN BOWELS 05/02/2010  . IRRITABLE BOWEL SYNDROME 12/04/2009  . NONSPECIFIC ABN FINDNG RAD&OTH EXAM BILARY TRCT 11/30/2009  . GERD 01/31/2009  . ABDOMINAL PAIN-EPIGASTRIC 01/31/2009  .  PERSONAL HX COLONIC POLYPS 12/14/2008  . INTERNAL HEMORRHOIDS 01/03/2007  . HIATAL HERNIA 09/29/2004   Past Medical History:  Diagnosis Date  . Adenomatous colon polyp 2008  . Anxiety 1999/2000  . Arthritis    "jaw joints; neck" (07/28/2014)  . Complication of anesthesia    "didn't wake up well/remembered stuff from during OR when I had pituitary surgery"  . Constipation   . Depression    hx  . DVT (deep vein thrombosis) in pregnancy 09/2013   "LLE; postpartum"  . Fibromyalgia   . GERD (gastroesophageal reflux disease)   . H/O hiatal hernia   . Hematochezia   . IBS (irritable bowel syndrome)   . Internal hemorrhoids   . Migraines    "couple times/wk" (07/28/2014)  . 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 Panic disorder 1999/2000  . Prolactin secreting pituitary adenoma ([place]) 1999  . Sciatica   . Seasonal allergies   . Sleep apnea    "don't currently use CPAP" (07/28/2014)    Family History  Problem Relation Age of Onset  . Heart disease Mother   . Diabetes Mother   . Heart disease Father   . Prostate cancer Father   . Alzheimer's disease Father   . Colon cancer Maternal Grandmother   .  Heart disease Other   . Prostate cancer Other   . Diabetes Other   . Autism Child        2 of 3 daughters have high functioning Autism  . Breast cancer Paternal Aunt     Past Surgical History:  Procedure Laterality Date  . APPENDECTOMY  07/28/2014  . COLONOSCOPY  01/03/2007   Dr. Silvano Rusk  . ESOPHAGOGASTRODUODENOSCOPY  09/29/2004   Dr. Kennedy Bucker  . LAPAROSCOPIC APPENDECTOMY N/A 07/28/2014   Procedure: APPENDECTOMY LAPAROSCOPIC;  Surgeon: Autumn Messing III, MD;  Location: [facility];  Service: General;  Laterality: N/A;  . TRANSPHENOIDAL / TRANSNASAL HYPOPHYSECTOMY / RESECTION PITUITARY TUMOR  1999   resection of benign pituitary tumor, [facility]  . UTERINE FIBROID SURGERY  2008   Social History   Occupational History  . Occupation: Homemaker  Tobacco Use  . Smoking status: Never Smoker  . Smokeless tobacco: Never Used  Substance and Sexual Activity  . Alcohol use: Yes    Alcohol/week: 1.0 standard drink    Types: 1 Glasses of wine per week    Comment: Rarely - less than one drink every few months  . Drug use: No  . Sexual activity: Yes

## 2021-01-25 NOTE — Telephone Encounter (Signed)
Toradol 30mg  was injected into the left Glut Depo 40mg  was injected into Right glut.

## 2021-02-02 ENCOUNTER — Ambulatory Visit (HOSPITAL_BASED_OUTPATIENT_CLINIC_OR_DEPARTMENT_OTHER): Payer: Medicare Other | Attending: Surgery | Admitting: Physical Therapy

## 2021-02-02 ENCOUNTER — Other Ambulatory Visit: Payer: Self-pay

## 2021-02-02 ENCOUNTER — Telehealth: Payer: Self-pay | Admitting: Orthopaedic Surgery

## 2021-02-02 DIAGNOSIS — G8929 Other chronic pain: Secondary | ICD-10-CM | POA: Diagnosis present

## 2021-02-02 DIAGNOSIS — M546 Pain in thoracic spine: Secondary | ICD-10-CM | POA: Insufficient documentation

## 2021-02-02 DIAGNOSIS — M62830 Muscle spasm of back: Secondary | ICD-10-CM | POA: Diagnosis not present

## 2021-02-02 DIAGNOSIS — M545 Low back pain, unspecified: Secondary | ICD-10-CM | POA: Insufficient documentation

## 2021-02-02 NOTE — Therapy (Signed)
[place], [place], [address] Phone: [phone]   Fax:  [phone]  Physical Therapy Evaluation  Patient Details  Name: [name] [name] MRN: [id number] Date of Birth: Mar 11, 1976 No data recorded  Encounter Date: 02/02/2021   PT End of Session - 02/02/21 1253    Visit Number 1    Number of Visits 13    Date for PT Re-Evaluation 03/16/21    Authorization Type Medicare/Medicaid    PT Start Time 1155    PT Stop Time 1255    PT Time Calculation (min) 60 min    Activity Tolerance Patient tolerated treatment well    Behavior During Therapy [facility] for tasks assessed/performed           Past Medical History:  Diagnosis Date  . Adenomatous colon polyp 2008  . Anxiety 1999/2000  . Arthritis    "jaw joints; neck" (07/28/2014)  . Complication of anesthesia    "didn't wake up well/remembered stuff from during OR when I had pituitary surgery"  . Constipation   . Depression    hx  . DVT (deep vein thrombosis) in pregnancy 09/2013   "LLE; postpartum"  . Fibromyalgia   . GERD (gastroesophageal reflux disease)   . H/O hiatal hernia   . Hematochezia   . IBS (irritable bowel syndrome)   . Internal hemorrhoids   . Migraines    "couple times/wk" (07/28/2014)  . 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 Panic disorder 1999/2000  . 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 Sciatica   . Seasonal allergies   . Sleep apnea    "don't currently use CPAP" (07/28/2014)    Past Surgical History:  Procedure Laterality Date  . APPENDECTOMY  07/28/2014  . COLONOSCOPY  01/03/2007   Dr. Silvano Rusk  . ESOPHAGOGASTRODUODENOSCOPY  09/29/2004   Dr. Kennedy Bucker  . LAPAROSCOPIC APPENDECTOMY N/A 07/28/2014   Procedure: APPENDECTOMY LAPAROSCOPIC;  Surgeon: Autumn Messing III, MD;  Location: [place];  Service: General;  Laterality: N/A;  . TRANSPHENOIDAL / TRANSNASAL HYPOPHYSECTOMY / RESECTION PITUITARY TUMOR  1999    resection of benign pituitary tumor, [facility]  . UTERINE FIBROID SURGERY  2008    There were no vitals filed for this visit.    Subjective Assessment - 02/02/21 1159    Subjective Pt reports that her T12-L1 can flare up regularly due to issues with herniated disk. Flare up occurred ~1 month ago. Pt has been to chiropractor 5x in last few weeks which has helped some. Acupuncture has been helpful as well. Pt feels it most when upright. Pt reports that this flared up likely from yard work and lifting her 45 year old. Pt recognizes she has core weaknesses. Pt does stretches for her psoas and piriformis. Pt reports more "pressure" than spasm now especially in upright against gravity. ~5 years since last MRI. Pt states she was regularly swimming that was helping her prior to the pandemic.    Patient is accompained by: --    Limitations Sitting;Lifting;Standing;Walking    How long can you sit comfortably? Pretty immediate    How long can you stand comfortably? Pretty immediate    How long can you walk comfortably? Pretty immediate    Patient Stated Goals Stand and sit with less pain    Currently in Pain? Yes    Pain Score 8     Pain Location Back    Pain Orientation Mid;Left;Upper    Pain Descriptors /  Indicators Pressure    Pain Type Chronic pain    Pain Onset More than a month ago    Pain Frequency Intermittent    Aggravating Factors  Upright postures against gravity    Pain Relieving Factors Leaning forward over something, temporary relief with salt bath and inversion table    Effect of Pain on Daily Activities Child care, driving, standing, walking, sitting              [facility] PT Assessment - 02/02/21 0001      Special Tests    Special Tests Lumbar    Lumbar Tests FABER test;Slump Test;Prone Knee Bend Test;Straight Leg Raise      FABER test   findings Positive    Side LEft      Prone Knee Bend Test   Findings Positive    Side Left      Straight Leg Raise    Findings Negative    Comment Hamstrings highly flexible      Ambulation/Gait   Gait Pattern Step-through pattern;Antalgic;Trunk flexed;Wide base of support                      Objective measurements completed on examination: See above findings.       [facility] Adult PT Treatment/Exercise - 02/02/21 0001      Modalities   Modalities Traction      Traction   Type of Traction Lumbar    Min (lbs) 25    Max (lbs) 80    Time 10                  PT Education - 02/02/21 1249    Education Details Exam findings, POC, HEP. Discussed need for more trunk and spine stability. Discussed using SI belt or lumbar brace to reduce back spasm and back overactivity.    Person(s) Educated Patient    Methods Explanation;Verbal cues;Handout    Comprehension Verbalized understanding            PT Short Term Goals - 02/02/21 1326      PT SHORT TERM GOAL #1   Title Pt will be independent with initial HEP    Time 3    Period Weeks    Status New    Target Date 02/23/21      PT SHORT TERM GOAL #2   Title Pt will report at least 25% decrease in pain and able to self manage at home with SI belt or lumbar brace    Time 3    Period Weeks    Status New    Target Date 02/23/21             PT Long Term Goals - 02/02/21 1315      PT LONG TERM GOAL #1   Title Pt will be independent with HEP    Time 6    Period Weeks    Status New    Target Date 03/16/21      PT LONG TERM GOAL #2   Title Pt will be able to perform plank for at least 20 sec to demo improved core strength    Baseline Unable to tolerate prone    Time 6    Period Weeks    Status New    Target Date 03/16/21      PT LONG TERM GOAL #3   Title Pt will be able to perform squat with at least 25# to demo improved functional hip strength  Time 6    Period Weeks    Status New    Target Date 03/16/21      PT LONG TERM GOAL #4   Title Pt will report decrease in pain by at least 50%    Baseline 8/10     Time 6    Period Weeks    Status New    Target Date 03/16/21                  Plan - 02/02/21 1254    Clinical Impression Statement [name] [name] is a 45 y/o F presenting to OPPT due to complaint of lower thoracic, upper lumbar back pain (L>R). Pt's issue is chronic. She has had a history of multiple flare ups in the last few years. Most recent episode ~1 month ago after yard work and lifting her 45 year old. Pt with history of disk protrusion T12-L1 and L4/L5, L5/S1. Assessment limited due to pt with difficulty tolerating upright postures -- most of assessment performed in supine or prone. Pt found to be very flexible and hypermobile; however, demos overactive and hypertonic, psoas, L multifidi and quadratus lumborum. Pain improved after traction and stabilizing with belt. Discussed using SI belt or abdominal brace for added stability.    Personal Factors and Comorbidities Age;Fitness;Comorbidity 1;Time since onset of injury/illness/exacerbation    Comorbidities Disk protrusions, neck pain    Examination-Activity Limitations Locomotion Level;Sit;Stand;Lift;Dressing;Toileting;Transfers    Examination-Participation Restrictions Community Activity;Driving;Occupation;Yard Work;Laundry;Meal Prep    Stability/Clinical Decision Making Evolving/Moderate complexity    Clinical Decision Making Moderate    Rehab Potential Good    PT Frequency 1x / week    PT Duration 6 weeks    PT Treatment/Interventions ADLs/Self Care Home Management;Aquatic Therapy;Cryotherapy;Electrical Stimulation;Iontophoresis 4mg /ml Dexamethasone;Moist Heat;Traction;Ultrasound;DME Instruction;Gait training;Stair training;Functional mobility training;Therapeutic activities;Therapeutic exercise;Balance training;Neuromuscular re-education;Manual techniques;Patient/family education;Dry needling;Taping    PT Next Visit Plan Assess response to HEP. Traction, stretching and manual therapy as needed to decrease lumbar hyperactivity.  Continue core strengthening. Follow up on SI belt or lumbar brace?    PT Home Exercise Plan Access Code: [email or web address]    Consulted and Agree with Plan of Care Patient           Patient will benefit from skilled therapeutic intervention in order to improve the following deficits and impairments:  Abnormal gait,Decreased range of motion,Difficulty walking,Increased fascial restricitons,Decreased endurance,Increased muscle spasms,Decreased activity tolerance,Hypermobility,Pain,Improper body mechanics,Decreased mobility,Decreased strength,Postural dysfunction  Visit Diagnosis: Muscle spasm of back  Chronic midline low back pain without sciatica  Pain in thoracic spine     Problem List Patient Active Problem List   Diagnosis Date Noted  . Depression 03/16/2020  . Ptosis of right eyelid 11/22/2017  . Abnormal brain MRI 01/30/2017  . 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 Pain 06/21/2016  . Tick bite of left lower leg 06/21/2016  . Chronic fatigue 06/21/2016  . History of pituitary surgery 09/26/2015  . ADHD (attention deficit hyperactivity disorder) 05/04/2015  . Anxiety state 03/08/2015  . Upper respiratory tract infection 11/30/2014  . Acute appendicitis 07/28/2014  . Other malaise and fatigue 03/09/2014  . Unspecified vitamin D deficiency 03/09/2014  . Postpartum care following vaginal delivery (12/18) 10/08/2013  . Normal labor and delivery 10/08/2013  . Chronic migraine 01/20/2013  . Headache, chronic migraine without aura, intractable 03/18/2012  . Myalgia and myositis 03/18/2012  . Lumbar spondylosis 03/18/2012  . PERSONAL HISTORY OF ALLERGY TO EGGS- GI SENSITIVITY 05/04/2010  . CHANGE IN BOWELS 05/02/2010  . IRRITABLE BOWEL SYNDROME 12/04/2009  .  NONSPECIFIC ABN FINDNG RAD&OTH EXAM BILARY TRCT 11/30/2009  . GERD 01/31/2009  . ABDOMINAL PAIN-EPIGASTRIC 01/31/2009  . PERSONAL HX COLONIC POLYPS 12/14/2008  . INTERNAL HEMORRHOIDS 01/03/2007  . HIATAL HERNIA 09/29/2004     [facility] [address] PT, DPT 02/02/2021, 4:44 PM  [facility] [place], [place], [address] Phone: [phone]   Fax:  [phone]  Name: [name] [name] MRN: [id number] Date of Birth: Jul 30, 1976

## 2021-02-02 NOTE — Addendum Note (Signed)
Addended by: Colbert Ewing MARIE L on: 02/02/2021 04:45 PM   Modules accepted: Orders

## 2021-02-02 NOTE — Telephone Encounter (Signed)
Patient called asked if she can go ahead and get the MRI scheduled before she comes back in to see Dr Lorin Mercy? Patient said she was told the MRI was needed since she haven't had one since 2007.   The number to contact patient is [phone]

## 2021-02-02 NOTE — Patient Instructions (Signed)
Access Code: [email or web address] URL: [email or web address] Date: 02/02/2021 Prepared by: Estill Bamberg April Thurnell Garbe  Exercises Supine with Legs Supported at 90/90 - 1 x daily - 7 x weekly - 2 sets - 10 reps Supine March with Elevation - 1 x daily - 7 x weekly - 2 sets - 10 reps Supine Gluteus and Hamstring Sets on Swiss Ball - 1 x daily - 7 x weekly - 3 sets - 10 reps Supine Double Knee to Chest - 1 x daily - 7 x weekly - 3 sets - 20-30 sec hold

## 2021-02-02 NOTE — Telephone Encounter (Signed)
Please advise 

## 2021-02-08 NOTE — Telephone Encounter (Signed)
Lvm for pt to call back to inform 

## 2021-02-08 NOTE — Telephone Encounter (Signed)
No.  She needs to see dr Lorin Mercy per my last note.

## 2021-02-09 ENCOUNTER — Telehealth: Payer: Self-pay | Admitting: Surgery

## 2021-02-09 NOTE — Telephone Encounter (Signed)
Called and advised pt she stated understanding. F/u after PT was scheduled.

## 2021-02-09 NOTE — Telephone Encounter (Signed)
Do you want to get a MRI prior to seeing pt?

## 2021-02-09 NOTE — Telephone Encounter (Signed)
Spoke with patient and read note from [facility] concerning Dr Lorin Mercy. Patient asked if Dr Lorin Mercy assistant will call her. Patient still would like to have the MRI before she sees Dr. Lorin Mercy. The number to contact patient is [phone]

## 2021-02-09 NOTE — Telephone Encounter (Signed)
Ucall. Has to have 4 to 6 wks conservative tx before her insurance will approve MRI. Let her know. If you send in now they will just deny scan.

## 2021-02-21 ENCOUNTER — Encounter (HOSPITAL_BASED_OUTPATIENT_CLINIC_OR_DEPARTMENT_OTHER): Payer: Self-pay | Admitting: Physical Therapy

## 2021-02-21 ENCOUNTER — Ambulatory Visit (HOSPITAL_BASED_OUTPATIENT_CLINIC_OR_DEPARTMENT_OTHER): Payer: Medicare Other | Attending: Surgery | Admitting: Physical Therapy

## 2021-02-21 ENCOUNTER — Other Ambulatory Visit: Payer: Self-pay

## 2021-02-21 DIAGNOSIS — M546 Pain in thoracic spine: Secondary | ICD-10-CM | POA: Insufficient documentation

## 2021-02-21 DIAGNOSIS — M62830 Muscle spasm of back: Secondary | ICD-10-CM | POA: Diagnosis present

## 2021-02-21 DIAGNOSIS — G8929 Other chronic pain: Secondary | ICD-10-CM | POA: Insufficient documentation

## 2021-02-21 DIAGNOSIS — M545 Low back pain, unspecified: Secondary | ICD-10-CM | POA: Diagnosis present

## 2021-02-21 NOTE — Therapy (Signed)
[place], [place], [address] Phone: [phone]   Fax:  [phone]  Physical Therapy Treatment  Patient Details  Name: [name] [name] MRN: [id number] Date of Birth: 03-21-1976 Referring Provider (PT): Benjiman Core, PA-C   Encounter Date: 02/21/2021   PT End of Session - 02/21/21 1700    Visit Number 2    Number of Visits 13    Date for PT Re-Evaluation 03/16/21    Authorization Type Medicare/Medicaid    PT Start Time 1616   pt arrived late   PT Stop Time 1713    PT Time Calculation (min) 57 min    Activity Tolerance Patient tolerated treatment well    Behavior During Therapy [facility] for tasks assessed/performed           Past Medical History:  Diagnosis Date  . Adenomatous colon polyp 2008  . Anxiety 1999/2000  . Arthritis    "jaw joints; neck" (07/28/2014)  . Complication of anesthesia    "didn't wake up well/remembered stuff from during OR when I had pituitary surgery"  . Constipation   . Depression    hx  . DVT (deep vein thrombosis) in pregnancy 09/2013   "LLE; postpartum"  . Fibromyalgia   . GERD (gastroesophageal reflux disease)   . H/O hiatal hernia   . Hematochezia   . IBS (irritable bowel syndrome)   . Internal hemorrhoids   . Migraines    "couple times/wk" (07/28/2014)  . 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 Panic disorder 1999/2000  . 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 Sciatica   . Seasonal allergies   . Sleep apnea    "don't currently use CPAP" (07/28/2014)    Past Surgical History:  Procedure Laterality Date  . APPENDECTOMY  07/28/2014  . COLONOSCOPY  01/03/2007   Dr. Silvano Rusk  . ESOPHAGOGASTRODUODENOSCOPY  09/29/2004   Dr. Kennedy Bucker  . LAPAROSCOPIC APPENDECTOMY N/A 07/28/2014   Procedure: APPENDECTOMY LAPAROSCOPIC;  Surgeon: Autumn Messing III, MD;  Location: [place];  Service: General;  Laterality: N/A;  . TRANSPHENOIDAL / TRANSNASAL HYPOPHYSECTOMY  / RESECTION PITUITARY TUMOR  1999   resection of benign pituitary tumor, [facility]  . UTERINE FIBROID SURGERY  2008    There were no vitals filed for this visit.   Subjective Assessment - 02/21/21 1616    Subjective occasional sharp pain, dull and achey in lower back.    Currently in Pain? Yes    Pain Score 5     Pain Location Back    Pain Descriptors / Indicators Dull;Aching;Spasm              [facility] PT Assessment - 02/21/21 0001      Assessment   Medical Diagnosis mid back pain, lumbar radiculopathy    Referring Provider (PT) Benjiman Core, PA-C      Precautions   Precautions None      Restrictions   Weight Bearing Restrictions No      Posture/Postural Control   Posture Comments lumbar dextroscoliosis                         [facility] Adult PT Treatment/Exercise - 02/21/21 0001      Exercises   Exercises Lumbar      Lumbar Exercises: Standing   Other Standing Lumbar Exercises glut sets from st hip hinge & in sit to stand      Lumbar Exercises: Supine  Other Supine Lumbar Exercises PRI: 90/90 hip lift with Rt arm reach      Traction   Min (lbs) 25    Max (lbs) 80    Hold Time 60    Rest Time 10    Time 10      Manual Therapy   Manual Therapy Soft tissue mobilization    Soft tissue mobilization Lt thoraco-lumbar paraspinals                  PT Education - 02/21/21 1714    Education Details scoliotic curve & effects of exercises    Person(s) Educated Patient    Methods Explanation    Comprehension Verbalized understanding;Need further instruction            PT Short Term Goals - 02/02/21 1326      PT SHORT TERM GOAL #1   Title Pt will be independent with initial HEP    Time 3    Period Weeks    Status New    Target Date 02/23/21      PT SHORT TERM GOAL #2   Title Pt will report at least 25% decrease in pain and able to self manage at home with SI belt or lumbar brace    Time 3    Period Weeks    Status New     Target Date 02/23/21             PT Long Term Goals - 02/02/21 1315      PT LONG TERM GOAL #1   Title Pt will be independent with HEP    Time 6    Period Weeks    Status New    Target Date 03/16/21      PT LONG TERM GOAL #2   Title Pt will be able to perform plank for at least 20 sec to demo improved core strength    Baseline Unable to tolerate prone    Time 6    Period Weeks    Status New    Target Date 03/16/21      PT LONG TERM GOAL #3   Title Pt will be able to perform squat with at least 25# to demo improved functional hip strength    Time 6    Period Weeks    Status New    Target Date 03/16/21      PT LONG TERM GOAL #4   Title Pt will report decrease in pain by at least 50%    Baseline 8/10    Time 6    Period Weeks    Status New    Target Date 03/16/21                 Plan - 02/21/21 1701    Clinical Impression Statement concordant pain in trigger points in paraspinals reduced with manual treatment. Lt rib cage flare addressed with PRI exercise and pt reported feeling more neutral. cues to use gluts in standing positions rather than lumbar ext. felt that traction was very helpful last time and was repeated today.    PT Treatment/Interventions ADLs/Self Care Home Management;Aquatic Therapy;Cryotherapy;Electrical Stimulation;Iontophoresis 4mg /ml Dexamethasone;Moist Heat;Traction;Ultrasound;DME Instruction;Gait training;Stair training;Functional mobility training;Therapeutic activities;Therapeutic exercise;Balance training;Neuromuscular re-education;Manual techniques;Patient/family education;Dry needling;Taping    PT Next Visit Plan review PRi. core/glut activation and stability    PT Home Exercise Plan Access Code: [email or web address]    Consulted and Agree with Plan of Care Patient           Patient  will benefit from skilled therapeutic intervention in order to improve the following deficits and impairments:  Abnormal gait,Decreased range of motion,Difficulty  walking,Increased fascial restricitons,Decreased endurance,Increased muscle spasms,Decreased activity tolerance,Hypermobility,Pain,Improper body mechanics,Decreased mobility,Decreased strength,Postural dysfunction  Visit Diagnosis: Chronic midline low back pain without sciatica  Muscle spasm of back  Pain in thoracic spine     Problem List Patient Active Problem List   Diagnosis Date Noted  . Depression 03/16/2020  . Ptosis of right eyelid 11/22/2017  . Abnormal brain MRI 01/30/2017  . 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 Pain 06/21/2016  . Tick bite of left lower leg 06/21/2016  . Chronic fatigue 06/21/2016  . History of pituitary surgery 09/26/2015  . ADHD (attention deficit hyperactivity disorder) 05/04/2015  . Anxiety state 03/08/2015  . Upper respiratory tract infection 11/30/2014  . Acute appendicitis 07/28/2014  . Other malaise and fatigue 03/09/2014  . Unspecified vitamin D deficiency 03/09/2014  . Postpartum care following vaginal delivery (12/18) 10/08/2013  . Normal labor and delivery 10/08/2013  . Chronic migraine 01/20/2013  . Headache, chronic migraine without aura, intractable 03/18/2012  . Myalgia and myositis 03/18/2012  . Lumbar spondylosis 03/18/2012  . PERSONAL HISTORY OF ALLERGY TO EGGS- GI SENSITIVITY 05/04/2010  . CHANGE IN BOWELS 05/02/2010  . IRRITABLE BOWEL SYNDROME 12/04/2009  . NONSPECIFIC ABN FINDNG RAD&OTH EXAM BILARY TRCT 11/30/2009  . GERD 01/31/2009  . ABDOMINAL PAIN-EPIGASTRIC 01/31/2009  . PERSONAL HX COLONIC POLYPS 12/14/2008  . INTERNAL HEMORRHOIDS 01/03/2007  . HIATAL HERNIA 09/29/2004    Jerian Morais C. Amatullah Christy PT, DPT 02/21/21 5:15 PM   [facility] [place], [place], [address] Phone: [phone]   Fax:  [phone]  Name: [name] [name] MRN: [id number] Date of Birth: 1976-02-02

## 2021-02-22 ENCOUNTER — Ambulatory Visit (HOSPITAL_BASED_OUTPATIENT_CLINIC_OR_DEPARTMENT_OTHER): Payer: Medicare Other | Admitting: Physical Therapy

## 2021-02-22 ENCOUNTER — Encounter (HOSPITAL_BASED_OUTPATIENT_CLINIC_OR_DEPARTMENT_OTHER): Payer: Self-pay | Admitting: Physical Therapy

## 2021-02-22 DIAGNOSIS — M545 Low back pain, unspecified: Secondary | ICD-10-CM | POA: Diagnosis not present

## 2021-02-22 DIAGNOSIS — G8929 Other chronic pain: Secondary | ICD-10-CM

## 2021-02-22 DIAGNOSIS — M62830 Muscle spasm of back: Secondary | ICD-10-CM

## 2021-02-22 NOTE — Therapy (Signed)
[facility] [place], [place], [address] Phone: [phone]   Fax:  [phone]  Physical Therapy Treatment  Patient Details  Name: [name] [name] MRN: [id number] Date of Birth: 01/04/1976 Referring Provider (PT): Benjiman Core, PA-C   Encounter Date: 02/22/2021   PT End of Session - 02/22/21 1530    Visit Number 3    Number of Visits 13    Date for PT Re-Evaluation 03/16/21    PT Start Time 6269    PT Stop Time 1611    PT Time Calculation (min) 47 min           Past Medical History:  Diagnosis Date  . Adenomatous colon polyp 2008  . Anxiety 1999/2000  . Arthritis    "jaw joints; neck" (07/28/2014)  . Complication of anesthesia    "didn't wake up well/remembered stuff from during OR when I had pituitary surgery"  . Constipation   . Depression    hx  . DVT (deep vein thrombosis) in pregnancy 09/2013   "LLE; postpartum"  . Fibromyalgia   . GERD (gastroesophageal reflux disease)   . H/O hiatal hernia   . Hematochezia   . IBS (irritable bowel syndrome)   . Internal hemorrhoids   . Migraines    "couple times/wk" (07/28/2014)  . 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 Panic disorder 1999/2000  . 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 Sciatica   . Seasonal allergies   . Sleep apnea    "don't currently use CPAP" (07/28/2014)    Past Surgical History:  Procedure Laterality Date  . APPENDECTOMY  07/28/2014  . COLONOSCOPY  01/03/2007   Dr. Silvano Rusk  . ESOPHAGOGASTRODUODENOSCOPY  09/29/2004   Dr. Kennedy Bucker  . LAPAROSCOPIC APPENDECTOMY N/A 07/28/2014   Procedure: APPENDECTOMY LAPAROSCOPIC;  Surgeon: Autumn Messing III, MD;  Location: [facility];  Service: General;  Laterality: N/A;  . TRANSPHENOIDAL / TRANSNASAL HYPOPHYSECTOMY / RESECTION PITUITARY TUMOR  1999   resection of benign pituitary tumor, [facility]  . UTERINE FIBROID SURGERY  2008    There were no vitals filed for this  visit.   Subjective Assessment - 02/22/21 1524    Subjective not unusally sore or anything after yesterrday                             [facility] Adult PT Treatment/Exercise - 02/22/21 0001      Lumbar Exercises: Sidelying   Other Sidelying Lumbar Exercises PRI: Lt sidelying knee toward knee      Traction   Min (lbs) 25    Max (lbs) 80    Hold Time 60    Rest Time 10    Time 10      Manual Therapy   Soft tissue mobilization Rt lumbar paraspinals, Rt upper rib cage mobility & STM                    PT Short Term Goals - 02/02/21 1326      PT SHORT TERM GOAL #1   Title Pt will be independent with initial HEP    Time 3    Period Weeks    Status New    Target Date 02/23/21      PT SHORT TERM GOAL #2   Title Pt will report at least 25% decrease in pain and able to self manage at home with SI belt or lumbar  brace    Time 3    Period Weeks    Status New    Target Date 02/23/21             PT Long Term Goals - 02/02/21 1315      PT LONG TERM GOAL #1   Title Pt will be independent with HEP    Time 6    Period Weeks    Status New    Target Date 03/16/21      PT LONG TERM GOAL #2   Title Pt will be able to perform plank for at least 20 sec to demo improved core strength    Baseline Unable to tolerate prone    Time 6    Period Weeks    Status New    Target Date 03/16/21      PT LONG TERM GOAL #3   Title Pt will be able to perform squat with at least 25# to demo improved functional hip strength    Time 6    Period Weeks    Status New    Target Date 03/16/21      PT LONG TERM GOAL #4   Title Pt will report decrease in pain by at least 50%    Baseline 8/10    Time 6    Period Weeks    Status New    Target Date 03/16/21                 Plan - 02/22/21 1910    Clinical Impression Statement added sidelying PRI exercise- unable to hold LLE adduction but able to lift with exhale without neck pain.    PT  Treatment/Interventions ADLs/Self Care Home Management;Aquatic Therapy;Cryotherapy;Electrical Stimulation;Iontophoresis 4mg /ml Dexamethasone;Moist Heat;Traction;Ultrasound;DME Instruction;Gait training;Stair training;Functional mobility training;Therapeutic activities;Therapeutic exercise;Balance training;Neuromuscular re-education;Manual techniques;Patient/family education;Dry needling;Taping    PT Next Visit Plan review PRi. core/glut activation and stability    PT Home Exercise Plan Access Code: [email or web address]    Consulted and Agree with Plan of Care Patient           Patient will benefit from skilled therapeutic intervention in order to improve the following deficits and impairments:  Abnormal gait,Decreased range of motion,Difficulty walking,Increased fascial restricitons,Decreased endurance,Increased muscle spasms,Decreased activity tolerance,Hypermobility,Pain,Improper body mechanics,Decreased mobility,Decreased strength,Postural dysfunction  Visit Diagnosis: Chronic midline low back pain without sciatica  Muscle spasm of back     Problem List Patient Active Problem List   Diagnosis Date Noted  . Depression 03/16/2020  . Ptosis of right eyelid 11/22/2017  . Abnormal brain MRI 01/30/2017  . 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 Pain 06/21/2016  . Tick bite of left lower leg 06/21/2016  . Chronic fatigue 06/21/2016  . History of pituitary surgery 09/26/2015  . ADHD (attention deficit hyperactivity disorder) 05/04/2015  . Anxiety state 03/08/2015  . Upper respiratory tract infection 11/30/2014  . Acute appendicitis 07/28/2014  . Other malaise and fatigue 03/09/2014  . Unspecified vitamin D deficiency 03/09/2014  . Postpartum care following vaginal delivery (12/18) 10/08/2013  . Normal labor and delivery 10/08/2013  . Chronic migraine 01/20/2013  . Headache, chronic migraine without aura, intractable 03/18/2012  . Myalgia and myositis 03/18/2012  . Lumbar spondylosis 03/18/2012   . PERSONAL HISTORY OF ALLERGY TO EGGS- GI SENSITIVITY 05/04/2010  . CHANGE IN BOWELS 05/02/2010  . IRRITABLE BOWEL SYNDROME 12/04/2009  . NONSPECIFIC ABN FINDNG RAD&OTH EXAM BILARY TRCT 11/30/2009  . GERD 01/31/2009  . ABDOMINAL PAIN-EPIGASTRIC 01/31/2009  . PERSONAL HX COLONIC POLYPS 12/14/2008  .  INTERNAL HEMORRHOIDS 01/03/2007  . HIATAL HERNIA 09/29/2004   Melvia Matousek C. Cinzia Devos PT, DPT 02/22/21 7:22 PM   [facility] [place], [place], [address] Phone: [phone]   Fax:  [phone]  Name: LINDSY CERULLO MRN: [id number] Date of Birth: 1975/11/15

## 2021-02-27 ENCOUNTER — Ambulatory Visit (HOSPITAL_BASED_OUTPATIENT_CLINIC_OR_DEPARTMENT_OTHER): Payer: Medicare Other | Admitting: Physical Therapy

## 2021-02-27 ENCOUNTER — Other Ambulatory Visit: Payer: Self-pay

## 2021-02-27 ENCOUNTER — Encounter (HOSPITAL_BASED_OUTPATIENT_CLINIC_OR_DEPARTMENT_OTHER): Payer: Self-pay | Admitting: Physical Therapy

## 2021-02-27 DIAGNOSIS — M62830 Muscle spasm of back: Secondary | ICD-10-CM

## 2021-02-27 DIAGNOSIS — M545 Low back pain, unspecified: Secondary | ICD-10-CM | POA: Diagnosis not present

## 2021-02-27 DIAGNOSIS — M546 Pain in thoracic spine: Secondary | ICD-10-CM

## 2021-02-27 NOTE — Therapy (Signed)
[place], [place], [address] Phone: [phone]   Fax:  [phone]  Physical Therapy Treatment  Patient Details  Name: [name] [name] MRN: [id number] Date of Birth: 1976-08-18 Referring Provider (PT): Benjiman Core, PA-C   Encounter Date: 02/27/2021   PT End of Session - 02/27/21 1351    Visit Number 4    Number of Visits 13    Date for PT Re-Evaluation 03/16/21    Authorization Type Medicare/Medicaid    PT Start Time 1350    PT Stop Time 1428    PT Time Calculation (min) 38 min    Activity Tolerance Patient tolerated treatment well    Behavior During Therapy [facility] for tasks assessed/performed           Past Medical History:  Diagnosis Date  . Adenomatous colon polyp 2008  . Anxiety 1999/2000  . Arthritis    "jaw joints; neck" (07/28/2014)  . Complication of anesthesia    "didn't wake up well/remembered stuff from during OR when I had pituitary surgery"  . Constipation   . Depression    hx  . DVT (deep vein thrombosis) in pregnancy 09/2013   "LLE; postpartum"  . Fibromyalgia   . GERD (gastroesophageal reflux disease)   . H/O hiatal hernia   . Hematochezia   . IBS (irritable bowel syndrome)   . Internal hemorrhoids   . Migraines    "couple times/wk" (07/28/2014)  . 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 Panic disorder 1999/2000  . 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 Sciatica   . Seasonal allergies   . Sleep apnea    "don't currently use CPAP" (07/28/2014)    Past Surgical History:  Procedure Laterality Date  . APPENDECTOMY  07/28/2014  . COLONOSCOPY  01/03/2007   Dr. Silvano Rusk  . ESOPHAGOGASTRODUODENOSCOPY  09/29/2004   Dr. Kennedy Bucker  . LAPAROSCOPIC APPENDECTOMY N/A 07/28/2014   Procedure: APPENDECTOMY LAPAROSCOPIC;  Surgeon: Autumn Messing III, MD;  Location: [facility];  Service: General;  Laterality: N/A;  . TRANSPHENOIDAL / TRANSNASAL HYPOPHYSECTOMY / RESECTION  PITUITARY TUMOR  1999   [facility] benign pituitary [facility], [facility]  . UTERINE FIBROID SURGERY  2008    There were no vitals filed for this visit.   Subjective Assessment - 02/27/21 1351    Subjective I feel like I am walking a little more normally. Feel like I am in a flare up of Lt hip and Rt low back. a little bit of a HA today.    Currently in Pain? Yes    Pain Score 5     Pain Location Back    Pain Orientation Right    Pain Descriptors / Indicators Aching                             [facility] Adult PT Treatment/Exercise - 02/27/21 0001      Lumbar Exercises: Standing   Other Standing Lumbar Exercises PRI: lat hang      Lumbar Exercises: Supine   Other Supine Lumbar Exercises PRI: 90/90 hip lift with Rt arm reach      Lumbar Exercises: Sidelying   Other Sidelying Lumbar Exercises PRI: Lt sidelying knee toward knee      Manual Therapy   Soft tissue mobilization Lt TFL                    PT  Short Term Goals - 02/27/21 1436      PT SHORT TERM GOAL #1   Title Pt will be independent with initial HEP    Status Achieved      PT SHORT TERM GOAL #2   Title Pt will report at least 25% decrease in pain and able to self manage at home with SI belt or lumbar brace    Baseline does not particularly want to wear tthe belt but does feel the benefit.    Status Achieved             PT Long Term Goals - 02/02/21 1315      PT LONG TERM GOAL #1   Title Pt will be independent with HEP    Time 6    Period Weeks    Status New    Target Date 03/16/21      PT LONG TERM GOAL #2   Title Pt will be able to perform plank for at least 20 sec to demo improved core strength    Baseline Unable to tolerate prone    Time 6    Period Weeks    Status New    Target Date 03/16/21      PT LONG TERM GOAL #3   Title Pt will be able to perform squat with at least 25# to demo improved functional hip strength    Time 6    Period Weeks    Status New     Target Date 03/16/21      PT LONG TERM GOAL #4   Title Pt will report decrease in pain by at least 50%    Baseline 8/10    Time 6    Period Weeks    Status New    Target Date 03/16/21                 Plan - 02/27/21 1432    Clinical Impression Statement video to show pt curve and straightening of spine with sidelying PRI exercise. decr tension in Lt TFL to reduce pull through ITB.    PT Treatment/Interventions ADLs/Self Care Home Management;Aquatic Therapy;Cryotherapy;Electrical Stimulation;Iontophoresis 4mg /ml Dexamethasone;Moist Heat;Traction;Ultrasound;DME Instruction;Gait training;Stair training;Functional mobility training;Therapeutic activities;Therapeutic exercise;Balance training;Neuromuscular re-education;Manual techniques;Patient/family education;Dry needling;Taping    PT Next Visit Plan gym machines, DN around Rt SIJ if still having pain    PT Home Exercise Plan Access Code: [email or web address]    Consulted and Agree with Plan of Care Patient           Patient will benefit from skilled therapeutic intervention in order to improve the following deficits and impairments:  Abnormal gait,Decreased range of motion,Difficulty walking,Increased fascial restricitons,Decreased endurance,Increased muscle spasms,Decreased activity tolerance,Hypermobility,Pain,Improper body mechanics,Decreased mobility,Decreased strength,Postural dysfunction  Visit Diagnosis: Chronic midline low back pain without sciatica  Muscle spasm of back  Pain in thoracic spine     Problem List Patient Active Problem List   Diagnosis Date Noted  . Depression 03/16/2020  . Ptosis of right eyelid 11/22/2017  . Abnormal brain MRI 01/30/2017  . 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 Pain 06/21/2016  . Tick bite of left lower leg 06/21/2016  . Chronic fatigue 06/21/2016  . History of pituitary surgery 09/26/2015  . ADHD (attention deficit hyperactivity disorder) 05/04/2015  . Anxiety state 03/08/2015  .  Upper respiratory tract infection 11/30/2014  . Acute appendicitis 07/28/2014  . Other malaise and fatigue 03/09/2014  . Unspecified vitamin D deficiency 03/09/2014  . Postpartum care following vaginal delivery (12/18) 10/08/2013  . Normal  labor and delivery 10/08/2013  . Chronic migraine 01/20/2013  . Headache, chronic migraine without aura, intractable 03/18/2012  . Myalgia and myositis 03/18/2012  . Lumbar spondylosis 03/18/2012  . PERSONAL HISTORY OF ALLERGY TO EGGS- GI SENSITIVITY 05/04/2010  . CHANGE IN BOWELS 05/02/2010  . IRRITABLE BOWEL SYNDROME 12/04/2009  . NONSPECIFIC ABN FINDNG RAD&OTH EXAM BILARY TRCT 11/30/2009  . GERD 01/31/2009  . ABDOMINAL PAIN-EPIGASTRIC 01/31/2009  . PERSONAL HX COLONIC POLYPS 12/14/2008  . INTERNAL HEMORRHOIDS 01/03/2007  . HIATAL HERNIA 09/29/2004    [name] [name] C. Cassiopeia Florentino PT, DPT 02/27/21 2:37 PM   [facility] [place], [place], [address] Phone: [phone]   Fax:  [phone]  Name: BRYTNEE BECHLER MRN: [id number] Date of Birth: 1975-12-28

## 2021-03-08 ENCOUNTER — Telehealth: Payer: Self-pay | Admitting: Orthopaedic Surgery

## 2021-03-08 ENCOUNTER — Ambulatory Visit (INDEPENDENT_AMBULATORY_CARE_PROVIDER_SITE_OTHER): Payer: Medicare Other | Admitting: Orthopaedic Surgery

## 2021-03-08 ENCOUNTER — Encounter: Payer: Self-pay | Admitting: Orthopaedic Surgery

## 2021-03-08 VITALS — BP 126/69 | HR 109 | Ht 66.0 in | Wt 160.0 lb

## 2021-03-08 DIAGNOSIS — M47816 Spondylosis without myelopathy or radiculopathy, lumbar region: Secondary | ICD-10-CM | POA: Diagnosis not present

## 2021-03-08 DIAGNOSIS — M5126 Other intervertebral disc displacement, lumbar region: Secondary | ICD-10-CM | POA: Diagnosis not present

## 2021-03-08 DIAGNOSIS — M545 Low back pain, unspecified: Secondary | ICD-10-CM | POA: Diagnosis not present

## 2021-03-08 DIAGNOSIS — G8929 Other chronic pain: Secondary | ICD-10-CM

## 2021-03-08 MED ORDER — PREDNISONE 10 MG (21) PO TBPK: ORAL_TABLET | ORAL | 0 refills | Status: DC

## 2021-03-08 MED ORDER — DIAZEPAM 5 MG PO TABS: ORAL_TABLET | ORAL | 0 refills | Status: DC

## 2021-03-08 NOTE — Progress Notes (Signed)
Office Visit Note   Patient: [name] [name]           Date of Birth: [birth year]           MRN: [id number] Visit Date: 03/08/2021              Requested by: No referring provider defined for this encounter. PCP: Patient, No Pcp Per (Inactive)   Assessment & Plan: Visit Diagnoses:  1. Chronic bilateral low back pain, unspecified whether sciatica present   2. Lumbar spondylosis   3. Protrusion of lumbar intervertebral disc     Plan: Therapy results were reviewed.  She has had considerable problems now with greater than 6 weeks treatment including acupuncture, chiropractic, formal physical therapy, anti-inflammatories.  We will proceed with MRI scan lumbar spine and to evaluate her T12-L1 disc protrusion and L5-S1 disc protrusion previously noted in 2007.  She states she is ready to proceed with any type of treatment that will help with pain since its present daily and debilitating.  Also follow-up after scan.  Follow-Up Instructions: No follow-ups on file.   Orders:  Orders Placed This Encounter  Procedures  . MR Lumbar Spine w/o contrast   Meds ordered this encounter  Medications  . predniSONE (STERAPRED UNI-PAK 21 TAB) 10 MG (21) TBPK tablet    Sig: Take 2 tablets daily for 10 days    Dispense:  20 tablet    Refill:  0  . diazepam (VALIUM) 5 MG tablet    Sig: Take as directed prior to procedure. Must have driver.    Dispense:  3 tablet    Refill:  0      Procedures: No procedures performed   Clinical Data: No additional findings.   Subjective: Chief Complaint  Patient presents with  . Lower Back - Pain, Follow-up    HPI 45 year old female returns for ongoing problems with mid back pain.  Patient has been through physical therapy with some improvement.  She is also been through acupuncture, chiropractic, ibuprofen, hydrocodone.  She continues to have ongoing symptoms.  Past history showing T12-L1 moderate paracentral disc protrusion in 2007 and also L5-S1 disc  bulge with facet hypertrophy.  Patient states her pain is daily ongoing bothers her take care of her children.  Review of Systems no bowel or bladder symptoms.  Negative for night pain.  No fever or chills.  All other systems noncontributory to HPI.   Objective: Vital Signs: BP 126/69   Pulse (!) 109   Ht 5\' 6"  (1.676 m)   Wt 160 lb (72.6 kg)   BMI 25.82 kg/m   Physical Exam Constitutional:      Appearance: She is well-developed.  HENT:     Head: Normocephalic.     Right Ear: External ear normal.     Left Ear: External ear normal.  Eyes:     Pupils: Pupils are equal, round, and reactive to light.  Neck:     Thyroid: No thyromegaly.     Trachea: No tracheal deviation.  Cardiovascular:     Rate and Rhythm: Normal rate.  Pulmonary:     Effort: Pulmonary effort is normal.  Abdominal:     Palpations: Abdomen is soft.  Skin:    General: Skin is warm and dry.  Neurological:     Mental Status: She is alert and oriented to person, place, and time.  Psychiatric:        Behavior: Behavior normal.     Ortho Exam patient has problems  with forward flexion with thoracolumbar pain.  Negative logroll of the hips knee and ankle jerk are intact.  She has some sciatic notch tenderness both right and left.  Hip flexion quads are strong anterior tib EHL is strong.  Specialty Comments:  No specialty comments available.  Imaging: No results found.   PMFS History: Patient Active Problem List   Diagnosis Date Noted  . Protrusion of lumbar intervertebral disc 03/09/2021  . Depression 03/16/2020  . Ptosis of right eyelid 11/22/2017  . Abnormal brain MRI 01/30/2017  . 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 Pain 06/21/2016  . Tick bite of left lower leg 06/21/2016  . Chronic fatigue 06/21/2016  . History of pituitary surgery 09/26/2015  . ADHD (attention deficit hyperactivity disorder) 05/04/2015  . Anxiety state 03/08/2015  . Upper respiratory tract infection 11/30/2014  . Acute  appendicitis 07/28/2014  . Other malaise and fatigue 03/09/2014  . Unspecified vitamin D deficiency 03/09/2014  . Postpartum care following vaginal delivery (12/18) 10/08/2013  . Normal labor and delivery 10/08/2013  . Chronic migraine 01/20/2013  . Headache, chronic migraine without aura, intractable 03/18/2012  . Myalgia and myositis 03/18/2012  . Lumbar spondylosis 03/18/2012  . PERSONAL HISTORY OF ALLERGY TO EGGS- GI SENSITIVITY 05/04/2010  . CHANGE IN BOWELS 05/02/2010  . IRRITABLE BOWEL SYNDROME 12/04/2009  . NONSPECIFIC ABN FINDNG RAD&OTH EXAM BILARY TRCT 11/30/2009  . GERD 01/31/2009  . ABDOMINAL PAIN-EPIGASTRIC 01/31/2009  . PERSONAL HX COLONIC POLYPS 12/14/2008  . INTERNAL HEMORRHOIDS 01/03/2007  . HIATAL HERNIA 09/29/2004   Past Medical History:  Diagnosis Date  . Adenomatous colon polyp 2008  . Anxiety 1999/2000  . Arthritis    "jaw joints; neck" (07/28/2014)  . Complication of anesthesia    "didn't wake up well/remembered stuff from during OR when I had pituitary surgery"  . Constipation   . Depression    hx  . DVT (deep vein thrombosis) in pregnancy 09/2013   "LLE; postpartum"  . Fibromyalgia   . GERD (gastroesophageal reflux disease)   . H/O hiatal hernia   . Hematochezia   . IBS (irritable bowel syndrome)   . Internal hemorrhoids   . Migraines    "couple times/wk" (07/28/2014)  . 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 Panic disorder 1999/2000  . 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 Sciatica   . Seasonal allergies   . Sleep apnea    "don't currently use CPAP" (07/28/2014)    Family History  Problem Relation Age of Onset  . Heart disease Mother   . Diabetes Mother   . Heart disease Father   . Prostate cancer Father   . Alzheimer's disease Father   . Colon cancer Maternal Grandmother   . Heart disease Other   . Prostate cancer Other   . Diabetes Other   . Autism Child        2 of 3 daughters have high functioning Autism  . Breast  cancer Paternal Aunt     Past Surgical History:  Procedure Laterality Date  . APPENDECTOMY  07/28/2014  . COLONOSCOPY  01/03/2007   Dr. Silvano Rusk  . ESOPHAGOGASTRODUODENOSCOPY  09/29/2004   Dr. Kennedy Bucker  . LAPAROSCOPIC APPENDECTOMY N/A 07/28/2014   Procedure: APPENDECTOMY LAPAROSCOPIC;  Surgeon: Autumn Messing III, MD;  Location: [facility];  Service: General;  Laterality: N/A;  . TRANSPHENOIDAL / TRANSNASAL HYPOPHYSECTOMY / RESECTION PITUITARY TUMOR  1999   resection of benign pituitary tumor, [facility]  . UTERINE FIBROID SURGERY  2008   Social History   Occupational History  . Occupation: Homemaker  Tobacco Use  . Smoking status: Never Smoker  . Smokeless tobacco: Never Used  Substance and Sexual Activity  . Alcohol use: Yes    Alcohol/week: 1.0 standard drink    Types: 1 Glasses of wine per week    Comment: Rarely - less than one drink every few months  . Drug use: No  . Sexual activity: Yes

## 2021-03-08 NOTE — Telephone Encounter (Signed)
Pt called and she states that she has a negative COVID test but she is having a scratchy throat and coughing. Her appt is at 10:30 today just wanted you  guys to be aware. CB [phone]

## 2021-03-08 NOTE — Telephone Encounter (Signed)
FYI

## 2021-03-09 DIAGNOSIS — M5126 Other intervertebral disc displacement, lumbar region: Secondary | ICD-10-CM | POA: Insufficient documentation

## 2021-03-13 ENCOUNTER — Other Ambulatory Visit: Payer: Self-pay

## 2021-03-13 ENCOUNTER — Ambulatory Visit (HOSPITAL_BASED_OUTPATIENT_CLINIC_OR_DEPARTMENT_OTHER): Payer: Medicare Other | Admitting: Physical Therapy

## 2021-03-13 DIAGNOSIS — M546 Pain in thoracic spine: Secondary | ICD-10-CM

## 2021-03-13 DIAGNOSIS — M545 Low back pain, unspecified: Secondary | ICD-10-CM

## 2021-03-13 DIAGNOSIS — M62830 Muscle spasm of back: Secondary | ICD-10-CM

## 2021-03-13 NOTE — Therapy (Addendum)
[facility] [address] [place], [place], [address] Phone: [phone]   Fax:  [phone]  Physical Therapy Treatment/Discharge  Patient Details  Name: RUTHELL FEIGENBAUM MRN: [id number] Date of Birth: 05-16-1976 Referring Provider (PT): Benjiman Core, PA-C   Encounter Date: 03/13/2021   PT End of Session - 03/13/21 1601     Visit Number 5    Number of Visits 13    Date for PT Re-Evaluation 03/16/21    Authorization Type Medicare/Medicaid    PT Start Time 1525   arrived late, no charge for restroom time   PT Stop Time 1601    PT Time Calculation (min) 36 min    Activity Tolerance Patient tolerated treatment well    Behavior During Therapy [facility] for tasks assessed/performed             Past Medical History:  Diagnosis Date   Adenomatous colon polyp 2008   Anxiety 1999/2000   Arthritis    "jaw joints; neck" (34/10/9377)   Complication of anesthesia    "didn't wake up well/remembered stuff from during OR when I had pituitary surgery"   Constipation    Depression    hx   DVT (deep vein thrombosis) in pregnancy 09/2013   "LLE; postpartum"   Fibromyalgia    GERD (gastroesophageal reflux disease)    H/O hiatal hernia    Hematochezia    IBS (irritable bowel syndrome)    Internal hemorrhoids    Migraines    "couple times/wk" (07/28/2014)   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 Silvano Rusk   ESOPHAGOGASTRODUODENOSCOPY  09/29/2004   Dr. Kennedy Bucker   LAPAROSCOPIC APPENDECTOMY N/A 07/28/2014   Procedure: APPENDECTOMY LAPAROSCOPIC;  Surgeon: Autumn Messing III, MD;  Location: [facility];  Service: General;  Laterality: N/A;   TRANSPHENOIDAL /  TRANSNASAL HYPOPHYSECTOMY / RESECTION PITUITARY TUMOR  1999   resection of benign pituitary tumor, [place]  2008    There were no vitals filed for this visit.                      [facility] PT Treatment/Exercise - 03/13/21 0001       Neuro Re-ed    Neuro Re-ed Details  stacked posture at wall      Exercises   Exercises Other Exercises    Other Exercises  moved along gym machines for form      Manual Therapy   Manual therapy comments skilled palpation and monitoring during TPDN    Soft tissue mobilization Rt piriformis & glut med/min              Trigger Point Dry Needling - 03/13/21 0001     Consent Given? Yes    Education Handout Provided --   verbal edu   Muscles Treated Back/Hip Gluteus medius;Piriformis    Gluteus Medius Response Twitch response elicited;Palpable increased muscle length   Rt   Piriformis Response Twitch response elicited;Palpable increased muscle length   Rt                   PT Short Term Goals -  02/27/21 1436       PT SHORT TERM GOAL #1   Title Pt will be independent with initial HEP    Status Achieved      PT SHORT TERM GOAL #2   Title Pt will report at least 25% decrease in pain and able to self manage at home with SI belt or lumbar brace    Baseline does not particularly want to wear tthe belt but does feel the benefit.    Status Achieved               PT Long Term Goals - 02/02/21 1315       PT LONG TERM GOAL #1   Title Pt will be independent with HEP    Time 6    Period Weeks    Status New    Target Date 03/16/21      PT LONG TERM GOAL #2   Title Pt will be able to perform plank for at least 20 sec to demo improved core strength    Baseline Unable to tolerate prone    Time 6    Period Weeks    Status New    Target Date 03/16/21      PT LONG TERM GOAL #3   Title Pt will be able to perform squat with at least 25# to demo improved functional hip  strength    Time 6    Period Weeks    Status New    Target Date 03/16/21      PT LONG TERM GOAL #4   Title Pt will report decrease in pain by at least 50%    Baseline 8/10    Time 6    Period Weeks    Status New    Target Date 03/16/21                   Plan - 03/13/21 1648     Clinical Impression Statement DN to Rt hip decr concordant pain. trigger points in glut med and piriformis were creating attachment site irritation at sacrum. reviewed proper form with gym machines today. tends to stand with pelvis anteriorly placed which incr pressure through lumbar spine and creates forward head posture. used wall as tactile cue for stacked posture.    PT Treatment/Interventions ADLs/Self Care Home Management;Aquatic Therapy;Cryotherapy;Electrical Stimulation;Iontophoresis 22m/ml Dexamethasone;Moist Heat;Traction;Ultrasound;DME Instruction;Gait training;Stair training;Functional mobility training;Therapeutic activities;Therapeutic exercise;Balance training;Neuromuscular re-education;Manual techniques;Patient/family education;Dry needling;Taping    PT Next Visit Plan review uprigh posture. core stability    PT Home Exercise Plan Access Code: [email or web address]   Consulted and Agree with Plan of Care Patient             Patient will benefit from skilled therapeutic intervention in order to improve the following deficits and impairments:  Abnormal gait,Decreased range of motion,Difficulty walking,Increased fascial restricitons,Decreased endurance,Increased muscle spasms,Decreased activity tolerance,Hypermobility,Pain,Improper body mechanics,Decreased mobility,Decreased strength,Postural dysfunction  Visit Diagnosis: Chronic midline low back pain without sciatica  Muscle spasm of back  Pain in thoracic spine     Problem List Patient Active Problem List   Diagnosis Date Noted   Protrusion of lumbar intervertebral disc 03/09/2021   Depression 03/16/2020   Ptosis of right eyelid  11/22/2017   Abnormal brain MRI 01/30/2017   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   Pain 06/21/2016   Tick bite of left lower leg 06/21/2016   Chronic fatigue 06/21/2016   History of pituitary surgery 09/26/2015   ADHD (attention deficit hyperactivity disorder) 05/04/2015   Anxiety state  03/08/2015   Upper respiratory tract infection 11/30/2014   Acute appendicitis 07/28/2014   Other malaise and fatigue 03/09/2014   Unspecified vitamin D deficiency 03/09/2014   Postpartum care following vaginal delivery (12/18) 10/08/2013   Normal labor and delivery 10/08/2013   Chronic migraine 01/20/2013   Headache, chronic migraine without aura, intractable 03/18/2012   Myalgia and myositis 03/18/2012   Lumbar spondylosis 03/18/2012   PERSONAL HISTORY OF ALLERGY TO EGGS- GI SENSITIVITY 05/04/2010   CHANGE IN BOWELS 05/02/2010   IRRITABLE BOWEL SYNDROME 12/04/2009   NONSPECIFIC ABN FINDNG RAD&OTH EXAM BILARY TRCT 11/30/2009   GERD 01/31/2009   ABDOMINAL PAIN-EPIGASTRIC 01/31/2009   PERSONAL HX COLONIC POLYPS 12/14/2008   INTERNAL HEMORRHOIDS 01/03/2007   HIATAL HERNIA 09/29/2004    Hilari Wethington C. Yassmin Binegar PT, DPT 03/13/21 4:52 PM   [facility] [place], [place], [address] Phone: [phone]   Fax:  [phone]  Name: TAJANAE GUILBAULT MRN: [id number] Date of Birth: May 11, 1976  PHYSICAL THERAPY DISCHARGE SUMMARY  Visits from Start of Care: 5  Current functional level related to goals / functional outcomes: See above   Remaining deficits: See above   Education / Equipment: Anatomy of condition, POC, HEP, exercise form/rationale   Patient agrees to discharge. Patient goals were partially met. Patient is being discharged due to not returning since the last visit. Evalyne Cortopassi C. Gibbs Naugle PT, DPT 06/22/21 12:00 PM

## 2021-03-21 ENCOUNTER — Telehealth: Payer: Self-pay | Admitting: *Deleted

## 2021-03-21 ENCOUNTER — Ambulatory Visit: Payer: Medicare Other | Admitting: Neurology

## 2021-03-21 ENCOUNTER — Ambulatory Visit (INDEPENDENT_AMBULATORY_CARE_PROVIDER_SITE_OTHER): Payer: Medicare Other | Admitting: Neurology

## 2021-03-21 ENCOUNTER — Encounter: Payer: Self-pay | Admitting: Neurology

## 2021-03-21 VITALS — BP 124/66 | HR 89 | Ht 66.0 in | Wt 165.5 lb

## 2021-03-21 DIAGNOSIS — F32A Depression, unspecified: Secondary | ICD-10-CM

## 2021-03-21 DIAGNOSIS — G43709 Chronic migraine without aura, not intractable, without status migrainosus: Secondary | ICD-10-CM

## 2021-03-21 MED ORDER — RIZATRIPTAN BENZOATE 10 MG PO TBDP: 10.0000 mg | ORAL_TABLET | ORAL | 11 refills | Status: DC | PRN

## 2021-03-21 MED ORDER — VENLAFAXINE HCL ER 37.5 MG PO CP24: 37.5000 mg | ORAL_CAPSULE | Freq: Every day | ORAL | 11 refills | Status: DC

## 2021-03-21 NOTE — Telephone Encounter (Signed)
She arrived late to her appt but was able to be seen.

## 2021-03-21 NOTE — Progress Notes (Signed)
**  Botox 100 units x 2 vials, NDC 3736-6815-94, Lot [id number], Exp 02/2023, office supply.//mck,rn**

## 2021-03-21 NOTE — Telephone Encounter (Signed)
No showed Botox appointment. 

## 2021-03-21 NOTE — Progress Notes (Signed)
No chief complaint on file.     PATIENT: [name] [name] DOB: [birth year]    HISTORICAL  [name] [name] is a 45 years old right-handed female, seen in refer by primary care physician Dr. Nancy Fetter for evaluation of frequent headaches  I reviewed and summarized office note, she had a history of pituitary adenoma, had trans-sphenoid pituitary adenoma resection surgery at [facility] in 1999, prior to the surgery, she had breast discharge, frequent migraine headache with aura, occasionally double vision, most recent MRI of the brain in record was in 2001, there was no evidence of recurrent tumor.   She was a patient of our clinic in 2008 by Dr. Gaynell Face, for chronic migraine, she was also evaluated by [facility] in past, per record, she has tried and failed multiple preventive medications, Topamax, Depakote, nortriptyline, gabapentin, Lyrica, Inderal she has difficulty tolerating the medications  magnesium oxide, riboflavin, coenzyme Q 10 the-counter medications, Relpax, also tried dietary supplement magnesium, riboflavin, coenzyme Q 10,  For abortive treatment, she also tried and failed Maxalt, Relpax, Zomig, Frova, Imitrex tablet, she is currently taking Imitrex nasal spray as needed, it helped her about 50% of the time if she take the medicine during early onset of headaches  since her most recent pregnancy in 2014, she began to have gradual worsening migraine headaches, since 2015, she has about 5-6 migraine headaches each week, trigger for her migraines are strong smells, bright light, food additives, hungry, stress, exertion, smoking  Her typical migraine are right retro-orbital area severe pounding headache with associated light noise sensitivity, nauseous, lasting one day,  For now, she was receiving Botox injection with Dr. [name] at [facility] since 2014, which did helped her migraine, but has difficulty affording the bills  I have reviewed MRI of lumbar reported without contrast  from [facility] February 05 2015, multilevel degenerative disc disease, no significant foraminal or canal stenosis. Laboratory result August 30 first 2016, low normal TSH 0.7, normal CMP, with creatinine 0.8, vitamin D was 29.2, T3 total was within normal limits 85.5, normal CBC with hemoglobin of 12 point 3, normal PTH, free T4, UDS was negative  UPDATE Nov 03 2015: She has tried verapamil 40 mg twice a day for 3 weeks, complains of GI side effect, dizziness, is no longer taking it, it did not help her headache either. Insurance would not cover [place], she is now taking Imitrex nasal spray or tablet 100mg  1-2/week, oxycodone 5mg  prn (5 days a week), Flexeril 10mg  as needed for headaches,  We have reviewed MRI of the brain with and without contrast: Postoperative changes with in the sphenoid and sella turcica consistent with transsphenoidal pituitary microadenoma resection in the past. The pituitary gland has a normal signal, currently. There are several small subcortical T2/FLAIR hyperintense foci in the frontal lobes. This is a nonspecific finding but could be due to the patient's history of migraine and could also represent chronic microvascular ischemic change.   She still has headaches daily, couple times each week, her headache evolved to a more severe prolonged migraine headaches, her headache is usually  triggered by smells, muscle tightness at her neck and shoulder, hungry  UPDATE Mar 14 2016:  Patient has received Botox injection through: Rehabilitation Dr. [name] office for many years, reported mild neck extension weakness during her first injection around 2014, most recent injection was in March 2016, she received 160 units, denies significant side effect, she reported significant improvement of her migraine headaches with Botox injection  UPDATE  June 21 2016: Today she came in complains of whole body achy pain, under a lot of stress at home, reported a history of tick bite in 2002  to left thigh, with bull's eye skin lesion, now concern of possibility of Lyme disease  I have personally reviewed MRI of the brain with and without contrast in Dec 2016,  1.   Postoperative changes with in the sphenoid and sella turcica consistent with transsphenoidal pituitary microadenoma resection in the past. The pituitary gland has a normal signal, currently. 2.   There are several small subcortical T2/FLAIR hyperintense foci in the frontal lobes. This is a nonspecific finding but could be due to the patient's history of migraine and could also represent chronic microvascular ischemic change. Demyelination is less likely. The brain is otherwise normal. 3.   There are no acute findings.  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   Postoperative changes with in the sphenoid and sella turcica consistent with transsphenoidal pituitary microadenoma resection in the past. The pituitary gland has a normal signal, currently. 2.   There are several small subcortical T2/FLAIR hyperintense foci in the frontal lobes. This is a nonspecific finding but could be  due to the patient's history of migraine and could also represent chronic microvascular ischemic change. Demyelination is less likely. The brain is otherwise normal. 3.   There are no acute findings.  We went over the possible medication treatment in detail,  UPDATE Oct 23 2017: Last injection was in April 2018, she responded very well, because of the insurance reasons, has such delay of repeat Botox injection, also complains of generalized fatigue, difficulty focusing,  UPDATE Nov 21 2017: 2 weeks after his injection on October 23, 2017, also under the extreme family stress, she woke up one day severe right-sided headache, noted right-sided droopy eyelid, intermittent blurry vision, her right side blurred vision but overall has much improved, but still has noticeable difference on examination  MRI of the brain on November 15, 2017 showed mild supratentorium small vessel disease consistent with a long history of migraine,  She complains of frequent migraine headaches, but this is in the setting of extreme family tension,  UPDATE Nov 20 2018: Following Ajovy injection in April 2019, for 2 consecutive months, it did help her migraine, her migraine become less frequent, less severe, previously Botox was very helpful, she decided to stop Botox for a while because developed droopy eyelid on 1 injection, but today, she wants to go back on Botox as preventive medication,  She is now having headache days 15 days in a month, related to her menstruation cycle, with significant light noise smells sensitivity, she is  taking Fioricet, Imitrex, Flexeril as needed, she also complains of frequent neck pain, tension,  UPDATE April 23 2019: She continue have significant headaches, contributed to the anxiety, complains of snoring, previously has the diagnosis of obstructive sleep apnea  UPDATE Sept 30 2020: She continues to have frequent headaches, Botox injection was helpful  UPDATE Mar 16 2020: Her whole family  suffered COVID-19 infection at the end of March 2021, since then she noticed increased fatigue, forgetfulness, memory loss, frequent headaches during headache, nausea has improved  She is taking Fioricet, Maxalt as needed,  UPDATE Sept 22 2021: Migraine headache overall has much improved, much less frequent, less severe, but she still deals with 1-3 migraines each week, taking Fioricet as needed, Maxalt is not covered by her insurance  Update Mar 21, 2021: She reported significant stress, increased headache over the past few months, last Botox injection was more than 6 months ago due to financial concerns, and insurance issues, she would like to add on p.o. preventive medications,  REVIEW OF SYSTEMS: Full 14 system review of systems performed and notable only for as above All rest review of the system were negative  ALLERGIES: Allergies  Allergen Reactions  . Latex Rash  . Verapamil Nausea Only    Headache, dizziness    HOME MEDICATIONS: Current Outpatient Prescriptions  Medication Sig Dispense Refill  . buPROPion (WELLBUTRIN SR) 150 MG 12 hr tablet Take 150 mg by mouth daily.  2  . cyclobenzaprine (FLEXERIL) 5 MG tablet Take 5 mg by mouth 3 (three) times daily as needed.  0  . lamoTRIgine (LAMICTAL) 100 MG tablet 1/2 po bid xone week, then one po bid 60 tablet 11  . Multiple Vitamin (MULTIVITAMIN) capsule Take 1 capsule by mouth daily.    Marland Kitchen omega-3 acid ethyl esters (LOVAZA) 1 g capsule Take 1 capsule (1 g total) by mouth daily. 30 capsule 6  . oxyCODONE (OXY IR/ROXICODONE) 5 MG immediate release tablet Take 5 mg by mouth as needed.  0  . SUMAtriptan (IMITREX) 20 MG/ACT nasal spray Place 1 spray (20 mg total) into the nose once. May repeat in 2 hours if headache persists or recurs. 12 Inhaler 6   No current facility-administered medications for this visit.    PAST MEDICAL HISTORY: Past Medical History  Diagnosis Date  . Seasonal allergies   . GERD (gastroesophageal reflux  disease)   . IBS (irritable bowel syndrome)   . Adenomatous colon polyp 2008  . Constipation   . Sciatica   . Hematochezia   . Internal hemorrhoids   . 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 Complication of anesthesia     "didn't wake up well/remembered stuff from during OR when I had pituitary surgery"  . DVT (deep vein thrombosis) in pregnancy 09/2013    "LLE; postpartum"  . Sleep apnea     "don't currently use CPAP" (07/28/2014)  . 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 H/O hiatal hernia   . Migraines     "couple times/wk" (07/28/2014)  . Arthritis     "jaw joints; neck" (07/28/2014)  . Fibromyalgia   . Depression     hx  . Anxiety 1999/2000  . Panic disorder 1999/2000    PAST SURGICAL HISTORY: Past Surgical History  Procedure Laterality Date  . Transphenoidal / transnasal hypophysectomy / resection pituitary tumor  1999    resection of benign pituitary tumor, [facility]  . Colonoscopy  01/03/2007    Dr. Silvano Rusk  .  Esophagogastroduodenoscopy  09/29/2004    Dr. Kennedy Bucker  . Uterine fibroid surgery  2008  . Appendectomy  07/28/2014  . Laparoscopic appendectomy N/A 07/28/2014    Procedure: APPENDECTOMY LAPAROSCOPIC;  Surgeon: Autumn Messing III, MD;  Location: [facility] OR;  Service: General;  Laterality: N/A;    FAMILY HISTORY: Family History  Problem Relation Age of Onset  . Heart disease Mother   . Diabetes Mother   . Heart disease Father   . Prostate cancer Father   . Heart disease    . Prostate cancer    . Diabetes    . Colon cancer Maternal Grandmother   . Alzheimer's disease Father     SOCIAL HISTORY:  Social History   Social History  . Marital Status: Married    Spouse Name: N/A  . Number of Children: 3  . Years of Education: College   Occupational History  . Homemaker    Social History Main Topics  . Smoking status: Never Smoker   . Smokeless tobacco: Never Used  . Alcohol Use: 0.6 oz/week    1 Glasses of wine per week      Comment: Rarely - less than one drink every few months  . Drug Use: No  . Sexual Activity: Yes   Other Topics Concern  . Not on file   Social History Narrative   Lives at home with her husband and three children.   Right-handed.   Rare caffeine use.     PHYSICAL EXAM   There were no vitals filed for this visit.  Not recorded      ASSESSMENT AND PLAN  COTY STUDENT is a 45 y.o. female    Chronic migraine headaches  Tried and failed multiple different preventive medications in the past, Topamax, lamotrigine nortriptyline, Inderal, gabapentin, Lyrica, verapmil could not tolerate multiple medications due to side effect   Continue Maxalt dissolvable, plus Zofran,Aleve, Flexeril as needed for moderate migraine  Imitrex subcutaneous injection may be used for more severe migraine  She was receiving Botox injection every 3-4 months, which has provided moderate relief of her migraine headaches, but she developed right-sided droopy eyelid 2 weeks following her most recent injection on October 23, 2017, recover to baseline after 3 to 4 weeks,  She tried Ajovy injection in April and May 2019, reported no significant improvement, after discussed with patient, she wants to go back on Botox as migraine prevention  repeat MRI of the brain showed no acute abnormality, right side supratentorium small vessel disease consistent with long history of migraine headaches,  She also had a history of pituitary adenoma, status post transsphenoidal resection  She also has significant stress at home  Botox injection for chronic migraine prevention, injection was performed according to [place] protocol,  5 units of Botox was injected into each side, for 31 injection sites, total of 155 units  Bilateral corrugate 2 injection sites. Bilateral frontalis 4 injection sites Procerus 1 injection sites. Bilateral temporalis 8 injection sites Bilateral occipitalis 6 injection sites Bilateral cervical  paraspinals 4 injection sites Bilateral upper trapezius 6 injection sites  Extra 45 unites were injected into bilateral upper cervical paraspinal muscles  Effexor XR 37.5 mg as migraine prevention, Maxalt as needed    Marcial Pacas, M.D. Ph.D  [facility] [address], [place], [place] [address] Ph: [phone] Fax: [phone]  CC: Sandi Mariscal

## 2021-03-23 DIAGNOSIS — G43709 Chronic migraine without aura, not intractable, without status migrainosus: Secondary | ICD-10-CM

## 2021-03-23 MED ORDER — ONABOTULINUMTOXINA 100 UNITS IJ SOLR
200.0000 [IU] | Freq: Once | INTRAMUSCULAR | Status: AC
  Administered 2021-03-23: 200 [IU] via INTRAMUSCULAR

## 2021-04-06 ENCOUNTER — Ambulatory Visit
Admission: RE | Admit: 2021-04-06 | Discharge: 2021-04-06 | Disposition: A | Discharge status: Home / Self Care | Payer: Medicare Other | Source: Ambulatory Visit | Attending: Orthopaedic Surgery | Admitting: Orthopaedic Surgery

## 2021-04-06 ENCOUNTER — Other Ambulatory Visit: Payer: Medicare Other

## 2021-04-06 ENCOUNTER — Other Ambulatory Visit: Payer: Self-pay

## 2021-04-06 DIAGNOSIS — M545 Low back pain, unspecified: Secondary | ICD-10-CM

## 2021-04-21 ENCOUNTER — Other Ambulatory Visit: Payer: Self-pay | Admitting: Neurology

## 2021-06-21 ENCOUNTER — Other Ambulatory Visit: Payer: Self-pay

## 2021-06-21 ENCOUNTER — Ambulatory Visit: Payer: Medicare Other | Admitting: Neurology

## 2021-06-21 ENCOUNTER — Ambulatory Visit (INDEPENDENT_AMBULATORY_CARE_PROVIDER_SITE_OTHER): Payer: Medicare Other | Admitting: Neurology

## 2021-06-21 ENCOUNTER — Encounter: Payer: Self-pay | Admitting: Neurology

## 2021-06-21 VITALS — BP 115/62 | HR 95 | Ht 66.0 in | Wt 156.5 lb

## 2021-06-21 DIAGNOSIS — G43709 Chronic migraine without aura, not intractable, without status migrainosus: Secondary | ICD-10-CM

## 2021-06-21 MED ORDER — ONABOTULINUMTOXINA 100 UNITS IJ SOLR
200.0000 [IU] | Freq: Once | INTRAMUSCULAR | Status: AC
  Administered 2021-06-21: 200 [IU] via INTRAMUSCULAR

## 2021-06-21 NOTE — Progress Notes (Signed)
No chief complaint on file.     PATIENT: [name] [name] DOB: 08-Feb-1976    HISTORICAL  [name] [name] is a 45 years old right-handed female, seen in refer by primary care physician Dr. Nancy Fetter for evaluation of frequent headaches  I reviewed and summarized office note, she had a history of pituitary adenoma, had trans-sphenoid pituitary adenoma resection surgery at [facility] in 1999, prior to the surgery, she had breast discharge, frequent migraine headache with aura, occasionally double vision, most recent MRI of the brain in record was in 2001, there was no evidence of recurrent tumor.   She was a patient of our clinic in 2008 by Dr. Gaynell Face, for chronic migraine, she was also evaluated by [facility] in past, per record, she has tried and failed multiple preventive medications, Topamax, Depakote, nortriptyline, gabapentin, Lyrica, Inderal she has difficulty tolerating the medications  magnesium oxide, riboflavin, coenzyme Q 10 the-counter medications, Relpax, also tried dietary supplement magnesium, riboflavin, coenzyme Q 10,  For abortive treatment, she also tried and failed Maxalt, Relpax, Zomig, Frova, Imitrex tablet, she is currently taking Imitrex nasal spray as needed, it helped her about 50% of the time if she take the medicine during early onset of headaches  since her most recent pregnancy in 2014, she began to have gradual worsening migraine headaches, since 2015, she has about 5-6 migraine headaches each week, trigger for her migraines are strong smells, bright light, food additives, hungry, stress, exertion, smoking  Her typical migraine are right retro-orbital area severe pounding headache with associated light noise sensitivity, nauseous, lasting one day,  For now, she was receiving Botox injection with Dr. [name] at [facility] since 2014, which did helped her migraine, but has difficulty affording the bills  I have reviewed MRI of lumbar reported without contrast  from [facility] February 05 2015, multilevel degenerative disc disease, no significant foraminal or canal stenosis. Laboratory result August 30 first 2016, low normal TSH 0.7, normal CMP, with creatinine 0.8, vitamin D was 29.2, T3 total was within normal limits 85.5, normal CBC with hemoglobin of 12 point 3, normal PTH, free T4, UDS was negative  UPDATE Nov 03 2015: She has tried verapamil 40 mg twice a day for 3 weeks, complains of GI side effect, dizziness, is no longer taking it, it did not help her headache either. Insurance would not cover [place], she is now taking Imitrex nasal spray or tablet '100mg'$  1-2/week, oxycodone '5mg'$  prn (5 days a week), Flexeril '10mg'$  as needed for headaches,  We have reviewed MRI of the brain with and without contrast: Postoperative changes with in the sphenoid and sella turcica consistent with transsphenoidal pituitary microadenoma resection in the past. The pituitary gland has a normal signal, currently. There are several small subcortical T2/FLAIR hyperintense foci in the frontal lobes. This is a nonspecific finding but could be due to the patient's history of migraine and could also represent chronic microvascular ischemic change.   She still has headaches daily, couple times each week, her headache evolved to a more severe prolonged migraine headaches, her headache is usually  triggered by smells, muscle tightness at her neck and shoulder, hungry  UPDATE Mar 14 2016:  Patient has received Botox injection through: Rehabilitation Dr. [name] office for many years, reported mild neck extension weakness during her first injection around 2014, most recent injection was in March 2016, she received 160 units, denies significant side effect, she reported significant improvement of her migraine headaches with Botox injection  UPDATE  June 21 2016: Today she came in complains of whole body achy pain, under a lot of stress at home, reported a history of tick bite in 2002  to left thigh, with bull's eye skin lesion, now concern of possibility of Lyme disease  I have personally reviewed MRI of the brain with and without contrast in Dec 2016,  1.   Postoperative changes with in the sphenoid and sella turcica consistent with transsphenoidal pituitary microadenoma resection in the past. The pituitary gland has a normal signal, currently. 2.   There are several small subcortical T2/FLAIR hyperintense foci in the frontal lobes. This is a nonspecific finding but could be due to the patient's history of migraine and could also represent chronic microvascular ischemic change. Demyelination is less likely. The brain is otherwise normal. 3.   There are no acute findings.  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   Postoperative changes with in the sphenoid and sella turcica consistent with transsphenoidal pituitary microadenoma resection in the past. The pituitary gland has a normal signal, currently. 2.   There are several small subcortical T2/FLAIR hyperintense foci in the frontal lobes. This is a nonspecific finding but could be  due to the patient's history of migraine and could also represent chronic microvascular ischemic change. Demyelination is less likely. The brain is otherwise normal. 3.   There are no acute findings.  We went over the possible medication treatment in detail,  UPDATE Oct 23 2017: Last injection was in April 2018, she responded very well, because of the insurance reasons, has such delay of repeat Botox injection, also complains of generalized fatigue, difficulty focusing,  UPDATE Nov 21 2017: 2 weeks after his injection on October 23, 2017, also under the extreme family stress, she woke up one day severe right-sided headache, noted right-sided droopy eyelid, intermittent blurry vision, her right side blurred vision but overall has much improved, but still has noticeable difference on examination  MRI of the brain on November 15, 2017 showed mild supratentorium small vessel disease consistent with a long history of migraine,  She complains of frequent migraine headaches, but this is in the setting of extreme family tension,  UPDATE Nov 20 2018: Following Ajovy injection in April 2019, for 2 consecutive months, it did help her migraine, her migraine become less frequent, less severe, previously Botox was very helpful, she decided to stop Botox for a while because developed droopy eyelid on 1 injection, but today, she wants to go back on Botox as preventive medication,  She is now having headache days 15 days in a month, related to her menstruation cycle, with significant light noise smells sensitivity, she is  taking Fioricet, Imitrex, Flexeril as needed, she also complains of frequent neck pain, tension,  UPDATE April 23 2019: She continue have significant headaches, contributed to the anxiety, complains of snoring, previously has the diagnosis of obstructive sleep apnea  UPDATE Sept 30 2020: She continues to have frequent headaches, Botox injection was helpful  UPDATE Mar 16 2020: Her whole family  suffered COVID-19 infection at the end of March 2021, since then she noticed increased fatigue, forgetfulness, memory loss, frequent headaches during headache, nausea has improved  She is taking Fioricet, Maxalt as needed,  UPDATE Sept 22 2021: Migraine headache overall has much improved, much less frequent, less severe, but she still deals with 1-3 migraines each week, taking Fioricet as needed, Maxalt is not covered by her insurance  Update Mar 21, 2021: She reported significant stress, increased headache over the past few months, last Botox injection was more than 6 months ago due to financial concerns, and insurance issues, she would like to add on p.o. preventive medications,  UPDATE June 21 2021: Her migraine is overall under reasonable control, Botox injection every 3 months has been helpful, she reported 1 episode woke up in the middle of the last night veered to the left side, still present today,  REVIEW OF SYSTEMS: Full 14 system review of systems performed and notable only for as above All rest review of the system were negative  ALLERGIES: Allergies  Allergen Reactions   Latex Rash   Verapamil Nausea Only    Headache, dizziness    HOME MEDICATIONS: Current Outpatient Prescriptions  Medication Sig Dispense Refill   buPROPion (WELLBUTRIN SR) 150 MG 12 hr tablet Take 150 mg by mouth daily.  2   cyclobenzaprine (FLEXERIL) 5 MG tablet Take 5 mg by mouth 3 (three) times daily as needed.  0   lamoTRIgine (LAMICTAL) 100 MG tablet 1/2 po bid xone week, then one po bid 60 tablet 11   Multiple Vitamin (MULTIVITAMIN) capsule Take 1 capsule by mouth daily.     omega-3 acid ethyl esters (LOVAZA) 1 g capsule Take 1 capsule (1 g total) by mouth daily. 30 capsule 6   oxyCODONE (OXY IR/ROXICODONE) 5 MG immediate release tablet Take 5 mg by mouth as needed.  0   SUMAtriptan (IMITREX) 20 MG/ACT nasal spray Place 1 spray (20 mg total) into the nose once. May repeat in 2 hours if headache  persists or recurs. 12 Inhaler 6   No current facility-administered medications for this visit.    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, rectum   Complication of anesthesia     "didn't wake up well/remembered stuff from during OR when I had pituitary surgery"   DVT (deep vein thrombosis) in pregnancy 09/2013    "LLE; postpartum"   Sleep apnea     "don't currently use CPAP" (07/28/2014)   Prolactin secreting pituitary adenoma ([place]) 1999   H/O hiatal hernia    Migraines     "couple times/wk" (07/28/2014)   Arthritis     "jaw joints; neck" (07/28/2014)   Fibromyalgia    Depression     hx   Anxiety 1999/2000   Panic disorder 1999/2000    PAST SURGICAL HISTORY: Past  Surgical History  Procedure Laterality Date   Transphenoidal / transnasal hypophysectomy / resection pituitary tumor  1999    resection of benign pituitary tumor, [facility]   Colonoscopy  01/03/2007    Dr. Silvano Rusk   Esophagogastroduodenoscopy  09/29/2004    Dr. Kennedy Bucker   Uterine fibroid surgery  2008   Appendectomy  07/28/2014   Laparoscopic appendectomy N/A 07/28/2014    Procedure: APPENDECTOMY LAPAROSCOPIC;  Surgeon: Autumn Messing III, MD;  Location: [facility];  Service: General;  Laterality: N/A;    FAMILY HISTORY: Family History  Problem Relation Age of Onset   Heart disease Mother    Diabetes Mother    Heart disease Father    Prostate cancer Father    Heart disease     Prostate cancer     Diabetes     Colon cancer Maternal Grandmother    Alzheimer's disease Father     SOCIAL HISTORY:  Social History   Social History   Marital Status: Married    Spouse Name: N/A   Number of Children: 3   Years of Education: Engineer, agricultural History   Homemaker    Social History Main Topics   Smoking status: Never Smoker    Smokeless tobacco: Never Used   Alcohol Use: 0.6 oz/week    1 Glasses of wine per week     Comment: Rarely - less than one drink every few months   Drug Use: No   Sexual Activity: Yes   Other Topics Concern   Not on file   Social History Narrative   Lives at home with her husband and three children.   Right-handed.   Rare caffeine use.     PHYSICAL EXAM  PHYSICAL EXAMNIATION:  Gen: NAD, conversant, well nourised, well groomed              NEUROLOGICAL EXAM:  MENTAL STATUS: Speech/Cognition: Awake, alert, normal speech, oriented to history taking and casual conversation.  CRANIAL NERVES: CN II: Visual fields are full to confrontation.  Pupils are round equal and briskly reactive to light. CN III, IV, VI: extraocular movement are normal. No ptosis. CN V: Facial sensation is intact to light touch. CN VII: Face is symmetric with normal eye closure and smile. CN VIII: Hearing is normal to casual conversation CN IX, X: Palate elevates symmetrically. Phonation is normal. CN XI: Head turning and shoulder shrug are intact   MOTOR: Muscle bulk and tone are normal. Muscle strength is normal.    COORDINATION: There is no trunk or limb ataxia.    GAIT/STANCE: Posture is normal. Gait is steady with normal steps, base, arm swing and turning.     ASSESSMENT AND PLAN  [name] [name] is a 45 y.o. female    Chronic migraine headaches  Tried and failed multiple different preventive medications in the past, Topamax, lamotrigine nortriptyline, Inderal, gabapentin, Lyrica, verapmil could not tolerate multiple medications due to side effect   Continue Maxalt dissolvable, plus Zofran,Aleve, Flexeril as needed for moderate migraine  Imitrex subcutaneous injection may be used for more severe migraine  She was receiving Botox injection every 3-4 months, which has provided moderate  relief of her migraine headaches, but she developed right-sided droopy eyelid 2 weeks following her most recent injection on October 23, 2017, recover to baseline after 3 to 4 weeks,  She tried Ajovy injection in April and May 2019, reported no significant improvement, after discussed with patient, she wants to go back on  Botox as migraine prevention  repeat MRI of the brain showed no acute abnormality, right side supratentorium small vessel disease consistent with long history of migraine headaches,  She also had a history of pituitary adenoma, status post transsphenoidal resection  She also has significant stress at home  Botox injection for chronic migraine prevention, injection was performed according to [place] protocol,  5 units of Botox was injected into each side, for 31 injection sites, total of 155 units  Bilateral corrugate 2 injection sites. Bilateral frontalis 4 injection sites Procerus 1 injection sites. Bilateral temporalis 8 injection sites Bilateral occipitalis 6 injection sites Bilateral cervical paraspinals 4 injection sites Bilateral upper trapezius 6 injection sites  Extra 45 unites were injected into bilateral upper cervical paraspinal muscles, masseters muscles  Effexor XR 37.5 mg as migraine prevention, Maxalt as needed    Marcial Pacas, M.D. Ph.D  [facility] [address] [place], [place] [address] Ph: [phone] Fax: [phone]  CC: Sandi Mariscal

## 2021-06-21 NOTE — Progress Notes (Signed)
**  Botox 100 units x 2 vials, NDC [id number], Lot [id number], Exp 06/2022, office supply.//mck,[email or web address]**

## 2021-08-28 ENCOUNTER — Telehealth: Payer: Self-pay | Admitting: Neurology

## 2021-08-28 ENCOUNTER — Encounter: Payer: Self-pay | Admitting: Student

## 2021-08-28 NOTE — Telephone Encounter (Signed)
Pt called, started having dizziness last night. Last migraine was last week for several days. Would like a call from the nurse to discuss if the dizziness a side effect of Botox.

## 2021-08-28 NOTE — Telephone Encounter (Signed)
I spoke to the patient. She has been having intermittent episodes of spinning dizziness. Symptoms cause some nausea and feelings of being off balance. This occurs with and without the presence of a migraine. It is not positional. The last episode was one month ago. She has an appt with her gynecologist this afternoon and she plans to bring this up. May just be Vertigo. However, if she needs an work-up here then she will call back to let me know.

## 2021-09-07 ENCOUNTER — Other Ambulatory Visit: Payer: Self-pay | Admitting: Neurology

## 2021-09-20 ENCOUNTER — Encounter: Payer: Self-pay | Admitting: Neurology

## 2021-09-20 ENCOUNTER — Telehealth: Payer: Self-pay | Admitting: Neurology

## 2021-09-20 ENCOUNTER — Ambulatory Visit (INDEPENDENT_AMBULATORY_CARE_PROVIDER_SITE_OTHER): Payer: Medicare Other | Admitting: Neurology

## 2021-09-20 VITALS — BP 124/85 | HR 102 | Ht 66.0 in | Wt 156.0 lb

## 2021-09-20 DIAGNOSIS — L74510 Primary focal hyperhidrosis, axilla: Secondary | ICD-10-CM | POA: Diagnosis not present

## 2021-09-20 DIAGNOSIS — G43709 Chronic migraine without aura, not intractable, without status migrainosus: Secondary | ICD-10-CM | POA: Diagnosis not present

## 2021-09-20 MED ORDER — ONABOTULINUMTOXINA 100 UNITS IJ SOLR
200.0000 [IU] | Freq: Once | INTRAMUSCULAR | Status: AC
  Administered 2021-09-20: 200 [IU] via INTRAMUSCULAR

## 2021-09-20 MED ORDER — SUMATRIPTAN 20 MG/ACT NA SOLN: NASAL | 11 refills | Status: DC

## 2021-09-20 NOTE — Telephone Encounter (Signed)
Patient would like to see Dr. Krista Blue for her next visit. Wednesday or Thursday before 2pm.

## 2021-09-20 NOTE — Progress Notes (Signed)
No chief complaint on file.      HISTORICAL  [name] [name] is a 45 years old right-handed female, seen in refer by primary care physician Dr. Nancy Fetter for evaluation of frequent headaches  I reviewed and summarized office note, she had a history of pituitary adenoma, had trans-sphenoid pituitary adenoma resection surgery at [facility] in 1999, prior to the surgery, she had breast discharge, frequent migraine headache with aura, occasionally double vision, most recent MRI of the brain in record was in 2001, there was no evidence of recurrent tumor.   She was a patient of our clinic in 2008 by Dr. Gaynell Face, for chronic migraine, she was also evaluated by [facility] in past, per record, she has tried and failed multiple preventive medications, Topamax, Depakote, nortriptyline, gabapentin, Lyrica, Inderal she has difficulty tolerating the medications  magnesium oxide, riboflavin, coenzyme Q 10 the-counter medications, Relpax, also tried dietary supplement magnesium, riboflavin, coenzyme Q 10,  For abortive treatment, she also tried and failed Maxalt, Relpax, Zomig, Frova, Imitrex tablet, she is currently taking Imitrex nasal spray as needed, it helped her about 50% of the time if she take the medicine during early onset of headaches  since her most recent pregnancy in 2014, she began to have gradual worsening migraine headaches, since 2015, she has about 5-6 migraine headaches each week, trigger for her migraines are strong smells, bright light, food additives, hungry, stress, exertion, smoking  Her typical migraine are right retro-orbital area severe pounding headache with associated light noise sensitivity, nauseous, lasting one day,  For now, she was receiving Botox injection with Dr. [name] at [facility] rehabilitation since 2014, which did helped her migraine, but has difficulty affording the bills  I have reviewed MRI of lumbar reported without contrast from [facility] February 05 2015,  multilevel degenerative disc disease, no significant foraminal or canal stenosis. Laboratory result August 30 first 2016, low normal TSH 0.7, normal CMP, with creatinine 0.8, vitamin D was 29.2, T3 total was within normal limits 85.5, normal CBC with hemoglobin of 12 point 3, normal PTH, free T4, UDS was negative  UPDATE Nov 03 2015: She has tried verapamil 40 mg twice a day for 3 weeks, complains of GI side effect, dizziness, is no longer taking it, it did not help her headache either. Insurance would not cover [place], she is now taking Imitrex nasal spray or tablet 100mg  1-2/week, oxycodone 5mg  prn (5 days a week), Flexeril 10mg  as needed for headaches,  We have reviewed MRI of the brain with and without contrast: Postoperative changes with in the sphenoid and sella turcica consistent with transsphenoidal pituitary microadenoma resection in the past. The pituitary gland has a normal signal, currently. There are several small subcortical T2/FLAIR hyperintense foci in the frontal lobes. This is a nonspecific finding but could be due to the patient's history of migraine and could also represent chronic microvascular ischemic change.   She still has headaches daily, couple times each week, her headache evolved to a more severe prolonged migraine headaches, her headache is usually  triggered by smells, muscle tightness at her neck and shoulder, hungry  UPDATE Mar 14 2016:  Patient has received Botox injection through: Rehabilitation Dr. [name] office for many years, reported mild neck extension weakness during her first injection around 2014, most recent injection was in March 2016, she received 160 units, denies significant side effect, she reported significant improvement of her migraine headaches with Botox injection  UPDATE June 21 2016: Today she came in complains  of whole body achy pain, under a lot of stress at home, reported a history of tick bite in 2002 to left thigh, with bull's eye skin  lesion, now concern of possibility of Lyme disease  I have personally reviewed MRI of the brain with and without contrast in Dec 2016,  1.   Postoperative changes with in the sphenoid and sella turcica consistent with transsphenoidal pituitary microadenoma resection in the past. The pituitary gland has a normal signal, currently. 2.   There are several small subcortical T2/FLAIR hyperintense foci in the frontal lobes. This is a nonspecific finding but could be due to the patient's history of migraine and could also represent chronic microvascular ischemic change. Demyelination is less likely. The brain is otherwise normal. 3.   There are no acute findings.  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   Postoperative changes with in the sphenoid and sella turcica consistent with transsphenoidal pituitary microadenoma resection in the past. The pituitary gland has a normal signal, currently. 2.   There are several small subcortical T2/FLAIR hyperintense foci in the frontal lobes. This is a nonspecific finding but could be due to the patient's history of  migraine and could also represent chronic microvascular ischemic change. Demyelination is less likely. The brain is otherwise normal. 3.   There are no acute findings.  We went over the possible medication treatment in detail,  UPDATE Oct 23 2017: Last injection was in April 2018, she responded very well, because of the insurance reasons, has such delay of repeat Botox injection, also complains of generalized fatigue, difficulty focusing,  UPDATE Nov 21 2017: 2 weeks after his injection on October 23, 2017, also under the extreme family stress, she woke up one day severe right-sided headache, noted right-sided droopy eyelid, intermittent blurry vision, her right side blurred vision but overall has much improved, but still has noticeable difference on examination  MRI of the brain on November 15, 2017 showed mild supratentorium small vessel disease consistent with a long history of migraine,  She complains of frequent migraine headaches, but this is in the setting of extreme family tension,  UPDATE Nov 20 2018: Following Ajovy injection in April 2019, for 2 consecutive months, it did help her migraine, her migraine become less frequent, less severe, previously Botox was very helpful, she decided to stop Botox for a while because developed droopy eyelid on 1 injection, but today, she wants to go back on Botox as preventive medication,  She is now having headache days 15 days in a month, related to her menstruation cycle, with significant light noise smells sensitivity, she is taking Fioricet, Imitrex, Flexeril as needed, she also  complains of frequent neck pain, tension,  UPDATE April 23 2019: She continue have significant headaches, contributed to the anxiety, complains of snoring, previously has the diagnosis of obstructive sleep apnea  UPDATE Sept 30 2020: She continues to have frequent headaches, Botox injection was helpful  UPDATE Mar 16 2020: Her whole family suffered COVID-19 infection at  the end of March 2021, since then she noticed increased fatigue, forgetfulness, memory loss, frequent headaches during headache, nausea has improved  She is taking Fioricet, Maxalt as needed,  UPDATE Sept 22 2021: Migraine headache overall has much improved, much less frequent, less severe, but she still deals with 1-3 migraines each week, taking Fioricet as needed, Maxalt is not covered by her insurance  Update Mar 21, 2021: She reported significant stress, increased headache over the past few months, last Botox injection was more than 6 months ago due to financial concerns, and insurance issues, she would like to add on p.o. preventive medications,  UPDATE June 21 2021: Her migraine is overall under reasonable control, Botox injection every 3 months has been helpful, she reported 1 episode woke up in the middle of the last night veered to the left side, still present today,  Update September 20, 2021: Her migraine cluster around her menstruation period of time, she did have heavy menstrual bleeding, going to have the procedure performed,  She reported that Imitrex nasal spray works better than Maxalt refilled her prescription  In early November 2022, she complains of sudden onset vertigo, lasting for couple weeks, correlate with unsteady gait, increased headache.  She also complains of slow worsening hyperhidrosis of axilla region, hope to use Botox to help that.  REVIEW OF SYSTEMS: Full 14 system review of systems performed and notable only for as above All rest review of the system were negative  ALLERGIES: Allergies  Allergen Reactions   Latex Rash   Verapamil Nausea Only    Headache, dizziness    HOME MEDICATIONS: Current Outpatient Prescriptions  Medication Sig Dispense Refill   buPROPion (WELLBUTRIN SR) 150 MG 12 hr tablet Take 150 mg by mouth daily.  2   cyclobenzaprine (FLEXERIL) 5 MG tablet Take 5 mg by mouth 3 (three) times daily as needed.  0   lamoTRIgine  (LAMICTAL) 100 MG tablet 1/2 po bid xone week, then one po bid 60 tablet 11   Multiple Vitamin (MULTIVITAMIN) capsule Take 1 capsule by mouth daily.     omega-3 acid ethyl esters (LOVAZA) 1 g capsule Take 1 capsule (1 g total) by mouth daily. 30 capsule 6   oxyCODONE (OXY IR/ROXICODONE) 5 MG immediate release tablet Take 5 mg by mouth as needed.  0   SUMAtriptan (IMITREX) 20 MG/ACT nasal spray Place 1 spray (20 mg total) into the nose once. May repeat in 2 hours if headache persists or recurs. 12 Inhaler 6   No current facility-administered medications for this visit.    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 Silvano Rusk   Esophagogastroduodenoscopy  09/29/2004    Dr. Kennedy Bucker   Uterine fibroid surgery  2008   Appendectomy  07/28/2014   Laparoscopic appendectomy N/A 07/28/2014    Procedure: APPENDECTOMY LAPAROSCOPIC;  Surgeon: Autumn Messing III, MD;  Location: [place];  Service: General;   Laterality: N/A;    FAMILY HISTORY: Family History  Problem Relation Age of Onset   Heart disease Mother    Diabetes Mother    Heart disease Father    Prostate cancer Father    Heart disease     Prostate cancer     Diabetes     Colon cancer Maternal Grandmother    Alzheimer's disease Father     SOCIAL HISTORY:  Social History   Social History   Marital Status: Married    Spouse Name: N/A   Number of Children: 3   Years of Education: Copywriter, advertising History   Homemaker    Social History Main Topics   Smoking status: Never Smoker    Smokeless tobacco: Never Used   Alcohol Use: 0.6 oz/week    1 Glasses of wine per week     Comment: Rarely - less than one drink every few months   Drug Use: No   Sexual Activity: Yes   Other Topics Concern   Not on file   Social History Narrative   Lives at home with her husband and three children.   Right-handed.   Rare caffeine use.     PHYSICAL EXAM  PHYSICAL EXAMNIATION:  Gen: NAD, conversant, well nourised, well groomed              NEUROLOGICAL EXAM:  MENTAL STATUS: Speech/Cognition: Awake, alert, normal speech, oriented to history taking and casual conversation.  CRANIAL NERVES: CN II: Visual fields are full to confrontation.  Pupils are round equal and briskly reactive to light. CN III, IV, VI: extraocular movement are normal. No ptosis. CN V: Facial sensation is intact to light touch. CN VII: Face is symmetric with normal eye closure and smile. CN VIII: Hearing is normal to casual conversation CN IX, X: Palate elevates symmetrically. Phonation is normal. CN XI: Head turning and shoulder shrug are intact   MOTOR: Muscle strength is normal.   COORDINATION: There is no trunk or limb ataxia.    GAIT/STANCE: Posture is normal. Gait is steady with normal steps, base, arm swing and turning.    Epley's maneuver: Right ear dependent position first, patient complains of transient dizziness, but no  nystagmus appreciated,   ASSESSMENT AND PLAN  [name] [name] is a 45 y.o. female    Chronic migraine headaches  Tried and failed multiple different preventive medications in the past, Topamax, lamotrigine nortriptyline, Inderal, gabapentin, Lyrica, verapmil could not tolerate multiple medications due to side effect   Continue Maxalt dissolvable/sumatriptan nasal spray, plus Zofran,Aleve, Flexeril as needed for  moderate migraine  She was receiving Botox injection every 3-4 months, which has provided moderate relief of her migraine headaches, previously incidental of right ptosis following injection in January 2019, totally recovered to baseline in 4 weeks  She tried Ajovy injection in April and May 2019, reported no significant improvement, after discussed with patient, she wants to go back on Botox as migraine prevention  repeat MRI of the brain showed no acute abnormality, right side supratentorium small vessel disease consistent with long history of migraine headaches, in January 2019 from [facility], MRA of the brain showed no significant large vessel disease.  She also had a history of pituitary adenoma, status post transsphenoidal resection  She continue to report significant stress at home  Botox injection for chronic migraine prevention, injection was performed according to [place] protocol,  5 units of Botox was injected into each side, for 31 injection sites, total of 155 units  Bilateral corrugate 2 injection sites. Bilateral frontalis 4 injection sites Procerus 1 injection sites. 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 Ph.D  [facility] [address], [place], [place] [address] Ph: [phone] Fax: [phone]  CC: Sandi Mariscal

## 2021-09-20 NOTE — Progress Notes (Signed)
Botox- 100 units x  2 vials Lot: [id number] Expiration: 4935-5217-47 NDC: 1595-3967-28     B/B

## 2021-09-21 NOTE — Telephone Encounter (Signed)
Patient will see Dr. Krista Blue for Botox injections, next on 12/20/21.  Received charge sheet & signed patient consent (given to medical records) for an additional 200 units of Botox for hyperhidrosis of axilla (L74.510, CPT 19622). Patient also gets 200 units for migraine (G43.709, CPT 3165544212). Patient currently has Medicare, no prior authorization required.

## 2021-09-22 ENCOUNTER — Telehealth: Payer: Self-pay | Admitting: Orthopaedic Surgery

## 2021-09-22 NOTE — Telephone Encounter (Signed)
I called patient and had to leave voicemail. Advised that I could work her in to the schedule at 2:45pm this afternoon, but it is a work in appointment and she may have to wait. Asked that she return call if she would like to be worked in.

## 2021-09-22 NOTE — Telephone Encounter (Signed)
Pt is asking to be seen today. Pt [facility] and they just gave her pain medications. Pt  claims she cant walk. Please call pt about this matter at [phone].

## 2021-09-27 ENCOUNTER — Other Ambulatory Visit: Payer: Self-pay | Admitting: Neurology

## 2021-10-20 ENCOUNTER — Telehealth: Payer: Self-pay

## 2021-10-20 NOTE — Telephone Encounter (Signed)
° °  Pre-operative Risk Assessment    Patient Name: [name] [name]  DOB: [birth year] MRN: [id number]      Request for Surgical Clearance    Procedure:   HYST,D&C, POLYPECTOMY, POSSIBLE HTA  Date of Surgery:  Clearance 11/10/21                                 Surgeon:  Dian Queen, MD Surgeon's Group or Practice Name:  [facility] Phone number:  [phone] Fax number:  [phone]   Type of Clearance Requested:   - Medical    Type of Anesthesia:   PROPOFOL {

## 2021-10-20 NOTE — Telephone Encounter (Signed)
Pt has a NEW PT APPT with Dr. Gilman Schmidt 11/10/21 for pre op clearance. I will forward clearance notes to Dr. Gilman Schmidt for pre op appt. Will send FYI to requesting office the pt has NEW PT appt 11/10/21.

## 2021-11-10 ENCOUNTER — Other Ambulatory Visit: Payer: Self-pay

## 2021-11-10 ENCOUNTER — Encounter: Payer: Self-pay | Admitting: Cardiology

## 2021-11-10 ENCOUNTER — Ambulatory Visit (INDEPENDENT_AMBULATORY_CARE_PROVIDER_SITE_OTHER): Payer: Medicare Other | Admitting: Cardiology

## 2021-11-10 ENCOUNTER — Ambulatory Visit (INDEPENDENT_AMBULATORY_CARE_PROVIDER_SITE_OTHER): Payer: Medicare Other

## 2021-11-10 VITALS — BP 110/74 | HR 92 | Ht 66.0 in | Wt 146.4 lb

## 2021-11-10 DIAGNOSIS — Z01810 Encounter for preprocedural cardiovascular examination: Secondary | ICD-10-CM | POA: Diagnosis not present

## 2021-11-10 DIAGNOSIS — R002 Palpitations: Secondary | ICD-10-CM

## 2021-11-10 NOTE — Progress Notes (Unsigned)
Enrolled patient for a 7 day Zio XT monitor to be mailed to patients home.  

## 2021-11-10 NOTE — Patient Instructions (Signed)
Medication Instructions:  No Changes In Medications at this time.  *If you need a refill on your cardiac medications before your next appointment, please call your pharmacy*  Testing/Procedures: Your physician has requested that you have an echocardiogram. Echocardiography is a painless test that uses sound waves to create images of your heart. It provides your doctor with information about the size and shape of your heart and how well your hearts chambers and valves are working. You may receive an ultrasound enhancing agent through an IV if needed to better visualize your heart during the echo.This procedure takes approximately one hour. There are no restrictions for this procedure. This will take place at the [address], [address].    ZIO XT- Long Term Monitor Instructions   Your physician has requested you wear your ZIO patch monitor___7____days.   This is a single patch monitor.  [employer] supplies one patch monitor per enrollment.  Additional stickers are not available.   Please do not apply patch if you will be having a Nuclear Stress Test, Echocardiogram, Cardiac CT, MRI, or Chest Xray during the time frame you would be wearing the monitor. The patch cannot be worn during these tests.  You cannot remove and re-apply the ZIO XT patch monitor.   Your ZIO patch monitor will be sent USPS Priority mail from [facility] directly to your home address. The monitor may also be mailed to a PO BOX if home delivery is not available.   It may take 3-5 days to receive your monitor after you have been enrolled.   Once you have received you monitor, please review enclosed instructions.  Your monitor has already been registered assigning a specific monitor serial # to you.   Applying the monitor   Shave hair from upper left chest.   Hold abrader disc by orange tab.  Rub abrader in 40 strokes over left upper chest as indicated in your monitor instructions.   Clean area with 4 enclosed  alcohol pads .  Use all pads to assure are is cleaned thoroughly.  Let dry.   Apply patch as indicated in monitor instructions.  Patch will be place under collarbone on left side of chest with arrow pointing upward.   Rub patch adhesive wings for 2 minutes.Remove white label marked "1".  Remove white label marked "2".  Rub patch adhesive wings for 2 additional minutes.   While looking in a mirror, press and release button in center of patch.  A small green light will flash 3-4 times .  This will be your only indicator the monitor has been turned on.     Do not shower for the first 24 hours.  You may shower after the first 24 hours.   Press button if you feel a symptom. You will hear a small click.  Record Date, Time and Symptom in the Patient Log Book.   When you are ready to remove patch, follow instructions on last 2 pages of Patient Log Book.  Stick patch monitor onto last page of Patient Log Book.   Place Patient Log Book in [place] box.  Use locking tab on box and tape box closed securely.  The [employer] has [employer] on it.  Please place in mailbox as soon as possible.  Your physician should have your test results approximately 7 days after the monitor has been mailed back to [facility].   Call [facility] at [phone] if you have questions regarding your  ZIO XT patch monitor.  Call them immediately if you see an orange light blinking on your monitor.   If your monitor falls off in less than 4 days contact our Monitor department at [phone].  If your monitor becomes loose or falls off after 4 days call [employer] at [phone] for suggestions on securing your monitor.    Follow-Up: At [facility], you and your health needs are our priority.  As part of our continuing mission to provide you with exceptional heart care, we have created designated Provider Care Teams.  These Care Teams include your primary Cardiologist (physician) and Advanced  Practice Providers (APPs -  Physician Assistants and Nurse Practitioners) who all work together to provide you with the care you need, when you need it.  Your next appointment:   6 month(s)  The format for your next appointment:   In Person  Provider:   Donato Heinz, MD

## 2021-11-10 NOTE — Progress Notes (Signed)
Cardiology Office Note:    Date:  11/10/2021   ID:  [name] [name], DOB [birth year], MRN [id number]  PCP:  Patient, No Pcp Per (Inactive)  Cardiologist:  Donato Heinz, MD  Electrophysiologist:  None   Referring MD: Dian Queen, MD   Chief Complaint  Patient presents with   Palpitations    History of Present Illness:    [name] [name] is a 46 y.o. female with a hx of DVT, fibromyalgia, hiatal hernia, pituitary tumor status postresection who is referred by Dr Helane Rima for preop evaluation.  She has been having menorrhagia and planning hysteroscopy and polyp removal.  She denies any chest pain, dyspnea, lower extremity edema.  Does report having palpitations about once per week lasting for few minutes where feels like heart is racing.  Also feels short of breath during episodes.  She exercises by walking, doing yoga, and swimming.  Denies any exertional symptoms.  She walked up 2 flights of steps on her way to the appointment today without stopping, denied any exertional symptoms.  Does report some lightheadedness with standing, denies any syncope.  Reports symptoms of palpitations started after COVID-19 infection.  No smoking history.  Family history includes sister has arrhythmia, she is unsure of details, and brother has SVT and underwent ablation.    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 Silvano Rusk   ESOPHAGOGASTRODUODENOSCOPY  09/29/2004   Dr. Kennedy Bucker   LAPAROSCOPIC APPENDECTOMY N/A 07/28/2014   Procedure: APPENDECTOMY LAPAROSCOPIC;  Surgeon: Autumn Messing III, MD;  Location: [facility];  Service: General;  Laterality: N/A;   TRANSPHENOIDAL / TRANSNASAL HYPOPHYSECTOMY / RESECTION PITUITARY TUMOR  1999   resection of benign pituitary tumor, [place]  2008    Current Medications: Current Meds  Medication Sig   butalbital-acetaminophen-caffeine (FIORICET) 50-325-40 MG tablet TAKE 1 TABLET BY MOUTH EVERY 6 HOURS AS NEEDED FOR HEADACHE **NO MORE THAN 10 TABS PER MONTH**   Cholecalciferol (VITAMIN D PO) Take 5,000 Units by mouth.   hydrOXYzine (ATARAX/VISTARIL) 10 MG tablet    Multiple Vitamin (MULTIVITAMIN) capsule Take 1 capsule by mouth daily.   OnabotulinumtoxinA (BOTOX IM) Inject 200 Units into the muscle every 3 (three) months.   ondansetron (ZOFRAN ODT) 4 MG disintegrating tablet Take 1 tablet (4 mg total) by mouth every 8 (eight) hours as needed.   rizatriptan (MAXALT-MLT) 10 MG disintegrating tablet Take 1 tablet (10 mg total) by mouth as needed. May repeat in 2 hours  if needed   SUMAtriptan (IMITREX) 100 MG tablet Take 1 tab at onset of migraine.  May repeat in 2 hrs, if needed.  Max dose: 2 tabs/day. This is a 30 day prescription.     Allergies:   Latex, Eggs or egg-derived products, Acetaminophen, Metoprolol, Venlafaxine, and Verapamil   Social History   Socioeconomic History   Marital status: Unknown    Spouse name: Not on file   Number of children: 3   Years of education: College   Highest education level: Not on file  Occupational History   Occupation: Homemaker   Tobacco Use   Smoking status: Never   Smokeless tobacco: Never  Substance and Sexual Activity   Alcohol use: Yes    Alcohol/week: 1.0 standard drink    Types: 1 Glasses of wine per week    Comment: Rarely - less than one drink every few months   Drug use: No   Sexual activity: Yes  Other Topics Concern   Not on file  Social History Narrative   Lives at home with her husband and three children.   Right-handed.   Rare caffeine use.   Social Determinants of Health   Financial Resource Strain: Not on file  Food Insecurity: Not on file  Transportation Needs: Not on file  Physical Activity: Not on file  Stress: Not on file  Social Connections: Not on file     Family History: The patient's family history includes Alzheimer's disease in her father; Autism in her child; Breast cancer in her paternal aunt; Colon cancer in her maternal grandmother; Diabetes in her mother and another family member; Heart disease in her father, mother, and another family member; Prostate cancer in her father and another family member.  ROS:   Please see the history of present illness.     All other systems reviewed and are negative.  EKGs/Labs/Other Studies Reviewed:    The following studies were reviewed today:   EKG:   11/10/2021: Normal sinus rhythm, rate 91, no ST abnormalities  Recent Labs: No results found for requested labs within last 8760 hours.  Recent Lipid Panel No results found for: CHOL, TRIG, HDL, CHOLHDL, VLDL, LDLCALC, LDLDIRECT  Physical Exam:    VS:  BP 110/74    Pulse 92    Ht 5\' 6"  (1.676 m)    Wt 146 lb 6.4 oz (66.4 kg)    SpO2 98%    BMI 23.63 kg/m     Wt Readings from Last 3 Encounters:  11/10/21 146 lb 6.4 oz (66.4 kg)  09/20/21 156 lb (70.8 kg)  06/21/21 156 lb 8 oz (71 kg)     GEN:  Well nourished, well developed in no acute distress HEENT: Normal NECK: No JVD; No carotid bruits LYMPHATICS: No lymphadenopathy CARDIAC: RRR, no murmurs, rubs,  gallops RESPIRATORY:  Clear to auscultation without rales, wheezing or rhonchi  ABDOMEN: Soft, non-tender, non-distended MUSCULOSKELETAL:  No edema; No deformity  SKIN: Warm and dry NEUROLOGIC:  Alert and oriented x 3 PSYCHIATRIC:  Normal affect   ASSESSMENT:    1. Palpitations   2. Pre-operative cardiovascular examination    PLAN:    Preop evaluation: Prior to hysteroscopy and polyp removal.  Good functional capacity, greater than 4 METS, without exertional symptoms.  RCRI score 0.  Overall would classify as low risk for procedure no further cardiac work-up recommended prior to procedure.  Palpitations: Description concerning for arrhythmia, evaluate with Zio patch x7 days.  Will check echocardiogram to rule out  structural heart disease  RTC in 6 months   Medication Adjustments/Labs and Tests Ordered: Current medicines are reviewed at length with the patient today.  Concerns regarding medicines are outlined above.  Orders Placed This Encounter  Procedures   LONG TERM MONITOR (3-14 DAYS)   EKG 12-Lead   ECHOCARDIOGRAM COMPLETE   No orders of the defined types were placed in this encounter.   Patient Instructions  Medication Instructions:  No Changes In Medications at this time.  *If you need a refill on your cardiac medications before your next appointment, please call your pharmacy*  Testing/Procedures: Your physician has requested that you have an echocardiogram. Echocardiography is a painless test that uses sound waves to create images of your heart. It provides your doctor with information about the size and shape of your heart and how well your hearts chambers and valves are working. You may receive an ultrasound enhancing agent through an IV if needed to better visualize your heart during the echo.This procedure takes approximately one hour. There are no restrictions for this procedure. This will take place at the [address].    ZIO XT- Long Term  Monitor Instructions   Your physician has requested you wear your ZIO patch monitor___7____days.   This is a single patch monitor.  [employer] supplies one patch monitor per enrollment.  Additional stickers are not available.   Please do not apply patch if you will be having a Nuclear Stress Test, Echocardiogram, Cardiac CT, MRI, or Chest Xray during the time frame you would be wearing the monitor. The patch cannot be worn during these tests.  You cannot remove and re-apply the ZIO XT patch monitor.   Your ZIO patch monitor will be sent USPS Priority mail from [facility] directly to your home address. The monitor may also be mailed to a PO BOX if home delivery is not available.   It may take 3-5 days to receive your monitor after you have been enrolled.   Once you have received you monitor, please review enclosed instructions.  Your monitor has already been registered assigning a specific monitor serial # to you.   Applying the monitor   Shave hair from upper left chest.   Hold abrader disc by orange tab.  Rub abrader in 40 strokes over left upper chest as indicated in your monitor instructions.   Clean area with 4 enclosed alcohol pads .  Use all pads to assure are is cleaned thoroughly.  Let dry.   Apply patch as indicated in monitor instructions.  Patch will be place under collarbone on left side of chest with arrow pointing upward.   Rub patch adhesive wings for 2 minutes.Remove white label marked "1".  Remove white label marked "2".  Rub patch adhesive wings for 2 additional minutes.   While looking in a mirror, press and release button in center of patch.  A small green light will flash 3-4 times .  This will be your only indicator the monitor has been turned on.     Do not shower for the first 24 hours.  You may shower after the first 24 hours.   Press button if you feel a symptom. You will hear a small click.  Record Date, Time and Symptom in the Patient Log Book.   When  you are ready to remove patch, follow instructions on last 2 pages of Patient Log Book.  Stick patch monitor onto last page of Patient Log Book.  Place Patient Log Book in [place] box.  Use locking tab on box and tape box closed securely.  The [employer] has [employer] on it.  Please place in mailbox as soon as possible.  Your physician should have your test results approximately 7 days after the monitor has been mailed back to [facility].   Call [place] at [phone] if you have questions regarding your ZIO XT patch monitor.  Call them immediately if you see an orange light blinking on your monitor.   If your monitor falls off in less than 4 days contact our Monitor department at [phone].  If your monitor becomes loose or falls off after 4 days call [employer] at [phone] for suggestions on securing your monitor.    Follow-Up: At [facility], you and your health needs are our priority.  As part of our continuing mission to provide you with exceptional heart care, we have created designated Provider Care Teams.  These Care Teams include your primary Cardiologist (physician) and Advanced Practice Providers (APPs -  Physician Assistants and Nurse Practitioners) who all work together to provide you with the care you need, when you need it.  Your next appointment:   6 month(s)  The format for your next appointment:   In Person  Provider:   Donato Heinz, MD      Signed, Donato Heinz, MD  11/10/2021 5:02 PM    [place]

## 2021-11-17 ENCOUNTER — Other Ambulatory Visit (HOSPITAL_BASED_OUTPATIENT_CLINIC_OR_DEPARTMENT_OTHER): Payer: Medicare Other

## 2021-11-23 DIAGNOSIS — R002 Palpitations: Secondary | ICD-10-CM

## 2021-11-29 ENCOUNTER — Ambulatory Visit (INDEPENDENT_AMBULATORY_CARE_PROVIDER_SITE_OTHER): Payer: Medicare Other

## 2021-11-29 DIAGNOSIS — R002 Palpitations: Secondary | ICD-10-CM | POA: Diagnosis not present

## 2021-11-29 DIAGNOSIS — Z01810 Encounter for preprocedural cardiovascular examination: Secondary | ICD-10-CM

## 2021-11-29 LAB — ECHOCARDIOGRAM COMPLETE
AR max vel: 2.21 cm2
AV Area VTI: 2.18 cm2
AV Area mean vel: 2.16 cm2
AV Mean grad: 3 mmHg
AV Peak grad: 6.8 mmHg
Ao pk vel: 1.3 m/s
Area-P 1/2: 2.76 cm2
Calc EF: 67 %
S' Lateral: 2.65 cm
Single Plane A2C EF: 61.9 %
Single Plane A4C EF: 72.4 %

## 2021-12-19 ENCOUNTER — Ambulatory Visit: Payer: Medicare Other | Admitting: Neurology

## 2021-12-20 ENCOUNTER — Encounter: Payer: Self-pay | Admitting: Neurology

## 2021-12-20 ENCOUNTER — Other Ambulatory Visit: Payer: Self-pay | Admitting: Neurology

## 2021-12-20 ENCOUNTER — Ambulatory Visit (INDEPENDENT_AMBULATORY_CARE_PROVIDER_SITE_OTHER): Payer: Medicare Other | Admitting: Neurology

## 2021-12-20 VITALS — BP 119/85 | HR 94 | Ht 66.0 in | Wt 145.0 lb

## 2021-12-20 DIAGNOSIS — G43709 Chronic migraine without aura, not intractable, without status migrainosus: Secondary | ICD-10-CM

## 2021-12-20 MED ORDER — ZOLMITRIPTAN 5 MG PO TBDP: 5.0000 mg | ORAL_TABLET | ORAL | 11 refills | Status: DC | PRN

## 2021-12-20 MED ORDER — ONABOTULINUMTOXINA 100 UNITS IJ SOLR
200.0000 [IU] | Freq: Once | INTRAMUSCULAR | Status: AC
  Administered 2021-12-20: 200 [IU] via INTRAMUSCULAR

## 2021-12-20 NOTE — Progress Notes (Signed)
**  Botox 100 units x 2 vials, NDC 4492-5241-59, Lot [id number], Exp 12/2023, office supply.//mck,rn** ?

## 2021-12-20 NOTE — Progress Notes (Signed)
No chief complaint on file.      HISTORICAL  [name] [name] is a 46 years old right-handed female, seen in refer by primary care physician Dr. Nancy Fetter for evaluation of frequent headaches  I reviewed and summarized office note, she had a history of pituitary adenoma, had trans-sphenoid pituitary adenoma resection surgery at [facility] in 1999, prior to the surgery, she had breast discharge, frequent migraine headache with aura, occasionally double vision, most recent MRI of the brain in record was in 2001, there was no evidence of recurrent tumor.   She was a patient of our clinic in 2008 by Dr. Gaynell Face, for chronic migraine, she was also evaluated by [facility] in past, per record, she has tried and failed multiple preventive medications, Topamax, Depakote, nortriptyline, gabapentin, Lyrica, Inderal she has difficulty tolerating the medications  magnesium oxide, riboflavin, coenzyme Q 10 the-counter medications, Relpax, also tried dietary supplement magnesium, riboflavin, coenzyme Q 10,  For abortive treatment, she also tried and failed Maxalt, Relpax, Zomig, Frova, Imitrex tablet, she is currently taking Imitrex nasal spray as needed, it helped her about 50% of the time if she take the medicine during early onset of headaches  since her most recent pregnancy in 2014, she began to have gradual worsening migraine headaches, since 2015, she has about 5-6 migraine headaches each week, trigger for her migraines are strong smells, bright light, food additives, hungry, stress, exertion, smoking  Her typical migraine are right retro-orbital area severe pounding headache with associated light noise sensitivity, nauseous, lasting one day,  For now, she was receiving Botox injection with Dr. [name] at [facility] rehabilitation since 2014, which did helped her migraine, but has difficulty affording the bills  I have reviewed MRI of lumbar reported without contrast from [facility] February 05 2015,  multilevel degenerative disc disease, no significant foraminal or canal stenosis. Laboratory result August 30 first 2016, low normal TSH 0.7, normal CMP, with creatinine 0.8, vitamin D was 29.2, T3 total was within normal limits 85.5, normal CBC with hemoglobin of 12 point 3, normal PTH, free T4, UDS was negative  UPDATE Nov 03 2015: She has tried verapamil 40 mg twice a day for 3 weeks, complains of GI side effect, dizziness, is no longer taking it, it did not help her headache either. Insurance would not cover [place], she is now taking Imitrex nasal spray or tablet 100mg  1-2/week, oxycodone 5mg  prn (5 days a week), Flexeril 10mg  as needed for headaches,  We have reviewed MRI of the brain with and without contrast: Postoperative changes with in the sphenoid and sella turcica consistent with transsphenoidal pituitary microadenoma resection in the past. The pituitary gland has a normal signal, currently. There are several small subcortical T2/FLAIR hyperintense foci in the frontal lobes. This is a nonspecific finding but could be due to the patient's history of migraine and could also represent chronic microvascular ischemic change.   She still has headaches daily, couple times each week, her headache evolved to a more severe prolonged migraine headaches, her headache is usually  triggered by smells, muscle tightness at her neck and shoulder, hungry  UPDATE Mar 14 2016:  Patient has received Botox injection through: Rehabilitation Dr. [name] office for many years, reported mild neck extension weakness during her first injection around 2014, most recent injection was in March 2016, she received 160 units, denies significant side effect, she reported significant improvement of her migraine headaches with Botox injection  UPDATE June 21 2016: Today she came in complains  of whole body achy pain, under a lot of stress at home, reported a history of tick bite in 2002 to left thigh, with bull's eye skin  lesion, now concern of possibility of Lyme disease  I have personally reviewed MRI of the brain with and without contrast in Dec 2016,  1.   Postoperative changes with in the sphenoid and sella turcica consistent with transsphenoidal pituitary microadenoma resection in the past. The pituitary gland has a normal signal, currently. 2.   There are several small subcortical T2/FLAIR hyperintense foci in the frontal lobes. This is a nonspecific finding but could be due to the patient's history of migraine and could also represent chronic microvascular ischemic change. Demyelination is less likely. The brain is otherwise normal. 3.   There are no acute findings.  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   Postoperative changes with in the sphenoid and sella turcica consistent with transsphenoidal pituitary microadenoma resection in the past. The pituitary gland has a normal signal, currently. 2.   There are several small subcortical T2/FLAIR hyperintense foci in the frontal lobes. This is a nonspecific finding but could be due to the patient's history of  migraine and could also represent chronic microvascular ischemic change. Demyelination is less likely. The brain is otherwise normal. 3.   There are no acute findings.  We went over the possible medication treatment in detail,  UPDATE Oct 23 2017: Last injection was in April 2018, she responded very well, because of the insurance reasons, has such delay of repeat Botox injection, also complains of generalized fatigue, difficulty focusing,  UPDATE Nov 21 2017: 2 weeks after his injection on October 23, 2017, also under the extreme family stress, she woke up one day severe right-sided headache, noted right-sided droopy eyelid, intermittent blurry vision, her right side blurred vision but overall has much improved, but still has noticeable difference on examination  MRI of the brain on November 15, 2017 showed mild supratentorium small vessel disease consistent with a long history of migraine,  She complains of frequent migraine headaches, but this is in the setting of extreme family tension,  UPDATE Nov 20 2018: Following Ajovy injection in April 2019, for 2 consecutive months, it did help her migraine, her migraine become less frequent, less severe, previously Botox was very helpful, she decided to stop Botox for a while because developed droopy eyelid on 1 injection, but today, she wants to go back on Botox as preventive medication,  She is now having headache days 15 days in a month, related to her menstruation cycle, with significant light noise smells sensitivity, she is taking Fioricet, Imitrex, Flexeril as needed, she also  complains of frequent neck pain, tension,  UPDATE April 23 2019: She continue have significant headaches, contributed to the anxiety, complains of snoring, previously has the diagnosis of obstructive sleep apnea  UPDATE Sept 30 2020: She continues to have frequent headaches, Botox injection was helpful  UPDATE Mar 16 2020: Her whole family suffered COVID-19 infection at  the end of March 2021, since then she noticed increased fatigue, forgetfulness, memory loss, frequent headaches during headache, nausea has improved  She is taking Fioricet, Maxalt as needed,  UPDATE Sept 22 2021: Migraine headache overall has much improved, much less frequent, less severe, but she still deals with 1-3 migraines each week, taking Fioricet as needed, Maxalt is not covered by her insurance  Update Mar 21, 2021: She reported significant stress, increased headache over the past few months, last Botox injection was more than 6 months ago due to financial concerns, and insurance issues, she would like to add on p.o. preventive medications,  UPDATE June 21 2021: Her migraine is overall under reasonable control, Botox injection every 3 months has been helpful, she reported 1 episode woke up in the middle of the last night veered to the left side, still present today,  Update September 20, 2021: Her migraine cluster around her menstruation period of time, she did have heavy menstrual bleeding, going to have the procedure performed,  She reported that Imitrex nasal spray works better than Maxalt refilled her prescription  In early November 2022, she complains of sudden onset vertigo, lasting for couple weeks, correlate with unsteady gait, increased headache.  She also complains of slow worsening hyperhidrosis of axilla region, hope to use Botox to help that.  UPDATE December 20 2021: Under a lot of stress, continue have frequent migraine headaches, suboptimal response to Maxalt,   ASSESSMENT AND PLAN  [name] [name] is a 46 y.o. female    Chronic migraine headaches  Tried and failed multiple different preventive medications in the past, Topamax, lamotrigine nortriptyline, Inderal, gabapentin, Lyrica, verapmil could not tolerate multiple medications due to side effect  She tried Ajovy injection in April and May 2019, reported no significant improvement, after discussed with  patient, she wants to go back on Botox as migraine prevention   Continue Maxalt dissolvable/sumatriptan nasal spray (insurance no longer cover sumatriptan nasal spray) plus Zofran,Aleve, Flexeril as needed for moderate migraine, suboptimal response to Maxalt, will try Zomig dissolvable as needed   She was receiving Botox injection every 3-4 months, which has provided moderate relief of her migraine headaches, previously incidental of right ptosis following injection in January 2019, totally recovered to baseline in 4 weeks    repeat MRI of the brain showed no acute abnormality, right side supratentorium small vessel disease consistent with long history of migraine headaches, in January 2019 from [facility], MRA of the brain showed no significant large vessel disease.  She also had a history of pituitary adenoma, status post transsphenoidal resection     Botox injection for chronic migraine prevention, injection was performed according to [place] protocol,  5 units of Botox was injected into each side, for 31 injection sites, total of 155 units  Bilateral corrugate 2 injection sites. Bilateral frontalis 4 injection sites Procerus 1 injection sites. Bilateral temporalis 8 injection sites Bilateral occipitalis 6 injection sites Bilateral cervical paraspinals 4 injection sites Bilateral upper trapezius 6 injection sites  Extra 45 unites were injected into levator scapula, upper cervical, trapezius region    Marcial Pacas, M.D. Ph.D  [facility] [phone] [address]rd  [address], [place], [place] [address] Ph: [phone] Fax: [phone]  CC: Sandi Mariscal

## 2021-12-21 ENCOUNTER — Telehealth: Payer: Self-pay | Admitting: *Deleted

## 2021-12-21 NOTE — Telephone Encounter (Signed)
PA for zolmitriptan 5mg  started on covermymeds (key: [id number]). Pt has pharmacy coverage through [facility] [phone]). PA Case: [id number] approved through 10/21/2022. ?

## 2022-02-17 ENCOUNTER — Other Ambulatory Visit: Payer: Self-pay

## 2022-02-17 ENCOUNTER — Inpatient Hospital Stay (HOSPITAL_COMMUNITY)
Admission: EM | Admit: 2022-02-17 | Discharge: 2022-02-20 | DRG: 552 | Disposition: A | Discharge status: Home / Self Care | Payer: Medicare Other | Attending: Neurosurgery | Admitting: Neurosurgery

## 2022-02-17 ENCOUNTER — Encounter (HOSPITAL_COMMUNITY): Payer: Self-pay

## 2022-02-17 ENCOUNTER — Emergency Department (HOSPITAL_COMMUNITY): Payer: Medicare Other

## 2022-02-17 DIAGNOSIS — G4730 Sleep apnea, unspecified: Secondary | ICD-10-CM | POA: Diagnosis present

## 2022-02-17 DIAGNOSIS — Y92007 Garden or yard of unspecified non-institutional (private) residence as the place of occurrence of the external cause: Secondary | ICD-10-CM

## 2022-02-17 DIAGNOSIS — Z803 Family history of malignant neoplasm of breast: Secondary | ICD-10-CM

## 2022-02-17 DIAGNOSIS — G540 Brachial plexus disorders: Secondary | ICD-10-CM | POA: Diagnosis present

## 2022-02-17 DIAGNOSIS — Z833 Family history of diabetes mellitus: Secondary | ICD-10-CM

## 2022-02-17 DIAGNOSIS — Z20822 Contact with and (suspected) exposure to covid-19: Secondary | ICD-10-CM | POA: Diagnosis present

## 2022-02-17 DIAGNOSIS — S199XXA Unspecified injury of neck, initial encounter: Secondary | ICD-10-CM | POA: Diagnosis present

## 2022-02-17 DIAGNOSIS — Z800 Family history of malignant neoplasm of digestive organs: Secondary | ICD-10-CM

## 2022-02-17 DIAGNOSIS — M797 Fibromyalgia: Secondary | ICD-10-CM | POA: Diagnosis present

## 2022-02-17 DIAGNOSIS — S161XXA Strain of muscle, fascia and tendon at neck level, initial encounter: Secondary | ICD-10-CM | POA: Diagnosis not present

## 2022-02-17 DIAGNOSIS — F32A Depression, unspecified: Secondary | ICD-10-CM | POA: Diagnosis present

## 2022-02-17 DIAGNOSIS — K219 Gastro-esophageal reflux disease without esophagitis: Secondary | ICD-10-CM | POA: Diagnosis present

## 2022-02-17 DIAGNOSIS — F419 Anxiety disorder, unspecified: Secondary | ICD-10-CM | POA: Diagnosis present

## 2022-02-17 DIAGNOSIS — F0781 Postconcussional syndrome: Secondary | ICD-10-CM | POA: Diagnosis present

## 2022-02-17 DIAGNOSIS — W51XXXA Accidental striking against or bumped into by another person, initial encounter: Secondary | ICD-10-CM | POA: Diagnosis present

## 2022-02-17 DIAGNOSIS — M542 Cervicalgia: Principal | ICD-10-CM

## 2022-02-17 DIAGNOSIS — K589 Irritable bowel syndrome without diarrhea: Secondary | ICD-10-CM | POA: Diagnosis present

## 2022-02-17 DIAGNOSIS — Z8249 Family history of ischemic heart disease and other diseases of the circulatory system: Secondary | ICD-10-CM

## 2022-02-17 DIAGNOSIS — Z9049 Acquired absence of other specified parts of digestive tract: Secondary | ICD-10-CM

## 2022-02-17 DIAGNOSIS — Z86718 Personal history of other venous thrombosis and embolism: Secondary | ICD-10-CM

## 2022-02-17 LAB — URINALYSIS, ROUTINE W REFLEX MICROSCOPIC
Bilirubin Urine: NEGATIVE
Glucose, UA: NEGATIVE mg/dL
Hgb urine dipstick: NEGATIVE
Ketones, ur: NEGATIVE mg/dL
Nitrite: NEGATIVE
Protein, ur: NEGATIVE mg/dL
Specific Gravity, Urine: 1.014 (ref 1.005–1.030)
pH: 8 (ref 5.0–8.0)

## 2022-02-17 LAB — I-STAT CHEM 8, ED
BUN: 7 mg/dL (ref 6–20)
Calcium, Ion: 1.16 mmol/L (ref 1.15–1.40)
Chloride: 105 mmol/L (ref 98–111)
Creatinine, Ser: 0.6 mg/dL (ref 0.44–1.00)
Glucose, Bld: 91 mg/dL (ref 70–99)
HCT: 36 % (ref 36.0–46.0)
Hemoglobin: 12.2 g/dL (ref 12.0–15.0)
Potassium: 3.6 mmol/L (ref 3.5–5.1)
Sodium: 139 mmol/L (ref 135–145)
TCO2: 23 mmol/L (ref 22–32)

## 2022-02-17 LAB — CBC
HCT: 36.3 % (ref 36.0–46.0)
Hemoglobin: 11.7 g/dL — ABNORMAL LOW (ref 12.0–15.0)
MCH: 28.5 pg (ref 26.0–34.0)
MCHC: 32.2 g/dL (ref 30.0–36.0)
MCV: 88.5 fL (ref 80.0–100.0)
Platelets: 305 10*3/uL (ref 150–400)
RBC: 4.1 MIL/uL (ref 3.87–5.11)
RDW: 13.6 % (ref 11.5–15.5)
WBC: 7.6 10*3/uL (ref 4.0–10.5)
nRBC: 0 % (ref 0.0–0.2)

## 2022-02-17 LAB — COMPREHENSIVE METABOLIC PANEL
ALT: 15 U/L (ref 0–44)
AST: 22 U/L (ref 15–41)
Albumin: 4.1 g/dL (ref 3.5–5.0)
Alkaline Phosphatase: 36 U/L — ABNORMAL LOW (ref 38–126)
Anion gap: 7 (ref 5–15)
BUN: 7 mg/dL (ref 6–20)
CO2: 24 mmol/L (ref 22–32)
Calcium: 9.3 mg/dL (ref 8.9–10.3)
Chloride: 109 mmol/L (ref 98–111)
Creatinine, Ser: 0.61 mg/dL (ref 0.44–1.00)
GFR, Estimated: >60 mL/min (ref >60)
Glucose, Bld: 90 mg/dL (ref 70–99)
Potassium: 3.7 mmol/L (ref 3.5–5.1)
Sodium: 140 mmol/L (ref 135–145)
Total Bilirubin: 0.6 mg/dL (ref 0.3–1.2)
Total Protein: 6.7 g/dL (ref 6.5–8.1)

## 2022-02-17 LAB — ETHANOL: Alcohol, Ethyl (B): <10 mg/dL (ref <10)

## 2022-02-17 LAB — I-STAT BETA HCG BLOOD, ED (MC, WL, AP ONLY): I-stat hCG, quantitative: <5 m[IU]/mL (ref <5)

## 2022-02-17 LAB — LACTIC ACID, PLASMA: Lactic Acid, Venous: 0.9 mmol/L (ref 0.5–1.9)

## 2022-02-17 LAB — PROTIME-INR
INR: 1.1 (ref 0.8–1.2)
Prothrombin Time: 14.1 seconds (ref 11.4–15.2)

## 2022-02-17 MED ORDER — HYDROMORPHONE HCL 1 MG/ML IJ SOLN
0.5000 mg | Freq: Once | INTRAMUSCULAR | Status: AC
  Administered 2022-02-17: 0.5 mg via INTRAVENOUS
  Filled 2022-02-17: qty 1

## 2022-02-17 MED ORDER — SODIUM CHLORIDE 0.9% FLUSH
3.0000 mL | Freq: Two times a day (BID) | INTRAVENOUS | Status: DC
  Administered 2022-02-18 – 2022-02-19 (×3): 3 mL via INTRAVENOUS

## 2022-02-17 MED ORDER — LORAZEPAM 2 MG/ML IJ SOLN
0.5000 mg | Freq: Once | INTRAMUSCULAR | Status: AC
  Administered 2022-02-17: 0.5 mg via INTRAVENOUS
  Filled 2022-02-17: qty 1

## 2022-02-17 MED ORDER — SODIUM CHLORIDE 0.9% FLUSH: 3.0000 mL | INTRAVENOUS | Status: DC | PRN

## 2022-02-17 MED ORDER — SODIUM CHLORIDE 0.9 % IV SOLN
250.0000 mL | INTRAVENOUS | Status: DC | PRN
Start: 2022-02-17 — End: 2022-02-20

## 2022-02-17 MED ORDER — GADOBUTROL 1 MMOL/ML IV SOLN
6.5000 mL | Freq: Once | INTRAVENOUS | Status: AC | PRN
  Administered 2022-02-17: 6.5 mL via INTRAVENOUS

## 2022-02-17 MED ORDER — FENTANYL CITRATE PF 50 MCG/ML IJ SOSY
50.0000 ug | PREFILLED_SYRINGE | Freq: Once | INTRAMUSCULAR | Status: AC
  Administered 2022-02-17: 50 ug via INTRAVENOUS
  Filled 2022-02-17: qty 1

## 2022-02-17 NOTE — ED Notes (Signed)
Pt provided applesauce and ginger rale at this time. While doing assessment pt c/o pain with RLQ abd pain with palpation, weak on the lt side with the hand grip. C/o pain in the neck and the head at this time as well ?

## 2022-02-17 NOTE — Progress Notes (Signed)
Chaplain responded to Trauma L2, trampoline accident.  Pt was in CT, no family present.  Please call as needed for support.  ? ?Minus Liberty, Chaplain ?Pager:  [phone] ? ? ? ? 02/17/22 1600  ?Clinical Encounter Type  ?Visited With Patient not available  ?Visit Type Initial  ?Referral From Physician  ?Stress Factors  ?Patient Stress Factors Health changes  ? ? ?

## 2022-02-17 NOTE — Progress Notes (Signed)
Orthopedic Tech Progress Note ?Patient Details:  ?[name] [name] ?[birth year] ?[id number] ? ?Level 2 trauma ? ?Patient ID: [name] [name], female   DOB: 1975/10/29, 46 y.o.   MRN: [id number] ? ?[name] [name] ?02/17/2022, 4:06 PM ? ?

## 2022-02-17 NOTE — ED Triage Notes (Signed)
Pt BIB [facility] from home after a 100lb child jumped on neck while patient was trying to stand underneath trampoline. Pt A&Ox4. BP 124/84 HR 72 ?

## 2022-02-17 NOTE — ED Provider Notes (Signed)
?[facility] ?Provider Note ? ? ?CSN: [id number] ?Arrival date & time: 02/17/22  1536 ? ?  ? ?History ? ?Chief Complaint  ?Patient presents with  ? Neck Injury  ? ? ?[name] [name] is a 46 y.o. female. ? ? ?Neck Injury ? ? ?46 year old female, she has a history of migraine headaches taking Fioricet, no other significant daily medications.  She presents to the hospital after suffering a neck injury.  Reportedly the patient was underneath a trampoline, when she went to get up and get out from underneath the trampoline one of the children jumping on the trampoline came down directly on her head and neck causing her head to go towards the side.  She immediately felt pain from her neck all the way through her sacrum and now cannot move either of her arms or legs.  She is transported by EMS immediately.  No trauma was activated prehospital.  She was placed in a cervical collar, no meds were given ? ?Home Medications ?Prior to Admission medications   ?Medication Sig Start Date End Date Taking? Authorizing Provider  ?butalbital-acetaminophen-caffeine (FIORICET) 50-325-40 MG tablet TAKE 1 TABLET BY MOUTH EVERY 6 HOURS AS NEEDED FOR HEADACHE **NO MORE THAN 10 TABS PER MONTH** 09/07/21   Marcial Pacas, MD  ?Cholecalciferol (VITAMIN D PO) Take 5,000 Units by mouth.    [provider]  ?hydrOXYzine (ATARAX/VISTARIL) 10 MG tablet  08/26/18   [provider]  ?Multiple Vitamin (MULTIVITAMIN) capsule Take 1 capsule by mouth daily.    [provider]  ?OnabotulinumtoxinA (BOTOX IM) Inject 200 Units into the muscle every 3 (three) months.    [provider]  ?ondansetron (ZOFRAN ODT) 4 MG disintegrating tablet Take 1 tablet (4 mg total) by mouth every 8 (eight) hours as needed. 11/20/18   Marcial Pacas, MD  ?rizatriptan (MAXALT-MLT) 10 MG disintegrating tablet Take 1 tablet (10 mg total) by mouth as needed. May repeat in 2 hours if needed 03/21/21   Marcial Pacas, MD   ?zolmitriptan (ZOMIG ZMT) 5 MG disintegrating tablet Take 1 tablet (5 mg total) by mouth as needed for migraine. 12/20/21   Marcial Pacas, MD  ?   ? ?Allergies    ?Latex, Eggs or egg-derived products, Acetaminophen, Metoprolol, Venlafaxine, and Verapamil   ? ?Review of Systems   ?Review of Systems  ?All other systems reviewed and are negative. ? ?Physical Exam ?Updated Vital Signs ?SpO2 99%  ?Physical Exam ?Vitals and nursing note reviewed.  ?Constitutional:   ?   General: She is not in acute distress. ?   Appearance: She is well-developed.  ?HENT:  ?   Head: Normocephalic and atraumatic.  ?   Mouth/Throat:  ?   Pharynx: No oropharyngeal exudate.  ?Eyes:  ?   General: No scleral icterus.    ?   Right eye: No discharge.     ?   Left eye: No discharge.  ?   Conjunctiva/sclera: Conjunctivae normal.  ?   Pupils: Pupils are equal, round, and reactive to light.  ?Neck:  ?   Thyroid: No thyromegaly.  ?   Vascular: No JVD.  ?Cardiovascular:  ?   Rate and Rhythm: Normal rate and regular rhythm.  ?   Heart sounds: Normal heart sounds. No murmur heard. ?  No friction rub. No gallop.  ?Pulmonary:  ?   Effort: Pulmonary effort is normal. No respiratory distress.  ?   Breath sounds: Normal breath sounds. No wheezing or rales.  ?Abdominal:  ?  General: Bowel sounds are normal. There is no distension.  ?   Palpations: Abdomen is soft. There is no mass.  ?   Tenderness: There is no abdominal tenderness.  ?Musculoskeletal:     ?   General: Normal range of motion.  ?   Cervical back: Tenderness present.  ?Lymphadenopathy:  ?   Cervical: No cervical adenopathy.  ?Skin: ?   General: Skin is warm and dry.  ?   Findings: No erythema or rash.  ?Neurological:  ?   Mental Status: She is alert.  ?   Coordination: Coordination normal.  ?   Comments: The patient is tearful, she is able to move her right arm with some strength, is around 3 out of 5.  She is able to move her right leg at about 3 out of 5, left leg is 4 out of 5, left arm has  occasional movements of the hand but cannot hold it in the air against gravity whatsoever.  She cannot move it without the absence of gravity either.  She has decreased sensation on the left both pinprick and light touch.  She has extension to the left but both sides are touched, cranial nerves III through XII appear normal  ?Psychiatric:     ?   Behavior: Behavior normal.  ? ? ?ED Results / Procedures / Treatments   ?Labs ?(all labs ordered are listed, but only abnormal results are displayed) ?Labs Reviewed - No data to display ? ?EKG ?None ? ?Radiology ?No results found. ? ?Procedures ?Procedures  ? ? ?Medications Ordered in ED ?Medications  ?sodium chloride flush (NS) 0.9 % injection 3 mL (has no administration in time range)  ?sodium chloride flush (NS) 0.9 % injection 3 mL (has no administration in time range)  ?0.9 %  sodium chloride infusion (has no administration in time range)  ?fentaNYL (SUBLIMAZE) injection 50 mcg (has no administration in time range)  ? ? ?ED Course/ Medical Decision Making/ A&P ?Clinical Course as of 02/17/22 2335  ?Sat Feb 17, 2022  ?1656 Case was discussed with neurosurgery, Maudie Mercury, she recommends the patient go for MRI then follow-up. [BM]  ?  ?Clinical Course User Index ?[BM] Noemi Chapel, MD  ? ?                        ?Medical Decision Making ?Amount and/or Complexity of Data Reviewed ?Labs: ordered. ?Radiology: ordered. ? ?Risk ?Prescription drug management. ? ? ?High concern for spinal cord injury, the patient will go immediately to CT scan to look for spinal cord injury with CT scan, may need MRI surgical consult, replaced prehospital cervical collar with more appropriate [place] J.  Fentanyl ordered.  Patient appears significantly injured with spinal cord injury most likely ? ?CT scans were unremarkable ? ?MRI of the cervical spine was also unremarkable ? ?The patient is gradually improving, she has been reevaluated multiple times and has no worsening of her symptoms and gradual  improvement of her symptoms.  Dr. Saintclair Halsted with neurosurgery has seen the patient and requested an MRI of the brachial plexus.  This will not unfortunately be able to be done until the morning given the timing of the contrast. ? ?At the time of change of shift care will be signed out to oncoming emergency department physician but this will not be disposition till after next morning due to timing of MRI.  The patient is aware of the plan she is agreeable, she is gradually improving, she  has normal vital signs.   ? ? ? ? ? ? ? ?Final Clinical Impression(s) / ED Diagnoses ?Final diagnoses:  ?None  ? ? ?Rx / DC Orders ?ED Discharge Orders   ? ? None  ? ?  ? ? ?  ?Noemi Chapel, MD ?02/17/22 2336 ? ?

## 2022-02-17 NOTE — ED Notes (Signed)
Pt refused covid test at this time. Provider made aware ?

## 2022-02-17 NOTE — ED Notes (Signed)
Pt given ice pack per request. 

## 2022-02-17 NOTE — ED Notes (Signed)
Received verbal report from Paden B RN at this time 

## 2022-02-17 NOTE — ED Notes (Signed)
Trauma Response Nurse Documentation ? ? ?[name] [name] is a 46 y.o. female arriving to [facility] ED via EMS ? ?Trauma was activated as a Level 2 by MD after arrival based on the following trauma criteria Paralysis associated with trauma,. Trauma team at the bedside on patient arrival. Patient cleared for CT by Dr. Sabra Heck. Patient to CT with team. GCS 15. ? ?Per patient was under a trampoline when her daughter jumped on the top and came down on her head/left side of her neck. She had immediate paralysis, landing on her face. She has had improvement and continues to have left UE weakeness and decreased sensation. She is in a c-collar which was replaced with a [place] J on arrival. ? ?History  ? Past Medical History:  ?Diagnosis Date  ? Adenomatous colon polyp 2008  ? Anxiety 1999/2000  ? Arthritis   ? "jaw joints; neck" (07/28/2014)  ? Complication of anesthesia   ? "didn't wake up well/remembered stuff from during OR when I had pituitary surgery"  ? Constipation   ? Depression   ? hx  ? DVT (deep vein thrombosis) in pregnancy 09/2013  ? "LLE; postpartum"  ? Fibromyalgia   ? GERD (gastroesophageal reflux disease)   ? H/O hiatal hernia   ? Hematochezia   ? IBS (irritable bowel syndrome)   ? Internal hemorrhoids   ? Migraines   ? "couple times/wk" (07/28/2014)  ? 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 Panic disorder 1999/2000  ? 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 Sciatica   ? Seasonal allergies   ? Sleep apnea   ? "don't currently use CPAP" (07/28/2014)  ?  ? Past Surgical History:  ?Procedure Laterality Date  ? APPENDECTOMY  07/28/2014  ? COLONOSCOPY  01/03/2007  ? Dr. Silvano Rusk  ? ESOPHAGOGASTRODUODENOSCOPY  09/29/2004  ? Dr. Kennedy Bucker  ? LAPAROSCOPIC APPENDECTOMY N/A 07/28/2014  ? Procedure: APPENDECTOMY LAPAROSCOPIC;  Surgeon: Autumn Messing III, MD;  Location: [facility];  Service: General;  Laterality: N/A;  ? TRANSPHENOIDAL / TRANSNASAL HYPOPHYSECTOMY / RESECTION PITUITARY TUMOR  1999  ? resection of  benign pituitary tumor, [facility]  ? UTERINE FIBROID SURGERY  2008  ?  ? ? ?Initial Focused Assessment (If applicable, or please see trauma documentation): ?A&Ox4, GCS 15 ?Weakness in left UE, decreased sensation. Has improved since initial injury. ? ?CT's Completed:   ?CT Head and CT C-Spine  ? ?Interventions:  ?IV, trauma labs ?CT Head/Cspine ? ?Consults completed:  ?Neurosurgeon consulted and recommends MRI cspine due to negative CT imaging. ? ?Bedside handoff with ED RN Paden.   ? ?Park Pope Xsavier Seeley  ?Trauma Response RN ? ?Please call TRN at [phone] for further assistance. ?  ?

## 2022-02-17 NOTE — ED Notes (Signed)
Patient transported to MRI 

## 2022-02-17 NOTE — Consult Note (Signed)
Reason for Consult: Neck pain weakness ?Referring Physician: Emergency department ? ?[name] [name] is an 46 y.o. female.  ?HPI: 46 year old female who was under a trampoline while with her daughter jumping on the trampoline before and the daughter jumped as she was trying to get out landing on top of her head neck mostly on the left side of her head left side of her neck immediately she landed face down and felt like she could not move arms and legs both sides.  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  Currently patient does feel like she is getting some better she complains of neck pain and shoulder pain some numbness its improved in the fingers on the right arm still left arm appears to be numb she has normal sensation in her legs ? ?Past Medical History:  ?Diagnosis Date  ? Adenomatous colon polyp 2008  ? Anxiety 1999/2000  ? Arthritis   ? "jaw joints; neck" (07/28/2014)  ? Complication of anesthesia   ? "didn't wake up well/remembered stuff from during OR when I had pituitary surgery"  ? Constipation   ? Depression   ? hx  ? DVT (deep vein thrombosis) in pregnancy 09/2013  ? "LLE; postpartum"  ? Fibromyalgia   ? GERD (gastroesophageal reflux disease)   ? H/O hiatal hernia   ? Hematochezia   ? IBS (irritable bowel syndrome)   ? Internal hemorrhoids   ? Migraines   ? "couple times/wk" (07/28/2014)  ? 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 Panic disorder 1999/2000  ? 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 Sciatica   ? Seasonal allergies   ? Sleep apnea   ? "don't currently use CPAP" (07/28/2014)  ? ? ?Past Surgical History:  ?Procedure Laterality Date  ? APPENDECTOMY  07/28/2014  ? COLONOSCOPY  01/03/2007  ? Dr. Silvano Rusk  ? ESOPHAGOGASTRODUODENOSCOPY  09/29/2004  ? Dr. Kennedy Bucker  ? LAPAROSCOPIC APPENDECTOMY N/A 07/28/2014  ? Procedure: APPENDECTOMY LAPAROSCOPIC;  Surgeon: Autumn Messing III, MD;  Location: [facility];  Service: General;   Laterality: N/A;  ? TRANSPHENOIDAL / TRANSNASAL HYPOPHYSECTOMY / RESECTION PITUITARY TUMOR  1999  ? resection of benign pituitary tumor, [facility]  ? UTERINE FIBROID SURGERY  2008  ? ? ?Family History  ?Problem Relation Age of Onset  ? Heart disease Mother   ? Diabetes Mother   ? Heart disease Father   ? Prostate cancer Father   ? Alzheimer's disease Father   ? Colon cancer Maternal Grandmother   ? Heart disease Other   ? Prostate cancer Other   ? Diabetes Other   ? Autism Child   ?     2 of 3 daughters have high functioning Autism  ? Breast cancer Paternal Aunt   ? ? ?Social History:  reports that she has never smoked. She has never used smokeless tobacco. She reports current alcohol use of about 1.0 standard drink per week. She reports that she does not use drugs. ? ?Allergies:  ?Allergies  ?Allergen Reactions  ? Latex Rash  ? Eggs Or Egg-Derived Products Nausea And Vomiting  ?  She cannot have flu vaccination.  ? Acetaminophen Other (See Comments)  ?  Itchy, hives, rash, stomach pain, nausea  ? Metoprolol Other (See Comments)  ?  dizziness  ? Venlafaxine   ?  Reports one time seizure on this medication (occurred 20+ years ago)  ? Verapamil Nausea Only  ?  Headache, dizziness  ? ? ?Medications: I have reviewed the patient's current medications. ? ?Results for orders placed or performed during the hospital encounter of 02/17/22 (from the past 48 hour(s))  ?Comprehensive metabolic panel     Status: Abnormal  ? Collection Time: 02/17/22  3:46 PM  ?Result Value Ref Range  ? Sodium 140 135 - 145 mmol/L  ? Potassium 3.7 3.5 - 5.1 mmol/L  ? Chloride 109 98 - 111 mmol/L  ? CO2 24 22 - 32 mmol/L  ? Glucose, Bld 90 70 - 99 mg/dL  ?  Comment: Glucose reference range applies only to samples taken after fasting for at least 8 hours.  ? BUN 7 6 - 20 mg/dL  ? Creatinine, Ser 0.61 0.44 - 1.00 mg/dL  ? Calcium 9.3 8.9 - 10.3 mg/dL  ? Total Protein 6.7 6.5 - 8.1 g/dL  ? Albumin 4.1 3.5 - 5.0 g/dL  ? AST 22 15 - 41 U/L  ?  ALT 15 0 - 44 U/L  ? Alkaline Phosphatase 36 (L) 38 - 126 U/L  ? Total Bilirubin 0.6 0.3 - 1.2 mg/dL  ? GFR, Estimated >60 >60 mL/min  ?  Comment: (NOTE) ?Calculated using the CKD-EPI Creatinine Equation (2021) ?  ? Anion gap 7 5 - 15  ?  Comment: Performed at [facility], [place] [address], [place], [place] [address]  ?CBC     Status: Abnormal  ? Collection Time: 02/17/22  3:46 PM  ?Result Value Ref Range  ? WBC 7.6 4.0 - 10.5 K/uL  ? RBC 4.10 3.87 - 5.11 MIL/uL  ? Hemoglobin 11.7 (L) 12.0 - 15.0 g/dL  ? HCT 36.3 36.0 - 46.0 %  ? MCV 88.5 80.0 - 100.0 fL  ? MCH 28.5 26.0 - 34.0 pg  ? MCHC 32.2 30.0 - 36.0 g/dL  ? RDW 13.6 11.5 - 15.5 %  ? Platelets 305 150 - 400 K/uL  ? nRBC 0.0 0.0 - 0.2 %  ?  Comment: Performed at [facility], [place] [address] [place], [place] [address]  ?Ethanol     Status: None  ? Collection Time: 02/17/22  3:46 PM  ?Result Value Ref Range  ? Alcohol, Ethyl (B) <10 <10 mg/dL  ?  Comment: (NOTE) ?Lowest detectable limit for serum alcohol is 10 mg/dL. ? ?For medical purposes only. ?Performed at [facility], [place] [address] [place], [place] ?[address] ?  ?Urinalysis, Routine w reflex microscopic Urine, Clean Catch     Status: Abnormal  ? Collection Time: 02/17/22  3:46 PM  ?Result Value Ref Range  ? Color, Urine YELLOW YELLOW  ? APPearance HAZY (A) CLEAR  ? Specific Gravity, Urine 1.014 1.005 - 1.030  ? pH 8.0 5.0 - 8.0  ? Glucose, UA NEGATIVE NEGATIVE mg/dL  ? Hgb urine dipstick NEGATIVE NEGATIVE  ? Bilirubin Urine NEGATIVE NEGATIVE  ? Ketones, ur NEGATIVE NEGATIVE mg/dL  ? Protein, ur NEGATIVE NEGATIVE mg/dL  ? Nitrite NEGATIVE NEGATIVE  ? Leukocytes,Ua TRACE (A) NEGATIVE  ? RBC / HPF 0-5 0 - 5 RBC/hpf  ? WBC, UA 0-5 0 - 5 WBC/hpf  ? Bacteria, UA RARE (A) NONE SEEN  ? Squamous Epithelial / LPF 6-10 0 - 5  ?  Comment: Performed at [facility], [place] [address], [place], [place] [address]  ?Lactic acid, plasma     Status: None  ? Collection Time: 02/17/22  3:46 PM  ?Result  Value Ref Range  ? Lactic  Acid, Venous 0.9 0.5 - 1.9 mmol/L  ?  Comment: Performed at [facility], [place] [address] [place], [place] [address]  ?Protime-INR     Status: None  ? Collection Time: 02/17/22  3:46 PM  ?Result Value Ref Range  ? Prothrombin Time 14.1 11.4 - 15.2 seconds  ? INR 1.1 0.8 - 1.2  ?  Comment: (NOTE) ?INR goal varies based on device and disease states. ?Performed at [facility], [place] [address] [place], [place] ?[address] ?  ?I-Stat beta hCG blood, ED     Status: None  ? Collection Time: 02/17/22  3:58 PM  ?Result Value Ref Range  ? I-stat hCG, quantitative <5.0 <5 mIU/mL  ? Comment 3          ?  Comment:   GEST. AGE      CONC.  (mIU/mL) ?  <=1 WEEK        5 - 50 ?    2 WEEKS       50 - 500 ?    3 WEEKS       100 - 10,000 ?    4 WEEKS     1,000 - 30,000 ?       ?FEMALE AND NON-PREGNANT FEMALE: ?    LESS THAN 5 mIU/mL ?  ?I-Stat Chem 8, ED     Status: None  ? Collection Time: 02/17/22  4:01 PM  ?Result Value Ref Range  ? Sodium 139 135 - 145 mmol/L  ? Potassium 3.6 3.5 - 5.1 mmol/L  ? Chloride 105 98 - 111 mmol/L  ? BUN 7 6 - 20 mg/dL  ? Creatinine, Ser 0.60 0.44 - 1.00 mg/dL  ? Glucose, Bld 91 70 - 99 mg/dL  ?  Comment: Glucose reference range applies only to samples taken after fasting for at least 8 hours.  ? Calcium, Ion 1.16 1.15 - 1.40 mmol/L  ? TCO2 23 22 - 32 mmol/L  ? Hemoglobin 12.2 12.0 - 15.0 g/dL  ? HCT 36.0 36.0 - 46.0 %  ? ? ?CT HEAD WO CONTRAST ? ?Result Date: 02/17/2022 ?CLINICAL DATA:  Choose 1 EXAM: CT HEAD WITHOUT CONTRAST CT CERVICAL SPINE WITHOUT CONTRAST TECHNIQUE: Multidetector CT imaging of the head and cervical spine was performed following the standard protocol without intravenous contrast. Multiplanar CT image reconstructions of the cervical spine were also generated. RADIATION DOSE REDUCTION: This exam was performed according to the departmental dose-optimization program which includes automated exposure control, adjustment of the mA and/or kV according to  patient size and/or use of iterative reconstruction technique. COMPARISON:  10/18/2015 FINDINGS: CT HEAD FINDINGS Brain: No evidence of acute infarction, hemorrhage, hydrocephalus, extra-axial collection o

## 2022-02-17 NOTE — ED Notes (Signed)
Neuro surgery at bedside.

## 2022-02-18 ENCOUNTER — Encounter (HOSPITAL_COMMUNITY): Payer: Self-pay | Admitting: Emergency Medicine

## 2022-02-18 ENCOUNTER — Inpatient Hospital Stay (HOSPITAL_COMMUNITY): Payer: Medicare Other

## 2022-02-18 ENCOUNTER — Emergency Department (HOSPITAL_COMMUNITY): Payer: Medicare Other

## 2022-02-18 DIAGNOSIS — M542 Cervicalgia: Secondary | ICD-10-CM | POA: Diagnosis present

## 2022-02-18 DIAGNOSIS — Z20822 Contact with and (suspected) exposure to covid-19: Secondary | ICD-10-CM | POA: Diagnosis present

## 2022-02-18 DIAGNOSIS — M797 Fibromyalgia: Secondary | ICD-10-CM | POA: Diagnosis present

## 2022-02-18 DIAGNOSIS — Y92007 Garden or yard of unspecified non-institutional (private) residence as the place of occurrence of the external cause: Secondary | ICD-10-CM | POA: Diagnosis not present

## 2022-02-18 DIAGNOSIS — Z800 Family history of malignant neoplasm of digestive organs: Secondary | ICD-10-CM | POA: Diagnosis not present

## 2022-02-18 DIAGNOSIS — Z803 Family history of malignant neoplasm of breast: Secondary | ICD-10-CM | POA: Diagnosis not present

## 2022-02-18 DIAGNOSIS — W51XXXA Accidental striking against or bumped into by another person, initial encounter: Secondary | ICD-10-CM | POA: Diagnosis present

## 2022-02-18 DIAGNOSIS — S199XXA Unspecified injury of neck, initial encounter: Secondary | ICD-10-CM | POA: Diagnosis present

## 2022-02-18 DIAGNOSIS — Z8249 Family history of ischemic heart disease and other diseases of the circulatory system: Secondary | ICD-10-CM | POA: Diagnosis not present

## 2022-02-18 DIAGNOSIS — K219 Gastro-esophageal reflux disease without esophagitis: Secondary | ICD-10-CM | POA: Diagnosis present

## 2022-02-18 DIAGNOSIS — G4730 Sleep apnea, unspecified: Secondary | ICD-10-CM | POA: Diagnosis present

## 2022-02-18 DIAGNOSIS — Z833 Family history of diabetes mellitus: Secondary | ICD-10-CM | POA: Diagnosis not present

## 2022-02-18 DIAGNOSIS — F0781 Postconcussional syndrome: Secondary | ICD-10-CM | POA: Diagnosis present

## 2022-02-18 DIAGNOSIS — G540 Brachial plexus disorders: Secondary | ICD-10-CM | POA: Diagnosis present

## 2022-02-18 DIAGNOSIS — Z9049 Acquired absence of other specified parts of digestive tract: Secondary | ICD-10-CM | POA: Diagnosis not present

## 2022-02-18 DIAGNOSIS — F32A Depression, unspecified: Secondary | ICD-10-CM | POA: Diagnosis present

## 2022-02-18 DIAGNOSIS — F419 Anxiety disorder, unspecified: Secondary | ICD-10-CM | POA: Diagnosis present

## 2022-02-18 DIAGNOSIS — K589 Irritable bowel syndrome without diarrhea: Secondary | ICD-10-CM | POA: Diagnosis present

## 2022-02-18 DIAGNOSIS — S161XXA Strain of muscle, fascia and tendon at neck level, initial encounter: Secondary | ICD-10-CM | POA: Diagnosis present

## 2022-02-18 DIAGNOSIS — Z86718 Personal history of other venous thrombosis and embolism: Secondary | ICD-10-CM | POA: Diagnosis not present

## 2022-02-18 LAB — RESP PANEL BY RT-PCR (FLU A&B, COVID) ARPGX2
Influenza A by PCR: NEGATIVE
Influenza B by PCR: NEGATIVE
SARS Coronavirus 2 by RT PCR: NEGATIVE

## 2022-02-18 MED ORDER — CYCLOBENZAPRINE HCL 10 MG PO TABS
10.0000 mg | ORAL_TABLET | Freq: Three times a day (TID) | ORAL | Status: DC | PRN
  Administered 2022-02-18: 10 mg via ORAL
  Filled 2022-02-18: qty 1

## 2022-02-18 MED ORDER — ONDANSETRON HCL 4 MG/2ML IJ SOLN: 4.0000 mg | Freq: Four times a day (QID) | INTRAMUSCULAR | Status: DC | PRN

## 2022-02-18 MED ORDER — ACETAMINOPHEN 650 MG RE SUPP: 650.0000 mg | RECTAL | Status: DC | PRN

## 2022-02-18 MED ORDER — AMPHETAMINE-DEXTROAMPHETAMINE 10 MG PO TABS: 10.0000 mg | ORAL_TABLET | Freq: Every day | ORAL | Status: DC

## 2022-02-18 MED ORDER — MENTHOL 3 MG MT LOZG: 1.0000 | LOZENGE | OROMUCOSAL | Status: DC | PRN

## 2022-02-18 MED ORDER — AMPHETAMINE-DEXTROAMPHETAMINE 10 MG PO TABS
20.0000 mg | ORAL_TABLET | Freq: Two times a day (BID) | ORAL | Status: DC
  Administered 2022-02-18 – 2022-02-20 (×4): 20 mg via ORAL
  Filled 2022-02-18 (×3): qty 2
  Filled 2022-02-18: qty 1

## 2022-02-18 MED ORDER — FENTANYL CITRATE PF 50 MCG/ML IJ SOSY
100.0000 ug | PREFILLED_SYRINGE | Freq: Once | INTRAMUSCULAR | Status: AC
  Administered 2022-02-18: 100 ug via INTRAVENOUS
  Filled 2022-02-18: qty 2

## 2022-02-18 MED ORDER — LORAZEPAM 2 MG/ML IJ SOLN
0.5000 mg | Freq: Four times a day (QID) | INTRAMUSCULAR | Status: DC | PRN
  Administered 2022-02-18 – 2022-02-19 (×2): 0.5 mg via INTRAVENOUS
  Filled 2022-02-18 (×2): qty 1

## 2022-02-18 MED ORDER — PHENOL 1.4 % MT LIQD: 1.0000 | OROMUCOSAL | Status: DC | PRN

## 2022-02-18 MED ORDER — ZOLMITRIPTAN 5 MG PO TBDP: 5.0000 mg | ORAL_TABLET | ORAL | Status: DC | PRN

## 2022-02-18 MED ORDER — ONDANSETRON 4 MG PO TBDP: 4.0000 mg | ORAL_TABLET | Freq: Three times a day (TID) | ORAL | Status: DC | PRN

## 2022-02-18 MED ORDER — ADULT MULTIVITAMIN W/MINERALS CH
1.0000 | ORAL_TABLET | Freq: Every day | ORAL | Status: DC
  Administered 2022-02-18 – 2022-02-20 (×3): 1 via ORAL
  Filled 2022-02-18 (×3): qty 1

## 2022-02-18 MED ORDER — MULTIVITAMINS PO CAPS: 1.0000 | ORAL_CAPSULE | Freq: Every day | ORAL | Status: DC

## 2022-02-18 MED ORDER — OXYCODONE HCL 5 MG PO TABS
10.0000 mg | ORAL_TABLET | Freq: Two times a day (BID) | ORAL | Status: DC | PRN
  Administered 2022-02-18 – 2022-02-20 (×3): 10 mg via ORAL
  Filled 2022-02-18 (×4): qty 2

## 2022-02-18 MED ORDER — PROGESTERONE 200 MG PO CAPS
200.0000 mg | ORAL_CAPSULE | Freq: Every day | ORAL | Status: DC
  Administered 2022-02-19 – 2022-02-20 (×2): 200 mg via ORAL
  Filled 2022-02-18 (×3): qty 1

## 2022-02-18 MED ORDER — ONDANSETRON HCL 4 MG PO TABS: 4.0000 mg | ORAL_TABLET | Freq: Four times a day (QID) | ORAL | Status: DC | PRN

## 2022-02-18 MED ORDER — PANTOPRAZOLE SODIUM 40 MG IV SOLR
40.0000 mg | Freq: Every day | INTRAVENOUS | Status: DC
  Administered 2022-02-18: 40 mg via INTRAVENOUS
  Filled 2022-02-18: qty 10

## 2022-02-18 MED ORDER — SODIUM CHLORIDE 0.9% FLUSH: 3.0000 mL | INTRAVENOUS | Status: DC | PRN

## 2022-02-18 MED ORDER — SODIUM CHLORIDE 0.9 % IV SOLN
250.0000 mL | INTRAVENOUS | Status: DC
  Administered 2022-02-18: 250 mL via INTRAVENOUS

## 2022-02-18 MED ORDER — ALUM & MAG HYDROXIDE-SIMETH 200-200-20 MG/5ML PO SUSP: 30.0000 mL | Freq: Four times a day (QID) | ORAL | Status: DC | PRN

## 2022-02-18 MED ORDER — ACETAMINOPHEN 325 MG PO TABS: 650.0000 mg | ORAL_TABLET | ORAL | Status: DC | PRN

## 2022-02-18 MED ORDER — PROGESTERONE 200 MG PO CAPS
200.0000 mg | ORAL_CAPSULE | ORAL | Status: DC
Start: 2022-02-19 — End: 2022-02-18

## 2022-02-18 MED ORDER — IBUPROFEN 200 MG PO TABS
800.0000 mg | ORAL_TABLET | Freq: Three times a day (TID) | ORAL | Status: DC | PRN
  Administered 2022-02-19 – 2022-02-20 (×2): 800 mg via ORAL
  Filled 2022-02-18 (×2): qty 4

## 2022-02-18 MED ORDER — VITAMIN D 25 MCG (1000 UNIT) PO TABS
10000.0000 [IU] | ORAL_TABLET | ORAL | Status: DC
  Administered 2022-02-18 – 2022-02-20 (×2): 10000 [IU] via ORAL
  Filled 2022-02-18 (×2): qty 10

## 2022-02-18 MED ORDER — BUTALBITAL-APAP-CAFFEINE 50-325-40 MG PO TABS
2.0000 | ORAL_TABLET | Freq: Four times a day (QID) | ORAL | Status: DC | PRN
  Administered 2022-02-18 – 2022-02-19 (×2): 2 via ORAL
  Filled 2022-02-18 (×2): qty 2

## 2022-02-18 MED ORDER — BUTALBITAL-APAP-CAFFEINE 50-325-40 MG PO TABS: 1.0000 | ORAL_TABLET | Freq: Four times a day (QID) | ORAL | Status: DC | PRN

## 2022-02-18 MED ORDER — SODIUM CHLORIDE 0.9% FLUSH
3.0000 mL | Freq: Two times a day (BID) | INTRAVENOUS | Status: DC
  Administered 2022-02-19 (×2): 3 mL via INTRAVENOUS

## 2022-02-18 MED ORDER — RIZATRIPTAN BENZOATE 10 MG PO TBDP: 10.0000 mg | ORAL_TABLET | ORAL | Status: DC | PRN

## 2022-02-18 NOTE — ED Notes (Signed)
Pt called out upset about the fact that she has not been admitted into a room with a better bed. Advised pt that I was pretty sure she wasn't admitted. Advised her if she was admitted it doesn't mean she will get a bed  right away. Pt starts complain about the fact that she has urinated on herself and that her sheets are wet and she has been this way for at least 5 hrs. Apologized to the pt about this and advised her I would get her cleaned up as soon as I could. Pt starts requesting to speak with provider. Advised would make the provider aware of same. I made provider aware of same ?

## 2022-02-18 NOTE — ED Notes (Signed)
Pt placed on hosp bed at this time by [place] NT  ?

## 2022-02-18 NOTE — Progress Notes (Addendum)
Patient ID: [name] [name], female   DOB: 1976-02-27, 46 y.o.   MRN: [id number] ?BP 118/62 (BP Location: Right Arm)   Pulse 65   Temp 98.1 ?F (36.7 ?C) (Oral)   Resp 14   Ht '5\' 6"'$  (1.676 m)   Wt 65.8 kg   SpO2 100%   BMI 23.40 kg/m?  ?Alert, and oriented x 4 ?States she felt more imaging might be necessary since she is weak in her left lower extremity. Her initial exam showed better strength than what I document on her exam.  ?Hip flexors 4/5/ quads 4/5, antagonist muscles are being activated while testing. Dorsiflexion 4/5 but not consistently, and plantarflexion is 2/5, but again antagonist muscles being activated. Highly unlikely that she has deteriorated in the ankle but improved in the left upper extremity.  ?Will order ct lumbar and mri ?No bowel or bladder problems at this time ?

## 2022-02-18 NOTE — ED Notes (Signed)
Pt transported to MRI 

## 2022-02-18 NOTE — ED Notes (Signed)
Pt made aware that she is has not been admitted and that she was waiting for her MRI before they would discuss her being admitted and attempt to get her a bed on the floor. Pt was informed that I do have a hospital bed however at this time I am tied up and unable to do so. Pt is requesting to speak with the provider and a supervisor. Advised pt I would speak with them and let them know. ?

## 2022-02-18 NOTE — ED Notes (Signed)
When entered the room found female visitor laying in bed with the pt. Advised them that shouldn't happen due to it being a safety concern. Advised he should not be in the bed with her. Provided female visitor with 2 chairs ?

## 2022-02-18 NOTE — ED Notes (Signed)
Provider at bedside to speak with pt

## 2022-02-18 NOTE — Progress Notes (Signed)
Transition of Care (TOC) - CAGE-AID Screening ? ? ?Patient Details  ?Name: [name] [name] ?MRN: [id number] ?Date of Birth: January 07, 1976 ? ?Transition of Care (TOC) CM/SW Contact:    ?Clovis Cao, RN ?Phone Number: [phone] ?02/18/2022, 6:22 PM ? ? ?Clinical Narrative: ?Pt states that she does not use drugs but does occasionally drink alcohol.  Denies the need for resources at this time. ? ? ?CAGE-AID Screening: ?  ? ?Have You Ever Felt You Ought to Cut Down on Your Drinking or Drug Use?: No ?Have People Annoyed You By Critizing Your Drinking Or Drug Use?: No ?Have You Felt Bad Or Guilty About Your Drinking Or Drug Use?: No ?Have You Ever Had a Drink or Used Drugs First Thing In The Morning to Steady Your Nerves or to Get Rid of a Hangover?: No ?CAGE-AID Score: 0 ? ?Substance Abuse Education Offered: Yes ? ?  ? ? ? ? ? ? ?

## 2022-02-18 NOTE — Progress Notes (Signed)
Examined patient this morning. Seems to be improving in her left arm strength. Awaiting brachial plexus mri to be done today. No neurosurgical intervention warranted at this time as her c spine mri and c scan were normal.  ?

## 2022-02-18 NOTE — ED Notes (Signed)
Breakfast Orders Placed °

## 2022-02-18 NOTE — ED Notes (Signed)
Pt reports improvement in symptoms. Requesting to see MD because she is not sure she needs MRI. EDP made aware and to speak with pt.  ?

## 2022-02-19 ENCOUNTER — Inpatient Hospital Stay (HOSPITAL_COMMUNITY): Payer: Medicare Other

## 2022-02-19 NOTE — Plan of Care (Signed)

## 2022-02-19 NOTE — TOC CAGE-AID Note (Signed)
Transition of Care (TOC) - CAGE-AID Screening ? ? ?Patient Details  ?Name: [name] [name] ?MRN: [id number] ?Date of Birth: 06/05/1976 ? ?Transition of Care (TOC) CM/SW Contact:    ?Coralee Pesa, LCSWA ?Phone Number: ?02/19/2022, 12:03 PM ? ? ?Clinical Narrative: ?Screening performed yesterday by RN. No concerns noted. Scored 0 on CAGE- AID assessment. ? ? ?CAGE-AID Screening: ?  ? ?Have You Ever Felt You Ought to Cut Down on Your Drinking or Drug Use?: No ?Have People Annoyed You By Critizing Your Drinking Or Drug Use?: No ?Have You Felt Bad Or Guilty About Your Drinking Or Drug Use?: No ?Have You Ever Had a Drink or Used Drugs First Thing In The Morning to Steady Your Nerves or to Get Rid of a Hangover?: No ?CAGE-AID Score: 0 ? ?Substance Abuse Education Offered: Yes ? ?  ? ? ? ? ? ? ?

## 2022-02-19 NOTE — Progress Notes (Signed)
OT Cancellation Note ? ?Patient Details ?Name: [name] [name] ?MRN: [id number] ?DOB: [birth year] ? ? ?Cancelled Treatment:    Reason Eval/Treat Not Completed: Patient at procedure or test/ unavailable Currently being transported to MRI. Will check back for OT eval as schedule permits. ? ?Layla Maw ?02/19/2022, 9:51 AM ?

## 2022-02-19 NOTE — Progress Notes (Signed)
?  Transition of Care (TOC) Screening Note ? ? ?Patient Details  ?Name: [name] [name] ?Date of Birth: 30-Jan-1976 ? ? ?Transition of Care (TOC) CM/SW Contact:    ?Bartholomew Crews, RN ?Phone Number: [phone] ?02/19/2022, 1:22 PM ? ? ? ?Transition of Care Department [facility]) has reviewed patient and no TOC needs have been identified at this time. We will continue to monitor patient advancement through interdisciplinary progression rounds. If new patient transition needs arise, please place a TOC consult. ?  ?

## 2022-02-19 NOTE — Progress Notes (Signed)
Subjective: ?Patient reports  doing well condition headache and postconcussive arm and leg seem to be moving better ? ?Objective: ?Vital signs in last 24 hours: ?Temp:  [98 ?F (36.7 ?C)-98.2 ?F (36.8 ?C)] 98 ?F (36.7 ?C) (05/01 2703) ?Pulse Rate:  [62-88] 79 (05/01 0748) ?Resp:  [13-20] 18 (05/01 0748) ?BP: (103-118)/(57-82) 115/57 (05/01 0748) ?SpO2:  [98 %-100 %] 100 % (05/01 0748) ? ?Intake/Output from previous day: ?04/30 0701 - 05/01 0700 ?In: 485.9 [P.O.:480; I.V.:5.9] ?Out: 160 [Urine:160] ?Intake/Output this shift: ?No intake/output data recorded. ? ?Awake and alert much better spontaneous movement left upper extremity still weak at about 4 out of 5 bicep tricep grip but clearly antigravity raising her arm up overhead left lower extremity seems to be back to baseline of 4+ to 5 out of 5 patient was on await MRI scan ? ?Lab Results: ?Recent Labs  ?  02/17/22 ?1546 02/17/22 ?1601  ?WBC 7.6  --   ?HGB 11.7* 12.2  ?HCT 36.3 36.0  ?PLT 305  --   ? ?BMET ?Recent Labs  ?  02/17/22 ?1546 02/17/22 ?1601  ?NA 140 139  ?K 3.7 3.6  ?CL 109 105  ?CO2 24  --   ?GLUCOSE 90 91  ?BUN 7 7  ?CREATININE 0.61 0.60  ?CALCIUM 9.3  --   ? ? ?Studies/Results: ?CT HEAD WO CONTRAST ? ?Result Date: 02/17/2022 ?CLINICAL DATA:  Choose 1 EXAM: CT HEAD WITHOUT CONTRAST CT CERVICAL SPINE WITHOUT CONTRAST TECHNIQUE: Multidetector CT imaging of the head and cervical spine was performed following the standard protocol without intravenous contrast. Multiplanar CT image reconstructions of the cervical spine were also generated. RADIATION DOSE REDUCTION: This exam was performed according to the departmental dose-optimization program which includes automated exposure control, adjustment of the mA and/or kV according to patient size and/or use of iterative reconstruction technique. COMPARISON:  10/18/2015 FINDINGS: CT HEAD FINDINGS Brain: No evidence of acute infarction, hemorrhage, hydrocephalus, extra-axial collection or mass lesion/mass effect.  Vascular: No hyperdense vessel or unexpected calcification. Skull: Normal. Negative for fracture or focal lesion. Sinuses/Orbits: No acute finding. Other: None. CT CERVICAL SPINE FINDINGS Alignment: Facet joints are aligned without dislocation or traumatic listhesis. Dens and lateral masses are aligned. Skull base and vertebrae: No acute fracture. No primary bone lesion or focal pathologic process. Soft tissues and spinal canal: No prevertebral fluid or swelling. No visible canal hematoma. Disc levels:  Mild disc height loss and endplate spurring at J0-0. Upper chest: Included lung apices are clear. Other: None. IMPRESSION: 1. No acute intracranial abnormality. 2. No acute cervical spine fracture or subluxation. Electronically Signed   By: Davina Poke D.O.   On: 02/17/2022 16:42  ? ?CT CERVICAL SPINE WO CONTRAST ? ?Result Date: 02/17/2022 ?CLINICAL DATA:  Choose 1 EXAM: CT HEAD WITHOUT CONTRAST CT CERVICAL SPINE WITHOUT CONTRAST TECHNIQUE: Multidetector CT imaging of the head and cervical spine was performed following the standard protocol without intravenous contrast. Multiplanar CT image reconstructions of the cervical spine were also generated. RADIATION DOSE REDUCTION: This exam was performed according to the departmental dose-optimization program which includes automated exposure control, adjustment of the mA and/or kV according to patient size and/or use of iterative reconstruction technique. COMPARISON:  10/18/2015 FINDINGS: CT HEAD FINDINGS Brain: No evidence of acute infarction, hemorrhage, hydrocephalus, extra-axial collection or mass lesion/mass effect. Vascular: No hyperdense vessel or unexpected calcification. Skull: Normal. Negative for fracture or focal lesion. Sinuses/Orbits: No acute finding. Other: None. CT CERVICAL SPINE FINDINGS Alignment: Facet joints are aligned without dislocation or  traumatic listhesis. Dens and lateral masses are aligned. Skull base and vertebrae: No acute fracture. No  primary bone lesion or focal pathologic process. Soft tissues and spinal canal: No prevertebral fluid or swelling. No visible canal hematoma. Disc levels:  Mild disc height loss and endplate spurring at I7-1. Upper chest: Included lung apices are clear. Other: None. IMPRESSION: 1. No acute intracranial abnormality. 2. No acute cervical spine fracture or subluxation. Electronically Signed   By: Davina Poke D.O.   On: 02/17/2022 16:42  ? ?CT Thoracic Spine Wo Contrast ? ?Result Date: 02/17/2022 ?CLINICAL DATA:  Ataxia, thoracic trauma EXAM: CT THORACIC SPINE WITHOUT CONTRAST TECHNIQUE: Multidetector CT images of the thoracic were obtained using the standard protocol without intravenous contrast. RADIATION DOSE REDUCTION: This exam was performed according to the departmental dose-optimization program which includes automated exposure control, adjustment of the mA and/or kV according to patient size and/or use of iterative reconstruction technique. COMPARISON:  None. FINDINGS: Alignment: Dextrocurvature.  No anteroposterior listhesis. Vertebrae: Vertebral body heights are maintained apart from minor degenerative endplate irregularity. No acute fracture. There is a small area of sclerosis at the posterior aspect of the T2 vertebral body without aggressive features. Paraspinal and other soft tissues: Unremarkable. Disc levels: No significant osseous encroachment on the spinal canal. Mild facet degenerative changes, for example right facet hypertrophy at T4-T5. IMPRESSION: No acute fracture. Electronically Signed   By: Macy Mis M.D.   On: 02/17/2022 17:17  ? ?CT LUMBAR SPINE WO CONTRAST ? ?Result Date: 02/18/2022 ?CLINICAL DATA:  Lumbar radiculopathy, trauma. EXAM: CT LUMBAR SPINE WITHOUT CONTRAST TECHNIQUE: Multidetector CT imaging of the lumbar spine was performed without intravenous contrast administration. Multiplanar CT image reconstructions were also generated. RADIATION DOSE REDUCTION: This exam was  performed according to the departmental dose-optimization program which includes automated exposure control, adjustment of the mA and/or kV according to patient size and/or use of iterative reconstruction technique. COMPARISON:  04/06/2021. FINDINGS: Segmentation: 5 lumbar type vertebral bodies are assumed. Alignment: There is redemonstration of mild kyphosis at T12-L1, unchanged from previous exams. Vertebrae: No acute fracture is identified. A Schmorl's node is noted in the posterior inferior endplate at I45. Paraspinal and other soft tissues: Negative. Disc levels: Mild endplate osteophyte formation is noted at T12-L1. There is a small right paracentral disc herniation at T12-L1 with no significant spinal canal or neural foraminal stenosis. There is a mild disc herniation at L5-S1 with no significant spinal canal or neural foraminal stenosis. IMPRESSION: 1. No acute fracture. 2. Stable mild kyphosis at T12-L1. 3. Mild disc herniations at T12-L1 and L5-S1 with no significant stenosis. Electronically Signed   By: Brett Fairy M.D.   On: 02/18/2022 23:52  ? ?MR Cervical Spine W or Wo Contrast ? ?Result Date: 02/17/2022 ?CLINICAL DATA:  Cervical radiculopathy EXAM: MRI CERVICAL SPINE WITHOUT AND WITH CONTRAST TECHNIQUE: Multiplanar and multiecho pulse sequences of the cervical spine, to include the craniocervical junction and cervicothoracic junction, were obtained without and with intravenous contrast. CONTRAST:  6.18m GADAVIST GADOBUTROL 1 MMOL/ML IV SOLN COMPARISON:  No prior MRI, correlation is made with CT cervical spine 02/17/2022 FINDINGS: Alignment: Straightening of the normal cervical lordosis. No listhesis. Vertebrae: No acute fracture or suspicious osseous lesion. No abnormal osseous enhancement. Cord: Normal signal and morphology.  No abnormal enhancement. Posterior Fossa, vertebral arteries, paraspinal tissues: Normal craniocervical junction. No acute finding. Disc levels: C2-C3: No significant disc  bulge. No spinal canal stenosis or neuroforaminal narrowing. C3-C4: No significant disc bulge. No spinal canal stenosis  or neuroforaminal narrowing. C4-C5: No significant disc bulge. No spinal canal stenosis or ne

## 2022-02-19 NOTE — Progress Notes (Signed)
Occupational Therapy Evaluation Patient Details Name: [name] [name] MRN: [id number] DOB: [birth year] Today's Date: 02/19/2022   History of Present Illness The pt is a 46 yo female presenting 4/29 with neck pain and quadriparesis after a trampoline injury (100lb child jumped on her). CT and MRI negative for acute bony abnormality or traumatic injury. PMH includes: anxiety, arthritis, DVT, sciatica, and sleep apnea.   Clinical Impression   PTA, pt lives with young children and typically Independent in all daily tasks. Pt presents now with deficits in cognition, vision, L UE function, and dynamic balance. Pt with reports of dizziness, blurry vision and headache consistent with concussion symptoms. Provided handout for activity progression and precautionary measures for concussions for pt/significant other present to review. Overall, pt min guard to supervision for mobility with unsteadiness noted. Pt requires Min A for UB/LB ADLs primarily due to L UE weakness and inability to flex shoulder during MMT. However, pt noted to functionally reach during ADLs with L UE. Based on current presentation, recommend OP OT follow-up for further UE and cognitive function interventions. Pt's significant other at bedside endorses ability to provide increased assist at DC.      Recommendations for follow up therapy are one component of a multi-disciplinary discharge planning process, led by the attending physician.  Recommendations may be updated based on patient status, additional functional criteria and insurance authorization.   Follow Up Recommendations  Outpatient OT    Assistance Recommended at Discharge Frequent or constant Supervision/Assistance (initially)  Patient can return home with the following A little help with walking and/or transfers;A little help with bathing/dressing/bathroom;Assistance with cooking/housework;Direct supervision/assist for medications management;Direct supervision/assist for  financial management;Help with stairs or ramp for entrance;Assist for transportation    Functional Status Assessment  Patient has had a recent decline in their functional status and demonstrates the ability to make significant improvements in function in a reasonable and predictable amount of time.  Equipment Recommendations  None recommended by OT    Recommendations for Other Services       Precautions / Restrictions Precautions Precautions: None Precaution Comments: c-spine cleared, no brace needed Restrictions Weight Bearing Restrictions: No      Mobility Bed Mobility Overal bed mobility: Modified Independent                  Transfers Overall transfer level: Modified independent Equipment used: None               General transfer comment: from toilet      Balance Overall balance assessment: Needs assistance Sitting-balance support: No upper extremity supported, Feet supported Sitting balance-Leahy Scale: Good     Standing balance support: No upper extremity supported, During functional activity Standing balance-Leahy Scale: Fair                             ADL either performed or assessed with clinical judgement   ADL Overall ADL's : Needs assistance/impaired Eating/Feeding: Independent   Grooming: Supervision/safety;Standing;Wash/dry hands Grooming Details (indicate cue type and reason): standing at sink though endorses dizziness. able to reach to soap dispenser Upper Body Bathing: Minimal assistance;Sitting   Lower Body Bathing: Minimal assistance;Sit to/from stand   Upper Body Dressing : Minimal assistance   Lower Body Dressing: Minimal assistance   Toilet Transfer: Min guard;Ambulation Toilet Transfer Details (indicate cue type and reason): significant other assisting pt to bathroom on entry. Pt able to ambulate out of bathroom without assist though unsteady and  reports dizziness Toileting- Clothing Manipulation and Hygiene:  Supervision/safety Toileting - Clothing Manipulation Details (indicate cue type and reason): able to complete without assist       General ADL Comments: Limited by cogniton, headache and blurry vision. Inconsistent assessments of L UE function. Encouraged gentle ROM and continued attempts to complete basic tasks to improve UE function prior to applying resistance     Vision Baseline Vision/History: 1 Wears glasses Ability to See in Adequate Light: 2 Moderately impaired Patient Visual Report: Blurring of vision Vision Assessment?: Vision impaired- to be further tested in functional context Additional Comments: reports new blurry vision, difficulty answering specific questions for further assessment     Perception     Praxis      Pertinent Vitals/Pain Pain Assessment Pain Assessment: Faces Faces Pain Scale: Hurts little more Pain Location: headache Pain Descriptors / Indicators: Headache Pain Intervention(s): Monitored during session, Ice applied     Hand Dominance Right   Extremity/Trunk Assessment Upper Extremity Assessment Upper Extremity Assessment: LUE deficits/detail LUE Deficits / Details: weaker grasp than R UE - provided squeeze ball. elbow ROM impacted by IV site though suspect WFL. When asked to demo shoulder flex, pt with difficulty lifting from bed. PROM WFL. Pt observed to reach to doorways and for soap during ADLs during session LUE Coordination: decreased gross motor   Lower Extremity Assessment Lower Extremity Assessment: Defer to PT evaluation   Cervical / Trunk Assessment Cervical / Trunk Assessment: Other exceptions Cervical / Trunk Exceptions: significant guarding, pt with limited ROM of head and neck (guarding)   Communication Communication Communication: No difficulties   Cognition Arousal/Alertness: Awake/alert Behavior During Therapy: Flat affect Overall Cognitive Status: Impaired/Different from baseline Area of Impairment: Problem solving,  Awareness                           Awareness: Emergent Problem Solving: Slow processing, Decreased initiation, Difficulty sequencing, Requires verbal cues General Comments: pt with increased time to complete all movements and answer all questions, needing cues for safety and sequencing. flat affect, tangential     General Comments  Significant other, James, at bedside.    Exercises     Shoulder Instructions      Home Living Family/patient expects to be discharged to:: Private residence Living Arrangements: Children (3 daughters (13, 73, and 8)) Available Help at Discharge: Family;Available 24 hours/day Type of Home: House Home Access: Stairs to enter [employer] of Steps: 5 Entrance Stairs-Rails: Right Home Layout: One level     Bathroom Shower/Tub: Chief Strategy Officer: Standard     Home Equipment: None          Prior Functioning/Environment Prior Level of Function : Independent/Modified Independent;Driving             Mobility Comments: pt reports full independence, caring for 3 kids (13, 12, and 8) and fixing up her house ADLs Comments: independent        OT Problem List: Decreased strength;Decreased activity tolerance;Decreased cognition;Decreased coordination;Impaired vision/perception;Decreased knowledge of use of DME or AE;Decreased safety awareness;Pain      OT Treatment/Interventions: Self-care/ADL training;Therapeutic exercise;Energy conservation;DME and/or AE instruction;Therapeutic activities;Patient/family education;Balance training    OT Goals(Current goals can be found in the care plan section) Acute Rehab OT Goals Patient Stated Goal: return to independence OT Goal Formulation: With patient Time For Goal Achievement: 03/05/22 Potential to Achieve Goals: Good  OT Frequency: Min 2X/week    Co-evaluation  AM-PAC OT "6 Clicks" Daily Activity     Outcome Measure Help from another person  eating meals?: None Help from another person taking care of personal grooming?: A Little Help from another person toileting, which includes using toliet, bedpan, or urinal?: A Little Help from another person bathing (including washing, rinsing, drying)?: A Little Help from another person to put on and taking off regular upper body clothing?: A Little Help from another person to put on and taking off regular lower body clothing?: A Little 6 Click Score: 19   End of Session Nurse Communication: Mobility status  Activity Tolerance: Patient tolerated treatment well Patient left: in bed;with call bell/phone within reach;with family/visitor present  OT Visit Diagnosis: Unsteadiness on feet (R26.81);Other abnormalities of gait and mobility (R26.89)                Time: 1244-1300 OT Time Calculation (min): 16 min Charges:  OT General Charges $OT Visit: 1 Visit OT Evaluation $OT Eval Moderate Complexity: 1 Mod  Bradd Canary, OTR/L Acute Rehab Services Office: [phone]   Lorre Munroe 02/19/2022, 2:13 PM

## 2022-02-19 NOTE — Evaluation (Signed)
Physical Therapy Evaluation ?Patient Details ?Name: [name] [name] ?MRN: [id number] ?DOB: 09-06-1976 ?Today's Date: 02/19/2022 ? ?History of Present Illness ? 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 CT and MRI cnegative for acute bony abnormality or traumatic injury. PMH includes: anxiety, arthritis, DVT, sciatica, and sleep apnea. ?  ?Clinical Impression ? Pt in bed upon arrival of PT, agreeable to evaluation at this time. Prior to admission the pt was independent with all mobility and activities, taking care of 3 children (13, 12, and 8). The pt now presents with limitations in functional mobility, strength, power, stability, activity tolerance, and coordination due to above dx, and will continue to benefit from skilled PT to address these deficits. The pt was able to complete sit-stand transfers with UE support on bed rail or grab bar, and needed up to minA to steady with gait due to frequent lateral staggering steps and narrow stance with scissoring. The pt maintained eyes closed during most of session, using only a small, step-to pattern and maintained knees flexed for most mobility. Will benefit from trial of cane or RW to provide increased stability with mobility. Discussed gentle ROM and icing for neck stiffness as MRI and CT clear of injury.  ?   ?   ? ?Recommendations for follow up therapy are one component of a multi-disciplinary discharge planning process, led by the attending physician.  Recommendations may be updated based on patient status, additional functional criteria and insurance authorization. ? ?Follow Up Recommendations Outpatient PT ? ?  ?Assistance Recommended at Discharge Intermittent Supervision/Assistance  ?Patient can return home with the following ? A little help with walking and/or transfers;Assistance with cooking/housework;Assist for transportation;Help with stairs or ramp for entrance ? ?   ?Equipment Recommendations Cane (shower chair)  ?Recommendations for Other Services ?    ?  ?Functional Status Assessment Patient has had a recent decline in their functional status and demonstrates the ability to make significant improvements in function in a reasonable and predictable amount of time.  ? ?  ?Precautions / Restrictions Precautions ?Precautions: None ?Precaution Comments: c-spine cleared, no brace needed ?Restrictions ?Weight Bearing Restrictions: No  ? ?  ? ?Mobility ? Bed Mobility ?Overal bed mobility: Modified Independent ?  ?  ?  ?  ?  ?  ?General bed mobility comments: increased time, no assist ?  ? ?Transfers ?Overall transfer level: Modified independent ?Equipment used: None ?  ?  ?  ?  ?  ?  ?  ?General transfer comment: using bed rail or bar in bathroom, no assist and no instability ?  ? ?Ambulation/Gait ?Ambulation/Gait assistance: Min guard, Min assist ?Gait Distance (Feet): 15 Feet (+ 35 ft) ?Assistive device: None ?Gait Pattern/deviations: Step-to pattern, Decreased stride length, Knee flexed in stance - right, Knee flexed in stance - left, Shuffle, Scissoring, Narrow base of support ?Gait velocity: decreased ?  ?  ?General Gait Details: pt with narrow BOS and x2 scissoring steps needing minA to correct. small step-to pattern with shorter strides with LLE than RLE. pt maintains eyes closed for most of session ?  ? ?Balance Overall balance assessment: Needs assistance ?Sitting-balance support: No upper extremity supported, Feet supported ?Sitting balance-Leahy Scale: Good ?  ?  ?Standing balance support: No upper extremity supported, During functional activity ?Standing balance-Leahy Scale: Poor ?Standing balance comment: pt with x2 scissoring and minor LOB needing minA to correct ?  ?  ?  ?  ?  ?  ?  ?  ?  ?  ?  ?   ? ? ? ?  Pertinent Vitals/Pain Pain Assessment ?Pain Assessment: Faces ?Faces Pain Scale: Hurts whole lot ?Pain Location: back of neck, lumbar spine, headache ?Pain  Descriptors / Indicators: Discomfort, Grimacing, Guarding ?Pain Intervention(s): Limited activity within patient's tolerance, Monitored during session, Repositioned  ? ? ?Home Living Family/patient expects to be discharged to:: Private residence ?Living Arrangements: Children (3 daughters (17, 14, and 8)) ?Available Help at Discharge: Family;Available PRN/intermittently ?Type of Home: House ?Home Access: Stairs to enter ?Entrance Stairs-Rails: Right ?Entrance Stairs-Number of Steps: 5 ?  ?Home Layout: One level ?Home Equipment: None ?   ?  ?Prior Function Prior Level of Function : Independent/Modified Independent;Driving ?  ?  ?  ?  ?  ?  ?Mobility Comments: pt reports full independence, caring for 3 kids (13, 12, and 8) and fixing up her house ?ADLs Comments: independent ?  ? ? ?Hand Dominance  ? Dominant Hand: Right ? ?  ?Extremity/Trunk Assessment  ? Upper Extremity Assessment ?Upper Extremity Assessment: Defer to OT evaluation ?  ? ?Lower Extremity Assessment ?Lower Extremity Assessment: LLE deficits/detail;RLE deficits/detail ?RLE Deficits / Details: grossly 4/5, able to maintain with MMT, but does maintain knees flexed in stance. ?RLE Sensation: WNL ?RLE Coordination: WNL ?LLE Deficits / Details: grossly 3+/5, pt denies sensation changes. no knee buckling in stance ?LLE Sensation: WNL ?LLE Coordination: WNL ?  ? ?Cervical / Trunk Assessment ?Cervical / Trunk Assessment: Other exceptions ?Cervical / Trunk Exceptions: significant guarding, pt with limited ROM of head and neck (guarding)  ?Communication  ? Communication: No difficulties  ?Cognition Arousal/Alertness: Awake/alert ?Behavior During Therapy: Flat affect ?Overall Cognitive Status: Impaired/Different from baseline ?Area of Impairment: Problem solving, Awareness ?  ?  ?  ?  ?  ?  ?  ?  ?  ?  ?  ?  ?  ?Awareness: Emergent ?Problem Solving: Slow processing, Decreased initiation, Difficulty sequencing, Requires verbal cues ?General Comments: pt with  increased time to complete all movements and answer all questions, needing cues for safety and sequencing. ?  ?  ? ?  ?General Comments General comments (skin integrity, edema, etc.): BP to 97/74 (83) with pt reporting dizziness after walking ? ?  ?Exercises    ? ?Assessment/Plan  ?  ?PT Assessment Patient needs continued PT services  ?PT Problem List Decreased strength;Decreased activity tolerance;Decreased range of motion;Decreased balance;Decreased mobility;Decreased coordination;Decreased safety awareness;Pain ? ?   ?  ?PT Treatment Interventions Gait training;Stair training;Functional mobility training;Therapeutic activities;Therapeutic exercise;Balance training;Neuromuscular re-education;Patient/family education   ? ?PT Goals (Current goals can be found in the Care Plan section)  ?Acute Rehab PT Goals ?Patient Stated Goal: return home ?PT Goal Formulation: With patient ?Time For Goal Achievement: 03/05/22 ?Potential to Achieve Goals: Good ? ?  ?Frequency Min 4X/week ?  ? ? ?   ?AM-PAC PT "6 Clicks" Mobility  ?Outcome Measure Help needed turning from your back to your side while in a flat bed without using bedrails?: None ?Help needed moving from lying on your back to sitting on the side of a flat bed without using bedrails?: None ?Help needed moving to and from a bed to a chair (including a wheelchair)?: A Little ?Help needed standing up from a chair using your arms (e.g., wheelchair or bedside chair)?: A Little ?Help needed to walk in hospital room?: A Little ?Help needed climbing 3-5 steps with a railing? : A Little ?6 Click Score: 20 ? ?  ?End of Session Equipment Utilized During Treatment: Gait belt ?Activity Tolerance: Patient tolerated treatment well ?Patient left: in  bed;with call bell/phone within reach;with bed alarm set ?Nurse Communication: Mobility status ?PT Visit Diagnosis: Other abnormalities of gait and mobility (R26.89);Muscle weakness (generalized) (M62.81) ?  ? ?Time: 0826-0910 ?PT Time  Calculation (min) (ACUTE ONLY): 44 min ? ? ?Charges:   PT Evaluation ?$PT Eval Moderate Complexity: 1 Mod ?PT Treatments ?$Gait Training: 8-22 mins ?$Therapeutic Activity: 8-22 mins ?  ?   ? ? ?West Carbo, PT, DPT  ? ?Acute Rehabi

## 2022-02-19 NOTE — Plan of Care (Signed)
  Problem: Education: Goal: Knowledge of General Education information will improve Description Including pain rating scale, medication(s)/side effects and non-pharmacologic comfort measures Outcome: Progressing   Problem: Health Behavior/Discharge Planning: Goal: Ability to manage health-related needs will improve Outcome: Progressing   

## 2022-02-20 NOTE — TOC Transition Note (Signed)
Transition of Care (TOC) - CM/SW Discharge Note ? ? ?Patient Details  ?Name: [name] [name] ?MRN: [id number] ?Date of Birth: December 01, 1975 ? ?Transition of Care (TOC) CM/SW Contact:  ?Carles Collet, RN ?Phone Number: ?02/20/2022, 10:06 AM ? ? ?Clinical Narrative:    ?Spoke w patient and significant other.  ?DME cane and shower stool to be delivered to room through [employer] prior to DC. Confirmed with Jasmine that there should not be any copay with her Medicare and Medicaid coverage.  ?Outpatient PT OT referral to [facility] made in Epic and added to AVS, they are aware to call to get a sooner appointment ? ? ? ?Final next level of care: Home/Self Care ?Barriers to Discharge: No Barriers Identified ? ? ?Patient Goals and CMS Choice ?Patient states their goals for this hospitalization and ongoing recovery are:: to go home ?CMS Medicare.gov Compare Post Acute Care list provided to:: Patient ?Choice offered to / list presented to : Patient ? ?Discharge Placement ?  ?           ?  ?  ?  ?  ? ?Discharge Plan and Services ?  ?  ?           ?DME Arranged: Cane, Shower stool ?DME Agency: [employer] ?Date DME Agency Contacted: 02/20/22 ?Time DME Agency Contacted: 0923 ?Representative spoke with at DME Agency: Delana Meyer ?  ?  ?  ?  ?  ? ?Social Determinants of Health (SDOH) Interventions ?  ? ? ?Readmission Risk Interventions ?   ? View : No data to display.  ?  ?  ?  ? ? ? ? ? ?
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
## 2022-02-20 NOTE — Progress Notes (Signed)
Patient refusing to me discharged . Patient wants to appeal discharge. Primary RN, SW and case management informed. ?

## 2022-02-20 NOTE — Progress Notes (Signed)
Occupational Therapy Treatment ?Patient Details ?Name: [name] [name] ?MRN: [id number] ?DOB: 24-Oct-1975 ?Today's Date: 02/20/2022 ? ? 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 CT and MRI negative for acute bony abnormality or traumatic injury. PMH includes: anxiety, arthritis, DVT, sciatica, and sleep apnea. ?  ?OT comments ? Pt with gradual improvements in cognition and L UE strength though still not fully back to baseline. Pt's L UE notably weaker than R UE and unable to lift UE when cued this AM but has been observed with improved shoulder strength per neurosurgery's documentation. Emphasis on gradual acclimation with light sensitivity (encouraged use of sunglasses when discharged today) and progression of L UE exercises. Pt reports improving ADL abilities, had taken a shower with significant other's assistance last night, though declined to complete OOB ADLs at this time. Continue to recommend OP OT at DC.   ? ?Recommendations for follow up therapy are one component of a multi-disciplinary discharge planning process, led by the attending physician.  Recommendations may be updated based on patient status, additional functional criteria and insurance authorization. ?   ?Follow Up Recommendations ? Outpatient OT  ?  ?Assistance Recommended at Discharge Intermittent Supervision/Assistance  ?Patient can return home with the following ? A little help with walking and/or transfers;A little help with bathing/dressing/bathroom;Assistance with cooking/housework;Direct supervision/assist for medications management;Direct supervision/assist for financial management;Help with stairs or ramp for entrance;Assist for transportation ?  ?Equipment Recommendations ? None recommended by OT  ?  ?Recommendations for Other Services   ? ?  ?Precautions / Restrictions Precautions ?Precautions: None ?Precaution Comments: c-spine  cleared, no brace needed ?Restrictions ?Weight Bearing Restrictions: No  ? ? ?  ? ?Mobility Bed Mobility ?Overal bed mobility: Modified Independent ?  ?  ?  ?  ?  ?  ?  ?  ? ?Transfers ?  ?  ?  ?  ?  ?  ?  ?  ?  ?  ?  ?  ?Balance Overall balance assessment: Needs assistance ?Sitting-balance support: No upper extremity supported, Feet supported ?Sitting balance-Leahy Scale: Good ?  ?  ?  ?  ?  ?  ?  ?  ?  ?  ?  ?  ?  ?  ?  ?  ?   ? ?ADL either performed or assessed with clinical judgement  ? ?ADL Overall ADL's : Needs assistance/impaired ?Eating/Feeding: Independent ?  ?  ?  ?  ?  ?  ?  ?  ?  ?  ?  ?  ?  ?  ?  ?  ?  ?  ?General ADL Comments: Per RN/pt, pt completed shower last night with significant other's assist and denies concerns. Pt agreeable for EOB UE exercises but declined OOB ADL assessment, requested OT to come back later due to not sleeping well last night ?  ? ?Extremity/Trunk Assessment Upper Extremity Assessment ?Upper Extremity Assessment: LUE deficits/detail ?LUE Deficits / Details: weaker grasp than R UE - provided squeeze ball. elbow ROM impacted by IV site though suspect WFL. When asked to demo shoulder flex, pt with difficulty lifting from bed. PROM WFL. Pt observed to reach to doorways and for soap during ADLs during previous session. Encouraged self ROM/stretching and isometric exercises of UE with pt able to return demo well ?LUE Coordination: decreased gross motor ?  ?Lower Extremity Assessment ?Lower Extremity Assessment: Defer to PT evaluation ?  ?  ?  ? ?  Vision   ?Vision Assessment?: Vision impaired- to be further tested in functional context ?Additional Comments: new blurriness, reports difficulty when attempting to read last night though in previous PT's session, able to read mychart on phone ?  ?Perception   ?  ?Praxis   ?  ? ?Cognition Arousal/Alertness: Awake/alert ?Behavior During Therapy: Flat affect ?Overall Cognitive Status: Impaired/Different from baseline ?Area of Impairment:  Problem solving, Awareness ?  ?  ?  ?  ?  ?  ?  ?  ?  ?  ?  ?  ?  ?Awareness: Emergent ?Problem Solving: Slow processing, Decreased initiation, Difficulty sequencing, Requires verbal cues ?General Comments: improving response time/awareness though still not fully back to baseline ?  ?  ?   ?Exercises   ? ?  ?Shoulder Instructions   ? ? ?  ?General Comments Continued light sensitivity (reports blinds partially open is the least painful to vision). Encouraged gradual acclimation to lights, use of sunglasses outside when discharged  ? ? ?Pertinent Vitals/ Pain       Pain Assessment ?Pain Assessment: Faces ?Faces Pain Scale: Hurts a little bit ?Pain Location: headache ?Pain Descriptors / Indicators: Headache ?Pain Intervention(s): Monitored during session ? ?Home Living   ?  ?  ?  ?  ?  ?  ?  ?  ?  ?  ?  ?  ?  ?  ?  ?  ?  ?  ? ?  ?Prior Functioning/Environment    ?  ?  ?  ?   ? ?Frequency ? Min 2X/week  ? ? ? ? ?  ?Progress Toward Goals ? ?OT Goals(current goals can now be found in the care plan section) ? Progress towards OT goals: Progressing toward goals ? ?Acute Rehab OT Goals ?Patient Stated Goal: recover UE function, decrease light sensitivity ?OT Goal Formulation: With patient ?Time For Goal Achievement: 03/05/22 ?Potential to Achieve Goals: Good ?ADL Goals ?Pt Will Perform Lower Body Dressing: Independently;sit to/from stand ?Pt Will Transfer to Toilet: Independently;ambulating ?Pt/caregiver will Perform Home Exercise Program: Increased strength;Left upper extremity;Increased ROM;Independently;With written HEP provided ?Additional ADL Goal #1: Pt to participate in further visual assessment ?Additional ADL Goal #2: Pt to complete 2-3 step trail making task with min verbal cues  ?Plan Discharge plan remains appropriate   ? ?Co-evaluation ? ? ?   ?  ?  ?  ?  ? ?  ?AM-PAC OT "6 Clicks" Daily Activity     ?Outcome Measure ? ? Help from another person eating meals?: None ?Help from another person taking care of  personal grooming?: A Little ?Help from another person toileting, which includes using toliet, bedpan, or urinal?: A Little ?Help from another person bathing (including washing, rinsing, drying)?: A Little ?Help from another person to put on and taking off regular upper body clothing?: A Little ?Help from another person to put on and taking off regular lower body clothing?: A Little ?6 Click Score: 19 ? ?  ?End of Session   ? ?OT Visit Diagnosis: Unsteadiness on feet (R26.81);Other abnormalities of gait and mobility (R26.89) ?  ?Activity Tolerance Patient tolerated treatment well ?  ?Patient Left in bed;with call bell/phone within reach ?  ?Nurse Communication Mobility status ?  ? ?   ? ?Time: 7169-6789 ?OT Time Calculation (min): 11 min ? ?Charges: OT General Charges ?$OT Visit: 1 Visit ?OT Treatments ?$Therapeutic Activity: 8-22 mins ? ?Malachy Chamber, OTR/L ?Acute Rehab Services ?Office: [phone]  ? ?Layla Maw ?02/20/2022, 8:28 AM ?

## 2022-02-20 NOTE — Progress Notes (Signed)
Physical Therapy Treatment ?Patient Details ?Name: [name] [name] ?MRN: [id number] ?DOB: Nov 25, 1975 ?Today's Date: 02/20/2022 ? ? ?History of Present Illness Pt is a 46 y.o. female presenting 02/17/22 with neck pain and quadriparesis after a trampoline injury (100lb child jumped on her). CT and MRI negative for acute bony abnormality or traumatic injury. PMH includes anxiety, arthritis, DVT, sciatica, sleep apnea. ?  ?PT Comments  ? ? Pt progressing with mobility. Today's session focused on gait training with SPC and education in preparation for d/c home today. Pt concerned regarding persistent soreness and potential cognitive impairment; therapies continuing to recommend outpatient PT/OT to address these upon return home. Pt also requesting info for services to help care for children while she recovers; RN notified CM. If to remain admitted, will continue to follow acutely. ?   ?Recommendations for follow up therapy are one component of a multi-disciplinary discharge planning process, led by the attending physician.  Recommendations may be updated based on patient status, additional functional criteria and insurance authorization. ? ?Follow Up Recommendations ? Outpatient PT (pt requesting PT @ [facility] for aquatic therapy access) ?  ?  ?Assistance Recommended at Discharge Intermittent Supervision/Assistance  ?Patient can return home with the following A little help with bathing/dressing/bathroom;Assistance with cooking/housework;Assist for transportation;Help with stairs or ramp for entrance ?  ?Equipment Recommendations ? Cane;Shower chair ?  ?Recommendations for Other Services   ? ? ?  ?Precautions / Restrictions Precautions ?Precautions: Fall ?Precaution Comments: c-spine cleared, no brace needed ?Restrictions ?Weight Bearing Restrictions: No  ?  ? ?Mobility ? Bed Mobility ?Overal bed mobility: Independent ?  ?  ?  ?  ?  ?  ?  ?  ? ?Transfers ?Overall transfer level: Independent ?Equipment used: None ?   ?  ?  ?  ?  ?  ?  ?  ?  ? ?Ambulation/Gait ?Ambulation/Gait assistance: Supervision ?Gait Distance (Feet): 60 Feet ?Assistive device: None, Straight cane ?Gait Pattern/deviations: Step-through pattern, Decreased stride length, Trunk flexed, Antalgic ?Gait velocity: Decreased ?  ?  ?General Gait Details: Slow, guarded gait without DME, at times reaching to furniture for UE support; additional gait trial with SPC, pt notes improvement in stability ? ? ?Stairs ?  ?  ?  ?  ?  ? ? ?Wheelchair Mobility ?  ? ?Modified Rankin (Stroke Patients Only) ?  ? ? ?  ?Balance Overall balance assessment: Needs assistance ?Sitting-balance support: No upper extremity supported, Feet supported ?Sitting balance-Leahy Scale: Good ?  ?  ?Standing balance support: No upper extremity supported, During functional activity ?Standing balance-Leahy Scale: Fair ?Standing balance comment: able to walk without DME, preference for UE support ?  ?  ?  ?  ?  ?  ?  ?  ?  ?  ?  ?  ? ?  ?Cognition Arousal/Alertness: Awake/alert ?Behavior During Therapy: Flat affect ?Overall Cognitive Status: No family/caregiver present to determine baseline cognitive functioning ?  ?  ?  ?  ?  ?  ?  ?  ?  ?  ?  ?  ?  ?  ?  ?  ?General Comments: improving response time/awareness though still not fully back to baseline ?  ?  ? ?  ?Exercises   ? ?  ?General Comments General comments (skin integrity, edema, etc.): still with post-concussive symptoms, reviewed strategies to reduce this (including "brain rest"); pt requesting SPC for home use, as well as outpatient ortho PT that has pool access (sounds like she has had this  at [facility] before?), will notify CM ?  ?  ? ?Pertinent Vitals/Pain Pain Assessment ?Pain Assessment: Faces ?Faces Pain Scale: Hurts little more ?Pain Location: "everything" (L side > R side) ?Pain Descriptors / Indicators: Sore, Guarding ?Pain Intervention(s): Monitored during session, Limited activity within patient's tolerance  ? ? ?Home Living    ?  ?  ?  ?  ?  ?  ?  ?  ?  ?   ?  ?Prior Function    ?  ?  ?   ? ?PT Goals (current goals can now be found in the care plan section) Progress towards PT goals: Progressing toward goals ? ?  ?Frequency ? ? ? Min 4X/week ? ? ? ?  ?PT Plan Current plan remains appropriate  ? ? ?Co-evaluation   ?  ?  ?  ?  ? ?  ?AM-PAC PT "6 Clicks" Mobility   ?Outcome Measure ? Help needed turning from your back to your side while in a flat bed without using bedrails?: None ?Help needed moving from lying on your back to sitting on the side of a flat bed without using bedrails?: None ?Help needed moving to and from a bed to a chair (including a wheelchair)?: None ?Help needed standing up from a chair using your arms (e.g., wheelchair or bedside chair)?: None ?Help needed to walk in hospital room?: A Little ?Help needed climbing 3-5 steps with a railing? : A Little ?6 Click Score: 22 ? ?  ?End of Session   ?Activity Tolerance: Patient tolerated treatment well;Patient limited by pain ?Patient left: in bed;with call bell/phone within reach ?Nurse Communication: Mobility status ?PT Visit Diagnosis: Other abnormalities of gait and mobility (R26.89);Muscle weakness (generalized) (M62.81) ?  ? ? ?Time: 9021-1155 ?PT Time Calculation (min) (ACUTE ONLY): 16 min ? ?Charges:  $Self Care/Home Management: 8-22          ?          ? ?Mabeline Caras, PT, DPT ?Acute Rehabilitation Services  ?Pager [phone] ?Office [phone] ? ?[name] [name] ?02/20/2022, 9:49 AM ? ?

## 2022-02-20 NOTE — Discharge Summary (Signed)
Physician Discharge Summary  Patient ID: [name] [name] MRN: [id number] DOB/AGE: 46-Sep-1977 46 y.o.  Admit date: 02/17/2022 Discharge date: 02/20/2022  Admission Diagnoses: left arm weakness   Discharge Diagnoses: same   Discharged Condition: good  Hospital Course: The patient was admitted on 02/17/2022  for left arm weakness after a child jumped on her head while under a trampoline. The hospital course was routine. There were no complications. The wound remained clean dry and intact.  The patient remained afebrile with stable vital signs, and tolerated a regular diet. The patient continued to increase activities, and pain was well controlled with oral pain medications. The patients strength has improved over the last couple days.   Consults: None  Significant Diagnostic Studies:  Results for orders placed or performed during the hospital encounter of 02/17/22  Resp Panel by RT-PCR (Flu A&B, Covid) Nasopharyngeal Swab   Specimen: Nasopharyngeal Swab; Nasopharyngeal(NP) swabs in vial transport medium  Result Value Ref Range   SARS Coronavirus 2 by RT PCR NEGATIVE NEGATIVE   Influenza A by PCR NEGATIVE NEGATIVE   Influenza B by PCR NEGATIVE NEGATIVE  Comprehensive metabolic panel  Result Value Ref Range   Sodium 140 135 - 145 mmol/L   Potassium 3.7 3.5 - 5.1 mmol/L   Chloride 109 98 - 111 mmol/L   CO2 24 22 - 32 mmol/L   Glucose, Bld 90 70 - 99 mg/dL   BUN 7 6 - 20 mg/dL   Creatinine, Ser 0.61 0.44 - 1.00 mg/dL   Calcium 9.3 8.9 - 10.3 mg/dL   Total Protein 6.7 6.5 - 8.1 g/dL   Albumin 4.1 3.5 - 5.0 g/dL   AST 22 15 - 41 U/L   ALT 15 0 - 44 U/L   Alkaline Phosphatase 36 (L) 38 - 126 U/L   Total Bilirubin 0.6 0.3 - 1.2 mg/dL   GFR, Estimated >60 >60 mL/min   Anion gap 7 5 - 15  CBC  Result Value Ref Range   WBC 7.6 4.0 - 10.5 K/uL   RBC 4.10 3.87 - 5.11 MIL/uL   Hemoglobin 11.7 (L) 12.0 - 15.0 g/dL   HCT 36.3 36.0 - 46.0 %   MCV 88.5 80.0 - 100.0 fL   MCH 28.5 26.0 - 34.0  pg   MCHC 32.2 30.0 - 36.0 g/dL   RDW 13.6 11.5 - 15.5 %   Platelets 305 150 - 400 K/uL   nRBC 0.0 0.0 - 0.2 %  Ethanol  Result Value Ref Range   Alcohol, Ethyl (B) <10 <10 mg/dL  Urinalysis, Routine w reflex microscopic Urine, Clean Catch  Result Value Ref Range   Color, Urine YELLOW YELLOW   APPearance HAZY (A) CLEAR   Specific Gravity, Urine 1.014 1.005 - 1.030   pH 8.0 5.0 - 8.0   Glucose, UA NEGATIVE NEGATIVE mg/dL   Hgb urine dipstick NEGATIVE NEGATIVE   Bilirubin Urine NEGATIVE NEGATIVE   Ketones, ur NEGATIVE NEGATIVE mg/dL   Protein, ur NEGATIVE NEGATIVE mg/dL   Nitrite NEGATIVE NEGATIVE   Leukocytes,Ua TRACE (A) NEGATIVE   RBC / HPF 0-5 0 - 5 RBC/hpf   WBC, UA 0-5 0 - 5 WBC/hpf   Bacteria, UA RARE (A) NONE SEEN   Squamous Epithelial / LPF 6-10 0 - 5  Lactic acid, plasma  Result Value Ref Range   Lactic Acid, Venous 0.9 0.5 - 1.9 mmol/L  Protime-INR  Result Value Ref Range   Prothrombin Time 14.1 11.4 - 15.2 seconds   INR 1.1  0.8 - 1.2  I-Stat Chem 8, ED  Result Value Ref Range   Sodium 139 135 - 145 mmol/L   Potassium 3.6 3.5 - 5.1 mmol/L   Chloride 105 98 - 111 mmol/L   BUN 7 6 - 20 mg/dL   Creatinine, Ser 0.60 0.44 - 1.00 mg/dL   Glucose, Bld 91 70 - 99 mg/dL   Calcium, Ion 1.16 1.15 - 1.40 mmol/L   TCO2 23 22 - 32 mmol/L   Hemoglobin 12.2 12.0 - 15.0 g/dL   HCT 36.0 36.0 - 46.0 %  I-Stat beta hCG blood, ED  Result Value Ref Range   I-stat hCG, quantitative <5.0 <5 mIU/mL   Comment 3            CT HEAD WO CONTRAST  Result Date: 02/17/2022 CLINICAL DATA:  Choose 1 EXAM: CT HEAD WITHOUT CONTRAST CT CERVICAL SPINE WITHOUT CONTRAST TECHNIQUE: Multidetector CT imaging of the head and cervical spine was performed following the standard protocol without intravenous contrast. Multiplanar CT image reconstructions of the cervical spine were also generated. RADIATION DOSE REDUCTION: This exam was performed according to the departmental dose-optimization program  which includes automated exposure control, adjustment of the mA and/or kV according to patient size and/or use of iterative reconstruction technique. COMPARISON:  10/18/2015 FINDINGS: CT HEAD FINDINGS Brain: No evidence of acute infarction, hemorrhage, hydrocephalus, extra-axial collection or mass lesion/mass effect. Vascular: No hyperdense vessel or unexpected calcification. Skull: Normal. Negative for fracture or focal lesion. Sinuses/Orbits: No acute finding. Other: None. CT CERVICAL SPINE FINDINGS Alignment: Facet joints are aligned without dislocation or traumatic listhesis. Dens and lateral masses are aligned. Skull base and vertebrae: No acute fracture. No primary bone lesion or focal pathologic process. Soft tissues and spinal canal: No prevertebral fluid or swelling. No visible canal hematoma. Disc levels:  Mild disc height loss and endplate spurring at X9-3. Upper chest: Included lung apices are clear. Other: None. IMPRESSION: 1. No acute intracranial abnormality. 2. No acute cervical spine fracture or subluxation. Electronically Signed   By: Davina Poke D.O.   On: 02/17/2022 16:42   CT CERVICAL SPINE WO CONTRAST  Result Date: 02/17/2022 CLINICAL DATA:  Choose 1 EXAM: CT HEAD WITHOUT CONTRAST CT CERVICAL SPINE WITHOUT CONTRAST TECHNIQUE: Multidetector CT imaging of the head and cervical spine was performed following the standard protocol without intravenous contrast. Multiplanar CT image reconstructions of the cervical spine were also generated. RADIATION DOSE REDUCTION: This exam was performed according to the departmental dose-optimization program which includes automated exposure control, adjustment of the mA and/or kV according to patient size and/or use of iterative reconstruction technique. COMPARISON:  10/18/2015 FINDINGS: CT HEAD FINDINGS Brain: No evidence of acute infarction, hemorrhage, hydrocephalus, extra-axial collection or mass lesion/mass effect. Vascular: No hyperdense vessel or  unexpected calcification. Skull: Normal. Negative for fracture or focal lesion. Sinuses/Orbits: No acute finding. Other: None. CT CERVICAL SPINE FINDINGS Alignment: Facet joints are aligned without dislocation or traumatic listhesis. Dens and lateral masses are aligned. Skull base and vertebrae: No acute fracture. No primary bone lesion or focal pathologic process. Soft tissues and spinal canal: No prevertebral fluid or swelling. No visible canal hematoma. Disc levels:  Mild disc height loss and endplate spurring at Z1-6. Upper chest: Included lung apices are clear. Other: None. IMPRESSION: 1. No acute intracranial abnormality. 2. No acute cervical spine fracture or subluxation. Electronically Signed   By: Davina Poke D.O.   On: 02/17/2022 16:42   CT Thoracic Spine Wo Contrast  Result Date: 02/17/2022  CLINICAL DATA:  Ataxia, thoracic trauma EXAM: CT THORACIC SPINE WITHOUT CONTRAST TECHNIQUE: Multidetector CT images of the thoracic were obtained using the standard protocol without intravenous contrast. RADIATION DOSE REDUCTION: This exam was performed according to the departmental dose-optimization program which includes automated exposure control, adjustment of the mA and/or kV according to patient size and/or use of iterative reconstruction technique. COMPARISON:  None. FINDINGS: Alignment: Dextrocurvature.  No anteroposterior listhesis. Vertebrae: Vertebral body heights are maintained apart from minor degenerative endplate irregularity. No acute fracture. There is a small area of sclerosis at the posterior aspect of the T2 vertebral body without aggressive features. Paraspinal and other soft tissues: Unremarkable. Disc levels: No significant osseous encroachment on the spinal canal. Mild facet degenerative changes, for example right facet hypertrophy at T4-T5. IMPRESSION: No acute fracture. Electronically Signed   By: Macy Mis M.D.   On: 02/17/2022 17:17   CT LUMBAR SPINE WO CONTRAST  Result  Date: 02/18/2022 CLINICAL DATA:  Lumbar radiculopathy, trauma. EXAM: CT LUMBAR SPINE WITHOUT CONTRAST TECHNIQUE: Multidetector CT imaging of the lumbar spine was performed without intravenous contrast administration. Multiplanar CT image reconstructions were also generated. RADIATION DOSE REDUCTION: This exam was performed according to the departmental dose-optimization program which includes automated exposure control, adjustment of the mA and/or kV according to patient size and/or use of iterative reconstruction technique. COMPARISON:  04/06/2021. FINDINGS: Segmentation: 5 lumbar type vertebral bodies are assumed. Alignment: There is redemonstration of mild kyphosis at T12-L1, unchanged from previous exams. Vertebrae: No acute fracture is identified. A Schmorl's node is noted in the posterior inferior endplate at K16. Paraspinal and other soft tissues: Negative. Disc levels: Mild endplate osteophyte formation is noted at T12-L1. There is a small right paracentral disc herniation at T12-L1 with no significant spinal canal or neural foraminal stenosis. There is a mild disc herniation at L5-S1 with no significant spinal canal or neural foraminal stenosis. IMPRESSION: 1. No acute fracture. 2. Stable mild kyphosis at T12-L1. 3. Mild disc herniations at T12-L1 and L5-S1 with no significant stenosis. Electronically Signed   By: Brett Fairy M.D.   On: 02/18/2022 23:52   MR LUMBAR SPINE WO CONTRAST  Result Date: 02/19/2022 CLINICAL DATA:  Trauma. Lumbar radiculopathy. Left lower extremity weakness EXAM: MRI LUMBAR SPINE WITHOUT CONTRAST TECHNIQUE: Multiplanar, multisequence MR imaging of the lumbar spine was performed. No intravenous contrast was administered. COMPARISON:  04/06/2021 FINDINGS: Segmentation:  5 lumbar type vertebrae Alignment:  Physiologic. Vertebrae:  No fracture, evidence of discitis, or bone lesion. Conus medullaris and cauda equina: Conus extends to the T12-L1 level. Conus and cauda equina appear  normal. 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 Disc levels: T12- L1: Mild disc desiccation with small protrusion and buttressing osteophyte. No impingement L5-S1:Mild disc desiccation and narrowing with chronic, noncompressive bilateral paracentral protrusion. Negative facets. IMPRESSION: 1. No acute finding. 2. Noncompressive degenerative change that is not progressed from June 2022. Electronically Signed   By: Jorje Guild M.D.   On: 02/19/2022 11:18   MR Cervical Spine W or Wo Contrast  Result Date: 02/17/2022 CLINICAL DATA:  Cervical radiculopathy EXAM: MRI CERVICAL SPINE WITHOUT AND WITH CONTRAST TECHNIQUE: Multiplanar and multiecho pulse sequences of the cervical spine, to include the craniocervical junction and cervicothoracic junction, were obtained without and with intravenous contrast. CONTRAST:  6.83m GADAVIST GADOBUTROL 1 MMOL/ML IV SOLN COMPARISON:  No prior MRI, correlation is made with  CT cervical spine 02/17/2022 FINDINGS: Alignment: Straightening of the normal cervical lordosis. No listhesis. Vertebrae: No acute fracture or suspicious osseous lesion. No abnormal osseous enhancement. Cord: Normal signal and morphology.  No abnormal enhancement. Posterior Fossa, vertebral arteries, paraspinal tissues: Normal craniocervical junction. No acute finding. Disc levels: C2-C3: No significant disc bulge. No spinal canal stenosis or neuroforaminal narrowing. C3-C4: No significant disc bulge. No spinal canal stenosis or neuroforaminal narrowing. C4-C5: No significant disc bulge. No spinal canal stenosis or neuroforaminal narrowing. C5-C6: Mild disc bulge. Facet and uncovertebral hypertrophy. No spinal canal stenosis. Mild right neural foraminal narrowing. C6-C7: No significant disc bulge. No spinal canal stenosis or neuroforaminal narrowing. C7-T1: No significant disc bulge. No  spinal canal stenosis or neuroforaminal narrowing. IMPRESSION: 1. No acute fracture or other traumatic injury in the cervical spine. 2. C5-C6 mild right neural foraminal narrowing. Electronically Signed   By: Merilyn Baba M.D.   On: 02/17/2022 20:13   MR BRACHIAL PLEXUS W/O CM LT  Result Date: 02/18/2022 CLINICAL DATA:  Upper arm pain, traumatic. Transient quadriparesis, improving EXAM: MRI BRACHIAL PLEXUS WITHOUT CONTRAST TECHNIQUE: Multiplanar, multiecho pulse sequences of the neck and surrounding structures were obtained without intravenous contrast. The field of view was focused on the left brachial plexus from the neural foramina to the axilla. CONTRAST:  None. COMPARISON:  Cervical spine MRI 02/17/2022 FINDINGS: Spinal cord Normal signal and morphology of the cervical cord. Small noncompressive disc bulge noted at C5-6. Brachial plexus: Scalene triangle, costoclavicular space, and pectoralis minor space are within normal limits. No evidence of fibrous band or mass lesion. Roots: Unremarkable. Trunks: Unremarkable. Divisions: Unremarkable. Cords: Unremarkable. Distal brachial plexus/branches: Unremarkable. Muscles and tendons Faint intramuscular edema within the left teres minor muscle (series 10, image 26). Musculature of the shoulder girdle and visualized chest wall appears otherwise normal without edema, atrophy, or fatty infiltration. Rotator cuff tendons appear intact. Bones Small left C7 cervical rib. The left first rib is normal in appearance. Visualized marrow structures are unremarkable. No fracture or marrow replacing lesion. Joints Sternoclavicular, acromioclavicular, and glenohumeral joints appear within normal limits. No joint effusion. Other findings No appreciable abnormality within the left quadrilateral space. IMPRESSION: 1. Faint intramuscular edema within the left teres minor muscle, which may be related to muscle strain. This could also be secondary to acute posttraumatic quadrilateral  space syndrome. Of note, no appreciable mass lesion or other abnormality is identified within the left quadrilateral space. 2. Otherwise, no findings of traumatic left brachial plexopathy. 3. Small left C7 cervical rib. 4. Small noncompressive disc bulge at C5-6. Electronically Signed   By: Davina Poke D.O.   On: 02/18/2022 11:19    Antibiotics:  Anti-infectives (From admission, onward)    None       Discharge Exam: Blood pressure (!) 97/42, pulse 80, temperature 98.7 F (37.1 C), resp. rate 14, height '5\' 6"'$  (1.676 m), weight 65.8 kg, SpO2 99 %, unknown if currently breastfeeding. Neurologic: Grossly normal Ambulating and voiding well  Discharge Medications:   Allergies as of 02/20/2022       Reactions   Latex Rash   Eggs Or Egg-derived Products Nausea And Vomiting   She cannot have flu vaccination.   Acetaminophen Other (See Comments)   Itchy, hives, rash, stomach pain, nausea   Influenza Vaccines    Metoprolol Other (See Comments)   dizziness   Venlafaxine    Reports one time seizure on this medication (occurred 20+ years ago)   Verapamil Nausea Only   Headache, dizziness  Medication List     TAKE these medications    amphetamine-dextroamphetamine 10 MG tablet Commonly known as: ADDERALL Take 10 mg by mouth daily.   amphetamine-dextroamphetamine 20 MG tablet Commonly known as: ADDERALL Take 20 mg by mouth 2 (two) times daily.   BOTOX IM Inject 200 Units into the muscle every 3 (three) months.   butalbital-acetaminophen-caffeine 50-325-40 MG tablet Commonly known as: FIORICET TAKE 1 TABLET BY MOUTH EVERY 6 HOURS AS NEEDED FOR HEADACHE **NO MORE THAN 10 TABS PER MONTH** What changed: See the new instructions.   ibuprofen 800 MG tablet Commonly known as: ADVIL Take 800 mg by mouth every 8 (eight) hours as needed.   multivitamin capsule Take 1 capsule by mouth daily.   ondansetron 4 MG disintegrating tablet Commonly known as: Zofran ODT Take 1  tablet (4 mg total) by mouth every 8 (eight) hours as needed.   Oxycodone HCl 10 MG Tabs Take 10 mg by mouth 2 (two) times daily as needed (pain).   progesterone 200 MG capsule Commonly known as: PROMETRIUM Take 200 mg by mouth 2 (two) times a week. No set days. Increases to 200 mg daily x 10 days with increased uterine bleeding   rizatriptan 10 MG disintegrating tablet Commonly known as: Maxalt-MLT Take 1 tablet (10 mg total) by mouth as needed. May repeat in 2 hours if needed   VITAMIN D PO Take 10,000 Units by mouth every other day.   zolmitriptan 5 MG disintegrating tablet Commonly known as: Zomig ZMT Take 1 tablet (5 mg total) by mouth as needed for migraine.        Disposition: home   Final Dx: left arm weakness  Discharge Instructions     Call MD for:  difficulty breathing, headache or visual disturbances   Complete by: As directed    Call MD for:  hives   Complete by: As directed    Call MD for:  persistant nausea and vomiting   Complete by: As directed    Call MD for:  redness, tenderness, or signs of infection (pain, swelling, redness, odor or green/yellow discharge around incision site)   Complete by: As directed    Call MD for:  severe uncontrolled pain   Complete by: As directed    Call MD for:  temperature >100.4   Complete by: As directed    Diet - low sodium heart healthy   Complete by: As directed    Increase activity slowly   Complete by: As directed           Signed: Ocie Cornfield Kayler Rise 02/20/2022, 7:56 AM

## 2022-02-21 ENCOUNTER — Other Ambulatory Visit: Payer: Self-pay | Admitting: Internal Medicine

## 2022-02-21 DIAGNOSIS — M533 Sacrococcygeal disorders, not elsewhere classified: Secondary | ICD-10-CM

## 2022-03-09 ENCOUNTER — Ambulatory Visit (HOSPITAL_BASED_OUTPATIENT_CLINIC_OR_DEPARTMENT_OTHER): Payer: Medicare Other | Attending: Neurosurgery | Admitting: Physical Therapy

## 2022-03-09 DIAGNOSIS — M62838 Other muscle spasm: Secondary | ICD-10-CM | POA: Insufficient documentation

## 2022-03-09 DIAGNOSIS — M542 Cervicalgia: Secondary | ICD-10-CM | POA: Insufficient documentation

## 2022-03-09 NOTE — Therapy (Unsigned)
OUTPATIENT PHYSICAL THERAPY CERVICAL EVALUATION   Patient Name: [name] [name] MRN: [id number] DOB:07/28/1976, 46 y.o., female Today's Date: 03/12/2022   PT End of Session - 03/12/22 2031     Visit Number 1    Number of Visits 17    Date for PT Re-Evaluation 05/11/22    Authorization Type MCR    Progress Note Due on Visit 10    PT Start Time 0930    PT Stop Time 5573    PT Time Calculation (min) 45 min    Activity Tolerance Patient limited by pain    Behavior During Therapy Anxious             Past Medical History:  Diagnosis Date   Adenomatous colon polyp 2008   Anxiety 1999/2000   Arthritis    "jaw joints; neck" (22/0/2542)   Complication of anesthesia    "didn't wake up well/remembered stuff from during OR when I had pituitary surgery"   Constipation    Depression    hx   DVT (deep vein thrombosis) in pregnancy 09/2013   "LLE; postpartum"   Fibromyalgia    GERD (gastroesophageal reflux disease)    H/O hiatal hernia    Hematochezia    IBS (irritable bowel syndrome)    Internal hemorrhoids    Migraines    "couple times/wk" (07/28/2014)   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 Silvano Rusk   ESOPHAGOGASTRODUODENOSCOPY  09/29/2004   Dr. Kennedy Bucker   LAPAROSCOPIC APPENDECTOMY N/A 07/28/2014   Procedure: APPENDECTOMY LAPAROSCOPIC;  Surgeon: Autumn Messing III, MD;  Location: [facility] OR;  Service: General;  Laterality: N/A;   TRANSPHENOIDAL / TRANSNASAL HYPOPHYSECTOMY / RESECTION PITUITARY TUMOR  1999   resection of benign pituitary tumor, [place]  2008   Patient Active Problem List   Diagnosis Date Noted   Neck injury 02/18/2022   Chronic migraine w/o  aura w/o status migrainosus, not intractable 03/21/2021   Protrusion of lumbar intervertebral disc 03/09/2021   Depression 03/16/2020   Ptosis of right eyelid 11/22/2017   Abnormal brain MRI 01/30/2017   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   Pain 06/21/2016   Tick bite of left lower leg 06/21/2016   Chronic fatigue 06/21/2016   History of pituitary surgery 09/26/2015   ADHD (attention deficit hyperactivity disorder) 05/04/2015   Anxiety state 03/08/2015   Upper respiratory tract infection 11/30/2014   Acute appendicitis 07/28/2014   Other malaise and fatigue 03/09/2014   Unspecified vitamin D deficiency 03/09/2014   Postpartum care following vaginal delivery (12/18) 10/08/2013   Normal labor and delivery 10/08/2013   Chronic migraine 01/20/2013   Headache, chronic migraine without aura, intractable 03/18/2012   Myalgia and myositis 03/18/2012   Lumbar spondylosis 03/18/2012   PERSONAL HISTORY OF ALLERGY TO EGGS- GI SENSITIVITY 05/04/2010   CHANGE IN BOWELS 05/02/2010   IRRITABLE BOWEL SYNDROME 12/04/2009   NONSPECIFIC ABN FINDNG RAD&OTH EXAM BILARY TRCT 11/30/2009   GERD 01/31/2009   ABDOMINAL PAIN-EPIGASTRIC 01/31/2009   PERSONAL HX COLONIC POLYPS 12/14/2008   INTERNAL HEMORRHOIDS 01/03/2007   HIATAL HERNIA 09/29/2004    PCP: None  REFERRING PROVIDER: Kary Kos, MD  REFERRING  DIAG: M54.2 (ICD-10-CM) - Neck pain  THERAPY DIAG:  Cervicalgia  Other muscle spasm  Rationale for Evaluation and Treatment Rehabilitation  ONSET DATE: 4/29  SUBJECTIVE:                                                                                                                                                                                                         SUBJECTIVE STATEMENT: I was under a trampoline and was jumped on- could not move my left side. Was unconscious for a bit. I was unable to move the left side of my body. Pt eval on 5/1 reported some assistance required. Single  parent of 3 kids- teen, tween, 55yo.   PERTINENT HISTORY:  See history listed  PAIN:  Are you having pain? Yes: NPRS scale: up to severe in day/10 Pain location: spine/head Pain description: my brain hurts Aggravating factors: being upright-gravity Relieving factors: laying down  PRECAUTIONS: Other: post concussion  WEIGHT BEARING RESTRICTIONS No  FALLS:  Has patient fallen in last 6 months? No  LIVING ENVIRONMENT: Lives with:  kids  PLOF: Independent  PATIENT GOALS decrease pain, complete daily activities, get back to exercise  OBJECTIVE:   DIAGNOSTIC FINDINGS:  4/29 MRI IMPRESSION: 1. No acute fracture or other traumatic injury in the cervical spine. 2. C5-C6 mild right neural foraminal narrowing.     COGNITION: Overall cognitive status: Difficulty to assess- pt reports cognitive difficulties since the accident   SENSATION: WFL  POSTURE: found in lobby in bent over posture with head in chair and ambulated with flexed posture  PALPATION: Significant spasm in cervical and lumbopelvic region   CERVICAL ROM:  grossly limited due to guarding at eval  Active ROM A/PROM (deg) eval  Flexion   Extension   Right lateral flexion   Left lateral flexion   Right rotation   Left rotation    (Blank rows = not tested)  UUPPER EXTREMITY MMT:  MMT Right eval Left eval  Shoulder flexion    Shoulder extension    Shoulder abduction    Shoulder adduction    Shoulder extension    Shoulder internal rotation    Shoulder external rotation    Middle trapezius    Lower trapezius    Elbow flexion    Elbow extension    Wrist flexion    Wrist extension    Wrist ulnar deviation    Wrist radial deviation    Wrist pronation    Wrist supination    Grip strength     (Blank rows = not tested)  TODAY'S TREATMENT:  EVAL Creep stretch to Lt mid sacrum Suboccipital release, lateral cervical mobs   PATIENT EDUCATION:  Education details: Anatomy of condition, POC,  HEP, exercise form/rationale. aquatics Person educated: Patient Education method: Explanation Education comprehension: verbalized understanding   HOME EXERCISE PROGRAM: [id number] - from past episode  ASSESSMENT:  CLINICAL IMPRESSION: Patient is a 46 y.o. F who was seen today for physical therapy evaluation and treatment for neck and back pain following head injury that resulted in concussion, hospitalization and central nervous system injuries that affected the left side of her body. She continues to have concussive symptoms and we discussed care to allow her brain to heal. Overall she will benefit from skilled PT to address ealy symptoms along musculoskeletal system and assist in return to PLOF. Message was sent to MD that she is scheduled to follow up with this week and I encouraged her to schedule a f/u with Dr Saintclair Halsted as nothing was scheduled following hospitalization.    OBJECTIVE IMPAIRMENTS Abnormal gait, decreased activity tolerance, decreased knowledge of condition, difficulty walking, decreased ROM, increased muscle spasms, impaired flexibility, impaired UE functional use, impaired vision/preception, postural dysfunction, and pain.   ACTIVITY LIMITATIONS meal prep, cleaning, laundry, personal finances, driving, shopping, and community activity.   PERSONAL FACTORS 1-2 comorbidities: see history above  are also affecting patient's functional outcome.    REHAB POTENTIAL: Good  CLINICAL DECISION MAKING: Unstable/unpredictable  EVALUATION COMPLEXITY: High   GOALS: Goals reviewed with patient? Yes  SHORT TERM GOALS: Target date: 04/02/2022   Pt will verbalize proper rest through her day to reduce migraine symptoms Baseline:  Goal status: INITIAL   LONG TERM GOALS: Target date: 05/14/2022  Pt will be able to drive around the local area without increase in symptoms Baseline:  Goal status: INITIAL  2.  Able to return to gentle exercise such as yoga Baseline:  Goal status:  INITIAL  3.  Pt will be able to demo proper bending and lifting form necessary for ADLs Baseline:  Goal status: INITIAL  4.  Further goals to be set as care progressed- dependent on concussion symptoms Baseline:  Goal status: INITIAL   PLAN: PT FREQUENCY: 2x/week  PT DURATION: 8 weeks  PLANNED INTERVENTIONS: Therapeutic exercises, Therapeutic activity, Neuromuscular re-education, Balance training, Gait training, Patient/Family education, Joint mobilization, Aquatic Therapy, Dry Needling, Electrical stimulation, Spinal mobilization, Cryotherapy, Moist heat, Taping, Manual therapy, and Re-evaluation  PLAN FOR NEXT SESSION: begin aquatics  Miller Limehouse C. Akeela Busk PT, DPT 03/12/22 8:48 PM

## 2022-03-09 NOTE — Therapy (Incomplete)
OUTPATIENT PHYSICAL THERAPY CERVICAL EVALUATION   Patient Name: [name] [name] MRN: [id number] DOB:21-Jan-1976, 46 y.o., female Today's Date: 03/09/2022    Past Medical History:  Diagnosis Date   Adenomatous colon polyp 2008   Anxiety 1999/2000   Arthritis    "jaw joints; neck" (76/05/1156)   Complication of anesthesia    "didn't wake up well/remembered stuff from during OR when I had pituitary surgery"   Constipation    Depression    hx   DVT (deep vein thrombosis) in pregnancy 09/2013   "LLE; postpartum"   Fibromyalgia    GERD (gastroesophageal reflux disease)    H/O hiatal hernia    Hematochezia    IBS (irritable bowel syndrome)    Internal hemorrhoids    Migraines    "couple times/wk" (07/28/2014)   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 Silvano Rusk   ESOPHAGOGASTRODUODENOSCOPY  09/29/2004   Dr. Kennedy Bucker   LAPAROSCOPIC APPENDECTOMY N/A 07/28/2014   Procedure: APPENDECTOMY LAPAROSCOPIC;  Surgeon: Autumn Messing III, MD;  Location: [place] OR;  Service: General;  Laterality: N/A;   TRANSPHENOIDAL / TRANSNASAL HYPOPHYSECTOMY / RESECTION PITUITARY TUMOR  1999   resection of benign pituitary tumor, [place]  2008   Patient Active Problem List   Diagnosis Date Noted   Neck injury 02/18/2022   Chronic migraine w/o aura w/o status migrainosus, not intractable 03/21/2021   Protrusion of lumbar intervertebral disc 03/09/2021   Depression 03/16/2020   Ptosis of right eyelid 11/22/2017   Abnormal brain MRI 01/30/2017   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   Pain 06/21/2016   Tick bite of left lower leg 06/21/2016   Chronic fatigue 06/21/2016   History of pituitary  surgery 09/26/2015   ADHD (attention deficit hyperactivity disorder) 05/04/2015   Anxiety state 03/08/2015   Upper respiratory tract infection 11/30/2014   Acute appendicitis 07/28/2014   Other malaise and fatigue 03/09/2014   Unspecified vitamin D deficiency 03/09/2014   Postpartum care following vaginal delivery (12/18) 10/08/2013   Normal labor and delivery 10/08/2013   Chronic migraine 01/20/2013   Headache, chronic migraine without aura, intractable 03/18/2012   Myalgia and myositis 03/18/2012   Lumbar spondylosis 03/18/2012   PERSONAL HISTORY OF ALLERGY TO EGGS- GI SENSITIVITY 05/04/2010   CHANGE IN BOWELS 05/02/2010   IRRITABLE BOWEL SYNDROME 12/04/2009   NONSPECIFIC ABN FINDNG RAD&OTH EXAM BILARY TRCT 11/30/2009   GERD 01/31/2009   ABDOMINAL PAIN-EPIGASTRIC 01/31/2009   PERSONAL HX COLONIC POLYPS 12/14/2008   INTERNAL HEMORRHOIDS 01/03/2007   HIATAL HERNIA 09/29/2004    PCP: N/A  REFERRING PROVIDER: Kary Kos, MD  REFERRING DIAG: M54.2 (ICD-10-CM) - Neck pain  THERAPY DIAG:  No diagnosis found.  Rationale for Evaluation and Treatment Rehabilitation  ONSET DATE: 02/17/22 ED Visit   SUBJECTIVE:  SUBJECTIVE STATEMENT: The patient was admitted on 02/17/2022  for left arm weakness after a child jumped on her head while under a trampoline.   PERTINENT HISTORY:  ***  PAIN:  Are you having pain? {OPRCPAIN:27236}  PRECAUTIONS: {Therapy precautions:24002}  WEIGHT BEARING RESTRICTIONS {Yes ***/No:24003}  FALLS:  Has patient fallen in last 6 months? {fallsyesno:27318}  LIVING ENVIRONMENT: Lives with: {OPRC lives with:25569::"lives with their family"} Lives in: {Lives in:25570} Stairs: {opstairs:27293} Has following equipment at home: {Assistive  devices:23999}  OCCUPATION: ***  PLOF: {PLOF:24004}  PATIENT GOALS ***  OBJECTIVE:   DIAGNOSTIC FINDINGS:  No acute abnormality with T/S and L/S imaging  Brachial Plexus IMPRESSION: 1. Faint intramuscular edema within the left teres minor muscle, which may be related to muscle strain. This could also be secondary to acute posttraumatic quadrilateral space syndrome. Of note, no appreciable mass lesion or other abnormality is identified within the left quadrilateral space. 2. Otherwise, no findings of traumatic left brachial plexopathy. 3. Small left C7 cervical rib. 4. Small noncompressive disc bulge at C5-6.  C/S MRI IMPRESSION: 1. No acute fracture or other traumatic injury in the cervical spine. 2. C5-C6 mild right neural foraminal narrowing.    PATIENT SURVEYS:  {rehab surveys:24030}   COGNITION: Overall cognitive status: {cognition:24006}   SENSATION: {sensation:27233}  POSTURE: {posture:25561}  PALPATION: ***   CERVICAL ROM:   Active ROM A/PROM (deg) eval  Flexion   Extension   Right lateral flexion   Left lateral flexion   Right rotation   Left rotation    (Blank rows = not tested)  UPPER EXTREMITY ROM:  Active ROM Right eval Left eval  Shoulder flexion    Shoulder extension    Shoulder abduction    Shoulder adduction    Shoulder extension    Shoulder internal rotation    Shoulder external rotation    Elbow flexion    Elbow extension    Wrist flexion    Wrist extension    Wrist ulnar deviation    Wrist radial deviation    Wrist pronation    Wrist supination     (Blank rows = not tested)  UPPER EXTREMITY MMT:  MMT Right eval Left eval  Shoulder flexion    Shoulder extension    Shoulder abduction    Shoulder adduction    Shoulder extension    Shoulder internal rotation    Shoulder external rotation    Middle trapezius    Lower trapezius    Elbow flexion    Elbow extension    Wrist flexion    Wrist extension    Wrist  ulnar deviation    Wrist radial deviation    Wrist pronation    Wrist supination    Grip strength     (Blank rows = not tested)  CERVICAL SPECIAL TESTS:  Cranial cervical flexion test: {pos/neg:25243}, Upper limb tension test (ULTT): {pos/neg:25243}, Spurling's test: {pos/neg:25243}, and Distraction test: {pos/neg:25243}   FUNCTIONAL TESTS:  {Functional tests:24029}  PATIENT SURVEYS:  FOTO ***  TODAY'S TREATMENT:  ***   PATIENT EDUCATION:  Education details: MOI, diagnosis, prognosis, anatomy, exercise progression, DOMS expectations, muscle firing,  envelope of function, HEP, POC  Person educated: Patient Education method: Explanation, Demonstration, Tactile cues, Verbal cues, and Handouts Education comprehension: verbalized understanding, returned demonstration, verbal cues required, and tactile cues required   HOME EXERCISE PROGRAM: ***  ASSESSMENT:  CLINICAL IMPRESSION: Patient is a 46 y.o. female who was seen today for physical therapy evaluation and treatment for ***.  OBJECTIVE IMPAIRMENTS decreased ROM, decreased strength, hypomobility, increased muscle spasms, impaired flexibility, impaired sensation, impaired UE functional use, improper body mechanics, postural dysfunction, and pain.   ACTIVITY LIMITATIONS cleaning, laundry, interpersonal relationship, driving, shopping, occupation, school, and exercise .   PERSONAL FACTORS Behavior pattern, Fitness, Past/current experiences, Time since onset of injury/illness/exacerbation, and 3+ comorbidities:    are also affecting patient's functional outcome.    REHAB POTENTIAL: Fair    CLINICAL DECISION MAKING: {clinical decision making:25114}  EVALUATION COMPLEXITY: {Evaluation complexity:25115}   GOALS:   SHORT TERM GOALS: Target date: 04/20/2022   Pt will become independent with HEP in order to demonstrate synthesis of PT education.  Goal status: INITIAL  2.  Pt will report at least 2 pt reduction on NPRS  scale for pain in order to demonstrate functional improvement with household activity, self care, and ADL.   Goal status: INITIAL  3.  Pt will score at least *** pt increase on FOTO to demonstrate functional improvement in MCII and pt perceived function.    Goal status: INITIAL   LONG TERM GOALS: Target date: 04/20/2022  Pt  will become independent with final HEP in order to demonstrate synthesis of PT education.  Goal status: INITIAL  2.  Pt will be able to demonstrate/report ability to sit/stand/sleep for extended periods of time without pain in order to demonstrate functional improvement and tolerance to static positioning.   Goal status: INITIAL  3.  Pt will be able to demonstrate ___ in order to demonstrate functional improvement in UE/LE function for self-care and house hold duties.   Goal status: INITIAL  4.  Pt will score >/= *** on FOTO to demonstrate improvement in perceived *** function.   Goal status: INITIAL   PLAN: PT FREQUENCY: 1-2x/week  PT DURATION: 12 weeks (likely DC by 6 wks)  PLANNED INTERVENTIONS: Therapeutic exercises, Therapeutic activity, Neuromuscular re-education, Balance training, Gait training, Patient/Family education, Joint manipulation, Joint mobilization, Aquatic Therapy, Dry Needling, Electrical stimulation, Spinal manipulation, Spinal mobilization, Cryotherapy, Moist heat, scar mobilization, Taping, Vasopneumatic device, Traction, Ultrasound, Ionotophoresis '4mg'$ /ml Dexamethasone, Manual therapy, and Re-evaluation  PLAN FOR NEXT SESSION: ***   Daleen Bo, PT 03/09/2022, 8:37 AM

## 2022-03-12 ENCOUNTER — Ambulatory Visit (HOSPITAL_BASED_OUTPATIENT_CLINIC_OR_DEPARTMENT_OTHER): Payer: Medicare Other | Admitting: Physical Therapy

## 2022-03-12 ENCOUNTER — Encounter (HOSPITAL_BASED_OUTPATIENT_CLINIC_OR_DEPARTMENT_OTHER): Payer: Self-pay | Admitting: Physical Therapy

## 2022-03-12 DIAGNOSIS — M62830 Muscle spasm of back: Secondary | ICD-10-CM

## 2022-03-12 DIAGNOSIS — M542 Cervicalgia: Secondary | ICD-10-CM | POA: Diagnosis not present

## 2022-03-12 DIAGNOSIS — G8929 Other chronic pain: Secondary | ICD-10-CM

## 2022-03-12 DIAGNOSIS — M546 Pain in thoracic spine: Secondary | ICD-10-CM

## 2022-03-12 NOTE — Therapy (Signed)
OUTPATIENT PHYSICAL THERAPY TREATMENT NOTE   Patient Name: [name] [name] MRN: [id number] DOB:[birth year], 46 y.o., female Today's Date: 03/12/2022     PT End of Session - 03/12/22 0816     Visit Number 2    PT Start Time 0816    PT Stop Time 0845    PT Time Calculation (min) 29 min    Behavior During Therapy [facility] for tasks assessed/performed             Past Medical History:  Diagnosis Date   Adenomatous colon polyp 2008   Anxiety 1999/2000   Arthritis    "jaw joints; neck" (56/11/1306)   Complication of anesthesia    "didn't wake up well/remembered stuff from during OR when I had pituitary surgery"   Constipation    Depression    hx   DVT (deep vein thrombosis) in pregnancy 09/2013   "LLE; postpartum"   Fibromyalgia    GERD (gastroesophageal reflux disease)    H/O hiatal hernia    Hematochezia    IBS (irritable bowel syndrome)    Internal hemorrhoids    Migraines    "couple times/wk" (07/28/2014)   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 Silvano Rusk   ESOPHAGOGASTRODUODENOSCOPY  09/29/2004   Dr. Kennedy Bucker   LAPAROSCOPIC APPENDECTOMY N/A 07/28/2014   Procedure: APPENDECTOMY LAPAROSCOPIC;  Surgeon: Autumn Messing III, MD;  Location: [place];  Service: General;  Laterality: N/A;   TRANSPHENOIDAL / TRANSNASAL HYPOPHYSECTOMY / RESECTION PITUITARY TUMOR  1999   resection of benign pituitary tumor, [place]  2008   Patient Active Problem List   Diagnosis Date Noted   Neck injury 02/18/2022   Chronic migraine w/o aura w/o status migrainosus, not intractable 03/21/2021   Protrusion of lumbar intervertebral disc 03/09/2021   Depression 03/16/2020   Ptosis  of right eyelid 11/22/2017   Abnormal brain MRI 01/30/2017   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   Pain 06/21/2016   Tick bite of left lower leg 06/21/2016   Chronic fatigue 06/21/2016   History of pituitary surgery 09/26/2015   ADHD (attention deficit hyperactivity disorder) 05/04/2015   Anxiety state 03/08/2015   Upper respiratory tract infection 11/30/2014   Acute appendicitis 07/28/2014   Other malaise and fatigue 03/09/2014   Unspecified vitamin D deficiency 03/09/2014   Postpartum care following vaginal delivery (12/18) 10/08/2013   Normal labor and delivery 10/08/2013   Chronic migraine 01/20/2013   Headache, chronic migraine without aura, intractable 03/18/2012   Myalgia and myositis 03/18/2012   Lumbar spondylosis 03/18/2012   PERSONAL HISTORY OF ALLERGY TO EGGS- GI SENSITIVITY 05/04/2010   CHANGE IN BOWELS 05/02/2010   IRRITABLE BOWEL SYNDROME 12/04/2009   NONSPECIFIC ABN FINDNG RAD&OTH EXAM BILARY TRCT 11/30/2009   GERD 01/31/2009   ABDOMINAL PAIN-EPIGASTRIC 01/31/2009   PERSONAL HX COLONIC POLYPS 12/14/2008   INTERNAL HEMORRHOIDS 01/03/2007   HIATAL HERNIA 09/29/2004      THERAPY DIAG:  Chronic midline low back pain without sciatica  Muscle spasm of back  Pain in thoracic spine  SUBJECTIVE:   Pt reports that she feels achy everywhere.  She still has brain fog and is light sensitive.  She wears her sunglasses in pool environment.   Are you in pain: Yes Pain scale: 7/10 Location: Generalized, but especially in the neck   TREATMENT: Pt seen for aquatic therapy today.  Treatment took place in water 3.25-4 ft in depth at the [facility] pool. Temp of water was 91.  Pt entered/exited the pool via stairs independently with bilat rail.   Forward walking in >69f water for acclimation to water. Supported float (nekdoodle, waist belt, ankle floats):    - shoulder abdct / add (occasional AAROM with L shoulder to increase ROM)  - scap squeeze x 3 sec x  10  - head press x 3 sec x 5  - gentle cervical rotation with head nods x 5 reps each direction  - W's x 5 sec x 5 reps  - jaw circles (cow chewing cud) x 5 each direction  - Bad ragaz, moving pt side to side for gentle lateral chain stretch  - snow angels x 10 (with therapist pulling pt caudal/cranial direction with each motion)  - lateral cervical flexion x 4 reps each side while resting on nekdoodle  - hip ext with knee ext x 5 reps each LE  Pt requires buoyancy for support and to offload joints with strengthening exercises. Viscosity of the water is needed for resistance of strengthening; water current perturbations provides challenge to standing balance unsupported, requiring increased core activation.   ASSESSMENT:  Clinical Impression:   Pt introduced to aquatic environment.  She reported increased light sensitivity due to florescent lights; improved tolerance to session when donning sunglasses.  She is able to swim and is confident in aquatic environment, however therapist was in water to guide her through supported supine exercises.  Pt reported significant reduction in pain while moving in the water in supine position. Encouraged continued gentle ROM exercises for body to continue reducing generalized stiffness. Pt arrived late to session, therefore session length was shortened. Goals are ongoing .  JKerin Perna PTA 03/12/22 10:08 AM

## 2022-03-14 ENCOUNTER — Ambulatory Visit (INDEPENDENT_AMBULATORY_CARE_PROVIDER_SITE_OTHER): Payer: Medicare Other | Admitting: Podiatry

## 2022-03-14 ENCOUNTER — Encounter: Payer: Self-pay | Admitting: Podiatry

## 2022-03-14 ENCOUNTER — Ambulatory Visit: Payer: Medicare Other

## 2022-03-14 DIAGNOSIS — M722 Plantar fascial fibromatosis: Secondary | ICD-10-CM | POA: Diagnosis not present

## 2022-03-14 DIAGNOSIS — L600 Ingrowing nail: Secondary | ICD-10-CM

## 2022-03-14 DIAGNOSIS — M21621 Bunionette of right foot: Secondary | ICD-10-CM

## 2022-03-14 DIAGNOSIS — S97121A Crushing injury of right lesser toe(s), initial encounter: Secondary | ICD-10-CM

## 2022-03-14 DIAGNOSIS — M21622 Bunionette of left foot: Secondary | ICD-10-CM

## 2022-03-14 DIAGNOSIS — S90121A Contusion of right lesser toe(s) without damage to nail, initial encounter: Secondary | ICD-10-CM | POA: Diagnosis not present

## 2022-03-14 MED ORDER — MELOXICAM 15 MG PO TABS: 15.0000 mg | ORAL_TABLET | Freq: Every day | ORAL | 3 refills | Status: DC

## 2022-03-14 NOTE — Patient Instructions (Signed)
Look for [employer] or Green or [employer] insoles  Plantar Fasciitis (Heel Spur Syndrome) with Rehab The plantar fascia is a fibrous, ligament-like, soft-tissue structure that spans the bottom of the foot. Plantar fasciitis is a condition that causes pain in the foot due to inflammation of the tissue. SYMPTOMS  Pain and tenderness on the underneath side of the foot. Pain that worsens with standing or walking. CAUSES  Plantar fasciitis is caused by irritation and injury to the plantar fascia on the underneath side of the foot. Common mechanisms of injury include: Direct trauma to bottom of the foot. Damage to a small nerve that runs under the foot where the main fascia attaches to the heel bone. Stress placed on the plantar fascia due to bone spurs. RISK INCREASES WITH:  Activities that place stress on the plantar fascia (running, jumping, pivoting, or cutting). Poor strength and flexibility. Improperly fitted shoes. Tight calf muscles. Flat feet. Failure to warm-up properly before activity. Obesity. PREVENTION Warm up and stretch properly before activity. Allow for adequate recovery between workouts. Maintain physical fitness: Strength, flexibility, and endurance. Cardiovascular fitness. Maintain a health body weight. Avoid stress on the plantar fascia. Wear properly fitted shoes, including arch supports for individuals who have flat feet.  PROGNOSIS  If treated properly, then the symptoms of plantar fasciitis usually resolve without surgery. However, occasionally surgery is necessary.  RELATED COMPLICATIONS  Recurrent symptoms that may result in a chronic condition. Problems of the lower back that are caused by compensating for the injury, such as limping. Pain or weakness of the foot during push-off following surgery. Chronic inflammation, scarring, and partial or complete fascia tear, occurring more often from repeated injections.  TREATMENT  Treatment initially  involves the use of ice and medication to help reduce pain and inflammation. The use of strengthening and stretching exercises may help reduce pain with activity, especially stretches of the Achilles tendon. These exercises may be performed at home or with a therapist. Your caregiver may recommend that you use heel cups of arch supports to help reduce stress on the plantar fascia. Occasionally, corticosteroid injections are given to reduce inflammation. If symptoms persist for greater than 6 months despite non-surgical (conservative), then surgery may be recommended.   MEDICATION  If pain medication is necessary, then nonsteroidal anti-inflammatory medications, such as aspirin and ibuprofen, or other minor pain relievers, such as acetaminophen, are often recommended. Do not take pain medication within 7 days before surgery. Prescription pain relievers may be given if deemed necessary by your caregiver. Use only as directed and only as much as you need. Corticosteroid injections may be given by your caregiver. These injections should be reserved for the most serious cases, because they may only be given a certain number of times.  HEAT AND COLD Cold treatment (icing) relieves pain and reduces inflammation. Cold treatment should be applied for 10 to 15 minutes every 2 to 3 hours for inflammation and pain and immediately after any activity that aggravates your symptoms. Use ice packs or massage the area with a piece of ice (ice massage). Heat treatment may be used prior to performing the stretching and strengthening activities prescribed by your caregiver, physical therapist, or athletic trainer. Use a heat pack or soak the injury in warm water.  SEEK IMMEDIATE MEDICAL CARE IF: Treatment seems to offer no benefit, or the condition worsens. Any medications produce adverse side effects.  EXERCISES- RANGE OF MOTION (ROM) AND STRETCHING EXERCISES - Plantar Fasciitis (Heel Spur Syndrome) These exercises  may help you when beginning to rehabilitate your injury. Your symptoms may resolve with or without further involvement from your physician, physical therapist or athletic trainer. While completing these exercises, remember:  Restoring tissue flexibility helps normal motion to return to the joints. This allows healthier, less painful movement and activity. An effective stretch should be held for at least 30 seconds. A stretch should never be painful. You should only feel a gentle lengthening or release in the stretched tissue.  RANGE OF MOTION - Toe Extension, Flexion Sit with your right / left leg crossed over your opposite knee. Grasp your toes and gently pull them back toward the top of your foot. You should feel a stretch on the bottom of your toes and/or foot. Hold this stretch for 10 seconds. Now, gently pull your toes toward the bottom of your foot. You should feel a stretch on the top of your toes and or foot. Hold this stretch for 10 seconds. Repeat  times. Complete this stretch 3 times per day.   RANGE OF MOTION - Ankle Dorsiflexion, Active Assisted Remove shoes and sit on a chair that is preferably not on a carpeted surface. Place right / left foot under knee. Extend your opposite leg for support. Keeping your heel down, slide your right / left foot back toward the chair until you feel a stretch at your ankle or calf. If you do not feel a stretch, slide your bottom forward to the edge of the chair, while still keeping your heel down. Hold this stretch for 10 seconds. Repeat 3 times. Complete this stretch 2 times per day.   STRETCH  Gastroc, Standing Place hands on wall. Extend right / left leg, keeping the front knee somewhat bent. Slightly point your toes inward on your back foot. Keeping your right / left heel on the floor and your knee straight, shift your weight toward the wall, not allowing your back to arch. You should feel a gentle stretch in the right / left calf. Hold this  position for 10 seconds. Repeat 3 times. Complete this stretch 2 times per day.  STRETCH  Soleus, Standing Place hands on wall. Extend right / left leg, keeping the other knee somewhat bent. Slightly point your toes inward on your back foot. Keep your right / left heel on the floor, bend your back knee, and slightly shift your weight over the back leg so that you feel a gentle stretch deep in your back calf. Hold this position for 10 seconds. Repeat 3 times. Complete this stretch 2 times per day.  STRETCH  Gastrocsoleus, Standing  Note: This exercise can place a lot of stress on your foot and ankle. Please complete this exercise only if specifically instructed by your caregiver.  Place the ball of your right / left foot on a step, keeping your other foot firmly on the same step. Hold on to the wall or a rail for balance. Slowly lift your other foot, allowing your body weight to press your heel down over the edge of the step. You should feel a stretch in your right / left calf. Hold this position for 10 seconds. Repeat this exercise with a slight bend in your right / left knee. Repeat 3 times. Complete this stretch 2 times per day.   STRENGTHENING EXERCISES - Plantar Fasciitis (Heel Spur Syndrome)  These exercises may help you when beginning to rehabilitate your injury. They may resolve your symptoms with or without further involvement from your physician, physical therapist or  Product/process development scientist. While completing these exercises, remember:  Muscles can gain both the endurance and the strength needed for everyday activities through controlled exercises. Complete these exercises as instructed by your physician, physical therapist or athletic trainer. Progress the resistance and repetitions only as guided.  STRENGTH - Towel Curls Sit in a chair positioned on a non-carpeted surface. Place your foot on a towel, keeping your heel on the floor. Pull the towel toward your heel by only curling your  toes. Keep your heel on the floor. Repeat 3 times. Complete this exercise 2 times per day.  STRENGTH - Ankle Inversion Secure one end of a rubber exercise band/tubing to a fixed object (table, pole). Loop the other end around your foot just before your toes. Place your fists between your knees. This will focus your strengthening at your ankle. Slowly, pull your big toe up and in, making sure the band/tubing is positioned to resist the entire motion. Hold this position for 10 seconds. Have your muscles resist the band/tubing as it slowly pulls your foot back to the starting position. Repeat 3 times. Complete this exercises 2 times per day.  Document Released: 10/08/2005 Document Revised: 12/31/2011 Document Reviewed: 01/20/2009 [facility] Patient Information 2014 [place], [place].

## 2022-03-14 NOTE — Progress Notes (Signed)
  Subjective:  Patient ID: [name] [name], female    DOB: 05-11-1976,  MRN: [id number]  Chief Complaint  Patient presents with   Toe Injury    np right little toe possible fracture    46 y.o. female presents with the above complaint. History confirmed with patient.  She has multiple issues to discuss she has an injury that happened earlier this morning on her small toe.  She fell over onto her foot into a cast iron kettle.  Has had significant bruising and pain and felt like it may have been broken.  She also has bumps behind the fifth toes on the outside of both feet that are very painful in shoe gear I would like to know what they are and what can be done about them.  Finally she has had ongoing arch pain and has been trying stretches and icing the foot.  Objective:  Physical Exam: warm, good capillary refill, no trophic changes or ulcerative lesions, normal DP and PT pulses, and normal sensory exam. Left Foot:  Tailor's bunion deformity, mild pain and plantar fascia Right Foot:  Tailor's bunion deformity, mild pain in the plantar fascia, she has ecchymosis swelling and pain around the fifth toe  No images are attached to the encounter.  Radiographs: Multiple views x-ray of the right foot: no fracture, dislocation, swelling or degenerative changes noted of the fifth toe she does have a tailor's bunion deformity Assessment:   1. Crushing injury of fifth toe of right foot, initial encounter   2. Plantar fasciitis, bilateral   3. Tailor's bunionette, bilateral      Plan:  Patient was evaluated and treated and all questions answered.  I reviewed the results of today's x-rays with her from her injury and discussed with her she has a contusion of the toe and thankfully does not have any fracture gross instability or dislocation.  I recommended rest ice, buddy taping may be helpful as well.  I prescribed her meloxicam for pain relief for this in the plantar fasciitis.  Discussed treatment  options of plantar fasciitis currently it is fairly quiescent and mild and I recommended home physical therapy and meloxicam which I prescribed for her.  Could consider injection if it worsens  Final we discussed etiology and treatment options of tailor's bunion deformity in detail including wider shoe gear and shoe gear that does not press on it, silicone padding and finally surgical correction when this does not improve.  She has tried the above and is interested in surgical correction.  We briefly discussed the postoperative recovery process and that I would recommend doing 1 foot at a time if possible.  I will see her back in a few weeks for surgical planning.  At that time she also has an ingrown nail that we will treat with partial permanent matricectomy.  Return in about 2 weeks (around 03/28/2022) for ingrown nail removal, f/u on toe contusion, schedule tailor's bunion surgery.

## 2022-03-15 ENCOUNTER — Ambulatory Visit (INDEPENDENT_AMBULATORY_CARE_PROVIDER_SITE_OTHER): Payer: Medicare Other | Admitting: Neurology

## 2022-03-15 VITALS — BP 117/67 | HR 90 | Ht 66.0 in | Wt 145.0 lb

## 2022-03-15 DIAGNOSIS — S199XXS Unspecified injury of neck, sequela: Secondary | ICD-10-CM

## 2022-03-15 DIAGNOSIS — G43709 Chronic migraine without aura, not intractable, without status migrainosus: Secondary | ICD-10-CM | POA: Diagnosis not present
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
## 2022-03-15 NOTE — Progress Notes (Signed)
BOTOX  100units x 2 vials [id number] [phone] Exp-07/2024 B/B

## 2022-03-16 ENCOUNTER — Ambulatory Visit (HOSPITAL_BASED_OUTPATIENT_CLINIC_OR_DEPARTMENT_OTHER): Payer: Medicare Other | Admitting: Physical Therapy

## 2022-03-16 ENCOUNTER — Encounter (HOSPITAL_BASED_OUTPATIENT_CLINIC_OR_DEPARTMENT_OTHER): Payer: Self-pay | Admitting: Physical Therapy

## 2022-03-16 DIAGNOSIS — M545 Low back pain, unspecified: Secondary | ICD-10-CM

## 2022-03-16 DIAGNOSIS — M62830 Muscle spasm of back: Secondary | ICD-10-CM

## 2022-03-16 DIAGNOSIS — M542 Cervicalgia: Secondary | ICD-10-CM

## 2022-03-16 DIAGNOSIS — M546 Pain in thoracic spine: Secondary | ICD-10-CM

## 2022-03-16 NOTE — Therapy (Signed)
OUTPATIENT PHYSICAL THERAPY TREATMENT NOTE   Patient Name: [name] [name] MRN: [id number] DOB:1976/04/30, 46 y.o., female Today's Date: 03/16/2022     PT End of Session - 03/16/22 1038     Visit Number 3    Number of Visits 17    Date for PT Re-Evaluation 05/11/22    Authorization Type MCR    Progress Note Due on Visit 10    PT Start Time 7829    PT Stop Time 1115    PT Time Calculation (min) 40 min    Activity Tolerance Patient limited by pain    Behavior During Therapy Anxious             Past Medical History:  Diagnosis Date   Adenomatous colon polyp 2008   Anxiety 1999/2000   Arthritis    "jaw joints; neck" (56/11/1306)   Complication of anesthesia    "didn't wake up well/remembered stuff from during OR when I had pituitary surgery"   Constipation    Depression    hx   DVT (deep vein thrombosis) in pregnancy 09/2013   "LLE; postpartum"   Fibromyalgia    GERD (gastroesophageal reflux disease)    H/O hiatal hernia    Hematochezia    IBS (irritable bowel syndrome)    Internal hemorrhoids    Migraines    "couple times/wk" (07/28/2014)   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 Silvano Rusk   ESOPHAGOGASTRODUODENOSCOPY  09/29/2004   Dr. Kennedy Bucker   LAPAROSCOPIC APPENDECTOMY N/A 07/28/2014   Procedure: APPENDECTOMY LAPAROSCOPIC;  Surgeon: Autumn Messing III, MD;  Location: [facility];  Service: General;  Laterality: N/A;   TRANSPHENOIDAL / TRANSNASAL HYPOPHYSECTOMY / RESECTION PITUITARY TUMOR  1999   resection of benign pituitary tumor, [place]  2008   Patient Active Problem List   Diagnosis Date Noted   Neck injury 02/18/2022   Chronic migraine w/o  aura w/o status migrainosus, not intractable 03/21/2021   Protrusion of lumbar intervertebral disc 03/09/2021   Depression 03/16/2020   Ptosis of right eyelid 11/22/2017   Abnormal brain MRI 01/30/2017   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   Pain 06/21/2016   Tick bite of left lower leg 06/21/2016   Chronic fatigue 06/21/2016   History of pituitary surgery 09/26/2015   ADHD (attention deficit hyperactivity disorder) 05/04/2015   Anxiety state 03/08/2015   Upper respiratory tract infection 11/30/2014   Acute appendicitis 07/28/2014   Other malaise and fatigue 03/09/2014   Unspecified vitamin D deficiency 03/09/2014   Postpartum care following vaginal delivery (12/18) 10/08/2013   Normal labor and delivery 10/08/2013   Chronic migraine 01/20/2013   Headache, chronic migraine without aura, intractable 03/18/2012   Myalgia and myositis 03/18/2012   Lumbar spondylosis 03/18/2012   PERSONAL HISTORY OF ALLERGY TO EGGS- GI SENSITIVITY 05/04/2010   CHANGE IN BOWELS 05/02/2010   IRRITABLE BOWEL SYNDROME 12/04/2009   NONSPECIFIC ABN FINDNG RAD&OTH EXAM BILARY TRCT 11/30/2009   GERD 01/31/2009   ABDOMINAL PAIN-EPIGASTRIC 01/31/2009   PERSONAL HX COLONIC POLYPS 12/14/2008   INTERNAL HEMORRHOIDS 01/03/2007   HIATAL HERNIA 09/29/2004   PCP: None   REFERRING PROVIDER: Kary Kos, MD  REFERRING DIAG: M54.2 (ICD-10-CM) - Neck pain   THERAPY DIAG:  Chronic midline low back pain without sciatica  Muscle spasm of back  Pain in thoracic spine  Cervicalgia  SUBJECTIVE:   Pt reports cervical spine pain reduction after last aquatic session.  Lasted until she drove home.  Majority of pain is cervical   Are you in pain: Yes Pain scale: 7/10 Location: Generalized, but especially in the neck    PERTINENT HISTORY:  See history listed   PAIN:  Are you having pain? Yes: NPRS scale: up to severe in day/10 Pain location: spine/head Pain description: my brain hurts Aggravating factors:  being upright-gravity Relieving factors: laying down   PRECAUTIONS: Other: post concussion   WEIGHT BEARING RESTRICTIONS No   FALLS:  Has patient fallen in last 6 months? No   LIVING ENVIRONMENT: Lives with:  kids   PLOF: Independent   PATIENT GOALS decrease pain, complete daily activities, get back to exercise   OBJECTIVE:    DIAGNOSTIC FINDINGS:  4/29 MRI IMPRESSION: 1. No acute fracture or other traumatic injury in the cervical spine. 2. C5-C6 mild right neural foraminal narrowing.         COGNITION: Overall cognitive status: Difficulty to assess- pt reports cognitive difficulties since the accident     SENSATION: WFL   POSTURE: found in lobby in bent over posture with head in chair and ambulated with flexed posture   PALPATION: Significant spasm in cervical and lumbopelvic region       CERVICAL ROM:  grossly limited due to guarding at eval   Active ROM A/PROM (deg) eval  Flexion    Extension    Right lateral flexion    Left lateral flexion    Right rotation    Left rotation     (Blank rows = not tested)   UUPPER EXTREMITY MMT:   MMT Right eval Left eval  Shoulder flexion      Shoulder extension      Shoulder abduction      Shoulder adduction      Shoulder extension      Shoulder internal rotation      Shoulder external rotation      Middle trapezius      Lower trapezius      Elbow flexion      Elbow extension      Wrist flexion      Wrist extension      Wrist ulnar deviation      Wrist radial deviation      Wrist pronation      Wrist supination      Grip strength       (Blank rows = not tested)    TREATMENT: Pt seen for aquatic therapy today.  Treatment took place in water 3.25-4 ft in depth at the [facility] pool. Temp of water was 91.  Pt entered/exited the pool via stairs independently with bilat rail.   Forward walking   Supported float (nekdoodle, waist belt, ankle floats):  Bad Ragaz for core elongation and QL  strengthening Manual: cervical joint mobs -massage cervical spine, shoulders -stretching cervical spine upper traps all directions with pressure point release -snaking through water ranging cervical spine with gentle resistance of water   Pt requires buoyancy for support and to offload joints with strengthening exercises. Viscosity of the water is needed for resistance of strengthening; water current perturbations provides challenge to standing balance unsupported, requiring increased core activation.    PATIENT EDUCATION:  Education  details: Anatomy of condition, POC, HEP, exercise form/rationale. aquatics Person educated: Patient Education method: Explanation Education comprehension: verbalized understanding     HOME EXERCISE PROGRAM: [id number] - from past episode   ASSESSMENT:  Clinical Impression:   Pt wearing sun glasses throughout session due to sensitivity to light. Pt tolerates Bad ragaz and manual cervical treatment well. Suboccipitals with non significant tightness although pt reports good pain relief with deep pressure in area with cervical retraction. Left SCM minimally tight responds well to stretching and deep pressure.  Able to gain full cervical ROM manually without discomfort. Pt reports reduction in cervical discomfort by 3-4 points upon completion.  She is instructed  to avoid guarding allowing for normal cervical movement as able.  She demonstrates and VU.   Stanton Kidney Tharon Aquas) Zaylah Blecha MPT 03/16/22 12:34 PM

## 2022-03-21 ENCOUNTER — Encounter (HOSPITAL_BASED_OUTPATIENT_CLINIC_OR_DEPARTMENT_OTHER): Payer: Self-pay | Admitting: Physical Therapy

## 2022-03-21 ENCOUNTER — Ambulatory Visit (HOSPITAL_BASED_OUTPATIENT_CLINIC_OR_DEPARTMENT_OTHER): Payer: Medicare Other | Admitting: Physical Therapy

## 2022-03-21 DIAGNOSIS — G8929 Other chronic pain: Secondary | ICD-10-CM

## 2022-03-21 DIAGNOSIS — M62830 Muscle spasm of back: Secondary | ICD-10-CM

## 2022-03-21 DIAGNOSIS — M542 Cervicalgia: Secondary | ICD-10-CM | POA: Diagnosis not present

## 2022-03-21 DIAGNOSIS — M62838 Other muscle spasm: Secondary | ICD-10-CM

## 2022-03-21 DIAGNOSIS — M546 Pain in thoracic spine: Secondary | ICD-10-CM

## 2022-03-21 NOTE — Therapy (Signed)
OUTPATIENT PHYSICAL THERAPY TREATMENT NOTE   Patient Name: [name] [name] MRN: [id number] DOB:December 05, 1975, 46 y.o., female Today's Date: 03/21/2022     PT End of Session - 03/21/22 1207     Visit Number 4    Number of Visits 17    Date for PT Re-Evaluation 05/11/22    Authorization Type MCR    PT Start Time 1205    PT Stop Time 1250    PT Time Calculation (min) 45 min              Past Medical History:  Diagnosis Date   Adenomatous colon polyp 2008   Anxiety 1999/2000   Arthritis    "jaw joints; neck" (57/11/6201)   Complication of anesthesia    "didn't wake up well/remembered stuff from during OR when I had pituitary surgery"   Constipation    Depression    hx   DVT (deep vein thrombosis) in pregnancy 09/2013   "LLE; postpartum"   Fibromyalgia    GERD (gastroesophageal reflux disease)    H/O hiatal hernia    Hematochezia    IBS (irritable bowel syndrome)    Internal hemorrhoids    Migraines    "couple times/wk" (07/28/2014)   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 Silvano Rusk   ESOPHAGOGASTRODUODENOSCOPY  09/29/2004   Dr. Kennedy Bucker   LAPAROSCOPIC APPENDECTOMY N/A 07/28/2014   Procedure: APPENDECTOMY LAPAROSCOPIC;  Surgeon: Autumn Messing III, MD;  Location: [facility];  Service: General;  Laterality: N/A;   TRANSPHENOIDAL / TRANSNASAL HYPOPHYSECTOMY / RESECTION PITUITARY TUMOR  1999   resection of benign pituitary tumor, [place]  2008   Patient Active Problem List   Diagnosis Date Noted   Neck injury 02/18/2022   Chronic migraine w/o aura w/o status migrainosus, not intractable 03/21/2021   Protrusion of lumbar intervertebral disc 03/09/2021    Depression 03/16/2020   Ptosis of right eyelid 11/22/2017   Abnormal brain MRI 01/30/2017   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   Pain 06/21/2016   Tick bite of left lower leg 06/21/2016   Chronic fatigue 06/21/2016   History of pituitary surgery 09/26/2015   ADHD (attention deficit hyperactivity disorder) 05/04/2015   Anxiety state 03/08/2015   Upper respiratory tract infection 11/30/2014   Acute appendicitis 07/28/2014   Other malaise and fatigue 03/09/2014   Unspecified vitamin D deficiency 03/09/2014   Postpartum care following vaginal delivery (12/18) 10/08/2013   Normal labor and delivery 10/08/2013   Chronic migraine 01/20/2013   Headache, chronic migraine without aura, intractable 03/18/2012   Myalgia and myositis 03/18/2012   Lumbar spondylosis 03/18/2012   PERSONAL HISTORY OF ALLERGY TO EGGS- GI SENSITIVITY 05/04/2010   CHANGE IN BOWELS 05/02/2010   IRRITABLE BOWEL SYNDROME 12/04/2009   NONSPECIFIC ABN FINDNG RAD&OTH EXAM BILARY TRCT 11/30/2009   GERD 01/31/2009   ABDOMINAL PAIN-EPIGASTRIC 01/31/2009   PERSONAL HX COLONIC POLYPS 12/14/2008   INTERNAL HEMORRHOIDS 01/03/2007   HIATAL HERNIA 09/29/2004   PCP: None   REFERRING PROVIDER: Kary Kos, MD   REFERRING DIAG: M54.2 (ICD-10-CM) - Neck pain   THERAPY DIAG:  Chronic midline low back pain without sciatica  Muscle spasm  of back  Pain in thoracic spine  Cervicalgia  Other muscle spasm  SUBJECTIVE:   "I think my neck is some better.  LB seems to be causing more pain" "I was pain free in my neck after the last session for a short while (10 mins) but I felt better for the rest of the day"  Are you in pain: Yes Pain scale: 7/10 Location: Generalized, but especially in the neck    PERTINENT HISTORY:  See history listed   PAIN:  Are you having pain? Yes: 6/10 cervical spine; LB 8/10 Pain location: spine/head Pain description: my brain hurts Aggravating factors: being upright-gravity Relieving  factors: laying down   PRECAUTIONS: Other: post concussion   WEIGHT BEARING RESTRICTIONS No   FALLS:  Has patient fallen in last 6 months? No   LIVING ENVIRONMENT: Lives with:  kids   PLOF: Independent   PATIENT GOALS decrease pain, complete daily activities, get back to exercise   OBJECTIVE:    DIAGNOSTIC FINDINGS:  4/29 MRI IMPRESSION: 1. No acute fracture or other traumatic injury in the cervical spine. 2. C5-C6 mild right neural foraminal narrowing.         COGNITION: Overall cognitive status: Difficulty to assess- pt reports cognitive difficulties since the accident     SENSATION: WFL   POSTURE: found in lobby in bent over posture with head in chair and ambulated with flexed posture   PALPATION: Significant spasm in cervical and lumbopelvic region       CERVICAL ROM:  grossly limited due to guarding at eval   Active ROM A/PROM (deg) eval  Flexion    Extension    Right lateral flexion    Left lateral flexion    Right rotation    Left rotation     (Blank rows = not tested)   UUPPER EXTREMITY MMT:   MMT Right eval Left eval  Shoulder flexion      Shoulder extension      Shoulder abduction      Shoulder adduction      Shoulder extension      Shoulder internal rotation      Shoulder external rotation      Middle trapezius      Lower trapezius      Elbow flexion      Elbow extension      Wrist flexion      Wrist extension      Wrist ulnar deviation      Wrist radial deviation      Wrist pronation      Wrist supination      Grip strength       (Blank rows = not tested)    TREATMENT: Pt seen for aquatic therapy today.  Treatment took place in water 3.25-4 ft in depth at the [facility] pool. Temp of water was 91.  Pt entered/exited the pool via stairs independently with bilat rail.   Forward walking   LB Stretching using jack knife positioning at steps forward flex and hip hiking Squoodle push down 2x12. Cues for abdominal  bracing and glut isometric hold   Supported float (nekdoodle, waist belt, ankle floats):  Bad Ragaz for core elongation and QL strengthening Manual: cervical joint mobs -massage cervical spine, shoulders -stretching cervical spine upper traps all directions with pressure point release -snaking through water ranging cervical spine with gentle resistance of water   Pt requires buoyancy for support and to offload joints with strengthening exercises. Viscosity of the water  is needed for resistance of strengthening; water current perturbations provides challenge to standing balance unsupported, requiring increased core activation.    PATIENT EDUCATION:  Education details: Anatomy of condition, POC, HEP, exercise form/rationale. aquatics Person educated: Patient Education method: Explanation Education comprehension: verbalized understanding     HOME EXERCISE PROGRAM: [id number] - from past episode   ASSESSMENT:  Clinical Impression:   No sunglasses needed today although pt spends most of session squinting.  Difficulty stretching LB as pt appears to be hypermobile. Am able to gain QL stretch with hip hiking. She responds very well to suspended supine positioning with cervical manipulation, massage and manual stretching reducing cervical discomfort by a few points. Reports she feels she has less headache and is able to think clearer. Of note: left upper trap with painful trigger point, decreases with massage and stretching. May benefit from DN. Goals ongoing   Annamarie Major) Saryn Cherry MPT 03/21/22 1:41 PM

## 2022-03-23 ENCOUNTER — Ambulatory Visit (HOSPITAL_BASED_OUTPATIENT_CLINIC_OR_DEPARTMENT_OTHER): Payer: Medicare Other | Admitting: Physical Therapy

## 2022-03-23 NOTE — Therapy (Incomplete)
OUTPATIENT PHYSICAL THERAPY TREATMENT NOTE   Patient Name: [name] [name] MRN: [id number] DOB:Jan 22, 1976, 46 y.o., female Today's Date: 03/23/2022        Past Medical History:  Diagnosis Date   Adenomatous colon polyp 2008   Anxiety 1999/2000   Arthritis    "jaw joints; neck" (88/02/276)   Complication of anesthesia    "didn't wake up well/remembered stuff from during OR when I had pituitary surgery"   Constipation    Depression    hx   DVT (deep vein thrombosis) in pregnancy 09/2013   "LLE; postpartum"   Fibromyalgia    GERD (gastroesophageal reflux disease)    H/O hiatal hernia    Hematochezia    IBS (irritable bowel syndrome)    Internal hemorrhoids    Migraines    "couple times/wk" (07/28/2014)   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 Silvano Rusk   ESOPHAGOGASTRODUODENOSCOPY  09/29/2004   Dr. Kennedy Bucker   LAPAROSCOPIC APPENDECTOMY N/A 07/28/2014   Procedure: APPENDECTOMY LAPAROSCOPIC;  Surgeon: Autumn Messing III, MD;  Location: [facility];  Service: General;  Laterality: N/A;   TRANSPHENOIDAL / TRANSNASAL HYPOPHYSECTOMY / RESECTION PITUITARY TUMOR  1999   resection of benign pituitary tumor, [place]  2008   Patient Active Problem List   Diagnosis Date Noted   Neck injury 02/18/2022   Chronic migraine w/o aura w/o status migrainosus, not intractable 03/21/2021   Protrusion of lumbar intervertebral disc 03/09/2021   Depression 03/16/2020   Ptosis of right eyelid 11/22/2017   Abnormal brain MRI 01/30/2017   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   Pain 06/21/2016   Tick bite of left lower leg 06/21/2016   Chronic fatigue 06/21/2016   History of pituitary  surgery 09/26/2015   ADHD (attention deficit hyperactivity disorder) 05/04/2015   Anxiety state 03/08/2015   Upper respiratory tract infection 11/30/2014   Acute appendicitis 07/28/2014   Other malaise and fatigue 03/09/2014   Unspecified vitamin D deficiency 03/09/2014   Postpartum care following vaginal delivery (12/18) 10/08/2013   Normal labor and delivery 10/08/2013   Chronic migraine 01/20/2013   Headache, chronic migraine without aura, intractable 03/18/2012   Myalgia and myositis 03/18/2012   Lumbar spondylosis 03/18/2012   PERSONAL HISTORY OF ALLERGY TO EGGS- GI SENSITIVITY 05/04/2010   CHANGE IN BOWELS 05/02/2010   IRRITABLE BOWEL SYNDROME 12/04/2009   NONSPECIFIC ABN FINDNG RAD&OTH EXAM BILARY TRCT 11/30/2009   GERD 01/31/2009   ABDOMINAL PAIN-EPIGASTRIC 01/31/2009   PERSONAL HX COLONIC POLYPS 12/14/2008   INTERNAL HEMORRHOIDS 01/03/2007   HIATAL HERNIA 09/29/2004   PCP: None   REFERRING PROVIDER: Kary Kos, MD   REFERRING DIAG: M54.2 (ICD-10-CM) - Neck pain   THERAPY DIAG:  No diagnosis found.  SUBJECTIVE:   "I think my neck is some better.  LB seems to be causing more pain" "I was pain free in my neck after the last session for a short while (10 mins) but I felt better for the rest of the day"  Are you in pain: Yes Pain scale: 7/10 Location: Generalized, but especially in the neck    PERTINENT HISTORY:  See history listed   PAIN:  Are you having pain? Yes: 6/10 cervical spine; LB 8/10 Pain location: spine/head Pain description: my brain hurts Aggravating factors: being upright-gravity Relieving factors: laying down   PRECAUTIONS: Other: post concussion   WEIGHT BEARING RESTRICTIONS No   FALLS:  Has patient fallen in last 6 months? No   LIVING ENVIRONMENT: Lives with:  kids   PLOF: Independent   PATIENT GOALS decrease pain, complete daily activities, get back to exercise   OBJECTIVE:    DIAGNOSTIC FINDINGS:  4/29 MRI IMPRESSION: 1. No  acute fracture or other traumatic injury in the cervical spine. 2. C5-C6 mild right neural foraminal narrowing.         COGNITION: Overall cognitive status: Difficulty to assess- pt reports cognitive difficulties since the accident     SENSATION: WFL   POSTURE: found in lobby in bent over posture with head in chair and ambulated with flexed posture   PALPATION: Significant spasm in cervical and lumbopelvic region       CERVICAL ROM:  grossly limited due to guarding at eval   Active ROM A/PROM (deg) eval  Flexion    Extension    Right lateral flexion    Left lateral flexion    Right rotation    Left rotation     (Blank rows = not tested)   UUPPER EXTREMITY MMT:   MMT Right eval Left eval  Shoulder flexion      Shoulder extension      Shoulder abduction      Shoulder adduction      Shoulder extension      Shoulder internal rotation      Shoulder external rotation      Middle trapezius      Lower trapezius      Elbow flexion      Elbow extension      Wrist flexion      Wrist extension      Wrist ulnar deviation      Wrist radial deviation      Wrist pronation      Wrist supination      Grip strength       (Blank rows = not tested)    TREATMENT: Pt seen for aquatic therapy today.  Treatment took place in water 3.25-4 ft in depth at the [facility] pool. Temp of water was 91.  Pt entered/exited the pool via stairs independently with bilat rail.   Forward walking   LB Stretching using jack knife positioning at steps forward flex and hip hiking Squoodle push down 2x12. Cues for abdominal bracing and glut isometric hold   Supported float (nekdoodle, waist belt, ankle floats):  Bad Ragaz for core elongation and QL strengthening Manual: cervical joint mobs -massage cervical spine, shoulders -stretching cervical spine upper traps all directions with pressure point release -snaking through water ranging cervical spine with gentle resistance of  water   Pt requires buoyancy for support and to offload joints with strengthening exercises. Viscosity of the water is needed for resistance of strengthening; water current perturbations provides challenge to standing balance unsupported, requiring increased core activation.    PATIENT EDUCATION:  Education details: Anatomy of condition, POC, HEP, exercise form/rationale. aquatics Person educated: Patient Education method: Explanation Education comprehension: verbalized understanding     HOME EXERCISE PROGRAM: [id number] - from past episode   ASSESSMENT:  Clinical Impression:   No sunglasses needed today although pt spends most of session squinting.  Difficulty stretching LB as pt appears to be hypermobile. Am able  to gain QL stretch with hip hiking. She responds very well to suspended supine positioning with cervical manipulation, massage and manual stretching reducing cervical discomfort by a few points. Reports she feels she has less headache and is able to think clearer. Of note: left upper trap with painful trigger point, decreases with massage and stretching. May benefit from DN. Goals ongoing    GOALS: Goals reviewed with patient? Yes   SHORT TERM GOALS: [place] date: 04/02/2022    Pt will verbalize proper rest through her day to reduce migraine symptoms Baseline:  Goal status: INITIAL     LONG TERM GOALS: [place] date: 05/14/2022   Pt will be able to drive around the local area without increase in symptoms Baseline:  Goal status: INITIAL   2.  Able to return to gentle exercise such as yoga Baseline:  Goal status: INITIAL   3.  Pt will be able to demo proper bending and lifting form necessary for ADLs Baseline:  Goal status: INITIAL   4.  Further goals to be set as care progressed- dependent on concussion symptoms Baseline:  Goal status: INITIAL     PLAN: PT FREQUENCY: 2x/week   PT DURATION: 8 weeks   PLANNED INTERVENTIONS: Therapeutic exercises, Therapeutic  activity, Neuromuscular re-education, Balance training, Gait training, Patient/Family education, Joint mobilization, Aquatic Therapy, Dry Needling, Electrical stimulation, Spinal mobilization, Cryotherapy, Moist heat, Taping, Manual therapy, and Re-evaluation   PLAN FOR NEXT SESSION: begin aquatics     Stanton Kidney Tharon Aquas) Bitania Shankland MPT 03/23/22 11:58 AM

## 2022-03-27 ENCOUNTER — Ambulatory Visit: Payer: Medicare Other | Admitting: Podiatry

## 2022-03-27 ENCOUNTER — Encounter (HOSPITAL_BASED_OUTPATIENT_CLINIC_OR_DEPARTMENT_OTHER): Payer: Self-pay | Admitting: Physical Therapy

## 2022-03-27 ENCOUNTER — Ambulatory Visit (HOSPITAL_BASED_OUTPATIENT_CLINIC_OR_DEPARTMENT_OTHER): Payer: Medicare Other | Attending: Neurosurgery | Admitting: Physical Therapy

## 2022-03-27 DIAGNOSIS — M546 Pain in thoracic spine: Secondary | ICD-10-CM | POA: Diagnosis present

## 2022-03-27 DIAGNOSIS — G8929 Other chronic pain: Secondary | ICD-10-CM | POA: Diagnosis present

## 2022-03-27 DIAGNOSIS — M542 Cervicalgia: Secondary | ICD-10-CM | POA: Diagnosis present

## 2022-03-27 DIAGNOSIS — M545 Low back pain, unspecified: Secondary | ICD-10-CM | POA: Insufficient documentation

## 2022-03-27 DIAGNOSIS — M62830 Muscle spasm of back: Secondary | ICD-10-CM | POA: Diagnosis present

## 2022-03-27 DIAGNOSIS — M62838 Other muscle spasm: Secondary | ICD-10-CM | POA: Diagnosis present

## 2022-03-27 NOTE — Therapy (Signed)
OUTPATIENT PHYSICAL THERAPY TREATMENT NOTE   Patient Name: [name] [name] MRN: [id number] DOB:1976-01-06, 46 y.o., female Today's Date: 03/27/2022     PT End of Session - 03/27/22 2009     Visit Number 5    Number of Visits 17    Date for PT Re-Evaluation 05/11/22    Authorization Type MCR    Progress Note Due on Visit 10    PT Start Time 0940    PT Stop Time 8786    PT Time Calculation (min) 35 min    Activity Tolerance Patient tolerated treatment well    Behavior During Therapy [facility] for tasks assessed/performed               Past Medical History:  Diagnosis Date   Adenomatous colon polyp 2008   Anxiety 1999/2000   Arthritis    "jaw joints; neck" (76/04/2093)   Complication of anesthesia    "didn't wake up well/remembered stuff from during OR when I had pituitary surgery"   Constipation    Depression    hx   DVT (deep vein thrombosis) in pregnancy 09/2013   "LLE; postpartum"   Fibromyalgia    GERD (gastroesophageal reflux disease)    H/O hiatal hernia    Hematochezia    IBS (irritable bowel syndrome)    Internal hemorrhoids    Migraines    "couple times/wk" (07/28/2014)   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 Silvano Rusk   ESOPHAGOGASTRODUODENOSCOPY  09/29/2004   Dr. Kennedy Bucker   LAPAROSCOPIC APPENDECTOMY N/A 07/28/2014   Procedure: APPENDECTOMY LAPAROSCOPIC;  Surgeon: Autumn Messing III, MD;  Location: [place];  Service: General;  Laterality: N/A;   TRANSPHENOIDAL / TRANSNASAL HYPOPHYSECTOMY / RESECTION PITUITARY TUMOR  1999   resection of benign pituitary tumor, [place]  2008   Patient Active Problem List   Diagnosis Date Noted   Neck  injury 02/18/2022   Chronic migraine w/o aura w/o status migrainosus, not intractable 03/21/2021   Protrusion of lumbar intervertebral disc 03/09/2021   Depression 03/16/2020   Ptosis of right eyelid 11/22/2017   Abnormal brain MRI 01/30/2017   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   Pain 06/21/2016   Tick bite of left lower leg 06/21/2016   Chronic fatigue 06/21/2016   History of pituitary surgery 09/26/2015   ADHD (attention deficit hyperactivity disorder) 05/04/2015   Anxiety state 03/08/2015   Upper respiratory tract infection 11/30/2014   Acute appendicitis 07/28/2014   Other malaise and fatigue 03/09/2014   Unspecified vitamin D deficiency 03/09/2014   Postpartum care following vaginal delivery (12/18) 10/08/2013   Normal labor and delivery 10/08/2013   Chronic migraine 01/20/2013   Headache, chronic migraine without aura, intractable 03/18/2012   Myalgia and myositis 03/18/2012   Lumbar spondylosis 03/18/2012   PERSONAL HISTORY OF ALLERGY TO EGGS- GI SENSITIVITY 05/04/2010   CHANGE IN BOWELS 05/02/2010   IRRITABLE BOWEL SYNDROME 12/04/2009   NONSPECIFIC ABN FINDNG RAD&OTH EXAM BILARY TRCT 11/30/2009   GERD 01/31/2009   ABDOMINAL PAIN-EPIGASTRIC 01/31/2009   PERSONAL HX COLONIC POLYPS 12/14/2008   INTERNAL HEMORRHOIDS 01/03/2007   HIATAL HERNIA 09/29/2004   PCP: None  REFERRING PROVIDER: Kary Kos, MD   REFERRING DIAG: M54.2 (ICD-10-CM) - Neck pain   THERAPY DIAG:  Chronic midline low back pain without sciatica  Muscle spasm of back  Pain in thoracic spine  Cervicalgia  Other muscle spasm  SUBJECTIVE:  The aquatics has been amazing. Pain feels like it is starting to condense to area of injury- starting to feel more in my jaw.       PERTINENT HISTORY:  See history listed   PAIN:  Are you having pain? Yes: 6/10 cervical spine Pain location: spine/head Pain description: my brain hurts Aggravating factors: being upright-gravity Relieving factors:  laying down   PRECAUTIONS: Other: post concussion   WEIGHT BEARING RESTRICTIONS No   FALLS:  Has patient fallen in last 6 months? No   LIVING ENVIRONMENT: Lives with:  kids   PLOF: Independent   PATIENT GOALS decrease pain, complete daily activities, get back to exercise   OBJECTIVE:    DIAGNOSTIC FINDINGS:  4/29 MRI IMPRESSION: 1. No acute fracture or other traumatic injury in the cervical spine. 2. C5-C6 mild right neural foraminal narrowing.         COGNITION: Overall cognitive status: Difficulty to assess- pt reports cognitive difficulties since the accident      POSTURE: found in lobby in bent over posture with head in chair and ambulated with flexed posture 6/6: pt able to demo upright posture without shoulder elevation   PALPATION: Significant spasm in cervical and lumbopelvic region       CERVICAL ROM:  grossly limited due to guarding at eval   Active ROM A/PROM (deg) eval  Flexion    Extension    Right lateral flexion    Left lateral flexion    Right rotation    Left rotation     (Blank rows = not tested)   UUPPER EXTREMITY MMT:   MMT Right eval Left eval  Shoulder flexion      Shoulder extension      Shoulder abduction      Shoulder adduction      Shoulder extension      Shoulder internal rotation      Shoulder external rotation      Grip strength       (Blank rows = not tested)    TREATMENT:  Mechanical traction, cervical: 15 min; max 20lb, min 5lb; 60s holds    PATIENT EDUCATION:  Education details: see plan Person educated: Patient Education method: Explanation Education comprehension: verbalized understanding     HOME EXERCISE PROGRAM: [id number] - from past episode   ASSESSMENT:  Clinical Impression:    Pt presents with significantly less anxiety than at eval but still had a difficult time tolerating lights without sunglasses so lights were dimmed and turned off. Utilized cervical traction for continued relief of  cervical pain. Discussed referral to Dr Michail Sermon as well as educated on option to bring in an orthodontist to care team- possibly considering ALF device. She has also heard of PRI approach through PT which we discussed, nearest practitioner is in [place] Abnormal gait, decreased activity tolerance, decreased knowledge of condition, difficulty walking, decreased ROM, increased muscle spasms, impaired flexibility, impaired UE functional use, impaired vision/preception, postural dysfunction, and pain.    ACTIVITY LIMITATIONS meal prep, cleaning, laundry, personal finances, driving, shopping, and community activity.    PERSONAL FACTORS 1-2 comorbidities: see history above  are also affecting patient's functional outcome.      REHAB POTENTIAL: Good  CLINICAL DECISION MAKING: Unstable/unpredictable   EVALUATION COMPLEXITY: High     GOALS: Goals reviewed with patient? Yes   SHORT TERM GOALS: Target date: 04/02/2022    Pt will verbalize proper rest through her day to reduce migraine symptoms Baseline:  Goal status: INITIAL     LONG TERM GOALS: Target date: 05/14/2022   Pt will be able to drive around the local area without increase in symptoms Baseline:  Goal status: INITIAL   2.  Able to return to gentle exercise such as yoga Baseline:  Goal status: INITIAL   3.  Pt will be able to demo proper bending and lifting form necessary for ADLs Baseline:  Goal status: INITIAL   4.  Further goals to be set as care progressed- dependent on concussion symptoms Baseline:  Goal status: INITIAL     PLAN: PT FREQUENCY: 2x/week   PT DURATION: 8 weeks   PLANNED INTERVENTIONS: Therapeutic exercises, Therapeutic activity, Neuromuscular re-education, Balance training, Gait training, Patient/Family education, Joint mobilization, Aquatic Therapy, Dry Needling, Electrical stimulation, Spinal mobilization, Cryotherapy, Moist heat, Taping, Manual therapy, and  Re-evaluation   PLAN FOR NEXT SESSION: continue gross mobility and proper muscle activation via aquatics.     Keanan Melander C. Aylen Stradford PT, DPT 03/27/22 8:18 PM

## 2022-03-28 ENCOUNTER — Ambulatory Visit (HOSPITAL_BASED_OUTPATIENT_CLINIC_OR_DEPARTMENT_OTHER): Payer: Medicare Other | Admitting: Physical Therapy

## 2022-03-28 ENCOUNTER — Encounter (HOSPITAL_BASED_OUTPATIENT_CLINIC_OR_DEPARTMENT_OTHER): Payer: Self-pay | Admitting: Physical Therapy

## 2022-03-28 DIAGNOSIS — M542 Cervicalgia: Secondary | ICD-10-CM

## 2022-03-28 DIAGNOSIS — M62830 Muscle spasm of back: Secondary | ICD-10-CM

## 2022-03-28 DIAGNOSIS — M545 Low back pain, unspecified: Secondary | ICD-10-CM | POA: Diagnosis not present

## 2022-03-28 DIAGNOSIS — G8929 Other chronic pain: Secondary | ICD-10-CM

## 2022-03-28 DIAGNOSIS — M546 Pain in thoracic spine: Secondary | ICD-10-CM

## 2022-03-28 NOTE — Therapy (Signed)
OUTPATIENT PHYSICAL THERAPY TREATMENT NOTE   Patient Name: [name] [name] MRN: [id number] DOB:1976/05/25, 46 y.o., female Today's Date: 03/28/2022     PT End of Session - 03/28/22 8416     Visit Number 6    Number of Visits 17    Date for PT Re-Evaluation 05/11/22    Authorization Type MCR    Progress Note Due on Visit 10    PT Start Time 0815   pt arrived late   PT Stop Time 0845    PT Time Calculation (min) 30 min    Activity Tolerance Patient tolerated treatment well    Behavior During Therapy [facility] for tasks assessed/performed              Past Medical History:  Diagnosis Date   Adenomatous colon polyp 2008   Anxiety 1999/2000   Arthritis    "jaw joints; neck" (60/03/3015)   Complication of anesthesia    "didn't wake up well/remembered stuff from during OR when I had pituitary surgery"   Constipation    Depression    hx   DVT (deep vein thrombosis) in pregnancy 09/2013   "LLE; postpartum"   Fibromyalgia    GERD (gastroesophageal reflux disease)    H/O hiatal hernia    Hematochezia    IBS (irritable bowel syndrome)    Internal hemorrhoids    Migraines    "couple times/wk" (07/28/2014)   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 Silvano Rusk   ESOPHAGOGASTRODUODENOSCOPY  09/29/2004   Dr. Kennedy Bucker   LAPAROSCOPIC APPENDECTOMY N/A 07/28/2014   Procedure: APPENDECTOMY LAPAROSCOPIC;  Surgeon: Autumn Messing III, MD;  Location: [place];  Service: General;  Laterality: N/A;   TRANSPHENOIDAL / TRANSNASAL HYPOPHYSECTOMY / RESECTION PITUITARY TUMOR  1999   resection of benign pituitary tumor, [place]  2008   Patient Active Problem List   Diagnosis Date  Noted   Neck injury 02/18/2022   Chronic migraine w/o aura w/o status migrainosus, not intractable 03/21/2021   Protrusion of lumbar intervertebral disc 03/09/2021   Depression 03/16/2020   Ptosis of right eyelid 11/22/2017   Abnormal brain MRI 01/30/2017   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   Pain 06/21/2016   Tick bite of left lower leg 06/21/2016   Chronic fatigue 06/21/2016   History of pituitary surgery 09/26/2015   ADHD (attention deficit hyperactivity disorder) 05/04/2015   Anxiety state 03/08/2015   Upper respiratory tract infection 11/30/2014   Acute appendicitis 07/28/2014   Other malaise and fatigue 03/09/2014   Unspecified vitamin D deficiency 03/09/2014   Postpartum care following vaginal delivery (12/18) 10/08/2013   Normal labor and delivery 10/08/2013   Chronic migraine 01/20/2013   Headache, chronic migraine without aura, intractable 03/18/2012   Myalgia and myositis 03/18/2012   Lumbar spondylosis 03/18/2012   PERSONAL HISTORY OF ALLERGY TO EGGS- GI SENSITIVITY 05/04/2010   CHANGE IN BOWELS 05/02/2010   IRRITABLE BOWEL SYNDROME 12/04/2009   NONSPECIFIC ABN FINDNG RAD&OTH EXAM BILARY TRCT 11/30/2009   GERD 01/31/2009   ABDOMINAL PAIN-EPIGASTRIC 01/31/2009   PERSONAL HX COLONIC POLYPS 12/14/2008   INTERNAL HEMORRHOIDS 01/03/2007   HIATAL HERNIA 09/29/2004   PCP:  None   REFERRING PROVIDER: Kary Kos, MD   REFERRING DIAG: M54.2 (ICD-10-CM) - Neck pain   THERAPY DIAG:  Cervicalgia  Chronic midline low back pain without sciatica  Muscle spasm of back  Pain in thoracic spine  SUBJECTIVE:  Pt was sore after traction; had light headache and trouble sleeping.   Are you in pain: Yes Pain scale: 7/10 Location: Generalized, but especially in the neck    PERTINENT HISTORY:  See history listed   PAIN:  Are you having pain? Yes: 6/10 cervical spine; LB 8/10 Pain location: spine/head Pain description: my brain hurts Aggravating factors: being  upright-gravity Relieving factors: laying down   PRECAUTIONS: Other: post concussion   WEIGHT BEARING RESTRICTIONS No   FALLS:  Has patient fallen in last 6 months? No   LIVING ENVIRONMENT: Lives with:  kids   PLOF: Independent   PATIENT GOALS decrease pain, complete daily activities, get back to exercise   OBJECTIVE:    DIAGNOSTIC FINDINGS:  4/29 MRI IMPRESSION: 1. No acute fracture or other traumatic injury in the cervical spine. 2. C5-C6 mild right neural foraminal narrowing.         COGNITION: Overall cognitive status: Difficulty to assess- pt reports cognitive difficulties since the accident     SENSATION: WFL   POSTURE: found in lobby in bent over posture with head in chair and ambulated with flexed posture   PALPATION: Significant spasm in cervical and lumbopelvic region       CERVICAL ROM:  grossly limited due to guarding at eval   Active ROM A/PROM (deg) eval  Flexion    Extension    Right lateral flexion    Left lateral flexion    Right rotation    Left rotation     (Blank rows = not tested)   UUPPER EXTREMITY MMT:   MMT Right eval Left eval  Shoulder flexion      Shoulder extension      Shoulder abduction      Shoulder adduction      Shoulder extension      Shoulder internal rotation      Shoulder external rotation      Middle trapezius      Lower trapezius      Elbow flexion      Elbow extension      Wrist flexion      Wrist extension      Wrist ulnar deviation      Wrist radial deviation      Wrist pronation      Wrist supination      Grip strength       (Blank rows = not tested)    TREATMENT: Pt seen for aquatic therapy today.  Treatment took place in water 3.25-4.5 ft in depth at the [facility]. Temp of water was 91.  Pt entered/exited the pool via stairs independently with bilat rail.   Pt donned sunglasses and hat (light sensitivity) -Forward walking without support - cues for upright posture -  lateral neck flexion L/R  - Low back stretch holding rails at stairs; repeated with lateral trunk flexion - yellow hand buoy push downs (to activate lower traps) -  Square noodle push down x 10. Cues for abdominal bracing   Supported float (nekdoodle, waist belt, ankle floats):  [place] Ragaz for core elongation and QL strengthening/ stretching Scap retraction with / without W's Snow angels (LE/UE)  Manual: suboccipital release -massage cervical spine, paraspinals -stretching cervical spine upper traps  all directions with pressure point release -snaking through water ranging cervical spine with gentle resistance of water   Pt requires buoyancy for support and to offload joints with strengthening exercises. Viscosity of the water is needed for resistance of strengthening; water current perturbations provides challenge to standing balance unsupported, requiring increased core activation.    PATIENT EDUCATION:  Education details: Anatomy of condition, POC, HEP, exercise form/rationale. aquatics Person educated: Patient Education method: Explanation Education comprehension: verbalized understanding     HOME EXERCISE PROGRAM: [id number] - from past episode   ASSESSMENT:  Clinical Impression:   Pt required sunglasses and hat when standing; sunglasses when supine.  She continues to respond very well to suspended supine positioning with massage and manual stretching reducing cervical discomfort by a few points. Though session was shortened due to late arrival, she reported reduction of tension / pain at end of session. Pt making gradual progress towards goals.   Kerin Perna, PTA 03/28/22 12:39 PM

## 2022-03-29 ENCOUNTER — Ambulatory Visit (HOSPITAL_BASED_OUTPATIENT_CLINIC_OR_DEPARTMENT_OTHER): Payer: Medicare Other | Admitting: Physical Therapy

## 2022-03-29 ENCOUNTER — Encounter (HOSPITAL_BASED_OUTPATIENT_CLINIC_OR_DEPARTMENT_OTHER): Payer: Self-pay | Admitting: Physical Therapy

## 2022-03-29 DIAGNOSIS — M542 Cervicalgia: Secondary | ICD-10-CM

## 2022-03-29 DIAGNOSIS — M545 Low back pain, unspecified: Secondary | ICD-10-CM | POA: Diagnosis not present

## 2022-03-29 NOTE — Therapy (Signed)
OUTPATIENT PHYSICAL THERAPY TREATMENT NOTE   Patient Name: [name] [name] MRN: [id number] DOB:July 29, 1976, 46 y.o., female Today's Date: 03/29/2022     PT End of Session - 03/29/22 1530     Visit Number 7    Number of Visits 17    Date for PT Re-Evaluation 05/11/22    Authorization Type MCR    Progress Note Due on Visit 10    PT Start Time 0900    PT Stop Time 0930    PT Time Calculation (min) 30 min    Activity Tolerance Patient tolerated treatment well    Behavior During Therapy [facility] for tasks assessed/performed              Past Medical History:  Diagnosis Date   Adenomatous colon polyp 2008   Anxiety 1999/2000   Arthritis    "jaw joints; neck" (76/04/2093)   Complication of anesthesia    "didn't wake up well/remembered stuff from during OR when I had pituitary surgery"   Constipation    Depression    hx   DVT (deep vein thrombosis) in pregnancy 09/2013   "LLE; postpartum"   Fibromyalgia    GERD (gastroesophageal reflux disease)    H/O hiatal hernia    Hematochezia    IBS (irritable bowel syndrome)    Internal hemorrhoids    Migraines    "couple times/wk" (07/28/2014)   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 Silvano Rusk   ESOPHAGOGASTRODUODENOSCOPY  09/29/2004   Dr. Kennedy Bucker   LAPAROSCOPIC APPENDECTOMY N/A 07/28/2014   Procedure: APPENDECTOMY LAPAROSCOPIC;  Surgeon: Autumn Messing III, MD;  Location: [place];  Service: General;  Laterality: N/A;   TRANSPHENOIDAL / TRANSNASAL HYPOPHYSECTOMY / RESECTION PITUITARY TUMOR  1999   resection of benign pituitary tumor, [place]  2008   Patient Active Problem List   Diagnosis Date Noted   Neck injury  02/18/2022   Chronic migraine w/o aura w/o status migrainosus, not intractable 03/21/2021   Protrusion of lumbar intervertebral disc 03/09/2021   Depression 03/16/2020   Ptosis of right eyelid 11/22/2017   Abnormal brain MRI 01/30/2017   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   Pain 06/21/2016   Tick bite of left lower leg 06/21/2016   Chronic fatigue 06/21/2016   History of pituitary surgery 09/26/2015   ADHD (attention deficit hyperactivity disorder) 05/04/2015   Anxiety state 03/08/2015   Upper respiratory tract infection 11/30/2014   Acute appendicitis 07/28/2014   Other malaise and fatigue 03/09/2014   Unspecified vitamin D deficiency 03/09/2014   Postpartum care following vaginal delivery (12/18) 10/08/2013   Normal labor and delivery 10/08/2013   Chronic migraine 01/20/2013   Headache, chronic migraine without aura, intractable 03/18/2012   Myalgia and myositis 03/18/2012   Lumbar spondylosis 03/18/2012   PERSONAL HISTORY OF ALLERGY TO EGGS- GI SENSITIVITY 05/04/2010   CHANGE IN BOWELS 05/02/2010   IRRITABLE BOWEL SYNDROME 12/04/2009   NONSPECIFIC ABN FINDNG RAD&OTH EXAM BILARY TRCT 11/30/2009   GERD 01/31/2009   ABDOMINAL PAIN-EPIGASTRIC 01/31/2009   PERSONAL HX COLONIC POLYPS 12/14/2008   INTERNAL HEMORRHOIDS 01/03/2007   HIATAL HERNIA 09/29/2004   PCP: None   REFERRING  PROVIDER: Kary Kos, MD   REFERRING DIAG: M54.2 (ICD-10-CM) - Neck pain   THERAPY DIAG:  Cervicalgia  Chronic midline low back pain without sciatica  SUBJECTIVE:  Severe SI pain, unable to move independently.   Are you in pain: Yes Pain scale: 9/10 Location: Generalized, but especially in the neck    PERTINENT HISTORY:  See history listed   PAIN:  Are you having pain? Yes: 6/10 cervical spine; LB 8/10 Pain location: spine/head Pain description: my brain hurts Aggravating factors: being upright-gravity Relieving factors: laying down   PRECAUTIONS: Other: post concussion   WEIGHT  BEARING RESTRICTIONS No   FALLS:  Has patient fallen in last 6 months? No   LIVING ENVIRONMENT: Lives with:  kids   PLOF: Independent   PATIENT GOALS decrease pain, complete daily activities, get back to exercise   OBJECTIVE:    DIAGNOSTIC FINDINGS:  4/29 MRI IMPRESSION: 1. No acute fracture or other traumatic injury in the cervical spine. 2. C5-C6 mild right neural foraminal narrowing.         COGNITION: Overall cognitive status: Difficulty to assess- pt reports cognitive difficulties since the accident     SENSATION: WFL   POSTURE: found in lobby in bent over posture with head in chair and ambulated with flexed posture   PALPATION: Significant spasm in cervical and lumbopelvic region       CERVICAL ROM:  grossly limited due to guarding at eval   Active ROM A/PROM (deg) eval  Flexion    Extension    Right lateral flexion    Left lateral flexion    Right rotation    Left rotation     (Blank rows = not tested)   UUPPER EXTREMITY MMT:   MMT Right eval Left eval  Shoulder flexion      Shoulder extension      Shoulder abduction      Shoulder adduction      Shoulder extension      Shoulder internal rotation      Shoulder external rotation      Middle trapezius      Lower trapezius      Elbow flexion      Elbow extension      Wrist flexion      Wrist extension      Wrist ulnar deviation      Wrist radial deviation      Wrist pronation      Wrist supination      Grip strength       (Blank rows = not tested)    TREATMENT: Pt seen for aquatic therapy today.  Treatment took place in water 3.25-4.5 ft in depth at the [facility]. Temp of water was 91.    Pt entered pool via  PT assist from side to enter, stairs to exit Utilized bilateral handrails   AquaticREHABdocumentation: Pt is unable to tolerate land due to pain or inability to move freely on land without pain and substitution/compensations , Water will allow for reduced gait  deviation due to reduced joint loading through buoyancy to help patient improve posture without excess stress and pain. , Water will allow for work on balance using up thrust to improve posture. The principles of viscosity will help slow movement allowing for better processing time during fall recovery practice, and Hydrostatic pressure also supports joints by unweighting joint load by at least 50 % in 3-4 feet depth water. 80% in chest to neck deep water.  PATIENT EDUCATION:  Education details: Anatomy of condition, POC, HEP, exercise form/rationale. aquatics Person educated: Patient Education method: Explanation Education comprehension: verbalized understanding     HOME EXERCISE PROGRAM: [id number] - from past episode   ASSESSMENT:  Clinical Impression:   Brought pt in today due to severe pain onset last night. She reports she was walking when she felt the SIJ "go out" and then fell to the floor and was unable to get up. I did speak with her last night and encouraged her to go to the ER for services if necessary. She arrived to PT today in a WC sitting on her Rt side and required Mod A to ambulate from [facility] to pool- unable to tolerate stairs and was lowered into the pool by PT from the side. Able to improve pelvic alignment and ability to obtain a standing position with multiple cues to utilize core and gluts for support. Pt was able to use stairs and bil hand rails to exit the pool and demo upright posture on land with discomfort but improved from arrival. Educated assist person that was with her on how to do isometrics at home for pelvic stability. Pt was assisted in transition to the hot tub by sitting on the edge and pivoting over the side.   Stephanee Barcomb C. Kolden Dupee PT, DPT 03/29/22 3:32 PM

## 2022-03-31 ENCOUNTER — Ambulatory Visit: Payer: Medicare Other | Admitting: Podiatry

## 2022-04-03 ENCOUNTER — Encounter (HOSPITAL_BASED_OUTPATIENT_CLINIC_OR_DEPARTMENT_OTHER): Payer: Self-pay | Admitting: Physical Therapy

## 2022-04-03 ENCOUNTER — Ambulatory Visit (HOSPITAL_BASED_OUTPATIENT_CLINIC_OR_DEPARTMENT_OTHER): Payer: Medicare Other | Admitting: Physical Therapy

## 2022-04-03 DIAGNOSIS — M546 Pain in thoracic spine: Secondary | ICD-10-CM

## 2022-04-03 DIAGNOSIS — M62830 Muscle spasm of back: Secondary | ICD-10-CM

## 2022-04-03 DIAGNOSIS — M542 Cervicalgia: Secondary | ICD-10-CM

## 2022-04-03 DIAGNOSIS — M545 Low back pain, unspecified: Secondary | ICD-10-CM

## 2022-04-03 NOTE — Therapy (Signed)
OUTPATIENT PHYSICAL THERAPY TREATMENT NOTE   Patient Name: [name] [name] MRN: [id number] DOB:1975/12/04, 46 y.o., female Today's Date: 04/03/2022     PT End of Session - 04/03/22 1618     Visit Number 8    Number of Visits 17    Date for PT Re-Evaluation 05/11/22    Authorization Type MCR    Progress Note Due on Visit 10    PT Start Time 1618    PT Stop Time 1700    PT Time Calculation (min) 42 min    Activity Tolerance Patient tolerated treatment well    Behavior During Therapy [facility] for tasks assessed/performed              Past Medical History:  Diagnosis Date   Adenomatous colon polyp 2008   Anxiety 1999/2000   Arthritis    "jaw joints; neck" (37/06/239)   Complication of anesthesia    "didn't wake up well/remembered stuff from during OR when I had pituitary surgery"   Constipation    Depression    hx   DVT (deep vein thrombosis) in pregnancy 09/2013   "LLE; postpartum"   Fibromyalgia    GERD (gastroesophageal reflux disease)    H/O hiatal hernia    Hematochezia    IBS (irritable bowel syndrome)    Internal hemorrhoids    Migraines    "couple times/wk" (07/28/2014)   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 Silvano Rusk   ESOPHAGOGASTRODUODENOSCOPY  09/29/2004   Dr. Kennedy Bucker   LAPAROSCOPIC APPENDECTOMY N/A 07/28/2014   Procedure: APPENDECTOMY LAPAROSCOPIC;  Surgeon: Autumn Messing III, MD;  Location: [place];  Service: General;  Laterality: N/A;   TRANSPHENOIDAL / TRANSNASAL HYPOPHYSECTOMY / RESECTION PITUITARY TUMOR  1999   resection of benign pituitary tumor, [place]  2008   Patient Active Problem List   Diagnosis Date Noted   Neck  injury 02/18/2022   Chronic migraine w/o aura w/o status migrainosus, not intractable 03/21/2021   Protrusion of lumbar intervertebral disc 03/09/2021   Depression 03/16/2020   Ptosis of right eyelid 11/22/2017   Abnormal brain MRI 01/30/2017   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   Pain 06/21/2016   Tick bite of left lower leg 06/21/2016   Chronic fatigue 06/21/2016   History of pituitary surgery 09/26/2015   ADHD (attention deficit hyperactivity disorder) 05/04/2015   Anxiety state 03/08/2015   Upper respiratory tract infection 11/30/2014   Acute appendicitis 07/28/2014   Other malaise and fatigue 03/09/2014   Unspecified vitamin D deficiency 03/09/2014   Postpartum care following vaginal delivery (12/18) 10/08/2013   Normal labor and delivery 10/08/2013   Chronic migraine 01/20/2013   Headache, chronic migraine without aura, intractable 03/18/2012   Myalgia and myositis 03/18/2012   Lumbar spondylosis 03/18/2012   PERSONAL HISTORY OF ALLERGY TO EGGS- GI SENSITIVITY 05/04/2010   CHANGE IN BOWELS 05/02/2010   IRRITABLE BOWEL SYNDROME 12/04/2009   NONSPECIFIC ABN FINDNG RAD&OTH EXAM BILARY TRCT 11/30/2009   GERD 01/31/2009   ABDOMINAL PAIN-EPIGASTRIC 01/31/2009   PERSONAL HX COLONIC POLYPS 12/14/2008   INTERNAL HEMORRHOIDS 01/03/2007   HIATAL HERNIA 09/29/2004   PCP: None  REFERRING PROVIDER: Kary Kos, MD   REFERRING DIAG: M54.2 (ICD-10-CM) - Neck pain   THERAPY DIAG:  Cervicalgia  Chronic midline low back pain without sciatica  Muscle spasm of back  Pain in thoracic spine  SUBJECTIVE:  "Hurting in my sacrum bad but it has improved since last visit.  Accidentally got hit in the neck with a kick board that popped out of the water and my neck is bad again"  Are you in pain: Yes Pain scale: 7/10 sacrum/LB; cervical spine 9/10 Location: Generalized, but especially in the neck    PERTINENT HISTORY:  See history listed   PAIN:  Are you having pain? Yes: 7/10  cervical spine; LB 8/10 Pain location: spine/head Pain description: my brain hurts Aggravating factors: being upright-gravity Relieving factors: laying down   PRECAUTIONS: Other: post concussion   WEIGHT BEARING RESTRICTIONS No   FALLS:  Has patient fallen in last 6 months? No   LIVING ENVIRONMENT: Lives with:  kids   PLOF: Independent   PATIENT GOALS decrease pain, complete daily activities, get back to exercise   OBJECTIVE:    DIAGNOSTIC FINDINGS:  4/29 MRI IMPRESSION: 1. No acute fracture or other traumatic injury in the cervical spine. 2. C5-C6 mild right neural foraminal narrowing.         COGNITION: Overall cognitive status: Difficulty to assess- pt reports cognitive difficulties since the accident     SENSATION: WFL   POSTURE: found in lobby in bent over posture with head in chair and ambulated with flexed posture   PALPATION: Significant spasm in cervical and lumbopelvic region       CERVICAL ROM:  grossly limited due to guarding at eval   Active ROM A/PROM (deg) eval  Flexion    Extension    Right lateral flexion    Left lateral flexion    Right rotation    Left rotation     (Blank rows = not tested)   UUPPER EXTREMITY MMT:   MMT Right eval Left eval  Shoulder flexion      Shoulder extension      Shoulder abduction      Shoulder adduction      Shoulder extension      Shoulder internal rotation      Shoulder external rotation      Middle trapezius      Lower trapezius      Elbow flexion      Elbow extension      Wrist flexion      Wrist extension      Wrist ulnar deviation      Wrist radial deviation      Wrist pronation      Wrist supination      Grip strength       (Blank rows = not tested)    TREATMENT:  Pt seen for aquatic therapy today.  Treatment took place in water 3.25-4 ft in depth at the [facility] pool. Temp of water was 91.  Pt entered/exited the pool via stairs  slowly but independently with bilat  rail.    Lumbosacral and pelvic ROM siting on noodle: hip hiking; ant/post pelvic tilts; rotation. Pt instructed to complete slowly moving into slight pain, stretch     Supported float (nekdoodle, waist belt, ankle floats):  [place] for core elongation and QL strengthening -hip extension assisted R/L 2x Manual: cervical joint mobs -massage cervical spine (sub occipitals/paraspinals), shoulders (trap) -stretching/ROM cervical spine upper traps all directions with pressure point  release -snaking through water ranging cervical spine with gentle resistance of water     Pt requires buoyancy for support and to offload joints with strengthening exercises. Viscosity of the water is needed for resistance of strengthening; water current perturbations provides challenge to standing balance unsupported, requiring increased core activation.       PATIENT EDUCATION:  Education details: Anatomy of condition, POC, HEP, exercise form/rationale. aquatics Person educated: Patient Education method: Explanation Education comprehension: verbalized understanding     HOME EXERCISE PROGRAM: [id number] - from past episode   ASSESSMENT:  Clinical Impression:   Some improvement in SI area from last visit.  She is able to walk indep and climb stairs slowly into pool. She reports some pinching with hip hiking on noodle left SI area which subsided with slow repetition. Another event occurred yesterday while in pool with dtr, kick board shot out of water and hit her in anterior cervical area increasing cervical pain.  SCM bilat tight and tender to palpate. Stretching/ROM and massage in suspended supine improves. Overall pain reduced after session cervical spine from 9/10 to 6/10 and SI 7/10 to 5/10.  She does require CGA climbing stairs to exit pool. Plan to complete ROM with strengthening as tolerated going forward.   OBJECTIVE IMPAIRMENTS Abnormal gait, decreased activity tolerance, decreased knowledge of  condition, difficulty walking, decreased ROM, increased muscle spasms, impaired flexibility, impaired UE functional use, impaired vision/preception, postural dysfunction, and pain.    ACTIVITY LIMITATIONS meal prep, cleaning, laundry, personal finances, driving, shopping, and community activity.    PERSONAL FACTORS 1-2 comorbidities: see history above  are also affecting patient's functional outcome.      REHAB POTENTIAL: Good   CLINICAL DECISION MAKING: Unstable/unpredictable   EVALUATION COMPLEXITY: High     GOALS: Goals reviewed with patient? Yes   SHORT TERM GOALS: Target date: 04/02/2022    Pt will verbalize proper rest through her day to reduce migraine symptoms Baseline:  Goal status: INITIAL     LONG TERM GOALS: Target date: 05/14/2022   Pt will be able to drive around the local area without increase in symptoms Baseline:  Goal status: INITIAL   2.  Able to return to gentle exercise such as yoga Baseline:  Goal status: INITIAL   3.  Pt will be able to demo proper bending and lifting form necessary for ADLs Baseline:  Goal status: INITIAL   4.  Further goals to be set as care progressed- dependent on concussion symptoms Baseline:  Goal status: INITIAL     PLAN: PT FREQUENCY: 2x/week   PT DURATION: 8 weeks   PLANNED INTERVENTIONS: Therapeutic exercises, Therapeutic activity, Neuromuscular re-education, Balance training, Gait training, Patient/Family education, Joint mobilization, Aquatic Therapy, Dry Needling, Electrical stimulation, Spinal mobilization, Cryotherapy, Moist heat, Taping, Manual therapy, and Re-evaluation   PLAN FOR NEXT SESSION: core and cervical strengthening.   Stanton Kidney Tharon Aquas) O[place]a Willy MPT 04/03/22 5:35 PM

## 2022-04-04 ENCOUNTER — Encounter (HOSPITAL_BASED_OUTPATIENT_CLINIC_OR_DEPARTMENT_OTHER): Payer: Self-pay | Admitting: Physical Therapy

## 2022-04-04 ENCOUNTER — Ambulatory Visit (HOSPITAL_BASED_OUTPATIENT_CLINIC_OR_DEPARTMENT_OTHER): Payer: Medicare Other | Admitting: Physical Therapy

## 2022-04-04 ENCOUNTER — Ambulatory Visit (HOSPITAL_BASED_OUTPATIENT_CLINIC_OR_DEPARTMENT_OTHER): Payer: Self-pay | Admitting: Physical Therapy

## 2022-04-04 DIAGNOSIS — M542 Cervicalgia: Secondary | ICD-10-CM

## 2022-04-04 DIAGNOSIS — M62830 Muscle spasm of back: Secondary | ICD-10-CM

## 2022-04-04 DIAGNOSIS — G8929 Other chronic pain: Secondary | ICD-10-CM

## 2022-04-04 DIAGNOSIS — M546 Pain in thoracic spine: Secondary | ICD-10-CM

## 2022-04-04 DIAGNOSIS — M62838 Other muscle spasm: Secondary | ICD-10-CM

## 2022-04-04 DIAGNOSIS — M545 Low back pain, unspecified: Secondary | ICD-10-CM | POA: Diagnosis not present

## 2022-04-04 NOTE — Therapy (Signed)
OUTPATIENT PHYSICAL THERAPY TREATMENT NOTE   Patient Name: [name] [name] MRN: [id number] DOB:10/01/1976, 46 y.o., female Today's Date: 04/04/2022     PT End of Session - 04/04/22 0942     Visit Number 9    Number of Visits 17    Date for PT Re-Evaluation 05/11/22    Authorization Type MCR    Progress Note Due on Visit 10    PT Start Time 0901    PT Stop Time 0945    PT Time Calculation (min) 44 min    Activity Tolerance Patient tolerated treatment well    Behavior During Therapy [facility] for tasks assessed/performed              Past Medical History:  Diagnosis Date   Adenomatous colon polyp 2008   Anxiety 1999/2000   Arthritis    "jaw joints; neck" (58/0/9983)   Complication of anesthesia    "didn't wake up well/remembered stuff from during OR when I had pituitary surgery"   Constipation    Depression    hx   DVT (deep vein thrombosis) in pregnancy 09/2013   "LLE; postpartum"   Fibromyalgia    GERD (gastroesophageal reflux disease)    H/O hiatal hernia    Hematochezia    IBS (irritable bowel syndrome)    Internal hemorrhoids    Migraines    "couple times/wk" (07/28/2014)   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 Silvano Rusk   ESOPHAGOGASTRODUODENOSCOPY  09/29/2004   Dr. Kennedy Bucker   LAPAROSCOPIC APPENDECTOMY N/A 07/28/2014   Procedure: APPENDECTOMY LAPAROSCOPIC;  Surgeon: Autumn Messing III, MD;  Location: [place];  Service: General;  Laterality: N/A;   TRANSPHENOIDAL / TRANSNASAL HYPOPHYSECTOMY / RESECTION PITUITARY TUMOR  1999   resection of benign pituitary tumor, [place]  2008   Patient Active Problem List   Diagnosis Date Noted   Neck  injury 02/18/2022   Chronic migraine w/o aura w/o status migrainosus, not intractable 03/21/2021   Protrusion of lumbar intervertebral disc 03/09/2021   Depression 03/16/2020   Ptosis of right eyelid 11/22/2017   Abnormal brain MRI 01/30/2017   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   Pain 06/21/2016   Tick bite of left lower leg 06/21/2016   Chronic fatigue 06/21/2016   History of pituitary surgery 09/26/2015   ADHD (attention deficit hyperactivity disorder) 05/04/2015   Anxiety state 03/08/2015   Upper respiratory tract infection 11/30/2014   Acute appendicitis 07/28/2014   Other malaise and fatigue 03/09/2014   Unspecified vitamin D deficiency 03/09/2014   Postpartum care following vaginal delivery (12/18) 10/08/2013   Normal labor and delivery 10/08/2013   Chronic migraine 01/20/2013   Headache, chronic migraine without aura, intractable 03/18/2012   Myalgia and myositis 03/18/2012   Lumbar spondylosis 03/18/2012   PERSONAL HISTORY OF ALLERGY TO EGGS- GI SENSITIVITY 05/04/2010   CHANGE IN BOWELS 05/02/2010   IRRITABLE BOWEL SYNDROME 12/04/2009   NONSPECIFIC ABN FINDNG RAD&OTH EXAM BILARY TRCT 11/30/2009   GERD 01/31/2009   ABDOMINAL PAIN-EPIGASTRIC 01/31/2009   PERSONAL HX COLONIC POLYPS 12/14/2008   INTERNAL HEMORRHOIDS 01/03/2007   HIATAL HERNIA 09/29/2004   PCP: None  REFERRING PROVIDER: Kary Kos, MD   REFERRING DIAG: M54.2 (ICD-10-CM) - Neck pain   THERAPY DIAG:  Cervicalgia  Chronic midline low back pain without sciatica  Muscle spasm of back  Pain in thoracic spine  Other muscle spasm  SUBJECTIVE:  "Felt better after last visit, pain still bad in right SI area"  Are you in pain: Yes Pain scale: 7/10 sacrum/LB; cervical spine 9/10 Location: Generalized, but especially in the neck    PERTINENT HISTORY:  See history listed   PAIN:  Are you having pain? Yes: 6/10 cervical spine; LB 8/10 Pain location: spine/head Pain description: my brain  hurts Aggravating factors: being upright-gravity Relieving factors: laying down   PRECAUTIONS: Other: post concussion   WEIGHT BEARING RESTRICTIONS No   FALLS:  Has patient fallen in last 6 months? No   LIVING ENVIRONMENT: Lives with:  kids   PLOF: Independent   PATIENT GOALS decrease pain, complete daily activities, get back to exercise   OBJECTIVE:    DIAGNOSTIC FINDINGS:  4/29 MRI IMPRESSION: 1. No acute fracture or other traumatic injury in the cervical spine. 2. C5-C6 mild right neural foraminal narrowing.         COGNITION: Overall cognitive status: Difficulty to assess- pt reports cognitive difficulties since the accident     SENSATION: WFL   POSTURE: found in lobby in bent over posture with head in chair and ambulated with flexed posture   PALPATION: Significant spasm in cervical and lumbopelvic region       CERVICAL ROM:  grossly limited due to guarding at eval   Active ROM A/PROM (deg) eval  Flexion    Extension    Right lateral flexion    Left lateral flexion    Right rotation    Left rotation     (Blank rows = not tested)   UUPPER EXTREMITY MMT:   MMT Right eval Left eval  Shoulder flexion      Shoulder extension      Shoulder abduction      Shoulder adduction      Shoulder extension      Shoulder internal rotation      Shoulder external rotation      Middle trapezius      Lower trapezius      Elbow flexion      Elbow extension      Wrist flexion      Wrist extension      Wrist ulnar deviation      Wrist radial deviation      Wrist pronation      Wrist supination      Grip strength       (Blank rows = not tested)    TREATMENT:  On land: Cervical ROM/stretching: cervical retraction, cervical flex/ext; side bending and rotation Manual: hip flex stretch in sup -trigger point release and massage of hip flex/iliopsoas.   Pt seen for aquatic therapy today.  Treatment took place in water 3.25-4 ft in depth at the [facility] pool. Temp of water was 91.  Pt entered/exited the pool via stairs  slowly but independently with bilat rail.    Lumbosacral and pelvic ROM siting on noodle: hip hiking; ant/post pelvic tilts; rotation. Pt instructed to complete slowly moving into slight pain, stretch Walking forward and back cues for normal step (rather than exaggerated) with arm swing Holding to wall: hip extension x10 R/L; add/abd x10 R/L -Figure 4 piriformis stretch  Pt requires buoyancy for support and to offload  joints with strengthening exercises. Viscosity of the water is needed for resistance of strengthening; water current perturbations provides challenge to standing balance unsupported, requiring increased core activation.       PATIENT EDUCATION:  Education details: Anatomy of condition, POC, HEP, exercise form/rationale. aquatics Person educated: Patient Education method: Explanation Education comprehension: verbalized understanding     HOME EXERCISE PROGRAM: [id number] - from past episode   ASSESSMENT:  Clinical Impression:   Right Hip flexos tight with palpation. Able to relieve with massage and trigger point release manually. She continues to have difficulty advancing rle with amb limited to pain, walking initially in crouched forward position. Improves walking in water. Pt instructed on hip flex stretching in sup as well as cervical retraction using rolled up small towel placed above crown of head to stretch suboccipitals. Pain in all areas slightly improved.   OBJECTIVE IMPAIRMENTS Abnormal gait, decreased activity tolerance, decreased knowledge of condition, difficulty walking, decreased ROM, increased muscle spasms, impaired flexibility, impaired UE functional use, impaired vision/preception, postural dysfunction, and pain.    ACTIVITY LIMITATIONS meal prep, cleaning, laundry, personal finances, driving, shopping, and community activity.    PERSONAL FACTORS 1-2 comorbidities: see  history above  are also affecting patient's functional outcome.      REHAB POTENTIAL: Good   CLINICAL DECISION MAKING: Unstable/unpredictable   EVALUATION COMPLEXITY: High     GOALS: Goals reviewed with patient? Yes   SHORT TERM GOALS: Target date: 04/02/2022    Pt will verbalize proper rest through her day to reduce migraine symptoms Baseline:  Goal status: INITIAL     LONG TERM GOALS: Target date: 05/14/2022   Pt will be able to drive around the local area without increase in symptoms Baseline:  Goal status: INITIAL   2.  Able to return to gentle exercise such as yoga Baseline:  Goal status: INITIAL   3.  Pt will be able to demo proper bending and lifting form necessary for ADLs Baseline:  Goal status: INITIAL   4.  Further goals to be set as care progressed- dependent on concussion symptoms Baseline:  Goal status: INITIAL     PLAN: PT FREQUENCY: 2x/week   PT DURATION: 8 weeks   PLANNED INTERVENTIONS: Therapeutic exercises, Therapeutic activity, Neuromuscular re-education, Balance training, Gait training, Patient/Family education, Joint mobilization, Aquatic Therapy, Dry Needling, Electrical stimulation, Spinal mobilization, Cryotherapy, Moist heat, Taping, Manual therapy, and Re-evaluation   PLAN FOR NEXT SESSION: core and cervical strengthening.   [name] Kidney Tharon Aquas) [place]verett [name] MPT 04/04/22 2:01 PM

## 2022-04-05 ENCOUNTER — Ambulatory Visit (HOSPITAL_BASED_OUTPATIENT_CLINIC_OR_DEPARTMENT_OTHER): Payer: Medicare Other | Admitting: Physical Therapy

## 2022-04-09 ENCOUNTER — Encounter (HOSPITAL_BASED_OUTPATIENT_CLINIC_OR_DEPARTMENT_OTHER): Payer: Self-pay | Admitting: Physical Therapy

## 2022-04-09 ENCOUNTER — Ambulatory Visit (HOSPITAL_BASED_OUTPATIENT_CLINIC_OR_DEPARTMENT_OTHER): Payer: Medicare Other | Admitting: Physical Therapy

## 2022-04-09 DIAGNOSIS — M542 Cervicalgia: Secondary | ICD-10-CM

## 2022-04-09 DIAGNOSIS — M545 Low back pain, unspecified: Secondary | ICD-10-CM | POA: Diagnosis not present

## 2022-04-09 DIAGNOSIS — G8929 Other chronic pain: Secondary | ICD-10-CM

## 2022-04-09 DIAGNOSIS — M62830 Muscle spasm of back: Secondary | ICD-10-CM

## 2022-04-09 DIAGNOSIS — M546 Pain in thoracic spine: Secondary | ICD-10-CM

## 2022-04-09 NOTE — Therapy (Signed)
OUTPATIENT PHYSICAL THERAPY TREATMENT NOTE Progress Note Reporting Period 03/12/22 to 04/09/22  See note below for Objective Data and Assessment of Progress/Goals.      Patient Name: [name] [name] MRN: [id number] DOB:30-Apr-1976, 46 y.o., female Today's Date: 04/09/2022     PT End of Session - 04/09/22 0828     Visit Number 10    Number of Visits 17    Date for PT Re-Evaluation 05/11/22    Authorization Type MCR    Progress Note Due on Visit 10    PT Start Time 0820    PT Stop Time 0900    PT Time Calculation (min) 40 min    Activity Tolerance Patient tolerated treatment well    Behavior During Therapy [facility] for tasks assessed/performed              Past Medical History:  Diagnosis Date   Adenomatous colon polyp 2008   Anxiety 1999/2000   Arthritis    "jaw joints; neck" (42/03/8340)   Complication of anesthesia    "didn't wake up well/remembered stuff from during OR when I had pituitary surgery"   Constipation    Depression    hx   DVT (deep vein thrombosis) in pregnancy 09/2013   "LLE; postpartum"   Fibromyalgia    GERD (gastroesophageal reflux disease)    H/O hiatal hernia    Hematochezia    IBS (irritable bowel syndrome)    Internal hemorrhoids    Migraines    "couple times/wk" (07/28/2014)   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 Silvano Rusk   ESOPHAGOGASTRODUODENOSCOPY  09/29/2004   Dr. Kennedy Bucker   LAPAROSCOPIC APPENDECTOMY N/A 07/28/2014   Procedure: APPENDECTOMY LAPAROSCOPIC;  Surgeon: Autumn Messing III, MD;  Location: [facility] OR;  Service: General;  Laterality: N/A;   TRANSPHENOIDAL / TRANSNASAL HYPOPHYSECTOMY / RESECTION PITUITARY TUMOR  1999   resection of benign  pituitary tumor, [place]  2008   Patient Active Problem List   Diagnosis Date Noted   Neck injury 02/18/2022   Chronic migraine w/o aura w/o status migrainosus, not intractable 03/21/2021   Protrusion of lumbar intervertebral disc 03/09/2021   Depression 03/16/2020   Ptosis of right eyelid 11/22/2017   Abnormal brain MRI 01/30/2017   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   Pain 06/21/2016   Tick bite of left lower leg 06/21/2016   Chronic fatigue 06/21/2016   History of pituitary surgery 09/26/2015   ADHD (attention deficit hyperactivity disorder) 05/04/2015   Anxiety state 03/08/2015   Upper respiratory tract infection 11/30/2014   Acute appendicitis 07/28/2014   Other malaise and fatigue 03/09/2014   Unspecified vitamin D deficiency 03/09/2014   Postpartum care following vaginal delivery (12/18) 10/08/2013   Normal labor and delivery 10/08/2013   Chronic migraine 01/20/2013   Headache, chronic migraine without aura, intractable 03/18/2012   Myalgia and myositis 03/18/2012   Lumbar spondylosis 03/18/2012   PERSONAL HISTORY OF ALLERGY TO EGGS- GI SENSITIVITY 05/04/2010   CHANGE IN BOWELS 05/02/2010   IRRITABLE BOWEL SYNDROME 12/04/2009   NONSPECIFIC ABN FINDNG RAD&OTH EXAM BILARY TRCT 11/30/2009   GERD 01/31/2009   ABDOMINAL PAIN-EPIGASTRIC 01/31/2009  PERSONAL HX COLONIC POLYPS 12/14/2008   INTERNAL HEMORRHOIDS 01/03/2007   HIATAL HERNIA 09/29/2004   PCP: None   REFERRING PROVIDER: Kary Kos, MD   REFERRING DIAG: M54.2 (ICD-10-CM) - Neck pain   THERAPY DIAG:  Cervicalgia  Chronic midline low back pain without sciatica  Muscle spasm of back  Pain in thoracic spine  SUBJECTIVE:  "Much better"  Are you in pain: Yes Pain scale: 6/10 sacrum/LB; cervical spine 7/10 Location: Generalized, but especially in the neck    PERTINENT HISTORY:  See history listed   PAIN:  Are you having pain? Yes: 6/10 cervical spine; LB  8/10 Pain location: spine/head Pain description: my brain hurts Aggravating factors: being upright-gravity Relieving factors: laying down   PRECAUTIONS: Other: post concussion   WEIGHT BEARING RESTRICTIONS No   FALLS:  Has patient fallen in last 6 months? No   LIVING ENVIRONMENT: Lives with:  kids   PLOF: Independent   PATIENT GOALS decrease pain, complete daily activities, get back to exercise   OBJECTIVE:    DIAGNOSTIC FINDINGS:  4/29 MRI IMPRESSION: 1. No acute fracture or other traumatic injury in the cervical spine. 2. C5-C6 mild right neural foraminal narrowing.         COGNITION: Overall cognitive status: Difficulty to assess- pt reports cognitive difficulties since the accident     SENSATION: WFL   POSTURE: found in lobby in bent over posture with head in chair and ambulated with flexed posture   PALPATION: Significant spasm in cervical and lumbopelvic region       CERVICAL ROM:  grossly limited due to guarding at eval   Active ROM A/PROM (deg) eval  Flexion    Extension    Right lateral flexion    Left lateral flexion    Right rotation    Left rotation     (Blank rows = not tested)   UUPPER EXTREMITY MMT:   MMT Right eval Left eval  Shoulder flexion      Shoulder extension      Shoulder abduction      Shoulder adduction      Shoulder extension      Shoulder internal rotation      Shoulder external rotation      Middle trapezius      Lower trapezius      Elbow flexion      Elbow extension      Wrist flexion      Wrist extension      Wrist ulnar deviation      Wrist radial deviation      Wrist pronation      Wrist supination      Grip strength       (Blank rows = not tested)    TREATMENT:  On land: Manual: hip flex stretch in sup and prone -trigger point release and massage of hip flex/iliopsoas.   Pt seen for aquatic therapy today.  Treatment took place in water 3.25-4 ft in depth at the [facility] pool. Temp  of water was 91.  Pt entered/exited the pool via stairs  slowly but independently with bilat rail.    Lumbosacral and pelvic ROM siting on noodle: hip hiking; ant/post pelvic tilts; rotation. Pt instructed to complete slowly moving into slight pain, stretch Walking forward and back cues for normal step  with arm swing Holding to wall: hip extension x10 R/L; add/abd x10 R/L -Figure 4 piriformis stretch  Suspended supine: Hip extension right x 10 resisted by ankle buoy. -  deep pressure and massage to suboccipitals, SCM, upper trap. -manual stretching and ROM cervical spine  Pt requires buoyancy for support and to offload joints with strengthening exercises. Viscosity of the water is needed for resistance of strengthening; water current perturbations provides challenge to standing balance unsupported, requiring increased core activation.       PATIENT EDUCATION:  Education details: Anatomy of condition, POC, HEP, exercise form/rationale. aquatics Person educated: Patient Education method: Explanation Education comprehension: verbalized understanding     HOME EXERCISE PROGRAM: [id number] - from past episode Hip extensor stretch in sup Prone on ball right hip pressure point Sub-occipital passive stretch    ASSESSMENT: Progress Note Clinical Impression:  Pain reduction c-spine: 5/10; sacrum 4/10 upon completion of session Migraines back to be being triggered by noise, light or hormones as before. She reports controlling migraines as before with meds and rest.  Low level headache since kickboard incident last week.  She reports tolerating car rides up to 30  minutes sitting on donut before needing to get out and stretch and is stretching neck and shoulders continuously while riding. She completes some yoga daily to stretch but has not been able to tolerate yoga classes as PLOF. She reports compliance with HEP stretching of hip flex as completed last visit, added using ball for trigger point  pressure in prone or modified prone position.  She gains most relief in pain and improved movement from manual techniques completed in and out of the pool. Hip flex tightness persists. She has made progress with improved amb toleration in upright position, limited by pain/weakness with initial movement only. Cervical spine ROM WNL, pain and stiffness at end range.     OBJECTIVE IMPAIRMENTS Abnormal gait, decreased activity tolerance, decreased knowledge of condition, difficulty walking, decreased ROM, increased muscle spasms, impaired flexibility, impaired UE functional use, impaired vision/preception, postural dysfunction, and pain.    ACTIVITY LIMITATIONS meal prep, cleaning, laundry, personal finances, driving, shopping, and community activity.    PERSONAL FACTORS 1-2 comorbidities: see history above  are also affecting patient's functional outcome.      REHAB POTENTIAL: Good   CLINICAL DECISION MAKING: Unstable/unpredictable   EVALUATION COMPLEXITY: High     GOALS: Goals reviewed with patient? Yes   SHORT TERM GOALS: Target date: 04/02/2022    Pt will verbalize proper rest through her day to reduce migraine symptoms Baseline:  Goal status: Achieved     LONG TERM GOALS: Target date: 05/14/2022   Pt will be able to drive around the local area without increase in symptoms Baseline:  Goal status: ongoing   2.  Able to return to gentle exercise such as yoga Baseline:  Goal status: ongoing   3.  Pt will be able to demo proper bending and lifting form necessary for ADLs Baseline:  Goal status: INITIAL   4.  Further goals to be set as care progressed- dependent on concussion symptoms Baseline:  Goal status: INITIAL     PLAN: PT FREQUENCY: 2x/week   PT DURATION: 8 weeks   PLANNED INTERVENTIONS: Therapeutic exercises, Therapeutic activity, Neuromuscular re-education, Balance training, Gait training, Patient/Family education, Joint mobilization, Aquatic Therapy, Dry  Needling, Electrical stimulation, Spinal mobilization, Cryotherapy, Moist heat, Taping, Manual therapy, and Re-evaluation   PLAN FOR NEXT SESSION: core and cervical strengthening.   Stanton Kidney Tharon Aquas) Odis Wickey MPT 04/09/22 8:37 AM

## 2022-04-10 ENCOUNTER — Ambulatory Visit (HOSPITAL_BASED_OUTPATIENT_CLINIC_OR_DEPARTMENT_OTHER): Payer: Medicare Other | Admitting: Physical Therapy

## 2022-04-12 ENCOUNTER — Ambulatory Visit (HOSPITAL_BASED_OUTPATIENT_CLINIC_OR_DEPARTMENT_OTHER): Payer: Medicare Other | Admitting: Physical Therapy

## 2022-04-12 DIAGNOSIS — G8929 Other chronic pain: Secondary | ICD-10-CM

## 2022-04-12 DIAGNOSIS — M542 Cervicalgia: Secondary | ICD-10-CM

## 2022-04-12 DIAGNOSIS — M62830 Muscle spasm of back: Secondary | ICD-10-CM

## 2022-04-12 DIAGNOSIS — M545 Low back pain, unspecified: Secondary | ICD-10-CM | POA: Diagnosis not present

## 2022-04-12 NOTE — Therapy (Signed)
OUTPATIENT PHYSICAL THERAPY TREATMENT    Patient Name: [name] [name] MRN: [id number] DOB:December 12, 1975, 46 y.o., female Today's Date: 04/12/2022     PT End of Session - 04/12/22 0931     Visit Number 11    Number of Visits 17    Date for PT Re-Evaluation 05/11/22    Authorization Type MCR    PT Start Time 9179    PT Stop Time 1505    PT Time Calculation (min) 43 min    Activity Tolerance Patient tolerated treatment well    Behavior During Therapy [facility] for tasks assessed/performed              Past Medical History:  Diagnosis Date   Adenomatous colon polyp 2008   Anxiety 1999/2000   Arthritis    "jaw joints; neck" (69/04/9479)   Complication of anesthesia    "didn't wake up well/remembered stuff from during OR when I had pituitary surgery"   Constipation    Depression    hx   DVT (deep vein thrombosis) in pregnancy 09/2013   "LLE; postpartum"   Fibromyalgia    GERD (gastroesophageal reflux disease)    H/O hiatal hernia    Hematochezia    IBS (irritable bowel syndrome)    Internal hemorrhoids    Migraines    "couple times/wk" (07/28/2014)   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 Silvano Rusk   ESOPHAGOGASTRODUODENOSCOPY  09/29/2004   Dr. Kennedy Bucker   LAPAROSCOPIC APPENDECTOMY N/A 07/28/2014   Procedure: APPENDECTOMY LAPAROSCOPIC;  Surgeon: Autumn Messing III, MD;  Location: [facility] OR;  Service: General;  Laterality: N/A;   TRANSPHENOIDAL / TRANSNASAL HYPOPHYSECTOMY / RESECTION PITUITARY TUMOR  1999   resection of benign pituitary tumor, [place]  2008   Patient Active Problem List   Diagnosis Date Noted   Neck injury 02/18/2022   Chronic migraine  w/o aura w/o status migrainosus, not intractable 03/21/2021   Protrusion of lumbar intervertebral disc 03/09/2021   Depression 03/16/2020   Ptosis of right eyelid 11/22/2017   Abnormal brain MRI 01/30/2017   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   Pain 06/21/2016   Tick bite of left lower leg 06/21/2016   Chronic fatigue 06/21/2016   History of pituitary surgery 09/26/2015   ADHD (attention deficit hyperactivity disorder) 05/04/2015   Anxiety state 03/08/2015   Upper respiratory tract infection 11/30/2014   Acute appendicitis 07/28/2014   Other malaise and fatigue 03/09/2014   Unspecified vitamin D deficiency 03/09/2014   Postpartum care following vaginal delivery (12/18) 10/08/2013   Normal labor and delivery 10/08/2013   Chronic migraine 01/20/2013   Headache, chronic migraine without aura, intractable 03/18/2012   Myalgia and myositis 03/18/2012   Lumbar spondylosis 03/18/2012   PERSONAL HISTORY OF ALLERGY TO EGGS- GI SENSITIVITY 05/04/2010   CHANGE IN BOWELS 05/02/2010   IRRITABLE BOWEL SYNDROME 12/04/2009   NONSPECIFIC ABN FINDNG RAD&OTH EXAM BILARY TRCT 11/30/2009   GERD 01/31/2009   ABDOMINAL PAIN-EPIGASTRIC 01/31/2009   PERSONAL HX COLONIC POLYPS 12/14/2008   INTERNAL HEMORRHOIDS 01/03/2007   HIATAL HERNIA 09/29/2004   PCP: None   REFERRING PROVIDER: Kary Kos, MD   REFERRING  DIAG: M54.2 (ICD-10-CM) - Neck pain   THERAPY DIAG:  Cervicalgia  Chronic midline low back pain without sciatica  Muscle spasm of back  SUBJECTIVE:  Pt reports that her SI joints feel "locked up" this morning.  She states that the psoas work last session helped.   Neck feel much better today.   Are you in pain: Yes Pain scale: 6-7/10 sacrum/LB Location: Generalized, but especially in the neck    PERTINENT HISTORY:  See history listed   PAIN:  Are you having pain? Yes: 6/10 cervical spine; LB 8/10 Pain location: spine/head Pain description: tight, locked Aggravating factors: being  upright-gravity Relieving factors: laying down   PRECAUTIONS: Other: post concussion   WEIGHT BEARING RESTRICTIONS No   FALLS:  Has patient fallen in last 6 months? No   LIVING ENVIRONMENT: Lives with:  kids   PLOF: Independent   PATIENT GOALS decrease pain, complete daily activities, get back to exercise   OBJECTIVE:    DIAGNOSTIC FINDINGS:  4/29 MRI IMPRESSION: 1. No acute fracture or other traumatic injury in the cervical spine. 2. C5-C6 mild right neural foraminal narrowing.         COGNITION: Overall cognitive status: Difficulty to assess- pt reports cognitive difficulties since the accident     SENSATION: WFL   POSTURE: found in lobby in bent over posture with head in chair and ambulated with flexed posture   PALPATION: Significant spasm in cervical and lumbopelvic region      04/12/22:  Rt ASIS lower than Lt (and tender) in standing    CERVICAL ROM:  grossly limited due to guarding at eval   Active ROM A/PROM (deg) eval  Flexion    Extension    Right lateral flexion    Left lateral flexion    Right rotation    Left rotation     (Blank rows = not tested)   UUPPER EXTREMITY MMT:   MMT Right eval Left eval  Shoulder flexion      Shoulder extension      Shoulder abduction      Shoulder adduction      Shoulder extension      Shoulder internal rotation      Shoulder external rotation      Middle trapezius      Lower trapezius      Elbow flexion      Elbow extension      Wrist flexion      Wrist extension      Wrist ulnar deviation      Wrist radial deviation      Wrist pronation      Wrist supination      Grip strength       (Blank rows = not tested)    TREATMENT: Pt donned sunglasses and hat (light sensitivity)  Pt seen for aquatic therapy today.  Treatment took place in water 3.25-4 ft in depth at the [facility] pool. Temp of water was 91-104.  Pt entered/exited the pool via stairs  slowly but independently with bilat  rail.    In water 104- Pelvic tilts - ant / post / side to side.  Seated pelvic circles (CW/CCW) R hip flexor stretch  In water 91-  Walking in deep end forward/ backward  MET correction: Rt foot on bench with hamstring contraction 5 sec followed by Rt knee to chest (Passive) - 5 rounds Seated on bench in water with feet on blue step:  adductor squeezes with orange ball x  5 sec x 5; STS with ball squeeze x 5 Forward/ backward walking in deep end, braiding, hip ext and more backward walking Suspended by yellow noodle under arms: bilat clam; SLS with hip IR/ER each LE   After pt is dry:  Reg rock tape applied with 25% stretch to bilat SI joints in X pattern to decompress and increase proprioception.   Pt requires buoyancy for support and to offload joints with strengthening exercises. Viscosity of the water is needed for resistance of strengthening; water current perturbations provides challenge to standing balance unsupported, requiring increased core activation.  PATIENT EDUCATION:  Education details: Anatomy of condition, POC, HEP, exercise form/rationale. aquatics Person educated: Patient Education method: Explanation Education comprehension: verbalized understanding     HOME EXERCISE PROGRAM: [id number] - from past episode Hip extensor stretch in sup Prone on ball right hip pressure point Sub-occipital passive stretch    ASSESSMENT:  Clinical Impression:  Pt presents with pelvic asymmetries - Rt innominate appears rotated anteriorly.  Responded well to guided self correction in water.  Pt reported reduction of pain in Rt SI after completion of exercises and MET in pool. However after using hot tub (after completion of session - unbilled), pt reported increased Lt SI pain and difficulty walking to get dressed. Pt continues to be light and sound sensitive; wears hat/ glasses while in pool area.  Pt making gradual progress towards goals.     OBJECTIVE IMPAIRMENTS Abnormal gait,  decreased activity tolerance, decreased knowledge of condition, difficulty walking, decreased ROM, increased muscle spasms, impaired flexibility, impaired UE functional use, impaired vision/preception, postural dysfunction, and pain.    ACTIVITY LIMITATIONS meal prep, cleaning, laundry, personal finances, driving, shopping, and community activity.    PERSONAL FACTORS 1-2 comorbidities: see history above  are also affecting patient's functional outcome.      REHAB POTENTIAL: Good   CLINICAL DECISION MAKING: Unstable/unpredictable   EVALUATION COMPLEXITY: High     GOALS: Goals reviewed with patient? Yes   SHORT TERM GOALS: Target date: 04/02/2022    Pt will verbalize proper rest through her day to reduce migraine symptoms Baseline:  Goal status: Achieved     LONG TERM GOALS: Target date: 05/14/2022   Pt will be able to drive around the local area without increase in symptoms Baseline:  Goal status: ongoing   2.  Able to return to gentle exercise such as yoga Baseline:  Goal status: ongoing   3.  Pt will be able to demo proper bending and lifting form necessary for ADLs Baseline:  Goal status: INITIAL   4.  Further goals to be set as care progressed- dependent on concussion symptoms Baseline:  Goal status: INITIAL     PLAN: PT FREQUENCY: 2x/week   PT DURATION: 8 weeks   PLANNED INTERVENTIONS: Therapeutic exercises, Therapeutic activity, Neuromuscular re-education, Balance training, Gait training, Patient/Family education, Joint mobilization, Aquatic Therapy, Dry Needling, Electrical stimulation, Spinal mobilization, Cryotherapy, Moist heat, Taping, Manual therapy, and Re-evaluation   PLAN FOR NEXT SESSION: core and cervical strengthening. Assess pelvic alignment.   Kerin Perna, PTA 04/12/22 3:11 PM

## 2022-04-17 ENCOUNTER — Encounter (HOSPITAL_BASED_OUTPATIENT_CLINIC_OR_DEPARTMENT_OTHER): Payer: Self-pay | Admitting: Physical Therapy

## 2022-04-17 ENCOUNTER — Ambulatory Visit (HOSPITAL_BASED_OUTPATIENT_CLINIC_OR_DEPARTMENT_OTHER): Payer: Medicare Other | Admitting: Physical Therapy

## 2022-04-17 DIAGNOSIS — M545 Low back pain, unspecified: Secondary | ICD-10-CM | POA: Diagnosis not present

## 2022-04-17 DIAGNOSIS — M542 Cervicalgia: Secondary | ICD-10-CM

## 2022-04-17 NOTE — Therapy (Signed)
OUTPATIENT PHYSICAL THERAPY TREATMENT    Patient Name: [name] [name] MRN: [id number] DOB:11/22/1975, 46 y.o., female Today's Date: 04/17/2022     PT End of Session - 04/17/22 1537     Visit Number 12    Number of Visits 17    Date for PT Re-Evaluation 05/11/22    Authorization Type MCR    PT Start Time 1525    PT Stop Time 1600    PT Time Calculation (min) 35 min    Activity Tolerance Patient tolerated treatment well    Behavior During Therapy [facility] for tasks assessed/performed              Past Medical History:  Diagnosis Date   Adenomatous colon polyp 2008   Anxiety 1999/2000   Arthritis    "jaw joints; neck" (07/28/2014)   Complication of anesthesia    "didn't wake up well/remembered stuff from during OR when I had pituitary surgery"   Constipation    Depression    hx   DVT (deep vein thrombosis) in pregnancy 09/2013   "LLE; postpartum"   Fibromyalgia    GERD (gastroesophageal reflux disease)    H/O hiatal hernia    Hematochezia    IBS (irritable bowel syndrome)    Internal hemorrhoids    Migraines    "couple times/wk" (07/28/2014)   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 Stan Head   ESOPHAGOGASTRODUODENOSCOPY  09/29/2004   Dr. Karolee Ohs   LAPAROSCOPIC APPENDECTOMY N/A 07/28/2014   Procedure: APPENDECTOMY LAPAROSCOPIC;  Surgeon: Chevis Pretty III, MD;  Location: [facility] OR;  Service: General;  Laterality: N/A;   TRANSPHENOIDAL / TRANSNASAL HYPOPHYSECTOMY / RESECTION PITUITARY TUMOR  1999   resection of benign pituitary tumor, [facility]   UTERINE FIBROID SURGERY  2008   Patient Active Problem List   Diagnosis Date Noted   Neck injury 02/18/2022   Chronic migraine  w/o aura w/o status migrainosus, not intractable 03/21/2021   Protrusion of lumbar intervertebral disc 03/09/2021   Depression 03/16/2020   Ptosis of right eyelid 11/22/2017   Abnormal brain MRI 01/30/2017   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   Pain 06/21/2016   Tick bite of left lower leg 06/21/2016   Chronic fatigue 06/21/2016   History of pituitary surgery 09/26/2015   ADHD (attention deficit hyperactivity disorder) 05/04/2015   Anxiety state 03/08/2015   Upper respiratory tract infection 11/30/2014   Acute appendicitis 07/28/2014   Other malaise and fatigue 03/09/2014   Unspecified vitamin D deficiency 03/09/2014   Postpartum care following vaginal delivery (12/18) 10/08/2013   Normal labor and delivery 10/08/2013   Chronic migraine 01/20/2013   Headache, chronic migraine without aura, intractable 03/18/2012   Myalgia and myositis 03/18/2012   Lumbar spondylosis 03/18/2012   PERSONAL HISTORY OF ALLERGY TO EGGS- GI SENSITIVITY 05/04/2010   CHANGE IN BOWELS 05/02/2010   IRRITABLE BOWEL SYNDROME 12/04/2009   NONSPECIFIC ABN FINDNG RAD&OTH EXAM BILARY TRCT 11/30/2009   GERD 01/31/2009   ABDOMINAL PAIN-EPIGASTRIC 01/31/2009   PERSONAL HX COLONIC POLYPS 12/14/2008   INTERNAL HEMORRHOIDS 01/03/2007   HIATAL HERNIA 09/29/2004   PCP: None   REFERRING PROVIDER: Donalee Citrin, MD   REFERRING  DIAG: M54.2 (ICD-10-CM) - Neck pain   THERAPY DIAG:  Cervicalgia  SUBJECTIVE:  Felt pretty good after last session. Not mixing up my words as much.   Are you in pain: Yes Pain scale: 6-7/10 sacrum/LB Location: Generalized, but especially in the neck    PERTINENT HISTORY:  See history listed   PAIN:  Are you having pain? Yes: 6/10 cervical spine; LB 8/10 Pain location: spine/head Pain description: tight, locked Aggravating factors: being upright-gravity Relieving factors: laying down   PRECAUTIONS: Other: post concussion   WEIGHT BEARING RESTRICTIONS No   FALLS:  Has patient  fallen in last 6 months? No   LIVING ENVIRONMENT: Lives with:  kids   PLOF: Independent   PATIENT GOALS decrease pain, complete daily activities, get back to exercise   OBJECTIVE:    DIAGNOSTIC FINDINGS:  4/29 MRI IMPRESSION: 1. No acute fracture or other traumatic injury in the cervical spine. 2. C5-C6 mild right neural foraminal narrowing.         COGNITION: Overall cognitive status: Difficulty to assess- pt reports cognitive difficulties since the accident     SENSATION: WFL   POSTURE: found in lobby in bent over posture with head in chair and ambulated with flexed posture   PALPATION: Significant spasm in cervical and lumbopelvic region      04/12/22:  Rt ASIS lower than Lt (and tender) in standing    CERVICAL ROM:  grossly limited due to guarding at eval   Active ROM A/PROM (deg) eval  Flexion    Extension    Right lateral flexion    Left lateral flexion    Right rotation    Left rotation     (Blank rows = not tested)   UUPPER EXTREMITY MMT:   MMT Right eval Left eval  Shoulder flexion      Shoulder extension      Shoulder abduction      Shoulder adduction      Shoulder extension      Shoulder internal rotation      Shoulder external rotation      Middle trapezius      Lower trapezius      Elbow flexion      Elbow extension      Wrist flexion      Wrist extension      Wrist ulnar deviation      Wrist radial deviation      Wrist pronation      Wrist supination      Grip strength       (Blank rows = not tested)    TREATMENT: Pt donned sunglasses and hat (light sensitivity)  Pt seen for aquatic therapy today.  Treatment took place in water 3.25-4 ft in depth at the [facility] pool. Temp of water was 91-104.  Pt entered/exited the pool via stairs  slowly but independently with bilat rail.    Low load, long duration illiopsoas stretch with noodle under foot ER stretch float on noodle Suspended plank- noodle under hips, hands  on side of pool- push ups with core contraction In wall sit: scap retraction + retraction +extension press into water- manual STM applied to Rt levator scap during motion.    PATIENT EDUCATION:  Education details: Anatomy of condition, POC, HEP, exercise form/rationale. aquatics Person educated: Patient Education method: Explanation Education comprehension: verbalized understanding     HOME EXERCISE PROGRAM: [id number] - from past episode Hip extensor stretch in sup Prone on ball right hip pressure point  Sub-occipital passive stretch    ASSESSMENT:  Pt arrived prepared for aquatics so that is where we worked today. Overall she is standing more upright, less guarded in movement and does not show facial signs of pain with activity. She states that, prior to the accident, she planned to go on a mission trip with her daughters toward the end of July. Due to continued concussion symptoms, exercises are being progressed slowly to monitor.     OBJECTIVE IMPAIRMENTS Abnormal gait, decreased activity tolerance, decreased knowledge of condition, difficulty walking, decreased ROM, increased muscle spasms, impaired flexibility, impaired UE functional use, impaired vision/preception, postural dysfunction, and pain.    ACTIVITY LIMITATIONS meal prep, cleaning, laundry, personal finances, driving, shopping, and community activity.    PERSONAL FACTORS 1-2 comorbidities: see history above  are also affecting patient's functional outcome.      REHAB POTENTIAL: Good   CLINICAL DECISION MAKING: Unstable/unpredictable   EVALUATION COMPLEXITY: High     GOALS: Goals reviewed with patient? Yes   SHORT TERM GOALS: Target date: 04/02/2022    Pt will verbalize proper rest through her day to reduce migraine symptoms Baseline:  Goal status: Achieved     LONG TERM GOALS: Target date: 05/14/2022   Pt will be able to drive around the local area without increase in symptoms Baseline: natural light is  better, I can drive here and be ok (<09 min) Goal status: ongoing   2.  Able to return to gentle exercise such as yoga Baseline:  Goal status: ongoing   3.  Pt will be able to demo proper bending and lifting form necessary for ADLs Baseline:  Goal status: INITIAL   4.  Further goals to be set as care progressed- dependent on concussion symptoms Baseline:  Goal status: INITIAL     PLAN: PT FREQUENCY: 2x/week   PT DURATION: 8 weeks   PLANNED INTERVENTIONS: Therapeutic exercises, Therapeutic activity, Neuromuscular re-education, Balance training, Gait training, Patient/Family education, Joint mobilization, Aquatic Therapy, Dry Needling, Electrical stimulation, Spinal mobilization, Cryotherapy, Moist heat, Taping, Manual therapy, and Re-evaluation   PLAN FOR NEXT SESSION: continue core stability and persicap strength  Saidi Santacroce C. Verna Hamon PT, DPT 04/17/22 8:38 PM

## 2022-04-18 ENCOUNTER — Ambulatory Visit: Payer: Medicare Other | Admitting: Podiatry

## 2022-04-18 ENCOUNTER — Ambulatory Visit (INDEPENDENT_AMBULATORY_CARE_PROVIDER_SITE_OTHER): Payer: Medicare Other | Admitting: Podiatry

## 2022-04-18 DIAGNOSIS — S90111A Contusion of right great toe without damage to nail, initial encounter: Secondary | ICD-10-CM | POA: Diagnosis not present

## 2022-04-18 DIAGNOSIS — M21622 Bunionette of left foot: Secondary | ICD-10-CM | POA: Diagnosis not present

## 2022-04-18 DIAGNOSIS — M21621 Bunionette of right foot: Secondary | ICD-10-CM | POA: Diagnosis not present

## 2022-04-18 DIAGNOSIS — Z9109 Other allergy status, other than to drugs and biological substances: Secondary | ICD-10-CM | POA: Diagnosis not present

## 2022-04-18 DIAGNOSIS — L600 Ingrowing nail: Secondary | ICD-10-CM

## 2022-04-18 DIAGNOSIS — S90121A Contusion of right lesser toe(s) without damage to nail, initial encounter: Secondary | ICD-10-CM

## 2022-04-18 NOTE — Patient Instructions (Signed)

## 2022-04-19 ENCOUNTER — Encounter (HOSPITAL_BASED_OUTPATIENT_CLINIC_OR_DEPARTMENT_OTHER): Payer: Self-pay | Admitting: Physical Therapy

## 2022-04-19 ENCOUNTER — Ambulatory Visit (HOSPITAL_BASED_OUTPATIENT_CLINIC_OR_DEPARTMENT_OTHER): Payer: Medicare Other | Admitting: Physical Therapy

## 2022-04-19 DIAGNOSIS — M545 Low back pain, unspecified: Secondary | ICD-10-CM | POA: Diagnosis not present

## 2022-04-19 DIAGNOSIS — M542 Cervicalgia: Secondary | ICD-10-CM

## 2022-04-19 DIAGNOSIS — M62838 Other muscle spasm: Secondary | ICD-10-CM

## 2022-04-19 DIAGNOSIS — M62830 Muscle spasm of back: Secondary | ICD-10-CM

## 2022-04-19 DIAGNOSIS — M546 Pain in thoracic spine: Secondary | ICD-10-CM

## 2022-04-19 NOTE — Therapy (Signed)
OUTPATIENT PHYSICAL THERAPY TREATMENT    Patient Name: [name] [name] MRN: [id number] DOB:16-Sep-1976, 46 y.o., female Today's Date: 04/19/2022     PT End of Session - 04/19/22 0945     Visit Number 13    Number of Visits 17    Date for PT Re-Evaluation 05/11/22    Authorization Type MCR    PT Start Time 0932    PT Stop Time 1010    PT Time Calculation (min) 38 min    Activity Tolerance Patient tolerated treatment well    Behavior During Therapy [facility] for tasks assessed/performed               Past Medical History:  Diagnosis Date   Adenomatous colon polyp 2008   Anxiety 1999/2000   Arthritis    "jaw joints; neck" (31/02/1760)   Complication of anesthesia    "didn't wake up well/remembered stuff from during OR when I had pituitary surgery"   Constipation    Depression    hx   DVT (deep vein thrombosis) in pregnancy 09/2013   "LLE; postpartum"   Fibromyalgia    GERD (gastroesophageal reflux disease)    H/O hiatal hernia    Hematochezia    IBS (irritable bowel syndrome)    Internal hemorrhoids    Migraines    "couple times/wk" (07/28/2014)   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 Silvano Rusk   ESOPHAGOGASTRODUODENOSCOPY  09/29/2004   Dr. Kennedy Bucker   LAPAROSCOPIC APPENDECTOMY N/A 07/28/2014   Procedure: APPENDECTOMY LAPAROSCOPIC;  Surgeon: Autumn Messing III, MD;  Location: [place];  Service: General;  Laterality: N/A;   TRANSPHENOIDAL / TRANSNASAL HYPOPHYSECTOMY / RESECTION PITUITARY TUMOR  1999   resection of benign pituitary tumor, [place]  2008   Patient Active Problem List   Diagnosis Date Noted   Neck injury 02/18/2022   Chronic  migraine w/o aura w/o status migrainosus, not intractable 03/21/2021   Protrusion of lumbar intervertebral disc 03/09/2021   Depression 03/16/2020   Ptosis of right eyelid 11/22/2017   Abnormal brain MRI 01/30/2017   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   Pain 06/21/2016   Tick bite of left lower leg 06/21/2016   Chronic fatigue 06/21/2016   History of pituitary surgery 09/26/2015   ADHD (attention deficit hyperactivity disorder) 05/04/2015   Anxiety state 03/08/2015   Upper respiratory tract infection 11/30/2014   Acute appendicitis 07/28/2014   Other malaise and fatigue 03/09/2014   Unspecified vitamin D deficiency 03/09/2014   Postpartum care following vaginal delivery (12/18) 10/08/2013   Normal labor and delivery 10/08/2013   Chronic migraine 01/20/2013   Headache, chronic migraine without aura, intractable 03/18/2012   Myalgia and myositis 03/18/2012   Lumbar spondylosis 03/18/2012   PERSONAL HISTORY OF ALLERGY TO EGGS- GI SENSITIVITY 05/04/2010   CHANGE IN BOWELS 05/02/2010   IRRITABLE BOWEL SYNDROME 12/04/2009   NONSPECIFIC ABN FINDNG RAD&OTH EXAM BILARY TRCT 11/30/2009   GERD 01/31/2009   ABDOMINAL PAIN-EPIGASTRIC 01/31/2009   PERSONAL HX COLONIC POLYPS 12/14/2008   INTERNAL HEMORRHOIDS 01/03/2007   HIATAL HERNIA 09/29/2004   PCP: None   REFERRING PROVIDER: Kary Kos, MD  REFERRING DIAG: M54.2 (ICD-10-CM) - Neck pain   THERAPY DIAG:  Cervicalgia  Chronic midline low back pain without sciatica  Muscle spasm of back  Pain in thoracic spine  Other muscle spasm  SUBJECTIVE:  Pt reports pelvis feels more level, walking better, headaches are improving.   Are you in pain: Yes Pain scale: 6-7/10 sacrum/LB Location: Generalized, but especially in the sacrum    PERTINENT HISTORY:  See history listed   PAIN:  Are you having pain? Yes: 6/10  Pain location: sacrum Pain description: tight, locked Aggravating factors: being upright-gravity Relieving factors:  laying down   PRECAUTIONS: Other: post concussion   WEIGHT BEARING RESTRICTIONS No   FALLS:  Has patient fallen in last 6 months? No   LIVING ENVIRONMENT: Lives with:  kids   PLOF: Independent   PATIENT GOALS decrease pain, complete daily activities, get back to exercise   OBJECTIVE:    DIAGNOSTIC FINDINGS:  4/29 MRI IMPRESSION: 1. No acute fracture or other traumatic injury in the cervical spine. 2. C5-C6 mild right neural foraminal narrowing.         COGNITION: Overall cognitive status: Difficulty to assess- pt reports cognitive difficulties since the accident     SENSATION: WFL   POSTURE: found in lobby in bent over posture with head in chair and ambulated with flexed posture   PALPATION: Significant spasm in cervical and lumbopelvic region      04/12/22:  Rt ASIS lower than Lt (and tender) in standing    CERVICAL ROM:  grossly limited due to guarding at eval   Active ROM A/PROM (deg) eval  Flexion    Extension    Right lateral flexion    Left lateral flexion    Right rotation    Left rotation     (Blank rows = not tested)   UUPPER EXTREMITY MMT:   MMT Right eval Left eval  Shoulder flexion      Shoulder extension      Shoulder abduction      Shoulder adduction      Shoulder extension      Shoulder internal rotation      Shoulder external rotation      Middle trapezius      Lower trapezius      Elbow flexion      Elbow extension      Wrist flexion      Wrist extension      Wrist ulnar deviation      Wrist radial deviation      Wrist pronation      Wrist supination      Grip strength       (Blank rows = not tested)    TREATMENT: Pt donned sunglasses (light sensitivity)  Pt seen for aquatic therapy today.  Treatment took place in water 3.25-4.8 ft in depth at the [facility] pool. Temp of water was 91-104.  Pt entered/exited the pool via stairs  slowly but independently with bilat rail.    Forward/ backward walking in  deepest water; high knee marching forward/ backward Side stepping with arm abdct / add  Warrior 1 lifting noodle off of water / up towards ceiling x 5 each LE forward Warrior 2 Warrior 3 holding yellow noodle forward x 5 each LE  Plank on yellow noodle with pull downs x 7 Modified triangle pose x 3 each side Hamstring, ITB, adductor stretch with LE supported by thin sqoodle, back against wall Quad/ hip flexor stretch with ankle on  noodle  Once pt was dried off - applied reg Rock tape in X pattern across sacrum    PATIENT EDUCATION:  Education details: Anatomy of condition, POC, HEP, exercise form/rationale. aquatics Person educated: Patient Education method: Explanation Education comprehension: verbalized understanding     HOME EXERCISE PROGRAM: [id number] - from past episode Hip extensor stretch in sup Prone on ball right hip pressure point Sub-occipital passive stretch    ASSESSMENT:  Pt tolerated modified yoga poses in water well today.  Cues to bring spine into more neutral position ( vs flexed lumbar and shortened in cervical). Pt reported significant reduction of pain once in the water.  Continues to get relief with kinesiology tape; reapplied today.  Due to continued concussion symptoms, exercises are being progressed slowly to monitor.     OBJECTIVE IMPAIRMENTS Abnormal gait, decreased activity tolerance, decreased knowledge of condition, difficulty walking, decreased ROM, increased muscle spasms, impaired flexibility, impaired UE functional use, impaired vision/preception, postural dysfunction, and pain.    ACTIVITY LIMITATIONS meal prep, cleaning, laundry, personal finances, driving, shopping, and community activity.    PERSONAL FACTORS 1-2 comorbidities: see history above  are also affecting patient's functional outcome.      REHAB POTENTIAL: Good   CLINICAL DECISION MAKING: Unstable/unpredictable   EVALUATION COMPLEXITY: High     GOALS: Goals reviewed with  patient? Yes   SHORT TERM GOALS: Target date: 04/02/2022    Pt will verbalize proper rest through her day to reduce migraine symptoms Baseline:  Goal status: Achieved     LONG TERM GOALS: Target date: 05/14/2022   Pt will be able to drive around the local area without increase in symptoms Baseline: natural light is better, I can drive here and be ok (<10 min) Goal status: ongoing   2.  Able to return to gentle exercise such as yoga Baseline:  Goal status: ongoing   3.  Pt will be able to demo proper bending and lifting form necessary for ADLs Baseline:  Goal status: INITIAL   4.  Further goals to be set as care progressed- dependent on concussion symptoms Baseline:  Goal status: INITIAL     PLAN: PT FREQUENCY: 2x/week   PT DURATION: 8 weeks   PLANNED INTERVENTIONS: Therapeutic exercises, Therapeutic activity, Neuromuscular re-education, Balance training, Gait training, Patient/Family education, Joint mobilization, Aquatic Therapy, Dry Needling, Electrical stimulation, Spinal mobilization, Cryotherapy, Moist heat, Taping, Manual therapy, and Re-evaluation   PLAN FOR NEXT SESSION: continue core stability and persicap strength  Kerin Perna, PTA 04/19/22 10:17 AM

## 2022-04-21 NOTE — Progress Notes (Signed)
  Subjective:  Patient ID: [name] [name], female    DOB: [birth year],  MRN: [id number]  Chief Complaint  Patient presents with   Ingrown Toenail    for ingrown nail removal, f/u on toe contusion, schedule tailor's bunion surgery    46 y.o. female presents with the above complaint. History confirmed with patient.  She says the injection was doing much better.  She would like to plan for surgery for right hip joint starting with the left foot.  The nail on the right hallux is ingrown and causing pain and she would like it removed  Objective:  Physical Exam: warm, good capillary refill, no trophic changes or ulcerative lesions, normal DP and PT pulses, and normal sensory exam. Left Foot:  Tailor's bunion deformity, no pain in the plantar fascia Right Foot:  Tailor's bunion deformity, no pain in the plantar fascia, ingrowing right hallux nail causing pain  No images are attached to the encounter.  Radiographs: Multiple views x-ray of the right foot: no fracture, dislocation, swelling or degenerative changes noted of the fifth toe she does have a tailor's bunion deformity Assessment:   1. Tailor's bunionette, bilateral   2. History of metal allergy   3. Ingrown nail   4. Contusion of fifth toe, right, initial encounter      Plan:  Patient was evaluated and treated and all questions answered.  We discussed the treatment options of tailor's bunion form including surgical and nonsurgical.  She has tried wider shoes and this has not helped.  She is interested in surgical correction.  We will start with the left foot so she can do 1 foot at a time.  I discussed with her the possibility of need for temporary percutaneous fixation.  She think she has a metal allergy but does not know what type of metal it is.  I have referred her to allergy immunology for evaluation of this prior to surgical consent.  We discussed the postoperative protocol and need for protected weightbearing after  surgery     Ingrown Nail, right -Patient elects to proceed with minor surgery to remove ingrown toenail today. Consent reviewed and signed by patient. -Ingrown nail excised. See procedure note. -Educated on post-procedure care including soaking. Written instructions provided and reviewed. -Patient to follow up in 2 weeks for nail check.  Procedure: Excision of Ingrown Toenail Location: Right 1st toe  medial and lateral  nail borders. Anesthesia: Lidocaine 1% plain; 1.5 mL and Marcaine 0.5% plain; 1.5 mL, digital block. Skin Prep: Betadine. Dressing: Silvadene; telfa; dry, sterile, compression dressing. Technique: Following skin prep, the toe was exsanguinated and a tourniquet was secured at the base of the toe. The affected nail border was freed, split with a nail splitter, and excised. Chemical matrixectomy was then performed with phenol and irrigated out with alcohol. The tourniquet was then removed and sterile dressing applied. Disposition: Patient tolerated procedure well. Patient to return in 2 weeks for follow-up.    Return in about 2 weeks (around 05/02/2022) for right nail check.

## 2022-04-23 ENCOUNTER — Encounter (HOSPITAL_BASED_OUTPATIENT_CLINIC_OR_DEPARTMENT_OTHER): Payer: Self-pay | Admitting: Physical Therapy

## 2022-04-23 ENCOUNTER — Ambulatory Visit (HOSPITAL_BASED_OUTPATIENT_CLINIC_OR_DEPARTMENT_OTHER): Payer: Medicare Other | Attending: Neurosurgery | Admitting: Physical Therapy

## 2022-04-23 DIAGNOSIS — M62830 Muscle spasm of back: Secondary | ICD-10-CM | POA: Diagnosis present

## 2022-04-23 DIAGNOSIS — M542 Cervicalgia: Secondary | ICD-10-CM | POA: Insufficient documentation

## 2022-04-23 DIAGNOSIS — M545 Low back pain, unspecified: Secondary | ICD-10-CM | POA: Insufficient documentation

## 2022-04-23 DIAGNOSIS — G8929 Other chronic pain: Secondary | ICD-10-CM | POA: Diagnosis present

## 2022-04-23 DIAGNOSIS — M546 Pain in thoracic spine: Secondary | ICD-10-CM | POA: Insufficient documentation

## 2022-04-23 NOTE — Therapy (Signed)
OUTPATIENT PHYSICAL THERAPY TREATMENT    Patient Name: [name] [name] MRN: [id number] DOB:Dec 25, 1975, 46 y.o., female Today's Date: 04/23/2022     PT End of Session - 04/23/22 1151     Visit Number 14    Number of Visits 17    Date for PT Re-Evaluation 05/11/22    Authorization Type MCR    PT Start Time 0905    PT Stop Time 0945    PT Time Calculation (min) 40 min    Activity Tolerance Patient tolerated treatment well    Behavior During Therapy [facility] for tasks assessed/performed                Past Medical History:  Diagnosis Date   Adenomatous colon polyp 2008   Anxiety 1999/2000   Arthritis    "jaw joints; neck" (64/12/3293)   Complication of anesthesia    "didn't wake up well/remembered stuff from during OR when I had pituitary surgery"   Constipation    Depression    hx   DVT (deep vein thrombosis) in pregnancy 09/2013   "LLE; postpartum"   Fibromyalgia    GERD (gastroesophageal reflux disease)    H/O hiatal hernia    Hematochezia    IBS (irritable bowel syndrome)    Internal hemorrhoids    Migraines    "couple times/wk" (07/28/2014)   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 Silvano Rusk   ESOPHAGOGASTRODUODENOSCOPY  09/29/2004   Dr. Kennedy Bucker   LAPAROSCOPIC APPENDECTOMY N/A 07/28/2014   Procedure: APPENDECTOMY LAPAROSCOPIC;  Surgeon: Autumn Messing III, MD;  Location: [place];  Service: General;  Laterality: N/A;   TRANSPHENOIDAL / TRANSNASAL HYPOPHYSECTOMY / RESECTION PITUITARY TUMOR  1999   resection of benign pituitary tumor, [place]  2008   Patient Active Problem List   Diagnosis Date Noted   Neck injury 02/18/2022   Chronic  migraine w/o aura w/o status migrainosus, not intractable 03/21/2021   Protrusion of lumbar intervertebral disc 03/09/2021   Depression 03/16/2020   Ptosis of right eyelid 11/22/2017   Abnormal brain MRI 01/30/2017   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   Pain 06/21/2016   Tick bite of left lower leg 06/21/2016   Chronic fatigue 06/21/2016   History of pituitary surgery 09/26/2015   ADHD (attention deficit hyperactivity disorder) 05/04/2015   Anxiety state 03/08/2015   Upper respiratory tract infection 11/30/2014   Acute appendicitis 07/28/2014   Other malaise and fatigue 03/09/2014   Unspecified vitamin D deficiency 03/09/2014   Postpartum care following vaginal delivery (12/18) 10/08/2013   Normal labor and delivery 10/08/2013   Chronic migraine 01/20/2013   Headache, chronic migraine without aura, intractable 03/18/2012   Myalgia and myositis 03/18/2012   Lumbar spondylosis 03/18/2012   PERSONAL HISTORY OF ALLERGY TO EGGS- GI SENSITIVITY 05/04/2010   CHANGE IN BOWELS 05/02/2010   IRRITABLE BOWEL SYNDROME 12/04/2009   NONSPECIFIC ABN FINDNG RAD&OTH EXAM BILARY TRCT 11/30/2009   GERD 01/31/2009   ABDOMINAL PAIN-EPIGASTRIC 01/31/2009   PERSONAL HX COLONIC POLYPS 12/14/2008   INTERNAL HEMORRHOIDS 01/03/2007   HIATAL HERNIA 09/29/2004   PCP: None   REFERRING PROVIDER: Kary Kos, MD  REFERRING DIAG: M54.2 (ICD-10-CM) - Neck pain   THERAPY DIAG:  Cervicalgia  Chronic midline low back pain without sciatica  Muscle spasm of back  Pain in thoracic spine  SUBJECTIVE:  "I felt like my SI popped yesterday and I feel better"  Are you in pain: Yes Pain scale: 6-7/10 sacrum/LB Location: Generalized, but especially in the sacrum    PERTINENT HISTORY:  See history listed   PAIN:  Are you having pain? Yes: 6/10  Pain location: sacrum Pain description: tight, locked Aggravating factors: being upright-gravity Relieving factors: laying down   PRECAUTIONS: Other: post  concussion   WEIGHT BEARING RESTRICTIONS No   FALLS:  Has patient fallen in last 6 months? No   LIVING ENVIRONMENT: Lives with:  kids   PLOF: Independent   PATIENT GOALS decrease pain, complete daily activities, get back to exercise   OBJECTIVE:    DIAGNOSTIC FINDINGS:  4/29 MRI IMPRESSION: 1. No acute fracture or other traumatic injury in the cervical spine. 2. C5-C6 mild right neural foraminal narrowing.         COGNITION: Overall cognitive status: Difficulty to assess- pt reports cognitive difficulties since the accident     SENSATION: WFL   POSTURE: found in lobby in bent over posture with head in chair and ambulated with flexed posture   PALPATION: Significant spasm in cervical and lumbopelvic region      04/12/22:  Rt ASIS lower than Lt (and tender) in standing    CERVICAL ROM:  grossly limited due to guarding at eval   Active ROM A/PROM (deg) eval  Flexion    Extension    Right lateral flexion    Left lateral flexion    Right rotation    Left rotation     (Blank rows = not tested)   UUPPER EXTREMITY MMT:   MMT Right eval Left eval  Shoulder flexion      Shoulder extension      Shoulder abduction      Shoulder adduction      Shoulder extension      Shoulder internal rotation      Shoulder external rotation      Middle trapezius      Lower trapezius      Elbow flexion      Elbow extension      Wrist flexion      Wrist extension      Wrist ulnar deviation      Wrist radial deviation      Wrist pronation      Wrist supination      Grip strength       (Blank rows = not tested)    TREATMENT: Pt donned sunglasses (light sensitivity)  Pt seen for aquatic therapy today.  Treatment took place in water 3.25-4.8 ft in depth at the [facility] pool. Temp of water was 91-104.  Pt entered/exited the pool via stairs  slowly but independently with bilat rail.    Forward/ backward walking in deepest water; high knee marching forward/  backward Quad/ hip flexor stretch with ankle on noodle Hamstring, ITB, adductor stretch with LE supported by thin sqoodle, back against wall Warrior 1 lifting noodle off of water / up towards ceiling x 5 each LE forward Warrior 2 Warrior 3 holding yellow noodle forward x 5 each LE  Plank on yellow noodle with pull downs x 7 Modified triangle pose x 5 each side Manual: deep pressure/massage suboccipitals/SCM    Once pt was dried off -  applied reg Rock tape in X pattern across sacrum    PATIENT EDUCATION:  Education details: Anatomy of condition, POC, HEP, exercise form/rationale. aquatics Person educated: Patient Education method: Explanation Education comprehension: verbalized understanding     HOME EXERCISE PROGRAM: [id number] - from past episode Hip extensor stretch in sup Prone on ball right hip pressure point Sub-occipital passive stretch    ASSESSMENT: Slight improvement in overall pain.  Concussion sypmtoms continue as evidenced by pt having difficulty coordinating some tasks (more so towards left vs right).  Repeated yoga poses with good response. Continued cues although minimal for neutral spine position.  Sub occipitals manipulation with deep pressure decreasing discomfort in area.      OBJECTIVE IMPAIRMENTS Abnormal gait, decreased activity tolerance, decreased knowledge of condition, difficulty walking, decreased ROM, increased muscle spasms, impaired flexibility, impaired UE functional use, impaired vision/preception, postural dysfunction, and pain.    ACTIVITY LIMITATIONS meal prep, cleaning, laundry, personal finances, driving, shopping, and community activity.    PERSONAL FACTORS 1-2 comorbidities: see history above  are also affecting patient's functional outcome.      REHAB POTENTIAL: Good   CLINICAL DECISION MAKING: Unstable/unpredictable   EVALUATION COMPLEXITY: High     GOALS: Goals reviewed with patient? Yes   SHORT TERM GOALS: Target date:  04/02/2022    Pt will verbalize proper rest through her day to reduce migraine symptoms Baseline:  Goal status: Achieved     LONG TERM GOALS: Target date: 05/14/2022   Pt will be able to drive around the local area without increase in symptoms Baseline: natural light is better, I can drive here and be ok (<10 min) Goal status: ongoing   2.  Able to return to gentle exercise such as yoga Baseline:  Goal status: ongoing   3.  Pt will be able to demo proper bending and lifting form necessary for ADLs Baseline:  Goal status: INITIAL   4.  Further goals to be set as care progressed- dependent on concussion symptoms Baseline:  Goal status: INITIAL     PLAN: PT FREQUENCY: 2x/week   PT DURATION: 8 weeks   PLANNED INTERVENTIONS: Therapeutic exercises, Therapeutic activity, Neuromuscular re-education, Balance training, Gait training, Patient/Family education, Joint mobilization, Aquatic Therapy, Dry Needling, Electrical stimulation, Spinal mobilization, Cryotherapy, Moist heat, Taping, Manual therapy, and Re-evaluation   PLAN FOR NEXT SESSION: continue core stability and persicap strength  Stanton Kidney Tharon Aquas) Mardy Lucier MPT 04/23/22 7:34 PM

## 2022-04-25 ENCOUNTER — Ambulatory Visit (HOSPITAL_BASED_OUTPATIENT_CLINIC_OR_DEPARTMENT_OTHER): Payer: Medicare Other | Admitting: Physical Therapy

## 2022-04-25 ENCOUNTER — Encounter (HOSPITAL_BASED_OUTPATIENT_CLINIC_OR_DEPARTMENT_OTHER): Payer: Self-pay | Admitting: Physical Therapy

## 2022-04-25 DIAGNOSIS — M542 Cervicalgia: Secondary | ICD-10-CM | POA: Diagnosis not present

## 2022-04-25 DIAGNOSIS — G8929 Other chronic pain: Secondary | ICD-10-CM

## 2022-04-25 DIAGNOSIS — M62830 Muscle spasm of back: Secondary | ICD-10-CM

## 2022-04-25 NOTE — Therapy (Signed)
OUTPATIENT PHYSICAL THERAPY TREATMENT    Patient Name: [name] [name] MRN: [id number] DOB:February 09, 1976, 46 y.o., female Today's Date: 04/25/2022       PT End of Session - 04/25/22 0851     Visit Number 15    Number of Visits 31    Date for PT Re-Evaluation 06/20/22    Authorization Type MCR    Progress Note Due on Visit 25    PT Start Time 0850    PT Stop Time 0940    PT Time Calculation (min) 50 min    Activity Tolerance Patient tolerated treatment well    Behavior During Therapy [facility] for tasks assessed/performed                Past Medical History:  Diagnosis Date   Adenomatous colon polyp 2008   Anxiety 1999/2000   Arthritis    "jaw joints; neck" (23/04/6282)   Complication of anesthesia    "didn't wake up well/remembered stuff from during OR when I had pituitary surgery"   Constipation    Depression    hx   DVT (deep vein thrombosis) in pregnancy 09/2013   "LLE; postpartum"   Fibromyalgia    GERD (gastroesophageal reflux disease)    H/O hiatal hernia    Hematochezia    IBS (irritable bowel syndrome)    Internal hemorrhoids    Migraines    "couple times/wk" (07/28/2014)   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 Silvano Rusk   ESOPHAGOGASTRODUODENOSCOPY  09/29/2004   Dr. Kennedy Bucker   LAPAROSCOPIC APPENDECTOMY N/A 07/28/2014   Procedure: APPENDECTOMY LAPAROSCOPIC;  Surgeon: Autumn Messing III, MD;  Location: [place];  Service: General;  Laterality: N/A;   TRANSPHENOIDAL / TRANSNASAL HYPOPHYSECTOMY / RESECTION PITUITARY TUMOR  1999   resection of benign pituitary tumor, [place]  2008   Patient Active Problem List   Diagnosis Date Noted    Neck injury 02/18/2022   Chronic migraine w/o aura w/o status migrainosus, not intractable 03/21/2021   Protrusion of lumbar intervertebral disc 03/09/2021   Depression 03/16/2020   Ptosis of right eyelid 11/22/2017   Abnormal brain MRI 01/30/2017   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   Pain 06/21/2016   Tick bite of left lower leg 06/21/2016   Chronic fatigue 06/21/2016   History of pituitary surgery 09/26/2015   ADHD (attention deficit hyperactivity disorder) 05/04/2015   Anxiety state 03/08/2015   Upper respiratory tract infection 11/30/2014   Acute appendicitis 07/28/2014   Other malaise and fatigue 03/09/2014   Unspecified vitamin D deficiency 03/09/2014   Postpartum care following vaginal delivery (12/18) 10/08/2013   Normal labor and delivery 10/08/2013   Chronic migraine 01/20/2013   Headache, chronic migraine without aura, intractable 03/18/2012   Myalgia and myositis 03/18/2012   Lumbar spondylosis 03/18/2012   PERSONAL HISTORY OF ALLERGY TO EGGS- GI SENSITIVITY 05/04/2010   CHANGE IN BOWELS 05/02/2010   IRRITABLE BOWEL SYNDROME 12/04/2009   NONSPECIFIC ABN FINDNG RAD&OTH EXAM BILARY TRCT 11/30/2009   GERD 01/31/2009   ABDOMINAL PAIN-EPIGASTRIC 01/31/2009   PERSONAL HX COLONIC POLYPS 12/14/2008   INTERNAL HEMORRHOIDS 01/03/2007   HIATAL HERNIA 09/29/2004  PCP: None   REFERRING PROVIDER: Kary Kos, MD   REFERRING DIAG: M54.2 (ICD-10-CM) - Neck pain   THERAPY DIAG:  Cervicalgia  Chronic midline low back pain without sciatica  Muscle spasm of back  Progress Note Reporting Period 03/22/22 to 04/25/2022   See note below for Objective Data and Assessment of Progress/Goals.    SUBJECTIVE:  As far as physical function I almost feel stronger because we are working on certain muscles. The brain function is what is affecting everything and is still limiting.     PERTINENT HISTORY:  See history listed   PAIN:  Are you having pain? Yes: 6/10  Pain location:  length of the spine Pain description: compressed Aggravating factors: being upright-gravity Relieving factors: laying down   PRECAUTIONS: Other: post concussion   WEIGHT BEARING RESTRICTIONS No   FALLS:  Has patient fallen in last 6 months? No   LIVING ENVIRONMENT: Lives with:  kids   PLOF: Independent   PATIENT GOALS decrease pain, complete daily activities, get back to exercise   OBJECTIVE:    DIAGNOSTIC FINDINGS:  4/29 MRI IMPRESSION: 1. No acute fracture or other traumatic injury in the cervical spine. 2. C5-C6 mild right neural foraminal narrowing.         COGNITION: Overall cognitive status: Difficulty to assess- pt reports cognitive difficulties since the accident    POSTURE:   EVAL: found in lobby in bent over posture with head in chair and ambulated with flexed posture  04/25/22: pt demo upright posture at rest, shifting side to side occasionally while sitting on table. Had to move to hooklying position about 30 min into appointment. Avoids standing fully upright from a seated position when in pain but is able to do so when cued.    PALPATION: EVAL: Significant spasm in cervical and lumbopelvic region      04/12/22:  Rt ASIS lower than Lt (and tender) in standing    CERVICAL ROM:    04/25/22: pt demo cervical ROM within functional limits but facial expressions of pain/discomfort while moving to end ranges    UUPPER EXTREMITY MMT:   MMT Right eval Left eval  Shoulder flexion      Shoulder extension      Shoulder abduction      Shoulder adduction      Shoulder extension      Shoulder internal rotation      Shoulder external rotation      Middle trapezius      Lower trapezius      Elbow flexion      Elbow extension      Wrist flexion      Wrist extension      Wrist ulnar deviation      Wrist radial deviation      Wrist pronation      Wrist supination      Grip strength       (Blank rows = not tested)    TREATMENT: 7/5: Focused on  education- see plan Hot tub moist heat 10 min end of session   PATIENT EDUCATION:  Education details: Anatomy of condition, POC, HEP Person educated: Patient Education method: Explanation Education comprehension: verbalized understanding     HOME EXERCISE PROGRAM: [id number] - from past episode Hip extensor stretch in sup Prone on ball right hip pressure point Sub-occipital passive stretch    ASSESSMENT:  Pt is making excellent progress in her functional movement and awareness of her posture. She is most limited at this point  by neurological influence creating hypersensivity and high pain levels. Her stress increases very quickly with inputs of sight, sound and smell and manifests as severe increase in MSK pain. Session today focused on pain neuroscience education and self care that needs to be associated in her ADLs to improve function. Asked her to start writing a list daily of what has to be done, what would be great to be done and what her daughters can help her do. We discussed rest breaks being necessary frequently through the day to heal rather than get into levels of severe pain. POC was extended to continue exercises in the pool and she will work on incorporating self care discussed today.      OBJECTIVE IMPAIRMENTS Abnormal gait, decreased activity tolerance, decreased knowledge of condition, difficulty walking, decreased ROM, increased muscle spasms, impaired flexibility, impaired UE functional use, impaired vision/preception, postural dysfunction, and pain.    ACTIVITY LIMITATIONS meal prep, cleaning, laundry, personal finances, driving, shopping, and community activity.    PERSONAL FACTORS 1-2 comorbidities: see history above  are also affecting patient's functional outcome.      REHAB POTENTIAL: Good   CLINICAL DECISION MAKING: Unstable/unpredictable   EVALUATION COMPLEXITY: High     GOALS: Goals reviewed with patient? Yes   SHORT TERM GOALS: Target date:  04/02/2022    Pt will verbalize proper rest through her day to reduce migraine symptoms Baseline:  Goal status: Achieved     LONG TERM GOALS: Target date: 06/20/22   Pt will be able to drive around the local area without increase in symptoms Baseline: natural light is better, I can drive here and be ok (<10 min) Goal status: ongoing   2.  Able to return to gentle exercise such as yoga Baseline:  Goal status: ongoing   3.  Pt will be able to demo proper bending and lifting form necessary for ADLs Baseline:  Goal status: ongoing, able to perform motion but required cues to avoid compensation   4.  pt will use regular rest breaks to maintain a physical pain level <=4/10 Baseline:  Goal status: new     PLAN: PT FREQUENCY: 2x/week   PT DURATION: 8 weeks   PLANNED INTERVENTIONS: Therapeutic exercises, Therapeutic activity, Neuromuscular re-education, Balance training, Gait training, Patient/Family education, Joint mobilization, Aquatic Therapy, Dry Needling, Electrical stimulation, Spinal mobilization, Cryotherapy, Moist heat, Taping, Manual therapy, and Re-evaluation   PLAN FOR NEXT SESSION: continue general stability training in pool. Did she start   Cesar Chavez. Kylinn Shropshire PT, DPT 04/25/22 11:24 AM

## 2022-04-30 ENCOUNTER — Telehealth: Payer: Self-pay | Admitting: Podiatry

## 2022-04-30 MED ORDER — NEOMYCIN-POLYMYXIN-HC 1 % OT SOLN: OTIC | 0 refills | Status: DC

## 2022-04-30 NOTE — Addendum Note (Signed)
Addended bySherryle Lis, Theora Vankirk R on: 04/30/2022 02:00 PM   Modules accepted: Orders

## 2022-04-30 NOTE — Telephone Encounter (Signed)
"  I had a procedure for an ingrown toenail done last week by Dr. Sherryle Lis.  I thought there was going to be a prescription medication called in.  My toe does seem to be infected.  If it hasn't been called in, could you call it in today to the [facility]./

## 2022-05-01 ENCOUNTER — Ambulatory Visit: Payer: Medicare Other | Admitting: Podiatry

## 2022-05-01 ENCOUNTER — Encounter (HOSPITAL_BASED_OUTPATIENT_CLINIC_OR_DEPARTMENT_OTHER): Payer: Self-pay | Admitting: Physical Therapy

## 2022-05-01 ENCOUNTER — Ambulatory Visit (HOSPITAL_BASED_OUTPATIENT_CLINIC_OR_DEPARTMENT_OTHER): Payer: Medicare Other | Admitting: Physical Therapy

## 2022-05-01 DIAGNOSIS — M542 Cervicalgia: Secondary | ICD-10-CM | POA: Diagnosis not present

## 2022-05-01 DIAGNOSIS — M62830 Muscle spasm of back: Secondary | ICD-10-CM

## 2022-05-01 DIAGNOSIS — M546 Pain in thoracic spine: Secondary | ICD-10-CM

## 2022-05-01 DIAGNOSIS — M545 Low back pain, unspecified: Secondary | ICD-10-CM
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
## 2022-05-03 ENCOUNTER — Encounter (HOSPITAL_BASED_OUTPATIENT_CLINIC_OR_DEPARTMENT_OTHER): Payer: Self-pay | Admitting: Physical Therapy

## 2022-05-03 ENCOUNTER — Ambulatory Visit (HOSPITAL_BASED_OUTPATIENT_CLINIC_OR_DEPARTMENT_OTHER): Payer: Medicare Other | Admitting: Physical Therapy

## 2022-05-03 DIAGNOSIS — M542 Cervicalgia: Secondary | ICD-10-CM

## 2022-05-03 DIAGNOSIS — G8929 Other chronic pain: Secondary | ICD-10-CM

## 2022-05-03 DIAGNOSIS — M546 Pain in thoracic spine: Secondary | ICD-10-CM

## 2022-05-03 DIAGNOSIS — M62830 Muscle spasm of back: Secondary | ICD-10-CM
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
## 2022-05-04 ENCOUNTER — Ambulatory Visit (HOSPITAL_BASED_OUTPATIENT_CLINIC_OR_DEPARTMENT_OTHER): Payer: Self-pay | Admitting: Physical Therapy

## 2022-05-08 ENCOUNTER — Ambulatory Visit (HOSPITAL_BASED_OUTPATIENT_CLINIC_OR_DEPARTMENT_OTHER): Payer: Medicare Other | Admitting: Physical Therapy

## 2022-05-08 ENCOUNTER — Ambulatory Visit (INDEPENDENT_AMBULATORY_CARE_PROVIDER_SITE_OTHER): Payer: Medicare Other | Admitting: Podiatry

## 2022-05-08 DIAGNOSIS — L600 Ingrowing nail: Secondary | ICD-10-CM | POA: Diagnosis not present

## 2022-05-08 NOTE — Progress Notes (Signed)
  Subjective:  Patient ID: [name] [name], female    DOB: 11/17/1975,  MRN: [id number]  Chief Complaint  Patient presents with   Routine Post Op   Ingrown Toenail     46 y.o. female returns for follow-up on ingrown toenail removal  Review of Systems: Negative except as noted in the HPI. Denies N/V/F/Ch.   Objective:  There were no vitals filed for this visit. There is no height or weight on file to calculate BMI. Constitutional Well developed. Well nourished.  Vascular Foot warm and well perfused. Capillary refill normal to all digits.  Calf is soft and supple, no posterior calf or knee pain, negative Homans' sign  Neurologic Normal speech. Oriented to person, place, and time. Epicritic sensation to light touch grossly present bilaterally.  Dermatologic Matricectomy site is healing well.  Mild erythema  Orthopedic: Tenderness to palpation noted about the surgical site.   Assessment:   1. Ingrown nail    Plan:  Patient was evaluated and treated and all questions answered.  Matricectomy site and ingrown nail removal is healing well.  There is some erythema which I suspect is secondary to the phenolic acid.  There are no signs of infection currently.  She will return as needed if this has further issues or new issues develop.  She will let me know the results of the allergy testing when we are ready to proceed with tailor's bunionectomy.

## 2022-05-11 ENCOUNTER — Encounter (HOSPITAL_BASED_OUTPATIENT_CLINIC_OR_DEPARTMENT_OTHER): Payer: Self-pay | Admitting: Physical Therapy

## 2022-05-11 ENCOUNTER — Ambulatory Visit (HOSPITAL_BASED_OUTPATIENT_CLINIC_OR_DEPARTMENT_OTHER): Payer: Medicare Other | Admitting: Physical Therapy

## 2022-05-11 DIAGNOSIS — M542 Cervicalgia: Secondary | ICD-10-CM

## 2022-05-11 DIAGNOSIS — M62830 Muscle spasm of back: Secondary | ICD-10-CM

## 2022-05-11 DIAGNOSIS — G8929 Other chronic pain: Secondary | ICD-10-CM
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
## 2022-05-18 ENCOUNTER — Encounter (HOSPITAL_BASED_OUTPATIENT_CLINIC_OR_DEPARTMENT_OTHER): Payer: Self-pay

## 2022-05-18 ENCOUNTER — Ambulatory Visit (HOSPITAL_BASED_OUTPATIENT_CLINIC_OR_DEPARTMENT_OTHER): Payer: Self-pay | Admitting: Physical Therapy

## 2022-05-23 ENCOUNTER — Ambulatory Visit (HOSPITAL_BASED_OUTPATIENT_CLINIC_OR_DEPARTMENT_OTHER): Payer: Medicare Other | Attending: Neurosurgery | Admitting: Physical Therapy

## 2022-05-23 ENCOUNTER — Encounter (HOSPITAL_BASED_OUTPATIENT_CLINIC_OR_DEPARTMENT_OTHER): Payer: Self-pay | Admitting: Physical Therapy

## 2022-05-23 DIAGNOSIS — M545 Low back pain, unspecified: Secondary | ICD-10-CM | POA: Diagnosis present

## 2022-05-23 DIAGNOSIS — S32009A Unspecified fracture of unspecified lumbar vertebra, initial encounter for closed fracture: Secondary | ICD-10-CM | POA: Insufficient documentation

## 2022-05-23 DIAGNOSIS — G8929 Other chronic pain: Secondary | ICD-10-CM | POA: Diagnosis present

## 2022-05-23 DIAGNOSIS — M62830 Muscle spasm of back: Secondary | ICD-10-CM | POA: Diagnosis present

## 2022-05-23 DIAGNOSIS — M542 Cervicalgia: Secondary | ICD-10-CM | POA: Insufficient documentation

## 2022-05-23 DIAGNOSIS — M546 Pain in thoracic spine: Secondary | ICD-10-CM | POA: Insufficient documentation
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
## 2022-05-25 ENCOUNTER — Encounter (HOSPITAL_BASED_OUTPATIENT_CLINIC_OR_DEPARTMENT_OTHER): Payer: Self-pay | Admitting: Physical Therapy

## 2022-05-25 ENCOUNTER — Ambulatory Visit (HOSPITAL_BASED_OUTPATIENT_CLINIC_OR_DEPARTMENT_OTHER): Payer: Medicare Other | Admitting: Physical Therapy

## 2022-05-25 DIAGNOSIS — M542 Cervicalgia: Secondary | ICD-10-CM | POA: Diagnosis not present

## 2022-05-25 DIAGNOSIS — M545 Low back pain, unspecified: Secondary | ICD-10-CM

## 2022-05-25 DIAGNOSIS — M62830 Muscle spasm of back: Secondary | ICD-10-CM

## 2022-05-25 DIAGNOSIS — M546 Pain in thoracic spine: Secondary | ICD-10-CM
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
## 2022-05-28 ENCOUNTER — Ambulatory Visit (HOSPITAL_BASED_OUTPATIENT_CLINIC_OR_DEPARTMENT_OTHER): Payer: Medicare Other | Admitting: Physical Therapy

## 2022-05-31 ENCOUNTER — Ambulatory Visit (HOSPITAL_BASED_OUTPATIENT_CLINIC_OR_DEPARTMENT_OTHER): Payer: Self-pay | Admitting: Physical Therapy

## 2022-06-04 ENCOUNTER — Ambulatory Visit (HOSPITAL_BASED_OUTPATIENT_CLINIC_OR_DEPARTMENT_OTHER): Payer: Medicare Other | Admitting: Physical Therapy

## 2022-06-07 ENCOUNTER — Ambulatory Visit (HOSPITAL_BASED_OUTPATIENT_CLINIC_OR_DEPARTMENT_OTHER): Payer: Self-pay | Admitting: Physical Therapy

## 2022-06-11 ENCOUNTER — Encounter: Payer: Self-pay | Admitting: Neurology

## 2022-06-11 ENCOUNTER — Other Ambulatory Visit: Payer: Self-pay | Admitting: Neurology

## 2022-06-11 ENCOUNTER — Ambulatory Visit (INDEPENDENT_AMBULATORY_CARE_PROVIDER_SITE_OTHER): Payer: Medicare Other | Admitting: Neurology

## 2022-06-11 VITALS — BP 103/66 | HR 70 | Ht 66.0 in | Wt 145.0 lb

## 2022-06-11 DIAGNOSIS — F419 Anxiety disorder, unspecified: Secondary | ICD-10-CM | POA: Diagnosis not present

## 2022-06-11 DIAGNOSIS — G43709 Chronic migraine without aura, not intractable, without status migrainosus: Secondary | ICD-10-CM | POA: Diagnosis not present

## 2022-06-11 DIAGNOSIS — G4710 Hypersomnia, unspecified: Secondary | ICD-10-CM | POA: Diagnosis not present

## 2022-06-11 DIAGNOSIS — M545 Low back pain, unspecified: Secondary | ICD-10-CM | POA: Insufficient documentation

## 2022-06-11 DIAGNOSIS — R5383 Other fatigue: Secondary | ICD-10-CM | POA: Insufficient documentation

## 2022-06-11 MED ORDER — HYDROXYZINE HCL 10 MG PO TABS
10.0000 mg | ORAL_TABLET | ORAL | 0 refills | Status: DC | PRN
Start: 2022-06-11 — End: 2022-10-24

## 2022-06-11 MED ORDER — ONABOTULINUMTOXINA 100 UNITS IJ SOLR
200.0000 [IU] | Freq: Once | INTRAMUSCULAR | Status: AC
  Administered 2022-06-11: 200 [IU] via INTRAMUSCULAR

## 2022-06-11 MED ORDER — SUMATRIPTAN SUCCINATE 100 MG PO TABS: 100.0000 mg | ORAL_TABLET | ORAL | 11 refills | Status: DC | PRN

## 2022-06-11 MED ORDER — ONABOTULINUMTOXINA 200 UNITS IJ SOLR: 200.0000 [IU] | Freq: Once | INTRAMUSCULAR | Status: DC
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
## 2022-06-11 NOTE — Progress Notes (Signed)
Botox 200 x 1 vial [id number] Exp-11/2024 B/b

## 2022-06-12 LAB — ANA W/REFLEX IF POSITIVE: Anti Nuclear Antibody (ANA): NEGATIVE

## 2022-06-12 LAB — IRON,TIBC AND FERRITIN PANEL
Ferritin: 19 ng/mL (ref 15–150)
Iron Saturation: 8 % — CL (ref 15–55)
Iron: 25 ug/dL — ABNORMAL LOW (ref 27–159)
Total Iron Binding Capacity: 321 ug/dL (ref 250–450)
UIBC: 296 ug/dL (ref 131–425)

## 2022-06-12 LAB — SEDIMENTATION RATE: Sed Rate: 2 mm/hr (ref 0–32)

## 2022-06-12 LAB — TSH: TSH: 1.43 u[IU]/mL (ref 0.450–4.500)

## 2022-06-12 LAB — LYME DISEASE SEROLOGY W/REFLEX: Lyme Total Antibody EIA: NEGATIVE

## 2022-06-12 LAB — C-REACTIVE PROTEIN: CRP: <1 mg/L (ref 0–10)

## 2022-06-15 ENCOUNTER — Encounter (HOSPITAL_COMMUNITY): Payer: Self-pay | Admitting: Emergency Medicine

## 2022-06-15 ENCOUNTER — Inpatient Hospital Stay (HOSPITAL_COMMUNITY)
Admission: EM | Admit: 2022-06-15 | Discharge: 2022-06-18 | DRG: 690 | Disposition: A | Discharge status: Home Health | Payer: Medicare Other | Attending: Internal Medicine | Admitting: Internal Medicine

## 2022-06-15 ENCOUNTER — Emergency Department (HOSPITAL_COMMUNITY): Payer: Medicare Other

## 2022-06-15 ENCOUNTER — Encounter (HOSPITAL_BASED_OUTPATIENT_CLINIC_OR_DEPARTMENT_OTHER): Payer: Self-pay | Admitting: Physical Therapy

## 2022-06-15 ENCOUNTER — Observation Stay (HOSPITAL_COMMUNITY): Payer: Medicare Other

## 2022-06-15 ENCOUNTER — Other Ambulatory Visit: Payer: Self-pay

## 2022-06-15 DIAGNOSIS — M545 Low back pain, unspecified: Secondary | ICD-10-CM

## 2022-06-15 DIAGNOSIS — G8929 Other chronic pain: Secondary | ICD-10-CM | POA: Diagnosis present

## 2022-06-15 DIAGNOSIS — N10 Acute tubulo-interstitial nephritis: Principal | ICD-10-CM | POA: Diagnosis present

## 2022-06-15 DIAGNOSIS — S199XXS Unspecified injury of neck, sequela: Principal | ICD-10-CM

## 2022-06-15 DIAGNOSIS — N12 Tubulo-interstitial nephritis, not specified as acute or chronic: Secondary | ICD-10-CM

## 2022-06-15 DIAGNOSIS — J302 Other seasonal allergic rhinitis: Secondary | ICD-10-CM | POA: Diagnosis present

## 2022-06-15 DIAGNOSIS — Z887 Allergy status to serum and vaccine status: Secondary | ICD-10-CM

## 2022-06-15 DIAGNOSIS — M1990 Unspecified osteoarthritis, unspecified site: Secondary | ICD-10-CM | POA: Diagnosis present

## 2022-06-15 DIAGNOSIS — M797 Fibromyalgia: Secondary | ICD-10-CM | POA: Diagnosis present

## 2022-06-15 DIAGNOSIS — Z888 Allergy status to other drugs, medicaments and biological substances status: Secondary | ICD-10-CM

## 2022-06-15 DIAGNOSIS — M5126 Other intervertebral disc displacement, lumbar region: Secondary | ICD-10-CM

## 2022-06-15 DIAGNOSIS — F419 Anxiety disorder, unspecified: Secondary | ICD-10-CM | POA: Diagnosis present

## 2022-06-15 DIAGNOSIS — Z91012 Allergy to eggs: Secondary | ICD-10-CM

## 2022-06-15 DIAGNOSIS — Z86718 Personal history of other venous thrombosis and embolism: Secondary | ICD-10-CM

## 2022-06-15 DIAGNOSIS — K219 Gastro-esophageal reflux disease without esophagitis: Secondary | ICD-10-CM | POA: Diagnosis present

## 2022-06-15 DIAGNOSIS — M419 Scoliosis, unspecified: Secondary | ICD-10-CM | POA: Diagnosis present

## 2022-06-15 DIAGNOSIS — R5382 Chronic fatigue, unspecified: Secondary | ICD-10-CM

## 2022-06-15 DIAGNOSIS — S32039A Unspecified fracture of third lumbar vertebra, initial encounter for closed fracture: Secondary | ICD-10-CM | POA: Diagnosis present

## 2022-06-15 DIAGNOSIS — W14XXXA Fall from tree, initial encounter: Secondary | ICD-10-CM

## 2022-06-15 DIAGNOSIS — M5136 Other intervertebral disc degeneration, lumbar region: Secondary | ICD-10-CM | POA: Diagnosis present

## 2022-06-15 DIAGNOSIS — Z91040 Latex allergy status: Secondary | ICD-10-CM

## 2022-06-15 DIAGNOSIS — R1013 Epigastric pain: Secondary | ICD-10-CM

## 2022-06-15 DIAGNOSIS — R32 Unspecified urinary incontinence: Secondary | ICD-10-CM | POA: Diagnosis present

## 2022-06-15 DIAGNOSIS — F32A Depression, unspecified: Secondary | ICD-10-CM | POA: Diagnosis present

## 2022-06-15 DIAGNOSIS — Z79899 Other long term (current) drug therapy: Secondary | ICD-10-CM

## 2022-06-15 DIAGNOSIS — M47816 Spondylosis without myelopathy or radiculopathy, lumbar region: Secondary | ICD-10-CM

## 2022-06-15 DIAGNOSIS — S32009A Unspecified fracture of unspecified lumbar vertebra, initial encounter for closed fracture: Secondary | ICD-10-CM

## 2022-06-15 DIAGNOSIS — Z886 Allergy status to analgesic agent status: Secondary | ICD-10-CM

## 2022-06-15 LAB — CBC WITH DIFFERENTIAL/PLATELET
Abs Immature Granulocytes: 0.08 10*3/uL — ABNORMAL HIGH (ref 0.00–0.07)
Basophils Absolute: 0 10*3/uL (ref 0.0–0.1)
Basophils Relative: 0 %
Eosinophils Absolute: 0 10*3/uL (ref 0.0–0.5)
Eosinophils Relative: 0 %
HCT: 39.1 % (ref 36.0–46.0)
Hemoglobin: 12.3 g/dL (ref 12.0–15.0)
Immature Granulocytes: 1 %
Lymphocytes Relative: 5 %
Lymphs Abs: 0.7 10*3/uL (ref 0.7–4.0)
MCH: 28.6 pg (ref 26.0–34.0)
MCHC: 31.5 g/dL (ref 30.0–36.0)
MCV: 90.9 fL (ref 80.0–100.0)
Monocytes Absolute: 1 10*3/uL (ref 0.1–1.0)
Monocytes Relative: 7 %
Neutro Abs: 11.9 10*3/uL — ABNORMAL HIGH (ref 1.7–7.7)
Neutrophils Relative %: 87 %
Platelets: 273 10*3/uL (ref 150–400)
RBC: 4.3 MIL/uL (ref 3.87–5.11)
RDW: 13.9 % (ref 11.5–15.5)
WBC: 13.7 10*3/uL — ABNORMAL HIGH (ref 4.0–10.5)
nRBC: 0 % (ref 0.0–0.2)

## 2022-06-15 LAB — COMPREHENSIVE METABOLIC PANEL
ALT: 23 U/L (ref 0–44)
AST: 27 U/L (ref 15–41)
Albumin: 4.3 g/dL (ref 3.5–5.0)
Alkaline Phosphatase: 36 U/L — ABNORMAL LOW (ref 38–126)
Anion gap: 8 (ref 5–15)
BUN: 12 mg/dL (ref 6–20)
CO2: 26 mmol/L (ref 22–32)
Calcium: 9.2 mg/dL (ref 8.9–10.3)
Chloride: 105 mmol/L (ref 98–111)
Creatinine, Ser: 0.7 mg/dL (ref 0.44–1.00)
GFR, Estimated: >60 mL/min (ref >60)
Glucose, Bld: 111 mg/dL — ABNORMAL HIGH (ref 70–99)
Potassium: 3.6 mmol/L (ref 3.5–5.1)
Sodium: 139 mmol/L (ref 135–145)
Total Bilirubin: 0.6 mg/dL (ref 0.3–1.2)
Total Protein: 7.2 g/dL (ref 6.5–8.1)

## 2022-06-15 LAB — URINALYSIS, ROUTINE W REFLEX MICROSCOPIC
Bilirubin Urine: NEGATIVE
Glucose, UA: NEGATIVE mg/dL
Ketones, ur: NEGATIVE mg/dL
Nitrite: POSITIVE — AB
Protein, ur: 100 mg/dL — AB
Specific Gravity, Urine: 1.015 (ref 1.005–1.030)
WBC, UA: >50 WBC/hpf — ABNORMAL HIGH (ref 0–5)
pH: 6 (ref 5.0–8.0)

## 2022-06-15 LAB — LIPASE, BLOOD: Lipase: 24 U/L (ref 11–51)

## 2022-06-15 LAB — PREGNANCY, URINE: Preg Test, Ur: NEGATIVE

## 2022-06-15 MED ORDER — ALBUTEROL SULFATE (2.5 MG/3ML) 0.083% IN NEBU
2.5000 mg | INHALATION_SOLUTION | Freq: Four times a day (QID) | RESPIRATORY_TRACT | Status: DC
  Administered 2022-06-15: 2.5 mg via RESPIRATORY_TRACT
  Filled 2022-06-15: qty 3

## 2022-06-15 MED ORDER — SODIUM CHLORIDE 0.9 % IV SOLN
1.0000 g | INTRAVENOUS | Status: DC
  Administered 2022-06-15 – 2022-06-16 (×2): 1 g via INTRAVENOUS
  Filled 2022-06-15 (×3): qty 10

## 2022-06-15 MED ORDER — ONDANSETRON 4 MG PO TBDP
4.0000 mg | ORAL_TABLET | Freq: Once | ORAL | Status: AC
  Administered 2022-06-15: 4 mg via ORAL
  Filled 2022-06-15: qty 1

## 2022-06-15 MED ORDER — KETOROLAC TROMETHAMINE 30 MG/ML IJ SOLN
15.0000 mg | Freq: Once | INTRAMUSCULAR | Status: DC
  Filled 2022-06-15: qty 1

## 2022-06-15 MED ORDER — KETOROLAC TROMETHAMINE 30 MG/ML IJ SOLN: 30.0000 mg | Freq: Once | INTRAMUSCULAR | Status: DC

## 2022-06-15 MED ORDER — HYDRALAZINE HCL 20 MG/ML IJ SOLN: 10.0000 mg | Freq: Four times a day (QID) | INTRAMUSCULAR | Status: DC | PRN

## 2022-06-15 MED ORDER — METHOCARBAMOL 1000 MG/10ML IJ SOLN: 500.0000 mg | Freq: Four times a day (QID) | INTRAVENOUS | Status: DC | PRN

## 2022-06-15 MED ORDER — RIZATRIPTAN BENZOATE 10 MG PO TBDP: 10.0000 mg | ORAL_TABLET | ORAL | Status: DC | PRN

## 2022-06-15 MED ORDER — ENOXAPARIN SODIUM 40 MG/0.4ML IJ SOSY
40.0000 mg | PREFILLED_SYRINGE | INTRAMUSCULAR | Status: DC
  Filled 2022-06-15: qty 0.4

## 2022-06-15 MED ORDER — HYDROMORPHONE HCL 1 MG/ML IJ SOLN
1.0000 mg | INTRAMUSCULAR | Status: DC | PRN
  Administered 2022-06-15 – 2022-06-16 (×2): 1 mg via INTRAVENOUS
  Filled 2022-06-15 (×2): qty 1

## 2022-06-15 MED ORDER — CEFDINIR 300 MG PO CAPS: 300.0000 mg | ORAL_CAPSULE | Freq: Two times a day (BID) | ORAL | 0 refills | Status: DC

## 2022-06-15 MED ORDER — SODIUM CHLORIDE 0.9 % IV BOLUS
1000.0000 mL | Freq: Once | INTRAVENOUS | Status: AC
  Administered 2022-06-15: 1000 mL via INTRAVENOUS

## 2022-06-15 MED ORDER — KETOROLAC TROMETHAMINE 15 MG/ML IJ SOLN
15.0000 mg | Freq: Once | INTRAMUSCULAR | Status: AC
  Administered 2022-06-15: 15 mg via INTRAVENOUS

## 2022-06-15 MED ORDER — IOHEXOL 300 MG/ML  SOLN
100.0000 mL | Freq: Once | INTRAMUSCULAR | Status: AC | PRN
  Administered 2022-06-15: 100 mL via INTRAVENOUS

## 2022-06-15 MED ORDER — SUMATRIPTAN SUCCINATE 25 MG PO TABS: 100.0000 mg | ORAL_TABLET | ORAL | Status: DC | PRN

## 2022-06-15 MED ORDER — OXYCODONE HCL 5 MG PO TABS
5.0000 mg | ORAL_TABLET | ORAL | Status: DC | PRN
  Administered 2022-06-15: 5 mg via ORAL
  Filled 2022-06-15: qty 1

## 2022-06-15 MED ORDER — OXYCODONE HCL 5 MG PO TABS
10.0000 mg | ORAL_TABLET | Freq: Once | ORAL | Status: AC
  Administered 2022-06-15: 10 mg via ORAL
  Filled 2022-06-15: qty 2

## 2022-06-15 MED ORDER — MORPHINE SULFATE (PF) 4 MG/ML IV SOLN
4.0000 mg | Freq: Once | INTRAVENOUS | Status: AC
  Administered 2022-06-15: 4 mg via INTRAVENOUS
  Filled 2022-06-15: qty 1

## 2022-06-15 MED ORDER — ONDANSETRON HCL 4 MG/2ML IJ SOLN
4.0000 mg | Freq: Once | INTRAMUSCULAR | Status: AC
  Administered 2022-06-15: 4 mg via INTRAVENOUS
  Filled 2022-06-15: qty 2

## 2022-06-15 NOTE — ED Triage Notes (Signed)
BIBA Per EMS: pt coming from [facility] w/ c/o lower r back pain that radiates to abd. Hx bulging disc. Hx ovarian cyst.  VSS 110/60 72 HR  98% RA

## 2022-06-15 NOTE — ED Provider Notes (Signed)
[facility] DEPT Provider Note   CSN: [id number] Arrival date & time: 06/15/22  1200     History  Chief Complaint  Patient presents with   Abdominal Pain   Back Pain    [name] [name] is a 46 y.o. female with history of scoliosis, bulging disc, stable ovarian cyst, chronic migraine and fibromyalgia presenting for abdominal and back pain.  Pain started few days ago. She stated that she climbed a tree and fell on a lamp a couple days prior to the pain.  Pain is located in the right lower aspect of her back and extends midline and down her right buttock and into the right lower quadrant.  Pain is severe such that she is having difficulty walking at times is on her hands and knees because she cannot stand.  Has tried heat and ice but pain is not improved.  Does endorse abnormal vaginal bleeding a week ago but has since resolved.    Abdominal Pain Back Pain Associated symptoms: abdominal pain        Home Medications Prior to Admission medications   Medication Sig Start Date End Date Taking? Authorizing Provider  amphetamine-dextroamphetamine (ADDERALL) 10 MG tablet Take 10 mg by mouth daily. 01/01/22   [provider]  Cholecalciferol (VITAMIN D PO) Take 10,000 Units by mouth every other day.    [provider]  hydrOXYzine (ATARAX) 10 MG tablet Take 1 tablet (10 mg total) by mouth as needed. 06/11/22   Marcial Pacas, MD  ibuprofen (ADVIL) 800 MG tablet Take 800 mg by mouth every 8 (eight) hours as needed. 09/21/21   [provider]  Multiple Vitamin (MULTIVITAMIN) capsule Take 1 capsule by mouth daily.    [provider]  OnabotulinumtoxinA (BOTOX IM) Inject 200 Units into the muscle every 3 (three) months.    [provider]  ondansetron (ZOFRAN ODT) 4 MG disintegrating tablet Take 1 tablet (4 mg total) by mouth every 8 (eight) hours as needed. 11/20/18   Marcial Pacas, MD  rizatriptan (MAXALT-MLT) 10 MG disintegrating  tablet Take 1 tablet (10 mg total) by mouth as needed. May repeat in 2 hours if needed 03/21/21   Marcial Pacas, MD  SUMAtriptan (IMITREX) 100 MG tablet Take 1 tablet (100 mg total) by mouth every 2 (two) hours as needed for migraine. May repeat in 2 hours if headache persists or recurs. 06/11/22   Marcial Pacas, MD      Allergies    Latex, Eggs or egg-derived products, Acetaminophen, Influenza vaccines, Metoprolol, Venlafaxine, and Verapamil    Review of Systems   Review of Systems  Gastrointestinal:  Positive for abdominal pain.  Genitourinary:        Urinary incontinence  Musculoskeletal:  Positive for back pain.  Neurological:        Saddle anesthesia    Physical Exam Updated Vital Signs BP 92/77   Pulse 90   Temp (!) 97.4 F (36.3 C) (Oral)   Resp 18   SpO2 99%  Physical Exam Vitals and nursing note reviewed.  HENT:     Head: Normocephalic and atraumatic.     Mouth/Throat:     Mouth: Mucous membranes are moist.  Eyes:     General:        Right eye: No discharge.        Left eye: No discharge.     Conjunctiva/sclera: Conjunctivae normal.  Cardiovascular:     Rate and Rhythm: Normal rate and regular rhythm.  Pulses: Normal pulses.     Heart sounds: Normal heart sounds.  Pulmonary:     Effort: Pulmonary effort is normal.     Breath sounds: Normal breath sounds.  Abdominal:     General: Abdomen is flat.     Palpations: Abdomen is soft.     Tenderness: There is abdominal tenderness in the right lower quadrant and suprapubic area. There is right CVA tenderness.  Musculoskeletal:     Lumbar back: Tenderness present.     Comments: Gait impaired d/t pain. Only shuffling feet when asked to walk. Rectal tone intact. Midline lumbar tenderness noted.   Skin:    General: Skin is warm and dry.  Neurological:     General: No focal deficit present.     Comments: Bilateral patellar hyperreflexia.  Sensation intact to lower extremities.  Difficult to assess strength and range of  motion of the lower extremities due to pain.  Psychiatric:        Mood and Affect: Mood normal.     ED Results / Procedures / Treatments   Labs (all labs ordered are listed, but only abnormal results are displayed) Labs Reviewed  CBC WITH DIFFERENTIAL/PLATELET - Abnormal; Notable for the following components:      Result Value   WBC 13.7 (*)    Neutro Abs 11.9 (*)    Abs Immature Granulocytes 0.08 (*)    All other components within normal limits  COMPREHENSIVE METABOLIC PANEL - Abnormal; Notable for the following components:   Glucose, Bld 111 (*)    Alkaline Phosphatase 36 (*)    All other components within normal limits  URINALYSIS, ROUTINE W REFLEX MICROSCOPIC - Abnormal; Notable for the following components:   APPearance CLOUDY (*)    Hgb urine dipstick SMALL (*)    Protein, ur 100 (*)    Nitrite POSITIVE (*)    Leukocytes,Ua LARGE (*)    WBC, UA >50 (*)    Bacteria, UA MANY (*)    All other components within normal limits  LIPASE, BLOOD  PREGNANCY, URINE    EKG None  Radiology CT L-SPINE NO CHARGE  Result Date: 06/15/2022 CLINICAL DATA:  Right-sided low back pain EXAM: CT LUMBAR SPINE WITHOUT CONTRAST TECHNIQUE: Multidetector CT imaging of the lumbar spine was performed without intravenous contrast administration. Multiplanar CT image reconstructions were also generated. RADIATION DOSE REDUCTION: This exam was performed according to the departmental dose-optimization program which includes automated exposure control, adjustment of the mA and/or kV according to patient size and/or use of iterative reconstruction technique. COMPARISON:  CT 02/18/2022, MRI 02/19/2022 FINDINGS: Segmentation: 5 lumbar type vertebrae. Alignment: Normal. Vertebrae: Acute nondisplaced fracture of the left L3 transverse process (series 4, image 53). No additional fractures. No focal pathologic process. Paraspinal and other soft tissues: Prominent urothelial enhancement of the right ureter.  Cholelithiasis. See dedicated same day CT of the abdomen and pelvis for full characterization of the abdominopelvic findings. Disc levels: Intervertebral disc heights are preserved. Facet joints within normal limits. IMPRESSION: 1. Acute nondisplaced fracture of the left L3 transverse process. 2. Prominent urothelial enhancement of the right ureter, which may represent ascending urinary tract infection. 3. Cholelithiasis. See dedicated same day CT of the abdomen and pelvis for full characterization of the abdominopelvic findings. Electronically Signed   By: Davina Poke D.O.   On: 06/15/2022 15:31   CT Thoracic Spine Wo Contrast  Result Date: 06/15/2022 CLINICAL DATA:  Back pain EXAM: CT THORACIC SPINE WITHOUT CONTRAST TECHNIQUE: Multidetector CT images of  the thoracic were obtained using the standard protocol without intravenous contrast. RADIATION DOSE REDUCTION: This exam was performed according to the departmental dose-optimization program which includes automated exposure control, adjustment of the mA and/or kV according to patient size and/or use of iterative reconstruction technique. COMPARISON:  02/17/2022 FINDINGS: Alignment: Mild thoracic dextrocurvature.  No static listhesis. Vertebrae: No acute fracture. Benign sclerotic bone island within the T2 vertebral body which was low in signal on T1 and T2 weighted images on MRI from 02/17/2022. No suspicious lytic or sclerotic bone lesion. Paraspinal and other soft tissues: Negative. Disc levels: Intervertebral disc heights are preserved. Similar degree of mild right-sided facet arthropathy within the upper to midthoracic spine. IMPRESSION: 1. No acute fracture or static listhesis of the thoracic spine. 2. Similar degree of mild right-sided facet arthropathy within the upper to midthoracic spine. Electronically Signed   By: Davina Poke D.O.   On: 06/15/2022 15:25   CT Abdomen Pelvis W Contrast  Result Date: 06/15/2022 CLINICAL DATA:  Right lower  quadrant abdominal pain. EXAM: CT ABDOMEN AND PELVIS WITH CONTRAST TECHNIQUE: Multidetector CT imaging of the abdomen and pelvis was performed using the standard protocol following bolus administration of intravenous contrast. RADIATION DOSE REDUCTION: This exam was performed according to the departmental dose-optimization program which includes automated exposure control, adjustment of the mA and/or kV according to patient size and/or use of iterative reconstruction technique. CONTRAST:  124m OMNIPAQUE IOHEXOL 300 MG/ML  SOLN COMPARISON:  July 28, 2014. FINDINGS: Lower chest: No acute abnormality. Hepatobiliary: Mild cholelithiasis is noted. No biliary dilatation is noted. Stable hepatic cysts. Periportal hypodensity is noted suggesting edema. Pancreas: Unremarkable. No pancreatic ductal dilatation or surrounding inflammatory changes. Spleen: Normal in size without focal abnormality. Adrenals/Urinary Tract: Adrenal glands appear normal. Minimal right hydroureteronephrosis is noted without evidence of obstructing calculus; some wall enhancement of the ureter is noted and ascending urinary tract infection cannot be excluded. Urinary bladder is unremarkable. Stomach/Bowel: Stomach is unremarkable. Status post appendectomy. There is no evidence of bowel obstruction or inflammation. Vascular/Lymphatic: No significant vascular findings are present. No enlarged abdominal or pelvic lymph nodes. Reproductive: Uterus and bilateral adnexa are unremarkable. Other: No abdominal wall hernia or abnormality. No abdominopelvic ascites. Musculoskeletal: No acute or significant osseous findings. IMPRESSION: Periportal edema is noted in the liver of uncertain etiology. Mild cholelithiasis is noted. Minimal right hydroureteronephrosis is noted without evidence of obstructing calculus. Wall enhancement of the ureter is noted and ascending urinary tract infection cannot be excluded. Electronically Signed   By: JMarijo ConceptionM.D.    On: 06/15/2022 15:25    Procedures Procedures    Medications Ordered in ED Medications  cefTRIAXone (ROCEPHIN) 1 g in sodium chloride 0.9 % 100 mL IVPB (0 g Intravenous Stopped 06/15/22 1739)  morphine (PF) 4 MG/ML injection 4 mg (4 mg Intravenous Given 06/15/22 1258)  ondansetron (ZOFRAN) injection 4 mg (4 mg Intravenous Given 06/15/22 1257)  sodium chloride 0.9 % bolus 1,000 mL (0 mLs Intravenous Stopped 06/15/22 1355)  iohexol (OMNIPAQUE) 300 MG/ML solution 100 mL (100 mLs Intravenous Contrast Given 06/15/22 1435)  oxyCODONE (Oxy IR/ROXICODONE) immediate release tablet 10 mg (10 mg Oral Given 06/15/22 1619)  ketorolac (TORADOL) 15 MG/ML injection 15 mg (15 mg Intravenous Given 06/15/22 1633)  ondansetron (ZOFRAN-ODT) disintegrating tablet 4 mg (4 mg Oral Given 06/15/22 1737)    ED Course/ Medical Decision Making/ A&P Clinical Course as of 06/15/22 1816  Fri Jun 15, 2022  1402 Urinalysis, Routine w reflex microscopic Urine, Clean  Catch(!) [JR]  [id number] This is a 46 year old female presenting with fever, flank pain.  She has nausea but denies vomiting.  She is otherwise ambulatory tolerating p.o. intake.  Notably, she has had dysuria with rapid progression.  Objective evaluation concerning for developing pyelonephritis.  Patient being treated with ceftriaxone in the emergency department, pain control in the emergency department with plan for [CC]    Clinical Course User Index [CC] Tretha Sciara, MD [JR] Harriet Pho, PA-C                           Medical Decision Making Amount and/or Complexity of Data Reviewed Labs: ordered. Radiology: ordered.  Risk Prescription drug management.   This patient presents to the ED for concern of abdominal pain and back pain, this involves a number of treatment options, and is a complaint that carries with it a high risk of complications and morbidity.  The differential diagnosis includes trauma, pyelonephritis, PID, and appendicitis.    Co  morbidities: Discussed in HPI   EMR reviewed including pt PMHx, past surgical history and past visits to ER.   See HPI for more details   Lab Tests:   I ordered and independently interpreted labs. Labs notable for bacturia, pyuria, hematuria   Imaging Studies:  Abnormal findings. I personally reviewed all imaging studies. Imaging notable for L3 transverse process fracture, and minimal right hydroureteronephrosis concerning for possible ascending urinary tract infection     Cardiac Monitoring:  The patient was maintained on a cardiac monitor.  I personally viewed and interpreted the cardiac monitored which showed an underlying rhythm of: NSR EKG non-ischemic   Medicines ordered:  I ordered medication including Zofran for nausea, morphine, oxycodone, Toradol for pain, normal saline bolus for fluid resuscitation Reevaluation of the patient after these medicines showed that the patient stayed the same I have reviewed the patients home medicines and have made adjustments as needed     Consults/Attending Physician   I requested consultation with Dr. Pietro Cassis,  and discussed lab and imaging findings as well as pertinent plan - they recommend: Admission to the hospital for pyelonephritis, pain control and concern for cauda equina syndrome   Reevaluation:  After the interventions noted above I re-evaluated patient and found that they have :stayed the same    Problem List / ED Course: She presented for abdominal pain and back pain.  Exam revealed right-sided CVA tenderness, midline lumbar tenderness and suprapubic tenderness.  It was difficult to assess gait due to pain.  Urinalysis revealed bacteriuria pyuria and hematuria which in the setting of CVA tenderness was concerning for possible pyelonephritis.  CT revealed concern for ascending UTI.  With these findings it was indicated to treat her for active pyelonephritis.  Started Rocephin and plan to treat outpatient with  cefdinir.  CT of the lumbar spine revealed L3 transverse process fracture.  She also endorsed urinary incontinence and saddle anesthesia.  This was concerning for possible cauda equina syndrome.  Request admission for pyelonephritis, pain control, and concern for cauda equina syndrome.  Dr. Pietro Cassis was amenable to this plan and accepted her for admission.  Dispostion:  After consideration of the diagnostic results and the patients response to treatment, I feel that the patent would benefit from admission to the hospital for ongoing management management of pyelonephritis, pain control, rule out cauda equina syndrome.         Final Clinical Impression(s) / ED Diagnoses Final diagnoses:  Lumbar  transverse process fracture, closed, initial encounter [facility])  Pyelonephritis    Rx / DC Orders ED Discharge Orders          Ordered    cefdinir (OMNICEF) 300 MG capsule  2 times daily,   Status:  Discontinued        06/15/22 1550              Harriet Pho, PA-C 06/15/22 1816    Tretha Sciara, MD 06/18/22 [phone]

## 2022-06-15 NOTE — Progress Notes (Signed)
Sent for pt for MRI @ 2305. Per RN, pt refused to come to MRI tonight and would like to have her exam done tomorrow instead. Informed RN that no time is known for MRI exam to be done tomorrow but that day shift would be in contact with RN to try to get pt down for her exam. Ordering MD made aware of situation as well.

## 2022-06-15 NOTE — Progress Notes (Signed)
Orthopedic Tech Progress Note Patient Details:  [name] [name] [birth year] [id number] TLSO Brace has been ordered from [facility]  Patient ID: [name] [name], female   DOB: 08-26-1976, 46 y.o.   MRN: [id number]  [name] [name] 06/15/2022, 5:32 PM

## 2022-06-15 NOTE — Consult Note (Signed)
Reason for Consult:Low back pain Referring Physician: Dahal MD  [name] [name] is an 46 y.o. female.  HPI: Patient admitted today with LBP and abdominal pain with a diagnosis of Pyelonephritis but recent history of fall from a tree within the last week. She had gone earlier today to the [facility] urgent care complaining of LBP; however,while in the lobby she began to complain of SOB and dizziness and was transported by EMS to the [facility] ED without having been seen in our office by any provider.  Patient reports to me a long standing history of low back pain and several previous injuries. She has a diagnosis of Lumbar degenerative disc disease with disc bulges at T12-L1 and L5-S1.  These have been seen on serial MRIs and are stable from 2022 to 2023.  She has been treated for her back pain by Dr Rodell Perna at [facility]  She has also been seen and managed by Dr Alger Simons with [facility].  She was recently doing physical therapy which was helping. [facility] was consulted for LBP and Left L3 transverse process fracture primarily due to the fact that she was transported to the ED from our waiting room. She is now feeling some better with the pain medication and the TLSO brace ordered by the EDP.  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 Silvano Rusk   ESOPHAGOGASTRODUODENOSCOPY  09/29/2004   Dr. Kennedy Bucker   LAPAROSCOPIC APPENDECTOMY N/A 07/28/2014   Procedure: APPENDECTOMY LAPAROSCOPIC;  Surgeon: Autumn Messing III, MD;  Location: [facility];  Service: General;  Laterality: N/A;   TRANSPHENOIDAL / TRANSNASAL HYPOPHYSECTOMY / RESECTION PITUITARY TUMOR  1999   resection of benign pituitary tumor, [place]  2008    Family History  Problem Relation Age of Onset   Heart disease Mother    Diabetes Mother    Heart disease Father    Prostate cancer Father    Alzheimer's disease Father    Colon cancer Maternal Grandmother    Heart disease Other    Prostate cancer Other    Diabetes Other    Autism Child        2 of 3 daughters have high functioning Autism   Breast cancer Paternal Aunt     Social History:  reports that she has never smoked. She has never used smokeless tobacco. She reports current alcohol use of about 1.0 standard drink of alcohol per week. She reports that she does not  use drugs.  Allergies:  Allergies  Allergen Reactions   Latex Rash   Eggs Or Egg-Derived Products Nausea And Vomiting    She cannot have flu vaccination.   Acetaminophen Other (See Comments)    Itchy, hives, rash, stomach pain, nausea   Influenza Vaccines    Metoprolol Other (See Comments)    dizziness   Venlafaxine     Reports one time seizure on this medication (occurred 20+ years ago)   Verapamil Nausea Only    Headache, dizziness    Medications: I have reviewed the patient's current medications.  Results for orders placed or performed during the hospital encounter of 06/15/22 (from the past 48 hour(s))  CBC with Differential     Status: Abnormal   Collection Time: 06/15/22 12:57 PM  Result Value Ref  Range   WBC 13.7 (H) 4.0 - 10.5 K/uL   RBC 4.30 3.87 - 5.11 MIL/uL   Hemoglobin 12.3 12.0 - 15.0 g/dL   HCT 39.1 36.0 - 46.0 %   MCV 90.9 80.0 - 100.0 fL   MCH 28.6 26.0 - 34.0 pg   MCHC 31.5 30.0 - 36.0 g/dL   RDW 13.9 11.5 - 15.5 %   Platelets 273 150 - 400 K/uL   nRBC 0.0 0.0 - 0.2 %   Neutrophils Relative % 87 %   Neutro Abs 11.9 (H) 1.7 - 7.7 K/uL   Lymphocytes Relative 5 %   Lymphs Abs 0.7 0.7 - 4.0 K/uL   Monocytes Relative 7 %   Monocytes Absolute 1.0 0.1 - 1.0 K/uL   Eosinophils Relative 0 %   Eosinophils Absolute 0.0 0.0 - 0.5 K/uL   Basophils Relative 0 %   Basophils Absolute 0.0 0.0 - 0.1 K/uL   Immature Granulocytes 1 %   Abs Immature Granulocytes 0.08 (H) 0.00 - 0.07 K/uL    Comment: Performed at [facility], [place] [address], [place], [place] [address]  Comprehensive metabolic panel     Status: Abnormal   Collection Time: 06/15/22 12:57 PM  Result Value Ref Range   Sodium 139 135 - 145 mmol/L   Potassium 3.6 3.5 - 5.1 mmol/L   Chloride 105 98 - 111 mmol/L   CO2 26 22 - 32 mmol/L   Glucose, Bld 111 (H) 70 - 99 mg/dL    Comment: Glucose reference range applies only to samples taken after fasting for at least 8 hours.   BUN 12 6 - 20 mg/dL   Creatinine, Ser 0.70 0.44 - 1.00 mg/dL   Calcium 9.2 8.9 - 10.3 mg/dL   Total Protein 7.2 6.5 - 8.1 g/dL   Albumin 4.3 3.5 - 5.0 g/dL   AST 27 15 - 41 U/L   ALT 23 0 - 44 U/L   Alkaline Phosphatase 36 (L) 38 - 126 U/L   Total Bilirubin 0.6 0.3 - 1.2 mg/dL   GFR, Estimated >60 >60 mL/min    Comment: (NOTE) Calculated using the CKD-EPI Creatinine Equation (2021)    Anion gap 8 5 - 15    Comment: Performed at [facility], [place] [address] [place], [place] [address]  Lipase, blood     Status: None   Collection Time: 06/15/22 12:57 PM  Result Value Ref Range   Lipase 24 11 - 51 U/L    Comment: Performed at [facility], [place] [address] [place], [place] [address]   Urinalysis, Routine w reflex microscopic Urine, Clean Catch     Status: Abnormal  Collection Time: 06/15/22  1:07 PM  Result Value Ref Range   Color, Urine YELLOW YELLOW   APPearance CLOUDY (A) CLEAR   Specific Gravity, Urine 1.015 1.005 - 1.030   pH 6.0 5.0 - 8.0   Glucose, UA NEGATIVE NEGATIVE mg/dL   Hgb urine dipstick SMALL (A) NEGATIVE   Bilirubin Urine NEGATIVE NEGATIVE   Ketones, ur NEGATIVE NEGATIVE mg/dL   Protein, ur 100 (A) NEGATIVE mg/dL   Nitrite POSITIVE (A) NEGATIVE   Leukocytes,Ua LARGE (A) NEGATIVE   RBC / HPF 0-5 0 - 5 RBC/hpf   WBC, UA >50 (H) 0 - 5 WBC/hpf   Bacteria, UA MANY (A) NONE SEEN   Squamous Epithelial / LPF 0-5 0 - 5   WBC Clumps PRESENT    Mucus PRESENT     Comment: Performed at [facility], [place] [address] [place], [place] [address]  Pregnancy, urine     Status: None   Collection Time: 06/15/22  1:07 PM  Result Value Ref Range   Preg Test, Ur NEGATIVE NEGATIVE    Comment:        THE SENSITIVITY OF THIS METHODOLOGY IS >20 mIU/mL. Performed at [facility], [place] [address] [place], [place] [address]     CT L-SPINE NO CHARGE  Result Date: 06/15/2022 CLINICAL DATA:  Right-sided low back pain EXAM: CT LUMBAR SPINE WITHOUT CONTRAST TECHNIQUE: Multidetector CT imaging of the lumbar spine was performed without intravenous contrast administration. Multiplanar CT image reconstructions were also generated. RADIATION DOSE REDUCTION: This exam was performed according to the departmental dose-optimization program which includes automated exposure control, adjustment of the mA and/or kV according to patient size and/or use of iterative reconstruction technique. COMPARISON:  CT 02/18/2022, MRI 02/19/2022 FINDINGS: Segmentation: 5 lumbar type vertebrae. Alignment: Normal. Vertebrae: Acute nondisplaced fracture of the left L3 transverse process (series 4, image 53). No additional fractures. No focal pathologic process.  Paraspinal and other soft tissues: Prominent urothelial enhancement of the right ureter. Cholelithiasis. See dedicated same day CT of the abdomen and pelvis for full characterization of the abdominopelvic findings. Disc levels: Intervertebral disc heights are preserved. Facet joints within normal limits. IMPRESSION: 1. Acute nondisplaced fracture of the left L3 transverse process. 2. Prominent urothelial enhancement of the right ureter, which may represent ascending urinary tract infection. 3. Cholelithiasis. See dedicated same day CT of the abdomen and pelvis for full characterization of the abdominopelvic findings. Electronically Signed   By: Davina Poke D.O.   On: 06/15/2022 15:31   CT Thoracic Spine Wo Contrast  Result Date: 06/15/2022 CLINICAL DATA:  Back pain EXAM: CT THORACIC SPINE WITHOUT CONTRAST TECHNIQUE: Multidetector CT images of the thoracic were obtained using the standard protocol without intravenous contrast. RADIATION DOSE REDUCTION: This exam was performed according to the departmental dose-optimization program which includes automated exposure control, adjustment of the mA and/or kV according to patient size and/or use of iterative reconstruction technique. COMPARISON:  02/17/2022 FINDINGS: Alignment: Mild thoracic dextrocurvature.  No static listhesis. Vertebrae: No acute fracture. Benign sclerotic bone island within the T2 vertebral body which was low in signal on T1 and T2 weighted images on MRI from 02/17/2022. No suspicious lytic or sclerotic bone lesion. Paraspinal and other soft tissues: Negative. Disc levels: Intervertebral disc heights are preserved. Similar degree of mild right-sided facet arthropathy within the upper to midthoracic spine. IMPRESSION: 1. No acute fracture or static listhesis of the thoracic spine. 2. Similar degree of mild right-sided facet arthropathy within the upper to  midthoracic spine. Electronically Signed   By: Davina Poke D.O.   On: 06/15/2022  15:25   CT Abdomen Pelvis W Contrast  Result Date: 06/15/2022 CLINICAL DATA:  Right lower quadrant abdominal pain. EXAM: CT ABDOMEN AND PELVIS WITH CONTRAST TECHNIQUE: Multidetector CT imaging of the abdomen and pelvis was performed using the standard protocol following bolus administration of intravenous contrast. RADIATION DOSE REDUCTION: This exam was performed according to the departmental dose-optimization program which includes automated exposure control, adjustment of the mA and/or kV according to patient size and/or use of iterative reconstruction technique. CONTRAST:  126m OMNIPAQUE IOHEXOL 300 MG/ML  SOLN COMPARISON:  July 28, 2014. FINDINGS: Lower chest: No acute abnormality. Hepatobiliary: Mild cholelithiasis is noted. No biliary dilatation is noted. Stable hepatic cysts. Periportal hypodensity is noted suggesting edema. Pancreas: Unremarkable. No pancreatic ductal dilatation or surrounding inflammatory changes. Spleen: Normal in size without focal abnormality. Adrenals/Urinary Tract: Adrenal glands appear normal. Minimal right hydroureteronephrosis is noted without evidence of obstructing calculus; some wall enhancement of the ureter is noted and ascending urinary tract infection cannot be excluded. Urinary bladder is unremarkable. Stomach/Bowel: Stomach is unremarkable. Status post appendectomy. There is no evidence of bowel obstruction or inflammation. Vascular/Lymphatic: No significant vascular findings are present. No enlarged abdominal or pelvic lymph nodes. Reproductive: Uterus and bilateral adnexa are unremarkable. Other: No abdominal wall hernia or abnormality. No abdominopelvic ascites. Musculoskeletal: No acute or significant osseous findings. IMPRESSION: Periportal edema is noted in the liver of uncertain etiology. Mild cholelithiasis is noted. Minimal right hydroureteronephrosis is noted without evidence of obstructing calculus. Wall enhancement of the ureter is noted and ascending  urinary tract infection cannot be excluded. Electronically Signed   By: JMarijo ConceptionM.D.   On: 06/15/2022 15:25    Review of Systems Blood pressure 99/64, pulse 61, temperature 98.2 F (36.8 C), temperature source Oral, resp. rate 15, height '5\' 6"'$  (1.676 m), weight 63.5 kg, SpO2 100 %, unknown if currently breastfeeding. Physical Exam Patient is awake and alert and oriented in no apparent distress.  She is very mobile about her bed easily able to sit up, turn to her side and lay back flat on her back. She has normal AROM of the C spine. Bilateral shoulders, elbows, and wrists without swelling and nontender. Grade 5 motor strength bilateral UEs. T and L spine diffusely tender in the paraspinous muscles but no deformity stepoff or focal bony tenderness. Minimal tenderness over the left PSIS and left SI joint Both legs with pain free AROM and PROM of the hips, knees and ankles.  Grade 5 motor strength bilateral TA, GS, EHL and knee extensors and flexors.  Sensation grossly intact, skin temp normal with normal pulses and normal reflexes distally at the knees ankles and normal plantar reflexes.   Assessment/Plan: Stable L3 non-displaced left transverse process fracture after fall from tree. No signs of any neurologic deficits. Agree with symptomatic care for this stable spine injury in the setting of a patient with long-standing back issues. Activity as tolerated and pain management per primary team.  She would like to continue physical therapy for her back which I think is a good idea.  TLSO may be worn if it helps with her pain. (Will help better when she is up moving around) Follow up with Dr YLorin Mercyand Dr SNaaman Plummerand other physicians who have been managing her back issues previously. Thanks for the consult. Available by phone for any additional questions or concerns during this admission.    SRemo Lipps  Orlena Sheldon 06/15/2022, 8:56 PM  [facility] [phone] [phone], cell
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
## 2022-06-15 NOTE — H&P (Signed)
[facility] History and Physical  [name] [name] XBJ:[id number] DOB: 1975-11-16 DOA: 06/15/2022 PCP: Patient, No Pcp Per  Admitted from: Home Chief Complaint: Intractable back pain, abdominal pain  History of Present Illness: [name] [name] is a 46 y.o. female with PMH significant for anxiety/depression, arthritis, history of disc bulging, fibromyalgia, IBS, migraine, postpartum left leg DVT. Patient was sent to ED from [facility] office today because of severity of her pain. Patient has chronic pain due to fibromyalgia, migraine and has been on and off pain medicines. Few months ago, she had an accident at the trampoline after which she underwent extensive evaluation.  No intracranial injury, cervical injury or any fracture was found.  However she is having more days with general pain and migraine since then. 5 days ago, she was climbing a tree with her kids and fell on her back.  The pain was more severe at first and she tried heat pad and ice pack but it progressively worsened to a point where she is now having difficulty standing and walking.  From yesterday, she started having burning urination as well. Today she presented at [facility].  Because of the level of severity of her pain, she was sent to ED.    In the ED, she was afebrile, hemodynamically stable with blood pressure in low normal range which is her baseline Labs with WC count 13.7, hemoglobin, platelet normal, renal function normal Urine pregnancy test negative Urinalysis with cloudy yellow urine with large amount of leukocytes, positive nitrite and many bacteria CT abdomen pelvis showed minimal right hydroureteronephrosis, wall enhancement of the ureter suspicious for UTI without evidence of obstructing calculus.  CT scan of lumbar spine showed an acute nondisplaced fracture of the left L3 transverse process. CT scan of thoracic spine did not any acute findings. Patient was started on IV fluid, IV antibiotics given for  pyelonephritis. ED physician paged [facility] to discuss lumbar transverse process fracture Hospitalist service called for inpatient admission and management  At the time of my evaluation, patient was stooped forward to avoid pain.  She was having obvious difficulty during my conversation.   Review of Systems:  All systems were reviewed and were negative unless otherwise mentioned in the HPI   Past medical history: Past Medical History:  Diagnosis Date   Adenomatous colon polyp 2008   Anxiety 1999/2000   Arthritis    "jaw joints; neck" (30/05/6577)   Complication of anesthesia    "didn't wake up well/remembered stuff from during OR when I had pituitary surgery"   Constipation    Depression    hx   DVT (deep vein thrombosis) in pregnancy 09/2013   "LLE; postpartum"   Fibromyalgia    GERD (gastroesophageal reflux disease)    H/O hiatal hernia    Hematochezia    IBS (irritable bowel syndrome)    Internal hemorrhoids    Migraines    "couple times/wk" (07/28/2014)   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 Silvano Rusk   ESOPHAGOGASTRODUODENOSCOPY  09/29/2004   Dr. Kennedy Bucker   LAPAROSCOPIC APPENDECTOMY N/A 07/28/2014   Procedure: APPENDECTOMY LAPAROSCOPIC;  Surgeon: Autumn Messing III, MD;  Location: [place];  Service: General;  Laterality: N/A;   TRANSPHENOIDAL / TRANSNASAL HYPOPHYSECTOMY / RESECTION PITUITARY TUMOR  1999   resection of benign pituitary tumor, [place]  2008    Social History:  reports that she has never smoked. She has never used smokeless tobacco. She reports current alcohol use of about 1.0 standard drink of alcohol per  week. She reports that she does not use drugs.  Allergies:  Allergies  Allergen Reactions   Latex Rash   Eggs Or Egg-Derived Products Nausea And Vomiting    She cannot have flu vaccination.   Acetaminophen Other (See Comments)    Itchy, hives, rash, stomach pain, nausea   Influenza Vaccines    Metoprolol Other (See Comments)    dizziness   Venlafaxine     Reports one time seizure on this medication (occurred 20+ years ago)   Verapamil Nausea Only    Headache, dizziness   Latex, Eggs or egg-derived products, Acetaminophen, Influenza vaccines, Metoprolol, Venlafaxine, and Verapamil   Family history:  Family History  Problem Relation Age of Onset   Heart disease Mother    Diabetes Mother    Heart disease Father    Prostate cancer Father    Alzheimer's disease Father    Colon cancer Maternal Grandmother    Heart disease Other    Prostate cancer Other    Diabetes Other    Autism Child        2 of 3 daughters have high functioning Autism   Breast cancer Paternal Aunt      Home Meds: Prior to Admission medications   Medication Sig Start Date End Date Taking? Authorizing Provider  cefdinir (OMNICEF) 300 MG capsule Take 1 capsule (300 mg total) by mouth 2 (two) times daily for 14 days. 06/15/22 06/29/22 Yes Harriet Pho, PA-C  amphetamine-dextroamphetamine (ADDERALL) 10 MG tablet Take 10 mg by mouth daily. 01/01/22   [provider]  Cholecalciferol (VITAMIN D PO) Take 10,000 Units by mouth every other day.    [provider]  hydrOXYzine (ATARAX) 10 MG tablet Take 1 tablet (10 mg total) by mouth as needed. 06/11/22   Marcial Pacas, MD  ibuprofen (ADVIL) 800 MG tablet Take 800 mg by mouth every 8 (eight) hours as needed. 09/21/21   [provider]  Multiple Vitamin (MULTIVITAMIN) capsule Take 1 capsule by mouth daily.    [provider]  OnabotulinumtoxinA (BOTOX IM) Inject 200 Units into the muscle every 3 (three) months.    [provider]  ondansetron (ZOFRAN ODT) 4 MG disintegrating tablet Take 1 tablet (4 mg total) by mouth every 8 (eight) hours as needed. 11/20/18   Marcial Pacas, MD  rizatriptan (MAXALT-MLT) 10 MG disintegrating tablet Take 1 tablet (10 mg total) by mouth as needed. May repeat in 2 hours if needed 03/21/21   Marcial Pacas, MD  SUMAtriptan (IMITREX) 100 MG tablet Take 1 tablet (100 mg total) by mouth every 2 (two) hours as needed for migraine. May repeat in 2 hours if headache persists or recurs. 06/11/22   Marcial Pacas, MD    Physical Exam: Vitals:   06/15/22 1600 06/15/22 1615 06/15/22 1630 06/15/22 1645  BP: 115/78 118/76 107/73 92/77  Pulse: 73 62 71 90  Resp:      Temp:    (!) 97.4 F (36.3 C)  TempSrc:    Oral  SpO2: 99% 100% 100% 99%   Wt Readings from Last 3 Encounters:  06/11/22 65.8 kg  03/15/22 65.8 kg  02/17/22 65.8 kg   There is no height or weight on file to calculate BMI.  General exam: [place] Caucasian female.  In acute distress because of severe pain Skin: No rashes, lesions or ulcers. HEENT: Atraumatic, normocephalic, no obvious bleeding Lungs: Clear to auscultation bilaterally CVS: Regular rate and rhythm, no murmur GI/Abd soft, nontender, nondistended, bowel sound present CNS: Alert, awake, oriented x3.  Able to move left lower extremity but unable to do full exam because of significant pain and spasm.  Rectal tone present per ED physician Psychiatry: Mood appropriate Extremities: No pedal edema, no calf tenderness     Consult Orders  (From admission, onward)           Start     Ordered   06/15/22 1807  PT eval and treat  Routine        06/15/22 1806   06/15/22 1750  Inpatient consult to spiritual care  Once       Comments: Pt has advance directives and states she would like chaplain to help her make new ones. She wants to change her previous ones. Please provide assistance.  Provider:  (Not yet assigned)  Question:  Reason for Consult  Answer:  Create or update advance  directive   06/15/22 1751   06/15/22 1719  Consult to hospitalist  Number is [phone]  Once       Comments: Number is [phone]  Provider:  (Not yet assigned)  Question Answer Comment  Place call to: [facility]   Reason for Consult Admit      06/15/22 1722            Labs on Admission:   CBC: Recent Labs  Lab 06/15/22 1257  WBC 13.7*  NEUTROABS 11.9*  HGB 12.3  HCT 39.1  MCV 90.9  PLT 962    Basic Metabolic Panel: Recent Labs  Lab 06/15/22 1257  NA 139  K 3.6  CL 105  CO2 26  GLUCOSE 111*  BUN 12  CREATININE 0.70  CALCIUM 9.2    Liver Function Tests: Recent Labs  Lab 06/15/22 1257  AST 27  ALT 23  ALKPHOS 36*  BILITOT 0.6  PROT 7.2  ALBUMIN 4.3   Recent Labs  Lab 06/15/22 1257  LIPASE 24   No results for input(s): "AMMONIA" in the last 168 hours.  Cardiac Enzymes: No results for input(s): "CKTOTAL", "CKMB", "CKMBINDEX", "TROPONINI" in the last 168 hours.  BNP (last 3 results) No results for input(s): "BNP" in the last 8760 hours.  ProBNP (last 3 results) No results for input(s): "PROBNP" in the last 8760 hours.  CBG: No results for input(s): "GLUCAP" in the last 168 hours.  Lipase     Component Value Date/Time   LIPASE 24 06/15/2022 1257     Urinalysis    Component Value Date/Time   COLORURINE YELLOW 06/15/2022 1307   APPEARANCEUR CLOUDY (A) 06/15/2022 1307   LABSPEC 1.015 06/15/2022 1307   PHURINE 6.0 06/15/2022 1307   GLUCOSEU NEGATIVE 06/15/2022 1307   HGBUR SMALL (A) 06/15/2022 1307   BILIRUBINUR NEGATIVE 06/15/2022 1307   KETONESUR NEGATIVE 06/15/2022 1307   PROTEINUR 100 (A) 06/15/2022 1307   UROBILINOGEN 0.2 07/28/2014 0746   NITRITE POSITIVE (A) 06/15/2022 1307   LEUKOCYTESUR LARGE (A) 06/15/2022 1307     Drugs of Abuse     Component Value Date/Time   LABOPIA PPS 06/02/2015 1334   COCAINSCRNUR NEG 06/02/2015 1334   LABBENZ NEG  06/02/2015 1334   AMPHETMU NEG 06/02/2015 1334   THCU NEG  06/02/2015 1334   LABBARB NEG 06/02/2015 1334      Radiological Exams on Admission: CT L-SPINE NO CHARGE  Result Date: 06/15/2022 CLINICAL DATA:  Right-sided low back pain EXAM: CT LUMBAR SPINE WITHOUT CONTRAST TECHNIQUE: Multidetector CT imaging of the lumbar spine was performed without intravenous contrast administration. Multiplanar CT image reconstructions were also generated. RADIATION DOSE REDUCTION: This exam was performed according to the departmental dose-optimization program which includes automated exposure control, adjustment of the mA and/or kV according to patient size and/or use of iterative reconstruction technique. COMPARISON:  CT 02/18/2022, MRI 02/19/2022 FINDINGS: Segmentation: 5 lumbar type vertebrae. Alignment: Normal. Vertebrae: Acute nondisplaced fracture of the left L3 transverse process (series 4, image 53). No additional fractures. No focal pathologic process. Paraspinal and other soft tissues: Prominent urothelial enhancement of the right ureter. Cholelithiasis. See dedicated same day CT of the abdomen and pelvis for full characterization of the abdominopelvic findings. Disc levels: Intervertebral disc heights are preserved. Facet joints within normal limits. IMPRESSION: 1. Acute nondisplaced fracture of the left L3 transverse process. 2. Prominent urothelial enhancement of the right ureter, which may represent ascending urinary tract infection. 3. Cholelithiasis. See dedicated same day CT of the abdomen and pelvis for full characterization of the abdominopelvic findings. Electronically Signed   By: Davina Poke D.O.   On: 06/15/2022 15:31   CT Thoracic Spine Wo Contrast  Result Date: 06/15/2022 CLINICAL DATA:  Back pain EXAM: CT THORACIC SPINE WITHOUT CONTRAST TECHNIQUE: Multidetector CT images of the thoracic were obtained using the standard protocol without intravenous contrast. RADIATION DOSE REDUCTION: This exam was performed according to the departmental  dose-optimization program which includes automated exposure control, adjustment of the mA and/or kV according to patient size and/or use of iterative reconstruction technique. COMPARISON:  02/17/2022 FINDINGS: Alignment: Mild thoracic dextrocurvature.  No static listhesis. Vertebrae: No acute fracture. Benign sclerotic bone island within the T2 vertebral body which was low in signal on T1 and T2 weighted images on MRI from 02/17/2022. No suspicious lytic or sclerotic bone lesion. Paraspinal and other soft tissues: Negative. Disc levels: Intervertebral disc heights are preserved. Similar degree of mild right-sided facet arthropathy within the upper to midthoracic spine. IMPRESSION: 1. No acute fracture or static listhesis of the thoracic spine. 2. Similar degree of mild right-sided facet arthropathy within the upper to midthoracic spine. Electronically Signed   By: Davina Poke D.O.   On: 06/15/2022 15:25   CT Abdomen Pelvis W Contrast  Result Date: 06/15/2022 CLINICAL DATA:  Right lower quadrant abdominal pain. EXAM: CT ABDOMEN AND PELVIS WITH CONTRAST TECHNIQUE: Multidetector CT imaging of the abdomen and pelvis was performed using the standard protocol following bolus administration of intravenous contrast. RADIATION DOSE REDUCTION: This exam was performed according to the departmental dose-optimization program which includes automated exposure control, adjustment of the mA and/or kV according to patient size and/or use of iterative reconstruction technique. CONTRAST:  16m OMNIPAQUE IOHEXOL 300 MG/ML  SOLN COMPARISON:  July 28, 2014. FINDINGS: Lower chest: No acute abnormality. Hepatobiliary: Mild cholelithiasis is noted. No biliary dilatation is noted. Stable hepatic cysts. Periportal hypodensity is noted suggesting edema. Pancreas: Unremarkable. No pancreatic ductal dilatation or surrounding inflammatory changes. Spleen: Normal in size without focal abnormality. Adrenals/Urinary Tract: Adrenal glands  appear normal. Minimal right hydroureteronephrosis is noted without evidence of obstructing calculus; some wall enhancement of the ureter is noted and ascending urinary tract infection cannot be excluded. Urinary bladder  is unremarkable. Stomach/Bowel: Stomach is unremarkable. Status post appendectomy. There is no evidence of bowel obstruction or inflammation. Vascular/Lymphatic: No significant vascular findings are present. No enlarged abdominal or pelvic lymph nodes. Reproductive: Uterus and bilateral adnexa are unremarkable. Other: No abdominal wall hernia or abnormality. No abdominopelvic ascites. Musculoskeletal: No acute or significant osseous findings. IMPRESSION: Periportal edema is noted in the liver of uncertain etiology. Mild cholelithiasis is noted. Minimal right hydroureteronephrosis is noted without evidence of obstructing calculus. Wall enhancement of the ureter is noted and ascending urinary tract infection cannot be excluded. Electronically Signed   By: Marijo Conception M.D.   On: 06/15/2022 15:25     ------------------------------------------------------------------------------------------------------ Assessment/Plan: Principal Problem:   Pyelonephritis of right kidney  Intractable right lower back and abdominal pain Due to combination of acute right pyelonephritis as well as L3 transverse process fracture See below for management of individual issues For pain control, we will put her on IV Dilaudid as needed and oral oxycodone as needed.  Apparently allergic to Tylenol.  Acute right pyelonephritis Urinalysis with cloudy yellow urine with large amount of leukocytes, positive nitrite and many bacteria CT imaging showed minimal right hydroureteronephrosis, wall enhancement of the ureter suspicious for UTI without evidence of obstructing calculus.  Urine culture sent. Trend temperature and WBC count. Recent Labs  Lab 06/15/22 1257  WBC 13.7*   Acute nondisplaced fracture of the  left L3 transverse process No evidence of vertebral fracture in thoracic or lumbar level. Per ED physician, rectal tone was present on exam. Patient [facility] at [facility] prior to ED visit.  TLSO brace was ordered. [facility] to follow-up.  Chronic pain, fibromyalgia, history of disc bulging On intermittent chronic pain meds at home  History of migraine Follows with [facility] neurology. 8/21, last seen by neurology Dr. Krista Blue at [facility] neurolog[facility].  She received Botox injection every 3 months.  Also another oral meds.  Resumed.  History of postpartum left leg DVT Patient reports he had DVT in 2014 after her delivery.  She was shortly on Lovenox and after that on oral pill for few months.  Has not had recurrence since then.  Mobility -PT eval ordered  Goals of care - -  Code Status: Prior full code  Diet:  Diet Order     None      DVT prophylaxis: Lovenox subcu    Antimicrobials: IV Rocephin Fluid: Normal saline Consultants: [facility] Family Communication: None at bedside Dispo: The patient is from: Home              Anticipated d/c is to: Home after adequate pain control  ------------------------------------------------------------------------------------- Severity of Illness: The appropriate patient status for this patient is OBSERVATION. Observation status is judged to be reasonable and necessary in order to provide the required intensity of service to ensure the patient's safety. The patient's presenting symptoms, physical exam findings, and initial radiographic and laboratory data in the context of their medical condition is felt to place them at decreased risk for further clinical deterioration. Furthermore, it is anticipated that the patient will be medically stable for discharge from the hospital within 2 midnights of admission.    Signed, Terrilee Croak, MD [facility] 06/15/2022

## 2022-06-15 NOTE — Progress Notes (Signed)
Order put in for [facility]. Also contacted social worker Narda Amber that patient needs to be seen. She states they will see pt tomorrow and address her concerns.Pt also verbalized that she is having trouble affording her medications. Notified pt's emt to tell pt that social worker will see her in the morning. I also explained to pt earlier that if she decides she needs to have it sit up in patient directory that no information can be given as to her being here at the hospital to please let staff know and registration can come back to her room and change her patient status. Pt verbalizes understanding.  Doroteo Bradford BSN, RN-BC Throughput Nurse 06/15/2022 6:21 PM

## 2022-06-16 ENCOUNTER — Observation Stay (HOSPITAL_COMMUNITY): Payer: Medicare Other

## 2022-06-16 DIAGNOSIS — Z91040 Latex allergy status: Secondary | ICD-10-CM | POA: Diagnosis not present

## 2022-06-16 DIAGNOSIS — G8929 Other chronic pain: Secondary | ICD-10-CM | POA: Diagnosis present

## 2022-06-16 DIAGNOSIS — Z886 Allergy status to analgesic agent status: Secondary | ICD-10-CM | POA: Diagnosis not present

## 2022-06-16 DIAGNOSIS — Z887 Allergy status to serum and vaccine status: Secondary | ICD-10-CM | POA: Diagnosis not present

## 2022-06-16 DIAGNOSIS — J302 Other seasonal allergic rhinitis: Secondary | ICD-10-CM | POA: Diagnosis present

## 2022-06-16 DIAGNOSIS — Z86718 Personal history of other venous thrombosis and embolism: Secondary | ICD-10-CM | POA: Diagnosis not present

## 2022-06-16 DIAGNOSIS — F419 Anxiety disorder, unspecified: Secondary | ICD-10-CM | POA: Diagnosis present

## 2022-06-16 DIAGNOSIS — F32A Depression, unspecified: Secondary | ICD-10-CM | POA: Diagnosis present

## 2022-06-16 DIAGNOSIS — M797 Fibromyalgia: Secondary | ICD-10-CM | POA: Diagnosis present

## 2022-06-16 DIAGNOSIS — S32039A Unspecified fracture of third lumbar vertebra, initial encounter for closed fracture: Secondary | ICD-10-CM | POA: Diagnosis present

## 2022-06-16 DIAGNOSIS — R32 Unspecified urinary incontinence: Secondary | ICD-10-CM | POA: Diagnosis present

## 2022-06-16 DIAGNOSIS — M5136 Other intervertebral disc degeneration, lumbar region: Secondary | ICD-10-CM | POA: Diagnosis present

## 2022-06-16 DIAGNOSIS — M419 Scoliosis, unspecified: Secondary | ICD-10-CM | POA: Diagnosis present

## 2022-06-16 DIAGNOSIS — Z79899 Other long term (current) drug therapy: Secondary | ICD-10-CM | POA: Diagnosis not present

## 2022-06-16 DIAGNOSIS — N12 Tubulo-interstitial nephritis, not specified as acute or chronic: Secondary | ICD-10-CM | POA: Diagnosis present

## 2022-06-16 DIAGNOSIS — N10 Acute tubulo-interstitial nephritis: Secondary | ICD-10-CM | POA: Diagnosis present

## 2022-06-16 DIAGNOSIS — W14XXXA Fall from tree, initial encounter: Secondary | ICD-10-CM | POA: Diagnosis not present

## 2022-06-16 DIAGNOSIS — Z91012 Allergy to eggs: Secondary | ICD-10-CM | POA: Diagnosis not present

## 2022-06-16 DIAGNOSIS — Z888 Allergy status to other drugs, medicaments and biological substances status: Secondary | ICD-10-CM | POA: Diagnosis not present

## 2022-06-16 DIAGNOSIS — M1990 Unspecified osteoarthritis, unspecified site: Secondary | ICD-10-CM | POA: Diagnosis present

## 2022-06-16 DIAGNOSIS — K219 Gastro-esophageal reflux disease without esophagitis: Secondary | ICD-10-CM | POA: Diagnosis present

## 2022-06-16 MED ORDER — OXYCODONE HCL 5 MG PO TABS
5.0000 mg | ORAL_TABLET | Freq: Four times a day (QID) | ORAL | Status: DC
  Administered 2022-06-16 – 2022-06-18 (×9): 5 mg via ORAL
  Filled 2022-06-16 (×9): qty 1

## 2022-06-16 MED ORDER — ALBUTEROL SULFATE (2.5 MG/3ML) 0.083% IN NEBU: 2.5000 mg | INHALATION_SOLUTION | RESPIRATORY_TRACT | Status: DC | PRN

## 2022-06-16 MED ORDER — LORAZEPAM 2 MG/ML IJ SOLN
1.0000 mg | Freq: Once | INTRAMUSCULAR | Status: AC
  Administered 2022-06-16: 1 mg via INTRAVENOUS
  Filled 2022-06-16: qty 1

## 2022-06-16 MED ORDER — METHOCARBAMOL 1000 MG/10ML IJ SOLN
500.0000 mg | Freq: Three times a day (TID) | INTRAVENOUS | Status: DC
  Administered 2022-06-16 – 2022-06-18 (×6): 500 mg via INTRAVENOUS
  Filled 2022-06-16 (×3): qty 5
  Filled 2022-06-16 (×5): qty 500

## 2022-06-16 MED ORDER — HYDROMORPHONE HCL 1 MG/ML IJ SOLN
0.5000 mg | INTRAMUSCULAR | Status: DC | PRN
  Administered 2022-06-16 – 2022-06-17 (×5): 0.5 mg via INTRAVENOUS
  Filled 2022-06-16 (×5): qty 0.5

## 2022-06-16 NOTE — Discharge Instructions (Signed)
   [facility].  Address: [address], [place], [place] [address] Hours:  Closed ? Opens 8:30?AM Mon Phone: [phone]  National Domestic Violence Hotline Hours: 24/7. Languages: English, [place] and 200+ through interpretation service Learn more [phone]    [facility] shelter program provides temporary emergency shelter to individuals and families escaping violent homes. Family Service manages two shelters, [facility] in [place] and [facility] in [place].  More than just a safe place to stay, our shelters provide a stable, supportive environment in which victims and their families can begin to rebuild their lives. Services offered at the shelters include:  Crisis Line: [facility] operates a 24-hour crisis line for victims of domestic violence, sexual assault, rape or other violent crimes. Counseling Services: Each resident of our shelter is offered the opportunity to meet with a therapist to provide individual counseling or group therapy. Child play-therapy groups and parenting classes are also available. Court Advocacy: Our large staff of victim advocates provide clients with help attaining restraining orders (50Bs), going to court and legal referrals. Case Management: Case managers are available to meet routinely with shelter residents to set goals and meet identified needs. These may include qualifying for food stamps, finding a job, applying for public housing and locating childcare. Continuation of Care: When a person prepares to leave the shelter, staff can help them find housing and donated furniture, and prepare for life on their own. Once a family leaves the shelter, they are welcome to return to [facility] for continued individual or group counseling. [place] -- [phone] [facility] -- [phone]

## 2022-06-16 NOTE — Progress Notes (Signed)
PROGRESS NOTE  [name] [name]  DOB: 11/25/1975  PCP: Patient, No Pcp Per UQJ:[id number]  DOA: 06/15/2022  LOS: 0 days  Hospital Day: 2  Brief narrative:  [name] [name] is a 46 y.o. female with PMH significant for anxiety/depression, arthritis, history of disc bulging, fibromyalgia, IBS, migraine, postpartum left leg DVT. Patient was sent to ED from [facility] office today because of severity of her pain. Patient has chronic pain due to fibromyalgia, migraine and has been on and off pain medicines. Few months ago, she had an accident at the trampoline after which she underwent extensive evaluation.  No intracranial injury, cervical injury or any fracture was found.  However she is having more days with general pain and migraine since then. 5 days ago, she was climbing a tree with her kids and fell on her back.  The pain was more severe at first and she tried heat pad and ice pack but it progressively worsened to a point where she is now having difficulty standing and walking.  From yesterday, she started having burning urination as well. Today she presented at [facility].  Because of the level of severity of her pain, she was sent to ED.    In the ED, she was afebrile, hemodynamically stable with blood pressure in low normal range which is her baseline Labs with WC count 13.7, hemoglobin, platelet normal, renal function normal Urine pregnancy test negative Urinalysis with cloudy yellow urine with large amount of leukocytes, positive nitrite and many bacteria CT abdomen pelvis showed minimal right hydroureteronephrosis, wall enhancement of the ureter suspicious for UTI without evidence of obstructing calculus.  CT scan of lumbar spine showed an acute nondisplaced fracture of the left L3 transverse process. CT scan of thoracic spine did not any acute findings. Patient was started on IV fluid, IV antibiotics given for pyelonephritis. ED physician paged [facility] to discuss lumbar  transverse process fracture Hospitalist service called for inpatient admission and management  Subjective: Patient was seen and examined this morning.  Pleasant young Caucasian female.  Lying on bed.  Walking in the room with stooped posture.  She states he had good sleep last night with IV Dilaudid. Underwent MRI lumbar spine this morning which reaffirmed the CT scan finding of left L3 transverse process fracture.  Did not see any evidence of vertebral body fracture or spine or cord compression.  There is chronic disc degeneration at T12-L1 and L5-S1 level.  Assessment/Plan: Intractable right lower back and abdominal pain Due to combination of acute right pyelonephritis as well as L3 transverse process fracture See below for management of individual issues For pain control, patient is currently on scheduled oral Percocet, IV Robaxin and low-dose IV Dilaudid as needed and oral oxycodone as needed.  Apparently allergic to Tylenol.  Acute right pyelonephritis Urinalysis with cloudy yellow urine with large amount of leukocytes, positive nitrite and many bacteria CT imaging showed minimal right hydroureteronephrosis, wall enhancement of the ureter suspicious for UTI without evidence of obstructing calculus.  Urine culture ordered. Trend temperature and WBC count. Recent Labs  Lab 06/15/22 1257  WBC 13.7*   Acute nondisplaced fracture of the left L3 transverse process No evidence of vertebral fracture in thoracic or lumbar level. Seen by Dr. Alma Friendly from [facility]. ED physician also reached out to neurosurgery NP Chrystine Oiler.  MRI lumbar spine was recommended.  Did not see evidence of vertebral body fracture or spinal cord compression. Currently has TLSO brace and pain medicines.  Chronic pain, fibromyalgia, history of  disc bulging On intermittent chronic pain meds at home  History of migraine Follows with [facility] neurology. 8/21, last seen by neurology Dr. Krista Blue at [facility]urology.  She received Botox injection every 3 months.  Also on other oral meds.  Resumed.  History of postpartum left leg DVT Patient reports he had DVT in 2014 after her delivery.  She was shortly on Lovenox and after that on oral pill for few months.  Has not had recurrence since then.  Mobility -PT eval pending  Goals of care - -  Code Status: Full Code full code  Diet:  Diet Order             Diet regular Room service appropriate? Yes; Fluid consistency: Thin  Diet effective now                  DVT prophylaxis: Lovenox subcu ordered but patient refused.  SCDs Place and maintain sequential compression device Start: 06/16/22 1140   Antimicrobials: IV Rocephin Fluid: Normal saline can stop Consultants: [facility] Family Communication: None at bedside  Status is: Observation  Continue in-hospital care because: On IV antibiotics, pending urine culture report.  Intractable pain requiring IV medicines Level of care: Med-Surg   Dispo: The patient is from: Home              Anticipated d/c is to: Home in 1 to 2 days              Patient currently is not medically stable to d/c.   Difficult to place patient No     Infusions:   cefTRIAXone (ROCEPHIN)  IV Stopped (06/15/22 1739)   methocarbamol (ROBAXIN) IV      Scheduled Meds:  oxyCODONE  5 mg Oral Q6H    PRN meds: albuterol, hydrALAZINE, HYDROmorphone (DILAUDID) injection, SUMAtriptan   Antimicrobials: Anti-infectives (From admission, onward)    Start     Dose/Rate Route Frequency Ordered Stop   06/15/22 1600  cefTRIAXone (ROCEPHIN) 1 g in sodium chloride 0.9 % 100 mL IVPB        1 g 200 mL/hr over 30 Minutes Intravenous Every 24 hours 06/15/22 1548     06/15/22 0000  cefdinir (OMNICEF) 300 MG capsule  Status:  Discontinued        300 mg Oral 2 times daily 06/15/22 1550 06/15/22        Objective: Vitals:   06/16/22 0338 06/16/22 0954  BP: (!) 93/58 (!) 96/51  Pulse: 64 66  Resp: 16 20  Temp:  98.3 F (36.8 C) 98 F (36.7 C)  SpO2: 98% 100%    Intake/Output Summary (Last 24 hours) at 06/16/2022 1147 Last data filed at 06/15/2022 1739 Gross per 24 hour  Intake 1100 ml  Output --  Net 1100 ml   Filed Weights   06/15/22 1955  Weight: 63.5 kg   Weight change:  Body mass index is 22.6 kg/m.   Physical Exam: General exam: Pleasant, young Caucasian female.  Pain partially controlled Skin: No rashes, lesions or ulcers. HEENT: Atraumatic, normocephalic, no obvious bleeding Lungs: Clear to auscultation bilaterally CVS: Regular rate and rhythm, no murmur GI/Abd soft, nontender, nondistended, bowel sound present.  Right CVA tenderness.  Left low back tenderness present CNS: Alert, awake, oriented x3. Psychiatry: Mood appropriate Extremities: No pedal edema, no calf tenderness  Data Review: I have personally reviewed the laboratory data and studies available.  F/u labs ordered [employer] (From admission, onward)     Start  Ordered   06/17/22 0500  CBC with Differential/Platelet  Daily at 5am,   R      06/16/22 1144   06/16/22 1144  Urine Culture  (Urine Culture)  Once,   R       Comments: UA on 8/26 was positive.  Not sure why reflex culture was not done   Question:  Indication  Answer:  Dysuria   06/16/22 1144            Signed, Terrilee Croak, MD [facility] 06/16/2022

## 2022-06-16 NOTE — Evaluation (Addendum)
Physical Therapy Evaluation Patient Details Name: [name] [name] MRN: [id number] DOB: 01-07-1976 Today's Date: 06/16/2022    History of Present Illness    46 y.o. female sent to ED from [facility] office today because of severity of her pain.admitted with UTI. MRI+ CT scan of lumbar spine showed an acute nondisplaced fracture of the left L3 transverse process. OIZ:[id number], arthritis, history of disc bulging, fibromyalgia, IBS, migraine, postpartum left leg DVT.       Clinical Impression  Pt admitted with above diagnosis.  Pt reports independence at baseline. Today pt with heavy reliance on UEs, unsteady gait with significantly flexed posture. Pt asking about driving and returning to aquatic therapy-will defer to MD. Will follow in acute setting Anticipate pt will benefit from OPPT  Pt currently with functional limitations due to the deficits listed below (see PT Problem List). Pt will benefit from skilled PT to increase their independence and safety with mobility to allow discharge to the venue listed below.          Recommendations for follow up therapy are one component of a multi-disciplinary discharge planning process, led by the attending physician.  Recommendations may be updated based on patient status, additional functional criteria and insurance authorization.  Follow Up Recommendations Outpatient PT      Assistance Recommended at Discharge Intermittent Supervision/Assistance  Patient can return home with the following  Assistance with cooking/housework;Assist for transportation;Help with stairs or ramp for entrance    Equipment Recommendations Rolling walker (2 wheels)  Recommendations for Other Services       Functional Status Assessment Patient has had a recent decline in their functional status and demonstrates the ability to make significant improvements in function in a reasonable and predictable amount of time.     Precautions / Restrictions  Precautions Precautions: Fall;Back Required Braces or Orthoses: Spinal Brace Spinal Brace: Thoracolumbosacral orthotic;Applied in sitting position;Applied in standing position Other Brace: educated on donning/doffing brace; for pt comfort per Dr Veverly Fells Restrictions Weight Bearing Restrictions: No      Mobility  Bed Mobility Overal bed mobility: Needs Assistance Bed Mobility: Rolling, Sidelying to Sit, Sit to Sidelying Rolling: Supervision Sidelying to sit: Supervision     Sit to sidelying: Supervision General bed mobility comments: cues for log roll technique, supervision for safety    Transfers Overall transfer level: Needs assistance Equipment used: Rolling walker (2 wheels) Transfers: Sit to/from Stand Sit to Stand: Min guard           General transfer comment: cues for hand placement, posture, to power up and down with LEs from varied ht surfaces; min/guard for safety    Ambulation/Gait Ambulation/Gait assistance: Min guard Gait Distance (Feet): 20 Feet (12') Assistive device: Rolling walker (2 wheels) Gait Pattern/deviations: Step-through pattern, Knee flexed in stance - right, Knee flexed in stance - left, Decreased stride length, Trunk flexed, Narrow base of support, Scissoring Gait velocity: decr     General Gait Details: cues for RW position, trunk/knee/hip extension as able to tolerate. min/guard for safety. cues to avoid scissoring and stay inside frame of RW; attempted without device, pt heavy reliance on UEs, furniture amb; distance limtied by dizziness, BP 96/51 prior to amb, 106/59 after  Stairs            Wheelchair Mobility    Modified Rankin (Stroke Patients Only)       Balance Overall balance assessment: Needs assistance Sitting-balance support: Feet supported, No upper extremity supported Sitting balance-Leahy Scale: Good     Standing  balance support: During functional activity, Reliant on assistive device for balance Standing  balance-Leahy Scale: Poor Standing balance comment: able to static stand without UE support                             Pertinent Vitals/Pain Pain Assessment Pain Assessment: Faces Faces Pain Scale: Hurts whole lot Pain Location: lower back Pain Descriptors / Indicators: Grimacing, Guarding Pain Intervention(s): Limited activity within patient's tolerance, Monitored during session, Premedicated before session, Repositioned    Home Living Family/patient expects to be discharged to:: Private residence Living Arrangements: Children Available Help at Discharge: Family;Available PRN/intermittently (3 dtrs, oldest 6) Type of Home: House Home Access: Stairs to enter Entrance Stairs-Rails: Right Entrance Stairs-Number of Steps: 6   Home Layout: One level Home Equipment: None      Prior Function Prior Level of Function : Independent/Modified Independent;Driving             Mobility Comments: pt reports full independence, caring for 3 kids (13, 12, and 8). likes to do yoga, reports doing aquatic PT prior to admission for chronic back pain ADLs Comments: independent     Hand Dominance        Extremity/Trunk Assessment   Upper Extremity Assessment Upper Extremity Assessment: Overall WFL for tasks assessed    Lower Extremity Assessment Lower Extremity Assessment: Generalized weakness    Cervical / Trunk Assessment Cervical / Trunk Assessment:  (hx of scoliosis per pt)  Communication   Communication: No difficulties  Cognition Arousal/Alertness: Awake/alert Behavior During Therapy: WFL for tasks assessed/performed Overall Cognitive Status: Within Functional Limits for tasks assessed                                          General Comments      Exercises     Assessment/Plan    PT Assessment Patient needs continued PT services  PT Problem List Decreased strength;Decreased mobility;Decreased activity tolerance;Decreased  balance;Decreased knowledge of use of DME;Pain       PT Treatment Interventions DME instruction;Therapeutic exercise;Gait training;Therapeutic activities;Functional mobility training;Patient/family education;Stair training    PT Goals (Current goals can be found in the Care Plan section)  Acute Rehab PT Goals Patient Stated Goal: drive, get backto PT and doing yoga PT Goal Formulation: With patient Time For Goal Achievement: 06/29/22 Potential to Achieve Goals: Good    Frequency Min 3X/week     Co-evaluation               AM-PAC PT "6 Clicks" Mobility  Outcome Measure Help needed turning from your back to your side while in a flat bed without using bedrails?: A Little Help needed moving from lying on your back to sitting on the side of a flat bed without using bedrails?: A Little Help needed moving to and from a bed to a chair (including a wheelchair)?: A Little Help needed standing up from a chair using your arms (e.g., wheelchair or bedside chair)?: A Little Help needed to walk in hospital room?: A Little Help needed climbing 3-5 steps with a railing? : A Lot 6 Click Score: 17    End of Session Equipment Utilized During Treatment: Gait belt Activity Tolerance: Patient tolerated treatment well;Patient limited by pain Patient left: in bed;with call bell/phone within reach;with bed alarm set Nurse Communication: Mobility status PT Visit Diagnosis: Other abnormalities of  gait and mobility (R26.89);Difficulty in walking, not elsewhere classified (R26.2);Pain Pain - Right/Left: Left Pain - part of body:  (low back)    Time: 1453-1540 PT Time Calculation (min) (ACUTE ONLY): 47 min   Charges:   PT Evaluation $PT Eval Low Complexity: 1 Low PT Treatments $Gait Training: 8-22 mins        Baxter Flattery, PT  Acute Rehab Dept [facility]) [phone]  [facility] Weekend Pager [facility] only)  [phone]  06/16/2022   [facility] 06/16/2022, 3:59 PM

## 2022-06-17 ENCOUNTER — Inpatient Hospital Stay (HOSPITAL_COMMUNITY): Payer: Medicare Other

## 2022-06-17 DIAGNOSIS — N12 Tubulo-interstitial nephritis, not specified as acute or chronic: Secondary | ICD-10-CM | POA: Diagnosis not present

## 2022-06-17 LAB — CBC
HCT: 39 % (ref 36.0–46.0)
Hemoglobin: 11.9 g/dL — ABNORMAL LOW (ref 12.0–15.0)
MCH: 28.7 pg (ref 26.0–34.0)
MCHC: 30.5 g/dL (ref 30.0–36.0)
MCV: 94.2 fL (ref 80.0–100.0)
Platelets: 267 10*3/uL (ref 150–400)
RBC: 4.14 MIL/uL (ref 3.87–5.11)
RDW: 13.7 % (ref 11.5–15.5)
WBC: 6.3 10*3/uL (ref 4.0–10.5)
nRBC: 0 % (ref 0.0–0.2)

## 2022-06-17 LAB — URINE CULTURE: Culture: NO GROWTH

## 2022-06-17 MED ORDER — LIDOCAINE 5 % EX PTCH
1.0000 | MEDICATED_PATCH | Freq: Every day | CUTANEOUS | Status: DC
  Administered 2022-06-17 – 2022-06-18 (×2): 1 via TRANSDERMAL
  Filled 2022-06-17 (×2): qty 1

## 2022-06-17 MED ORDER — CEFDINIR 300 MG PO CAPS
300.0000 mg | ORAL_CAPSULE | Freq: Two times a day (BID) | ORAL | 0 refills | Status: AC
Start: 2022-06-17 — End: 2022-06-24

## 2022-06-17 MED ORDER — CEFDINIR 300 MG PO CAPS
300.0000 mg | ORAL_CAPSULE | Freq: Two times a day (BID) | ORAL | Status: DC
  Administered 2022-06-17 – 2022-06-18 (×3): 300 mg via ORAL
  Filled 2022-06-17 (×5): qty 1

## 2022-06-17 MED ORDER — HYDROMORPHONE HCL 1 MG/ML IJ SOLN
1.0000 mg | INTRAMUSCULAR | Status: DC | PRN
  Administered 2022-06-17 – 2022-06-18 (×6): 1 mg via INTRAVENOUS
  Filled 2022-06-17 (×6): qty 1

## 2022-06-17 MED ORDER — METHOCARBAMOL 750 MG PO TABS: 750.0000 mg | ORAL_TABLET | Freq: Four times a day (QID) | ORAL | 0 refills | Status: AC

## 2022-06-17 MED ORDER — LIDOCAINE 4 % EX PTCH: 1.0000 | MEDICATED_PATCH | Freq: Every day | CUTANEOUS | 0 refills | Status: AC | PRN

## 2022-06-17 MED ORDER — LIDOCAINE 5 % EX PTCH
1.0000 | MEDICATED_PATCH | CUTANEOUS | Status: DC
  Filled 2022-06-17: qty 1

## 2022-06-17 MED ORDER — HYDROMORPHONE HCL 2 MG PO TABS: 2.0000 mg | ORAL_TABLET | Freq: Four times a day (QID) | ORAL | 0 refills | Status: AC | PRN

## 2022-06-17 MED ORDER — SODIUM CHLORIDE 0.9 % IV SOLN: INTRAVENOUS | Status: DC

## 2022-06-17 NOTE — TOC Transition Note (Addendum)
Transition of Care [facility]) - CM/SW Discharge Note   Patient Details  Name: [name] [name] MRN: [id number] Date of Birth: 1975-11-23  Transition of Care [facility]) CM/SW Contact:  Dessa Phi, RN Phone Number: 06/17/2022, 2:18 PM   Clinical Narrative:Went into rm to discuss d/c plans-informed patient of resources available for assist @ home-private duty care services through [employer] office or private duty care agencies she can call or research online. Otpt PT referral placed. Patient w/additional concerns about social work needs-TOC CSW to contact patient & discuss concerns.  Rw from [facility] in rm for patient @ d/c.    Final next level of care: OP Rehab Barriers to Discharge: No Barriers Identified   Patient Goals and CMS Choice        Discharge Placement                       Discharge Plan and Services                                     Social Determinants of Health (SDOH) Interventions     Readmission Risk Interventions     No data to display

## 2022-06-17 NOTE — TOC Progression Note (Signed)
Transition of Care [facility]) - Progression Note    Patient Details  Name: [name] [name] MRN: [id number] Date of Birth: January 23, 1976  [facility]) CM/SW Contact  Raziel Koenigs, Juliann Pulse, RN Phone Number: 06/17/2022, 3:00 PM  Clinical Narrative: Noted PT to see patient. Continue to monitor for d/c needs.      Expected Discharge Plan: OP Rehab Barriers to Discharge: Continued Medical Work up  Expected Discharge Plan and Services Expected Discharge Plan: OP Rehab         Expected Discharge Date: 06/17/22                                     Social Determinants of Health (SDOH) Interventions    Readmission Risk Interventions     No data to display

## 2022-06-17 NOTE — TOC Progression Note (Signed)
Transition of Care [facility]) - Progression Note    Patient Details  Name: [name] [name] MRN: [id number] Date of Birth: December 29, 1975  Transition of Care [facility]) CM/SW Contact  Ross Ludwig, [place] Phone Number: 06/17/2022, 2:46 PM  Clinical Narrative:     CSW was informed that patient would like to speak to [place].  CSW spoke to patient, and she stated she needed assistance for transportation, housing, and in home care services.  CSW explained to her that she would have to contact [place] and talk to the housing department to see if they can assist.  CSW also informed her that there is a Associate Professor that she can sign up for, and she would have to Teaching laboratory technician to sign up for Medicaid transportation.  CSW provided contact information on patient's AVS.  CSW to continue to follow patient's progress throughout discharge planning.  Expected Discharge Plan: OP Rehab Barriers to Discharge: No Barriers Identified  Expected Discharge Plan and Services Expected Discharge Plan: OP Rehab         Expected Discharge Date: 06/17/22                                     Social Determinants of Health (SDOH) Interventions    Readmission Risk Interventions     No data to display

## 2022-06-17 NOTE — Discharge Summary (Signed)
Physician Discharge Summary  [name] [name] ERX:[id number] DOB: 1976/08/06 DOA: 06/15/2022  PCP: Patient, No Pcp Per  Admit date: 06/15/2022 Discharge date: 06/17/2022  Admitted From: Home Discharge disposition: Home  Recommendations at discharge:  Continue antibiotics for neck 7 days with probiotics to complete 10-day course Judicious use of pain medicines Follow-up with pain management Follow-up with orthopedics    Brief narrative:  [name] [name] is a 46 y.o. female with PMH significant for anxiety/depression, arthritis, history of disc bulging, fibromyalgia, IBS, migraine, postpartum left leg DVT. Patient was sent to ED from [facility] office today because of severity of her pain. Patient has chronic pain due to fibromyalgia, migraine and has been on and off pain medicines. Few months ago, she had an accident at the trampoline after which she underwent extensive evaluation.  No intracranial injury, cervical injury or any fracture was found.  However she is having more days with general pain and migraine since then. 5 days ago, she was climbing a tree with her kids and fell on her back.  The pain was more severe at first and she tried heat pad and ice pack but it progressively worsened to a point where she is now having difficulty standing and walking.  From yesterday, she started having burning urination as well. Today she presented at [facility].  Because of the level of severity of her pain, she was sent to ED.    In the ED, she was afebrile, hemodynamically stable with blood pressure in low normal range which is her baseline Labs with WBC count 13.7, hemoglobin, platelet normal, renal function normal Urine pregnancy test negative Urinalysis with cloudy yellow urine with large amount of leukocytes, positive nitrite and many bacteria CT abdomen pelvis showed minimal right hydroureteronephrosis, wall enhancement of the ureter suspicious for UTI without evidence of obstructing  calculus.  CT scan of lumbar spine showed an acute nondisplaced fracture of the left L3 transverse process. CT scan of thoracic spine did not any acute findings. Patient was started on IV fluid, IV antibiotics given for pyelonephritis. ED physician paged [facility] to discuss lumbar transverse process fracture Hospitalist service called for inpatient admission and management  Subjective: Patient was seen and examined this morning.   Lying on bed.  Pain tolerable on oral and IV pain meds.  She had a fall in the bathroom last night.  CT head was checked which was negative for any intracranial injury.Marland Kitchen  Hospital course: Acute right pyelonephritis Urinalysis with cloudy yellow urine with large amount of leukocytes, positive nitrite and many bacteria CT imaging showed minimal right hydroureteronephrosis, wall enhancement of the ureter suspicious for UTI without evidence of obstructing calculus.  Urine culture has not shown any growth so far.  Clinically improving on IV Rocephin.  We will switch her to oral cefdinir and discharge on it for 7 days to complete 10-day course.  Along with probiotics  Acute nondisplaced fracture of the left L3 transverse process No evidence of vertebral fracture in thoracic or lumbar level. Seen by Dr. Alma Friendly from [place]. ED physician also reached out to neurosurgery NP Chrystine Oiler.  MRI lumbar spine was ordered which did not see evidence of vertebral body fracture or spinal cord compression. Currently on lidocaine patch, cold compression, TLSO brace and pain medicines.  Intractable right lower back and abdominal pain Due to combination of acute right pyelonephritis as well as L3 transverse process fracture See below for management of individual issues For pain control, patient getting both pharmacological and nonpharmacological  methods of treatment.  Continue cold compression, TLSO brace  Currently on scheduled oral Percocet, IV Robaxin and low-dose IV  Dilaudid.  Reports allergy to Tylenol.  She requests requests few days of oral Dilaudid till she gets to see her pain management doctor Dr. Nancy Fetter. We will discharge on oral Robaxin, oral Dilaudid for 7 days. Follow-up with pain management.  Chronic pain, fibromyalgia, history of disc bulging On intermittent chronic pain meds at home  History of migraine Follows with [facility] neurology. 8/21, last seen by neurology Dr. Krista Blue at [facility] neurology.  She received Botox injection every 3 months.  Also on other oral meds.  Resumed.  History of postpartum left leg DVT Patient reports he had DVT in 2014 after her delivery.  She was shortly on Lovenox and after that on oral pill for few months.  Has not had recurrence since then.  Mobility -PT eval obtained.  Outpatient follow-up recommended  Wounds:  - Incision (Closed) 07/28/14 Abdomen Left (Active)  Date First Assessed/Time First Assessed: 07/28/14 1513   Location: Abdomen  Location Orientation: Left    Assessments 07/28/2014  3:30 PM 07/29/2014  9:00 AM  Dressing Type Liquid skin adhesive Liquid skin adhesive  Site / Wound Assessment Clean;Dry Clean;Dry  Closure Skin glue Skin glue     No associated orders.     Incision - 2 Ports Abdomen 1: Left;Lateral 2: Left;Lower (Active)  Placement Date/Time: 07/28/14 1450   Location of Ports: Abdomen  Port: 1:  Location Orientation: Left;Lateral  Port: 2:  Location Orientation: Left;Lower    Assessments 07/28/2014  4:15 PM 07/29/2014  9:00 AM  Port 1 Site Assessment Clean;Dry Clean;Dry  Port 1 Dressing Type Liquid skin adhesive Liquid skin adhesive  Port 2 Site Assessment Clean;Dry Clean;Dry  Port 2 Dressing Type Liquid skin adhesive Liquid skin adhesive     No associated orders.    Discharge Exam:   Vitals:   06/16/22 1451 06/16/22 2120 06/17/22 0004 06/17/22 0519  BP: 101/62 (!) 99/54 (!) 103/57 119/74  Pulse: 64 73 94 72  Resp: '20 19 18 19  '$ Temp: 98.6 F (37 C) 98.2 F (36.8 C) (!) 97.3  F (36.3 C)   TempSrc: Oral Oral Oral   SpO2: 100% 99% 97% 100%  Weight:      Height:        Body mass index is 22.6 kg/m.   General exam: Pleasant, young Caucasian female.  Pain partially controlled Skin: No rashes, lesions or ulcers. HEENT: Atraumatic, normocephalic, no obvious bleeding Lungs: Clear to auscultation bilaterally CVS: Regular rate and rhythm, no murmur GI/Abd soft, nontender, nondistended, bowel sound present.  Right CVA tenderness improving.  Left low back tenderness improving CNS: Alert, awake, oriented x3. Psychiatry: Mood appropriate Extremities: No pedal edema, no calf tenderness  Follow ups:    Follow-up Information     Donato Heinz, MD Follow up.   Specialties: Cardiology, Radiology Contact information: [address] [place] [address] [phone]         Marvis Moeller, NP Follow up.   Specialty: Neurosurgery Contact information: [address] [place] [address] [phone]                 Discharge Instructions:   Discharge Instructions     Ambulatory referral to Pain Clinic   Complete by: As directed    Ambulatory referral to Spine Surgery   Complete by: As directed    Call MD for:  difficulty  breathing, headache or visual disturbances   Complete by: As directed    Call MD for:  extreme fatigue   Complete by: As directed    Call MD for:  hives   Complete by: As directed    Call MD for:  persistant dizziness or light-headedness   Complete by: As directed    Call MD for:  persistant nausea and vomiting   Complete by: As directed    Call MD for:  severe uncontrolled pain   Complete by: As directed    Call MD for:  temperature >100.4   Complete by: As directed    Diet general   Complete by: As directed    Discharge instructions   Complete by: As directed    Recommendations at discharge:   Continue antibiotics for neck 7 days with probiotics to complete 10-day  course  Judicious use of pain medicines  Follow-up with pain management  Follow-up with orthopedics  General discharge instructions: Follow with Primary MD Patient, No Pcp Per in 7 days  Please request your PCP  to go over your hospital tests, procedures, radiology results at the follow up. Please get your medicines reviewed and adjusted.  Your PCP may decide to repeat certain labs or tests as needed. Do not drive, operate heavy machinery, perform activities at heights, swimming or participation in water activities or provide baby sitting services if your were admitted for syncope or siezures until you have seen by Primary MD or a Neurologist and advised to do so again. [place] Controlled Substance Reporting System database was reviewed. Do not drive, operate heavy machinery, perform activities at heights, swim, participate in water activities or provide baby-sitting services while on medications for pain, sleep and mood until your outpatient physician has reevaluated you and advised to do so again.  You are strongly recommended to comply with the dose, frequency and duration of prescribed medications. Activity: As tolerated with Full fall precautions use walker/cane & assistance as needed Avoid using any recreational substances like cigarette, tobacco, alcohol, or non-prescribed drug. If you experience worsening of your admission symptoms, develop shortness of breath, life threatening emergency, suicidal or homicidal thoughts you must seek medical attention immediately by calling 911 or calling your MD immediately  if symptoms less severe. You must read complete instructions/literature along with all the possible adverse reactions/side effects for all the medicines you take and that have been prescribed to you. Take any new medicine only after you have completely understood and accepted all the possible adverse reactions/side effects.  Wear Seat belts while driving. You were cared for by a  hospitalist during your hospital stay. If you have any questions about your discharge medications or the care you received while you were in the hospital after you are discharged, you can call the unit and ask to speak with the hospitalist or the covering physician. Once you are discharged, your primary care physician will handle any further medical issues. Please note that NO REFILLS for any discharge medications will be authorized once you are discharged, as it is imperative that you return to your primary care physician (or establish a relationship with a primary care physician if you do not have one).   Increase activity slowly   Complete by: As directed        Discharge Medications:   Allergies as of 06/17/2022       Reactions   Latex Rash   Acetaminophen Other (See Comments)   Itchy, hives, rash, stomach pain, nausea  Influenza Vaccines    Metoprolol Other (See Comments)   dizziness   Venlafaxine    Reports one time seizure on this medication (occurred 20+ years ago)   Verapamil Nausea Only   Headache, dizziness        Medication List     STOP taking these medications    BOTOX IM   Oxycodone HCl 10 MG Tabs       TAKE these medications    amphetamine-dextroamphetamine 20 MG tablet Commonly known as: ADDERALL Take 20 mg by mouth 2 (two) times daily as needed (adhd). What changed: Another medication with the same name was removed. Continue taking this medication, and follow the directions you see here.   CANNABIDIOL PO Take 2 tablets by mouth daily as needed (pain relief).   cefdinir 300 MG capsule Commonly known as: OMNICEF Take 1 capsule (300 mg total) by mouth every 12 (twelve) hours for 7 days.   HYDROmorphone 2 MG tablet Commonly known as: Dilaudid Take 1 tablet (2 mg total) by mouth every 6 (six) hours as needed for up to 7 days for severe pain.   hydrOXYzine 10 MG tablet Commonly known as: ATARAX Take 1 tablet (10 mg total) by mouth as needed. What  changed:  when to take this reasons to take this   ibuprofen 800 MG tablet Commonly known as: ADVIL Take 800 mg by mouth every 8 (eight) hours as needed for moderate pain.   IRON PO Take 1 tablet by mouth daily as needed (low iron).   lidocaine 4 % Place 1 patch onto the skin daily as needed for up to 14 days (pain).   methocarbamol 750 MG tablet Commonly known as: Robaxin-750 Take 1 tablet (750 mg total) by mouth 4 (four) times daily for 14 days.   multivitamin capsule Take 1 capsule by mouth daily.   ondansetron 4 MG disintegrating tablet Commonly known as: Zofran ODT Take 1 tablet (4 mg total) by mouth every 8 (eight) hours as needed.   rizatriptan 10 MG disintegrating tablet Commonly known as: Maxalt-MLT Take 1 tablet (10 mg total) by mouth as needed. May repeat in 2 hours if needed What changed:  when to take this reasons to take this additional instructions   SUMAtriptan 100 MG tablet Commonly known as: Imitrex Take 1 tablet (100 mg total) by mouth every 2 (two) hours as needed for migraine. May repeat in 2 hours if headache persists or recurs.   VITAMIN D PO Take 10,000 Units by mouth every other day.               Durable Medical Equipment  (From admission, onward)           Start     Ordered   06/16/22 1603  For home use only DME Walker rolling  Once       Question Answer Comment  Walker: With 5 Inch Wheels   Patient needs a walker to treat with the following condition Difficulty in walking, not elsewhere classified      06/16/22 1602             The results of significant diagnostics from this hospitalization (including imaging, microbiology, ancillary and laboratory) are listed below for reference.    Procedures and Diagnostic Studies:   MR LUMBAR SPINE WO CONTRAST  Result Date: 06/16/2022 CLINICAL DATA:  Fall from tree 6 days ago.  Severe back pain EXAM: MRI LUMBAR SPINE WITHOUT CONTRAST TECHNIQUE: Multiplanar, multisequence MR  imaging of the lumbar  spine was performed. No intravenous contrast was administered. COMPARISON:  CT from earlier today FINDINGS: Segmentation:  5 lumbar type vertebrae Alignment:  Physiologic. Vertebrae: Known L3 left transverse process fracture with marrow edema. No occult vertebral body fracture. Conus medullaris and cauda equina: Conus extends to the T12-L1 level. Conus and cauda equina appear normal. Paraspinal and other soft tissues: Strain of intrinsic back muscles and psoas on the left. Cholelithiasis. Disc levels: T12-L1 disc desiccation and narrowing with shallow central protrusion. L5-S1 disc desiccation and narrowing with small left paracentral protrusion. Negative facets. No neural impingement IMPRESSION: 1. L3 left transverse process fracture with adjacent strain. 2. Disc degeneration without impingement at T12-L1 and L5-S1. Electronically Signed   By: Jorje Guild M.D.   On: 06/16/2022 10:56   CT L-SPINE NO CHARGE  Result Date: 06/15/2022 CLINICAL DATA:  Right-sided low back pain EXAM: CT LUMBAR SPINE WITHOUT CONTRAST TECHNIQUE: Multidetector CT imaging of the lumbar spine was performed without intravenous contrast administration. Multiplanar CT image reconstructions were also generated. RADIATION DOSE REDUCTION: This exam was performed according to the departmental dose-optimization program which includes automated exposure control, adjustment of the mA and/or kV according to patient size and/or use of iterative reconstruction technique. COMPARISON:  CT 02/18/2022, MRI 02/19/2022 FINDINGS: Segmentation: 5 lumbar type vertebrae. Alignment: Normal. Vertebrae: Acute nondisplaced fracture of the left L3 transverse process (series 4, image 53). No additional fractures. No focal pathologic process. Paraspinal and other soft tissues: Prominent urothelial enhancement of the right ureter. Cholelithiasis. See dedicated same day CT of the abdomen and pelvis for full characterization of the abdominopelvic  findings. Disc levels: Intervertebral disc heights are preserved. Facet joints within normal limits. IMPRESSION: 1. Acute nondisplaced fracture of the left L3 transverse process. 2. Prominent urothelial enhancement of the right ureter, which may represent ascending urinary tract infection. 3. Cholelithiasis. See dedicated same day CT of the abdomen and pelvis for full characterization of the abdominopelvic findings. Electronically Signed   By: Davina Poke D.O.   On: 06/15/2022 15:31   CT Thoracic Spine Wo Contrast  Result Date: 06/15/2022 CLINICAL DATA:  Back pain EXAM: CT THORACIC SPINE WITHOUT CONTRAST TECHNIQUE: Multidetector CT images of the thoracic were obtained using the standard protocol without intravenous contrast. RADIATION DOSE REDUCTION: This exam was performed according to the departmental dose-optimization program which includes automated exposure control, adjustment of the mA and/or kV according to patient size and/or use of iterative reconstruction technique. COMPARISON:  02/17/2022 FINDINGS: Alignment: Mild thoracic dextrocurvature.  No static listhesis. Vertebrae: No acute fracture. Benign sclerotic bone island within the T2 vertebral body which was low in signal on T1 and T2 weighted images on MRI from 02/17/2022. No suspicious lytic or sclerotic bone lesion. Paraspinal and other soft tissues: Negative. Disc levels: Intervertebral disc heights are preserved. Similar degree of mild right-sided facet arthropathy within the upper to midthoracic spine. IMPRESSION: 1. No acute fracture or static listhesis of the thoracic spine. 2. Similar degree of mild right-sided facet arthropathy within the upper to midthoracic spine. Electronically Signed   By: Davina Poke D.O.   On: 06/15/2022 15:25   CT Abdomen Pelvis W Contrast  Result Date: 06/15/2022 CLINICAL DATA:  Right lower quadrant abdominal pain. EXAM: CT ABDOMEN AND PELVIS WITH CONTRAST TECHNIQUE: Multidetector CT imaging of the  abdomen and pelvis was performed using the standard protocol following bolus administration of intravenous contrast. RADIATION DOSE REDUCTION: This exam was performed according to the departmental dose-optimization program which includes automated exposure control, adjustment of the  mA and/or kV according to patient size and/or use of iterative reconstruction technique. CONTRAST:  196m OMNIPAQUE IOHEXOL 300 MG/ML  SOLN COMPARISON:  July 28, 2014. FINDINGS: Lower chest: No acute abnormality. Hepatobiliary: Mild cholelithiasis is noted. No biliary dilatation is noted. Stable hepatic cysts. Periportal hypodensity is noted suggesting edema. Pancreas: Unremarkable. No pancreatic ductal dilatation or surrounding inflammatory changes. Spleen: Normal in size without focal abnormality. Adrenals/Urinary Tract: Adrenal glands appear normal. Minimal right hydroureteronephrosis is noted without evidence of obstructing calculus; some wall enhancement of the ureter is noted and ascending urinary tract infection cannot be excluded. Urinary bladder is unremarkable. Stomach/Bowel: Stomach is unremarkable. Status post appendectomy. There is no evidence of bowel obstruction or inflammation. Vascular/Lymphatic: No significant vascular findings are present. No enlarged abdominal or pelvic lymph nodes. Reproductive: Uterus and bilateral adnexa are unremarkable. Other: No abdominal wall hernia or abnormality. No abdominopelvic ascites. Musculoskeletal: No acute or significant osseous findings. IMPRESSION: Periportal edema is noted in the liver of uncertain etiology. Mild cholelithiasis is noted. Minimal right hydroureteronephrosis is noted without evidence of obstructing calculus. Wall enhancement of the ureter is noted and ascending urinary tract infection cannot be excluded. Electronically Signed   By: JMarijo ConceptionM.D.   On: 06/15/2022 15:25     Labs:   Basic Metabolic Panel: Recent Labs  Lab 06/15/22 1257  NA 139  K  3.6  CL 105  CO2 26  GLUCOSE 111*  BUN 12  CREATININE 0.70  CALCIUM 9.2   GFR Estimated Creatinine Clearance: 82.3 mL/min (by C-G formula based on SCr of 0.7 mg/dL). Liver Function Tests: Recent Labs  Lab 06/15/22 1257  AST 27  ALT 23  ALKPHOS 36*  BILITOT 0.6  PROT 7.2  ALBUMIN 4.3   Recent Labs  Lab 06/15/22 1257  LIPASE 24   No results for input(s): "AMMONIA" in the last 168 hours. Coagulation profile No results for input(s): "INR", "PROTIME" in the last 168 hours.  CBC: Recent Labs  Lab 06/15/22 1257  WBC 13.7*  NEUTROABS 11.9*  HGB 12.3  HCT 39.1  MCV 90.9  PLT 273   Cardiac Enzymes: No results for input(s): "CKTOTAL", "CKMB", "CKMBINDEX", "TROPONINI" in the last 168 hours. BNP: Invalid input(s): "POCBNP" CBG: No results for input(s): "GLUCAP" in the last 168 hours. D-Dimer No results for input(s): "DDIMER" in the last 72 hours. Hgb A1c No results for input(s): "HGBA1C" in the last 72 hours. Lipid Profile No results for input(s): "CHOL", "HDL", "LDLCALC", "TRIG", "CHOLHDL", "LDLDIRECT" in the last 72 hours. Thyroid function studies No results for input(s): "TSH", "T4TOTAL", "T3FREE", "THYROIDAB" in the last 72 hours.  Invalid input(s): "FREET3" Anemia work up No results for input(s): "VITAMINB12", "FOLATE", "FERRITIN", "TIBC", "IRON", "RETICCTPCT" in the last 72 hours. Microbiology No results found for this or any previous visit (from the past 240 hour(s)).  Time coordinating discharge: 35 minutes  Signed: Kendel Pesnell  [facility] 06/17/2022, 11:10 AM

## 2022-06-17 NOTE — Progress Notes (Signed)
Patient prefers to sleep at the window couch. Patient states bed is uncomfortable and does not help w/ back pain. The importance of being in bed and having bed alarm on explained to patient. Patient excepts the risks and states she prefers the bed alarm to be off and to be able to ambulate freely in the room.

## 2022-06-17 NOTE — Progress Notes (Signed)
Patient refuses bed alarm. Provided education regarding bed alarm as per hospital policy, patient continue to refused bed alarm.

## 2022-06-17 NOTE — Progress Notes (Signed)
Patient reported to RN that she fell in the bathroom, unwitnessed. Patient's visitor remain at the bedside, not aware of the fall in the bathroom. Patient unsure if she hit her head, ROM all extremities WNL. Vital signs taken and documented. Dr. Hal Hope was notified and received orders.

## 2022-06-17 NOTE — Plan of Care (Signed)

## 2022-06-17 NOTE — Progress Notes (Signed)
   06/17/22 0034  What Happened  Was fall witnessed? No  Was patient injured? Unsure  Found by No one-pt stated they fell  Stated prior activity bathroom-unassisted  Follow Up  MD notified Dr. Hal Hope  Time MD notified [phone]  Family notified Yes - comment (present at bedside)  Time family notified 0000  Additional tests Yes-comment (CT head, Xray)  Progress note created (see row info) Yes  Adult Fall Risk Assessment  Risk Factor Category (scoring not indicated) High fall risk per protocol (document High fall risk)  Age 46  Fall History: Fall within 6 months prior to admission 5  Elimination; Bowel and/or Urine Incontinence 0  Elimination; Bowel and/or Urine Urgency/Frequency 0  Medications: includes PCA/Opiates, Anti-convulsants, Anti-hypertensives, Diuretics, Hypnotics, Laxatives, Sedatives, and Psychotropics 3  Patient Care Equipment 1  Mobility-Assistance 2  Mobility-Gait 0  Mobility-Sensory Deficit 0  Altered awareness of immediate physical environment 0  Impulsiveness 0  Lack of understanding of one's physical/cognitive limitations 0  Total Score 11  Patient Fall Risk Level Moderate fall risk  Adult Fall Risk Interventions  Required Bundle Interventions *See Row Information* Moderate fall risk - low and moderate requirements implemented  Glasgow Coma Scale  Eye Opening 4  Best Verbal Response (NON-intubated) 5  Best Motor Response 6  Glasgow Coma Scale Score 15     06/17/22 0010  Neurological  Level of Consciousness Alert  Orientation Level Oriented X4  Cognition Appropriate at baseline;Appropriate attention/concentration;Appropriate judgement;Appropriate safety awareness;Appropriate for developmental age  Speech Clear  Neuro Symptoms None  Musculoskeletal  Generalized Weakness Yes  Musculoskeletal Details  RUE Full movement  LUE Full movement  RLE Weakness  LLE Weakness  Lower Back Limited movement  Integumentary  Integumentary (WDL) WDL   Alerted by  primary nurse that pt had an unwitnessed fall while going to the restroom. On call MD made aware. See new orders.

## 2022-06-18 DIAGNOSIS — N12 Tubulo-interstitial nephritis, not specified as acute or chronic: Secondary | ICD-10-CM | POA: Diagnosis not present

## 2022-06-18 LAB — CBC WITH DIFFERENTIAL/PLATELET
Abs Immature Granulocytes: 0.02 10*3/uL (ref 0.00–0.07)
Basophils Absolute: 0 10*3/uL (ref 0.0–0.1)
Basophils Relative: 1 %
Eosinophils Absolute: 0.2 10*3/uL (ref 0.0–0.5)
Eosinophils Relative: 3 %
HCT: 37.3 % (ref 36.0–46.0)
Hemoglobin: 11.7 g/dL — ABNORMAL LOW (ref 12.0–15.0)
Immature Granulocytes: 0 %
Lymphocytes Relative: 29 %
Lymphs Abs: 1.4 10*3/uL (ref 0.7–4.0)
MCH: 28.9 pg (ref 26.0–34.0)
MCHC: 31.4 g/dL (ref 30.0–36.0)
MCV: 92.1 fL (ref 80.0–100.0)
Monocytes Absolute: 0.5 10*3/uL (ref 0.1–1.0)
Monocytes Relative: 10 %
Neutro Abs: 2.7 10*3/uL (ref 1.7–7.7)
Neutrophils Relative %: 57 %
Platelets: 250 10*3/uL (ref 150–400)
RBC: 4.05 MIL/uL (ref 3.87–5.11)
RDW: 13.6 % (ref 11.5–15.5)
WBC: 4.8 10*3/uL (ref 4.0–10.5)
nRBC: 0 % (ref 0.0–0.2)

## 2022-06-18 MED ORDER — VITAMIN D 25 MCG (1000 UNIT) PO TABS: 10000.0000 [IU] | ORAL_TABLET | ORAL | Status: DC

## 2022-06-18 MED ORDER — POLYETHYLENE GLYCOL 3350 17 G PO PACK
17.0000 g | PACK | Freq: Every day | ORAL | Status: DC | PRN
  Administered 2022-06-18: 17 g via ORAL
  Filled 2022-06-18: qty 1

## 2022-06-18 MED ORDER — AMPHETAMINE-DEXTROAMPHETAMINE 10 MG PO TABS
20.0000 mg | ORAL_TABLET | Freq: Two times a day (BID) | ORAL | Status: DC | PRN
  Administered 2022-06-18: 20 mg via ORAL
  Filled 2022-06-18: qty 2

## 2022-06-18 NOTE — Discharge Summary (Signed)
Physician Discharge Summary  [name] [name] DGU:[id number] DOB: May 14, 1976 DOA: 06/15/2022  PCP: Patient, No Pcp Per  Admit date: 06/15/2022 Discharge date: 06/18/2022  Admitted From: Home Discharge disposition: Home  Recommendations at discharge:  Continue antibiotics for neck 7 days with probiotics to complete 10-day course Judicious use of pain medicines Follow-up with pain management Follow-up with orthopedics    Brief narrative:  [name] [name] is a 46 y.o. female with PMH significant for anxiety/depression, arthritis, history of disc bulging, fibromyalgia, IBS, migraine, postpartum left leg DVT. Patient was sent to ED from [facility] office today because of severity of her pain. Patient has chronic pain due to fibromyalgia, migraine and has been on and off pain medicines. Few months ago, she had an accident at the trampoline after which she underwent extensive evaluation.  No intracranial injury, cervical injury or any fracture was found.  However she is having more days with general pain and migraine since then. 5 days ago, she was climbing a tree with her kids and fell on her back.  The pain was more severe at first and she tried heat pad and ice pack but it progressively worsened to a point where she is now having difficulty standing and walking.  From yesterday, she started having burning urination as well. Today she presented at [facility].  Because of the level of severity of her pain, she was sent to ED.    In the ED, she was afebrile, hemodynamically stable with blood pressure in low normal range which is her baseline Labs with WBC count 13.7, hemoglobin, platelet normal, renal function normal Urine pregnancy test negative Urinalysis with cloudy yellow urine with large amount of leukocytes, positive nitrite and many bacteria CT abdomen pelvis showed minimal right hydroureteronephrosis, wall enhancement of the ureter suspicious for UTI without evidence of obstructing  calculus.  CT scan of lumbar spine showed an acute nondisplaced fracture of the left L3 transverse process. CT scan of thoracic spine did not any acute findings. Patient was started on IV fluid, IV antibiotics given for pyelonephritis. ED physician paged [facility] to discuss lumbar transverse process fracture Hospitalist service called for inpatient admission and management  Subjective: Patient was seen and examined this morning.   Lying on the visitors bench.  She finds it more comfortable for her back than in the bed...  Pain tolerable on oral and IV pain meds.  She had a fall in the bathroom last night.  CT head was checked which was negative for any intracranial injury. Plan is for discharge yesterday but because of the severity of her pain, patient did not feel comfortable going home yesterday.  Hospital course: Acute right pyelonephritis Urinalysis with cloudy yellow urine with large amount of leukocytes, positive nitrite and many bacteria CT imaging showed minimal right hydroureteronephrosis, wall enhancement of the ureter suspicious for UTI without evidence of obstructing calculus.  Urine culture has not shown any growth so far.  Clinically improving on IV Rocephin.  Switched her to oral cefdinir and discharge on it for 7 days to complete 10-day course.  Along with probiotics  Acute nondisplaced fracture of the left L3 transverse process No evidence of vertebral fracture in thoracic or lumbar level. Seen by Dr. Alma Friendly from [place]. ED physician also reached out to neurosurgery NP Chrystine Oiler.  MRI lumbar spine was ordered which did not see evidence of vertebral body fracture or spinal cord compression. Currently on lidocaine patch, cold compression, TLSO brace and pain medicines.  Intractable right lower  back and abdominal pain Due to combination of acute right pyelonephritis as well as L3 transverse process fracture See below for management of individual issues For pain  control, patient getting both pharmacological and nonpharmacological methods of treatment.  Continue cold compression, TLSO brace  Currently on scheduled oral Percocet, IV Robaxin and low-dose IV Dilaudid.  Reports allergy to Tylenol.  She requests requests few days of oral Dilaudid till she gets to see her pain management doctor at [facility]. At discharge, I ordered for oral Robaxin, oral Dilaudid for 7 days. Follow-up with pain management.  Chronic pain, fibromyalgia, history of disc bulging On intermittent chronic pain meds at home  History of migraine Follows with [facility] neurology. 8/21, last seen by neurology Dr. Krista Blue at [facility] neurology.  She received Botox injection every 3 months.  Also on other oral meds.  Resumed.  History of postpartum left leg DVT Patient reports he had DVT in 2014 after her delivery.  She was shortly on Lovenox and after that on oral pill for few months.  Has not had recurrence since then.  Mobility -PT eval obtained.  Outpatient follow-up recommended  Wounds:  - Incision (Closed) 07/28/14 Abdomen Left (Active)  Date First Assessed/Time First Assessed: 07/28/14 1513   Location: Abdomen  Location Orientation: Left    Assessments 07/28/2014  3:30 PM 07/29/2014  9:00 AM  Dressing Type Liquid skin adhesive Liquid skin adhesive  Site / Wound Assessment Clean;Dry Clean;Dry  Closure Skin glue Skin glue     No associated orders.     Incision - 2 Ports Abdomen 1: Left;Lateral 2: Left;Lower (Active)  Placement Date/Time: 07/28/14 1450   Location of Ports: Abdomen  Port: 1:  Location Orientation: Left;Lateral  Port: 2:  Location Orientation: Left;Lower    Assessments 07/28/2014  4:15 PM 07/29/2014  9:00 AM  Port 1 Site Assessment Clean;Dry Clean;Dry  Port 1 Dressing Type Liquid skin adhesive Liquid skin adhesive  Port 2 Site Assessment Clean;Dry Clean;Dry  Port 2 Dressing Type Liquid skin adhesive Liquid skin adhesive     No associated orders.    Discharge  Exam:   Vitals:   06/17/22 1616 06/17/22 2103 06/18/22 0704 06/18/22 1229  BP: 118/83 130/77 107/64 121/69  Pulse: 67 82 64 65  Resp: '14 15 18 20  '$ Temp: 98.1 F (36.7 C) 97.8 F (36.6 C) 98 F (36.7 C) 98.3 F (36.8 C)  TempSrc: Oral Oral Oral Oral  SpO2: 100% 98% 99% 100%  Weight:      Height:        Body mass index is 22.6 kg/m.   General exam: Pleasant, young Caucasian female.  Pain partially controlled Skin: No rashes, lesions or ulcers. HEENT: Atraumatic, normocephalic, no obvious bleeding Lungs: Clear to auscultation bilaterally CVS: Regular rate and rhythm, no murmur GI/Abd soft, nontender, nondistended, bowel sound present.  Right CVA tenderness improving.  Left low back tenderness improving CNS: Alert, awake, oriented x3. Psychiatry: Mood appropriate Extremities: No pedal edema, no calf tenderness  Follow ups:    Follow-up Information     Donato Heinz, MD Follow up.   Specialties: Cardiology, Radiology Contact information: [address] [place] [address] [facility] up.   Specialty: Rehabilitation Why: they will call you for appt. Contact information: [place] [address] [facility] Follow up.   Why:  Contact and ask to speak to someone in housing assistance. Contact information: [address] [place], [place] [address] [phone]        [facility]. Call.   Why: Call and ask how to sign up for Medicaid Transportation services for you and your family. Contact information: [address] [place], [place] [address]  [phone]                Discharge Instructions:   Discharge Instructions     Ambulatory referral to Pain Clinic   Complete by: As directed    Ambulatory referral to Physical Therapy    Complete by: As directed    Ambulatory referral to Physical Therapy   Complete by: As directed    Iontophoresis - 4 mg/ml of dexamethasone: No   T.E.N.S. Unit Evaluation and Dispense as Indicated: No   Ambulatory referral to Physical Therapy   Complete by: As directed    Iontophoresis - 4 mg/ml of dexamethasone: No   T.E.N.S. Unit Evaluation and Dispense as Indicated: No   Ambulatory referral to Spine Surgery   Complete by: As directed    Call MD for:  difficulty breathing, headache or visual disturbances   Complete by: As directed    Call MD for:  extreme fatigue   Complete by: As directed    Call MD for:  hives   Complete by: As directed    Call MD for:  persistant dizziness or light-headedness   Complete by: As directed    Call MD for:  persistant nausea and vomiting   Complete by: As directed    Call MD for:  severe uncontrolled pain   Complete by: As directed    Call MD for:  temperature >100.4   Complete by: As directed    Diet general   Complete by: As directed    Discharge instructions   Complete by: As directed    Recommendations at discharge:   Continue antibiotics for neck 7 days with probiotics to complete 10-day course  Judicious use of pain medicines  Follow-up with pain management  Follow-up with orthopedics  General discharge instructions: Follow with Primary MD Patient, No Pcp Per in 7 days  Please request your PCP  to go over your hospital tests, procedures, radiology results at the follow up. Please get your medicines reviewed and adjusted.  Your PCP may decide to repeat certain labs or tests as needed. Do not drive, operate heavy machinery, perform activities at heights, swimming or participation in water activities or provide baby sitting services if your were admitted for syncope or siezures until you have seen by Primary MD or a Neurologist and advised to do so again. [place] Controlled Substance Reporting System database was reviewed. Do not  drive, operate heavy machinery, perform activities at heights, swim, participate in water activities or provide baby-sitting services while on medications for pain, sleep and mood until your outpatient physician has reevaluated you and advised to do so again.  You are strongly recommended to comply with the dose, frequency and duration of prescribed medications. Activity: As tolerated with Full fall precautions use walker/cane & assistance as needed Avoid using any recreational substances like cigarette, tobacco, alcohol, or non-prescribed drug. If you experience worsening of your admission symptoms, develop shortness of breath, life threatening emergency, suicidal or homicidal thoughts you must seek medical attention immediately by calling 911 or calling your MD immediately  if symptoms less severe. You must read complete instructions/literature along with all  the possible adverse reactions/side effects for all the medicines you take and that have been prescribed to you. Take any new medicine only after you have completely understood and accepted all the possible adverse reactions/side effects.  Wear Seat belts while driving. You were cared for by a hospitalist during your hospital stay. If you have any questions about your discharge medications or the care you received while you were in the hospital after you are discharged, you can call the unit and ask to speak with the hospitalist or the covering physician. Once you are discharged, your primary care physician will handle any further medical issues. Please note that NO REFILLS for any discharge medications will be authorized once you are discharged, as it is imperative that you return to your primary care physician (or establish a relationship with a primary care physician if you do not have one).   Increase activity slowly   Complete by: As directed        Discharge Medications:   Allergies as of 06/18/2022       Reactions   Latex Rash    Acetaminophen Other (See Comments)   Itchy, hives, rash, stomach pain, nausea   Influenza Vaccines    Metoprolol Other (See Comments)   dizziness   Venlafaxine    Reports one time seizure on this medication (occurred 20+ years ago)   Verapamil Nausea Only   Headache, dizziness        Medication List     STOP taking these medications    BOTOX IM   Oxycodone HCl 10 MG Tabs       TAKE these medications    amphetamine-dextroamphetamine 20 MG tablet Commonly known as: ADDERALL Take 20 mg by mouth 2 (two) times daily as needed (adhd). What changed: Another medication with the same name was removed. Continue taking this medication, and follow the directions you see here.   CANNABIDIOL PO Take 2 tablets by mouth daily as needed (pain relief).   cefdinir 300 MG capsule Commonly known as: OMNICEF Take 1 capsule (300 mg total) by mouth every 12 (twelve) hours for 7 days.   HYDROmorphone 2 MG tablet Commonly known as: Dilaudid Take 1 tablet (2 mg total) by mouth every 6 (six) hours as needed for up to 7 days for severe pain.   hydrOXYzine 10 MG tablet Commonly known as: ATARAX Take 1 tablet (10 mg total) by mouth as needed. What changed:  when to take this reasons to take this   ibuprofen 800 MG tablet Commonly known as: ADVIL Take 800 mg by mouth every 8 (eight) hours as needed for moderate pain.   IRON PO Take 1 tablet by mouth daily as needed (low iron).   lidocaine 4 % Place 1 patch onto the skin daily as needed for up to 14 days (pain).   methocarbamol 750 MG tablet Commonly known as: Robaxin-750 Take 1 tablet (750 mg total) by mouth 4 (four) times daily for 14 days.   multivitamin capsule Take 1 capsule by mouth daily.   ondansetron 4 MG disintegrating tablet Commonly known as: Zofran ODT Take 1 tablet (4 mg total) by mouth every 8 (eight) hours as needed.   rizatriptan 10 MG disintegrating tablet Commonly known as: Maxalt-MLT Take 1 tablet (10 mg  total) by mouth as needed. May repeat in 2 hours if needed What changed:  when to take this reasons to take this additional instructions   SUMAtriptan 100 MG tablet Commonly known as: Imitrex Take 1 tablet (  100 mg total) by mouth every 2 (two) hours as needed for migraine. May repeat in 2 hours if headache persists or recurs.   VITAMIN D PO Take 10,000 Units by mouth every other day.               Durable Medical Equipment  (From admission, onward)           Start     Ordered   06/16/22 1603  For home use only DME Walker rolling  Once       Question Answer Comment  Walker: With 5 Inch Wheels   Patient needs a walker to treat with the following condition Difficulty in walking, not elsewhere classified      06/16/22 1602             The results of significant diagnostics from this hospitalization (including imaging, microbiology, ancillary and laboratory) are listed below for reference.    Procedures and Diagnostic Studies:   MR LUMBAR SPINE WO CONTRAST  Result Date: 06/16/2022 CLINICAL DATA:  Fall from tree 6 days ago.  Severe back pain EXAM: MRI LUMBAR SPINE WITHOUT CONTRAST TECHNIQUE: Multiplanar, multisequence MR imaging of the lumbar spine was performed. No intravenous contrast was administered. COMPARISON:  CT from earlier today FINDINGS: Segmentation:  5 lumbar type vertebrae Alignment:  Physiologic. Vertebrae: Known L3 left transverse process fracture with marrow edema. No occult vertebral body fracture. Conus medullaris and cauda equina: Conus extends to the T12-L1 level. Conus and cauda equina appear normal. Paraspinal and other soft tissues: Strain of intrinsic back muscles and psoas on the left. Cholelithiasis. Disc levels: T12-L1 disc desiccation and narrowing with shallow central protrusion. L5-S1 disc desiccation and narrowing with small left paracentral protrusion. Negative facets. No neural impingement IMPRESSION: 1. L3 left transverse process fracture  with adjacent strain. 2. Disc degeneration without impingement at T12-L1 and L5-S1. Electronically Signed   By: Jorje Guild M.D.   On: 06/16/2022 10:56   CT L-SPINE NO CHARGE  Result Date: 06/15/2022 CLINICAL DATA:  Right-sided low back pain EXAM: CT LUMBAR SPINE WITHOUT CONTRAST TECHNIQUE: Multidetector CT imaging of the lumbar spine was performed without intravenous contrast administration. Multiplanar CT image reconstructions were also generated. RADIATION DOSE REDUCTION: This exam was performed according to the departmental dose-optimization program which includes automated exposure control, adjustment of the mA and/or kV according to patient size and/or use of iterative reconstruction technique. COMPARISON:  CT 02/18/2022, MRI 02/19/2022 FINDINGS: Segmentation: 5 lumbar type vertebrae. Alignment: Normal. Vertebrae: Acute nondisplaced fracture of the left L3 transverse process (series 4, image 53). No additional fractures. No focal pathologic process. Paraspinal and other soft tissues: Prominent urothelial enhancement of the right ureter. Cholelithiasis. See dedicated same day CT of the abdomen and pelvis for full characterization of the abdominopelvic findings. Disc levels: Intervertebral disc heights are preserved. Facet joints within normal limits. IMPRESSION: 1. Acute nondisplaced fracture of the left L3 transverse process. 2. Prominent urothelial enhancement of the right ureter, which may represent ascending urinary tract infection. 3. Cholelithiasis. See dedicated same day CT of the abdomen and pelvis for full characterization of the abdominopelvic findings. Electronically Signed   By: Davina Poke D.O.   On: 06/15/2022 15:31   CT Thoracic Spine Wo Contrast  Result Date: 06/15/2022 CLINICAL DATA:  Back pain EXAM: CT THORACIC SPINE WITHOUT CONTRAST TECHNIQUE: Multidetector CT images of the thoracic were obtained using the standard protocol without intravenous contrast. RADIATION DOSE  REDUCTION: This exam was performed according to the departmental dose-optimization  program which includes automated exposure control, adjustment of the mA and/or kV according to patient size and/or use of iterative reconstruction technique. COMPARISON:  02/17/2022 FINDINGS: Alignment: Mild thoracic dextrocurvature.  No static listhesis. Vertebrae: No acute fracture. Benign sclerotic bone island within the T2 vertebral body which was low in signal on T1 and T2 weighted images on MRI from 02/17/2022. No suspicious lytic or sclerotic bone lesion. Paraspinal and other soft tissues: Negative. Disc levels: Intervertebral disc heights are preserved. Similar degree of mild right-sided facet arthropathy within the upper to midthoracic spine. IMPRESSION: 1. No acute fracture or static listhesis of the thoracic spine. 2. Similar degree of mild right-sided facet arthropathy within the upper to midthoracic spine. Electronically Signed   By: Davina Poke D.O.   On: 06/15/2022 15:25   CT Abdomen Pelvis W Contrast  Result Date: 06/15/2022 CLINICAL DATA:  Right lower quadrant abdominal pain. EXAM: CT ABDOMEN AND PELVIS WITH CONTRAST TECHNIQUE: Multidetector CT imaging of the abdomen and pelvis was performed using the standard protocol following bolus administration of intravenous contrast. RADIATION DOSE REDUCTION: This exam was performed according to the departmental dose-optimization program which includes automated exposure control, adjustment of the mA and/or kV according to patient size and/or use of iterative reconstruction technique. CONTRAST:  141m OMNIPAQUE IOHEXOL 300 MG/ML  SOLN COMPARISON:  July 28, 2014. FINDINGS: Lower chest: No acute abnormality. Hepatobiliary: Mild cholelithiasis is noted. No biliary dilatation is noted. Stable hepatic cysts. Periportal hypodensity is noted suggesting edema. Pancreas: Unremarkable. No pancreatic ductal dilatation or surrounding inflammatory changes. Spleen: Normal in size  without focal abnormality. Adrenals/Urinary Tract: Adrenal glands appear normal. Minimal right hydroureteronephrosis is noted without evidence of obstructing calculus; some wall enhancement of the ureter is noted and ascending urinary tract infection cannot be excluded. Urinary bladder is unremarkable. Stomach/Bowel: Stomach is unremarkable. Status post appendectomy. There is no evidence of bowel obstruction or inflammation. Vascular/Lymphatic: No significant vascular findings are present. No enlarged abdominal or pelvic lymph nodes. Reproductive: Uterus and bilateral adnexa are unremarkable. Other: No abdominal wall hernia or abnormality. No abdominopelvic ascites. Musculoskeletal: No acute or significant osseous findings. IMPRESSION: Periportal edema is noted in the liver of uncertain etiology. Mild cholelithiasis is noted. Minimal right hydroureteronephrosis is noted without evidence of obstructing calculus. Wall enhancement of the ureter is noted and ascending urinary tract infection cannot be excluded. Electronically Signed   By: JMarijo ConceptionM.D.   On: 06/15/2022 15:25     Labs:   Basic Metabolic Panel: Recent Labs  Lab 06/15/22 1257  NA 139  K 3.6  CL 105  CO2 26  GLUCOSE 111*  BUN 12  CREATININE 0.70  CALCIUM 9.2   GFR Estimated Creatinine Clearance: 82.3 mL/min (by C-G formula based on SCr of 0.7 mg/dL). Liver Function Tests: Recent Labs  Lab 06/15/22 1257  AST 27  ALT 23  ALKPHOS 36*  BILITOT 0.6  PROT 7.2  ALBUMIN 4.3   Recent Labs  Lab 06/15/22 1257  LIPASE 24   No results for input(s): "AMMONIA" in the last 168 hours. Coagulation profile No results for input(s): "INR", "PROTIME" in the last 168 hours.  CBC: Recent Labs  Lab 06/15/22 1257 06/17/22 1140 06/18/22 0740  WBC 13.7* 6.3 4.8  NEUTROABS 11.9*  --  2.7  HGB 12.3 11.9* 11.7*  HCT 39.1 39.0 37.3  MCV 90.9 94.2 92.1  PLT 273 267 250   Cardiac Enzymes: No results for input(s): "CKTOTAL",  "CKMB", "CKMBINDEX", "TROPONINI" in the last 168 hours.  BNP: Invalid input(s): "POCBNP" CBG: No results for input(s): "GLUCAP" in the last 168 hours. D-Dimer No results for input(s): "DDIMER" in the last 72 hours. Hgb A1c No results for input(s): "HGBA1C" in the last 72 hours. Lipid Profile No results for input(s): "CHOL", "HDL", "LDLCALC", "TRIG", "CHOLHDL", "LDLDIRECT" in the last 72 hours. Thyroid function studies No results for input(s): "TSH", "T4TOTAL", "T3FREE", "THYROIDAB" in the last 72 hours.  Invalid input(s): "FREET3" Anemia work up No results for input(s): "VITAMINB12", "FOLATE", "FERRITIN", "TIBC", "IRON", "RETICCTPCT" in the last 72 hours. Microbiology Recent Results (from the past 240 hour(s))  Urine Culture     Status: None   Collection Time: 06/16/22  4:29 PM   Specimen: Urine, Clean Catch  Result Value Ref Range Status   Specimen Description   Final    URINE, CLEAN CATCH Performed at [facility], [place] [address], [place], [place] [address]    Special Requests   Final    NONE Performed at [facility], [place] [address] [place], [place] [address]    Culture   Final    NO GROWTH Performed at [facility], [place] [address], [place], [place] [address]    Report Status 06/17/2022 FINAL  Final    Time coordinating discharge: 35 minutes  Signed: Marlow Berenguer  [facility] 06/18/2022, 1:27 PM

## 2022-06-18 NOTE — Progress Notes (Signed)
Chaplain engaged in an initial visit with [facility].  Chaplain provided education on how to complete the Advanced Directive, Healthcare POA document.  Chaplain also provided reflective listening as [name] [name] shared about the significant stress in her life.  She has found herself in a place of wanting and needing to rebuild with her three children.  She expressed the difficulties she has faced and the need for resources.  Chaplain offered some information, inspiration and hope for her journey ahead.   Chaplain offered a compassionate presence, support, and listening.     06/18/22 0900  Clinical Encounter Type  Visited With Patient  Visit Type Initial;Social support;Spiritual support  Referral From Patient  Consult/Referral To Chaplain  Spiritual Encounters  Spiritual Needs Emotional;Brochure;Literature  Stress Factors  Patient Stress Factors Financial concerns;Family relationships;Exhausted;Health changes;Major life changes

## 2022-06-18 NOTE — TOC Transition Note (Addendum)
Transition of Care [facility]) - CM/SW Discharge Note   Patient Details  Name: NASTASHIA GALLO MRN: [id number] Date of Birth: 1976-07-01  Transition of Care [employer]) CM/SW Contact:  Dessa Phi, RN Phone Number: 06/18/2022, 3:26 PM   Clinical Narrative:  Spoke to patient in rm about d/c plans-agree to [facility] HHPT/aide/social work. Patient will have family to assist w/home environment, & family.No further CM needs.   -3:30p-otpt PT services removed not appropriate.   Final next level of care: Home w Home Health Services Barriers to Discharge: No Barriers Identified   Patient Goals and CMS Choice Patient states their goals for this hospitalization and ongoing recovery are::  (Home)   Choice offered to / list presented to : Patient  Discharge Placement                       Discharge Plan and Services   Discharge Planning Services: CM Consult Post Acute Care Choice: Home Health          DME Arranged: Walker rolling DME Agency: [employer] Date DME Agency Contacted: 06/18/22 Time DME Agency Contacted: 1027 Representative spoke with at DME Agency: [place]: PT, Nurse's Aide, Social Work [employer] Agency: Microbiologist ([place]) Date [place]: 06/18/22 Time [place]: [place] Representative spoke with at [place]:  Caryl Pina)  Social Determinants of Health ([place]) Interventions     Readmission Risk Interventions     No data to display

## 2022-06-18 NOTE — Progress Notes (Addendum)
Physical Therapy Treatment Patient Details Name: [name] [name] MRN: [id number] DOB: 10/18/1976 Today's Date: 06/18/2022   History of Present Illness 46 y.o. female sent to ED from [facility] office today because of severity of her pain.admitted with UTI. MRI+ CT scan of lumbar spine showed an acute nondisplaced fracture of the left L3 transverse process. PFX:[id number], arthritis, history of disc bulging, fibromyalgia, IBS, migraine, postpartum left leg DVT.    PT Comments    Reviewed mobility as noted below, donning/doffing TLSO, back precautions and encouraged use of RW at home. Reviewed progression of activity and not  over doing. Reviewed importance of allowing time for fx to heal. Pt would benefit from HHPT for fall prevention, balance and strengthening, progression to out pt  Recommendations for follow up therapy are one component of a multi-disciplinary discharge planning process, led by the attending physician.  Recommendations may be updated based on patient status, additional functional criteria and insurance authorization.  Follow Up Recommendations  Home health PT     Assistance Recommended at Discharge Frequent or constant Supervision/Assistance  Patient can return home with the following Assistance with cooking/housework;Assist for transportation;Help with stairs or ramp for entrance;A little help with walking and/or transfers   Equipment Recommendations  Other (comment) (delivered)    Recommendations for Other Services       Precautions / Restrictions Precautions Precautions: Fall;Back Required Braces or Orthoses: Spinal Brace Spinal Brace: Thoracolumbosacral orthotic;Applied in sitting position;Applied in standing position Other Brace: educated on donning/doffing brace; for pt comfort per Dr Veverly Fells Restrictions Weight Bearing Restrictions: No Other Position/Activity Restrictions: reviewed back precautions with regard to sleeping  positions, all areas   mobility     Mobility  Bed Mobility               General bed mobility comments: reviewed importance of log roll, positioning with spine support    Transfers Overall transfer level: Needs assistance Equipment used: Rolling walker (2 wheels) Transfers: Sit to/from Stand Sit to Stand: Supervision           General transfer comment: cues for hand placement, posture, to power up and down with LEs. no physical assist    Ambulation/Gait Ambulation/Gait assistance: Min guard, Supervision Gait Distance (Feet): 300 Feet Assistive device: Rolling walker (2 wheels), None Gait Pattern/deviations: Step-through pattern, Knee flexed in stance - right, Knee flexed in stance - left, Decreased stride length, Trunk flexed, Narrow base of support, Scissoring Gait velocity: decr     General Gait Details: cues for RW position, trunk/knee/hip extension (much improved from previous session) supervision for  safety. cues to avoid scissoring. amb last ~ 26' with RW and reviewed use at home for safety. fatigued after distance however improved overall. ~ 4 standing rest breaks   Stairs             Wheelchair Mobility    Modified Rankin (Stroke Patients Only)       Balance   Sitting-balance support: Feet supported, No upper extremity supported Sitting balance-Leahy Scale: Good     Standing balance support: During functional activity, Reliant on assistive device for balance Standing balance-Leahy Scale: Fair Standing balance comment: able to amb without device with close supervision for safety; occasional scissoring requring min/guard for safety                            Cognition Arousal/Alertness: Awake/alert Behavior During Therapy: WFL for tasks assessed/performed Overall Cognitive Status: Within Functional Limits for tasks  assessed                                 General Comments: pt mobilizing I'ly without device in bathroom on PT arrival         Exercises      General Comments        Pertinent Vitals/Pain Pain Assessment Pain Assessment: Faces Faces Pain Scale: Hurts little more Pain Location: lower back, LEs with specific position Pain Descriptors / Indicators: Grimacing, Guarding Pain Intervention(s): Limited activity within patient's tolerance, Monitored during session, Premedicated before session, Repositioned    Home Living                          Prior Function            PT Goals (current goals can now be found in the care plan section) Acute Rehab PT Goals Patient Stated Goal: drive, get back to PT and doing yoga PT Goal Formulation: With patient Time For Goal Achievement: 06/29/22 Potential to Achieve Goals: Good Progress towards PT goals: Progressing toward goals    Frequency    Min 3X/week      PT Plan Current plan remains appropriate    Co-evaluation              AM-PAC PT "6 Clicks" Mobility   Outcome Measure  Help needed turning from your back to your side while in a flat bed without using bedrails?: None Help needed moving from lying on your back to sitting on the side of a flat bed without using bedrails?: None Help needed moving to and from a bed to a chair (including a wheelchair)?: A Little Help needed standing up from a chair using your arms (e.g., wheelchair or bedside chair)?: A Little Help needed to walk in hospital room?: A Little Help needed climbing 3-5 steps with a railing? : A Little 6 Click Score: 20    End of Session Equipment Utilized During Treatment: Gait belt Activity Tolerance: Patient tolerated treatment well Patient left: Other (comment) (window seat) Nurse Communication: Mobility status PT Visit Diagnosis: Other abnormalities of gait and mobility (R26.89);Difficulty in walking, not elsewhere classified (R26.2);Pain Pain - Right/Left: Left Pain - part of body:  (low back and LEs)     Time: 4034-7425 PT Time Calculation (min) (ACUTE  ONLY): 31 min  Charges:  $Gait Training: 23-37 mins                     Maurica Omura, PT  Acute Rehab Dept [facility]) [phone]  [facility] Weekend Pager [facility] only)  [phone]  06/18/2022    [facility] 06/18/2022, 4:29 PM

## 2022-06-20 ENCOUNTER — Ambulatory Visit (HOSPITAL_BASED_OUTPATIENT_CLINIC_OR_DEPARTMENT_OTHER): Payer: Medicare Other | Admitting: Physical Therapy

## 2022-06-20 ENCOUNTER — Encounter (HOSPITAL_BASED_OUTPATIENT_CLINIC_OR_DEPARTMENT_OTHER): Payer: Self-pay | Admitting: Physical Therapy

## 2022-06-20 DIAGNOSIS — S32009A Unspecified fracture of unspecified lumbar vertebra, initial encounter for closed fracture: Secondary | ICD-10-CM | POA: Diagnosis present

## 2022-06-20 DIAGNOSIS — M62830 Muscle spasm of back: Secondary | ICD-10-CM

## 2022-06-20 DIAGNOSIS — M545 Low back pain, unspecified: Secondary | ICD-10-CM

## 2022-06-20 DIAGNOSIS — M542 Cervicalgia: Secondary | ICD-10-CM | POA: Diagnosis present

## 2022-06-20 DIAGNOSIS — G8929 Other chronic pain: Secondary | ICD-10-CM | POA: Diagnosis present

## 2022-06-20 DIAGNOSIS — M546 Pain in thoracic spine: Secondary | ICD-10-CM | POA: Diagnosis present

## 2022-06-20 NOTE — Therapy (Signed)
OUTPATIENT PHYSICAL THERAPY TREATMENT  Progress Note/Recert Reporting Period 04/25/22 to 06-20-22  See note below for Objective Data and Assessment of Progress/Goals.        Patient Name: [name] [name] MRN: [id number] DOB:30-Oct-1975, 46 y.o., female Today's Date: 06/21/2022       PT End of Session - 06/20/22 1818     Visit Number 21    Number of Visits 37    Date for PT Re-Evaluation 08/15/22    Authorization Type MCR    PT Start Time 1501    PT Stop Time 9485    PT Time Calculation (min) 46 min    Activity Tolerance Patient tolerated treatment well;Patient limited by pain    Behavior During Therapy [facility] for tasks assessed/performed                 Past Medical History:  Diagnosis Date   Adenomatous colon polyp 2008   Anxiety 1999/2000   Arthritis    "jaw joints; neck" (46/11/7033)   Complication of anesthesia    "didn't wake up well/remembered stuff from during OR when I had pituitary surgery"   Constipation    Depression    hx   DVT (deep vein thrombosis) in pregnancy 09/2013   "LLE; postpartum"   Fibromyalgia    GERD (gastroesophageal reflux disease)    H/O hiatal hernia    Hematochezia    IBS (irritable bowel syndrome)    Internal hemorrhoids    Migraines    "couple times/wk" (07/28/2014)   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 Silvano Rusk   ESOPHAGOGASTRODUODENOSCOPY  09/29/2004   Dr. Kennedy Bucker   LAPAROSCOPIC APPENDECTOMY N/A 07/28/2014   Procedure: APPENDECTOMY LAPAROSCOPIC;  Surgeon: Autumn Messing III, MD;  Location: [place];  Service: General;  Laterality: N/A;   TRANSPHENOIDAL / TRANSNASAL HYPOPHYSECTOMY / RESECTION PITUITARY TUMOR  1999   resection of benign  pituitary tumor, [place]  2008   Patient Active Problem List   Diagnosis Date Noted   Pyelonephritis of right kidney 06/15/2022   Hypersomnolence 06/11/2022   Anxiety 06/11/2022   Low back pain 06/11/2022   Other fatigue 06/11/2022   Neck injury 02/18/2022   Chronic migraine w/o aura w/o status migrainosus, not intractable 03/21/2021   Protrusion of lumbar intervertebral disc 03/09/2021   Depression 03/16/2020   Ptosis of right eyelid 11/22/2017   Abnormal brain MRI 01/30/2017   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   Pain 06/21/2016   Tick bite of left lower leg 06/21/2016   Chronic fatigue 06/21/2016   History of pituitary surgery 09/26/2015   ADHD (attention deficit hyperactivity disorder) 05/04/2015   Anxiety state 03/08/2015   Upper respiratory tract infection 11/30/2014   Acute appendicitis 07/28/2014   Other malaise and fatigue 03/09/2014   Unspecified vitamin D deficiency 03/09/2014   Postpartum care following vaginal delivery (12/18) 10/08/2013   Normal labor and delivery 10/08/2013   Chronic migraine 01/20/2013   Headache, chronic migraine without aura, intractable 03/18/2012   Myalgia and myositis 03/18/2012   Lumbar spondylosis 03/18/2012   PERSONAL HISTORY OF ALLERGY TO EGGS- GI SENSITIVITY 05/04/2010   CHANGE IN BOWELS  05/02/2010   IRRITABLE BOWEL SYNDROME 12/04/2009   NONSPECIFIC ABN FINDNG RAD&OTH EXAM BILARY TRCT 11/30/2009   GERD 01/31/2009   ABDOMINAL PAIN-EPIGASTRIC 01/31/2009   PERSONAL HX COLONIC POLYPS 12/14/2008   INTERNAL HEMORRHOIDS 01/03/2007   HIATAL HERNIA 09/29/2004   PCP: None   REFERRING PROVIDER: Kary Kos, MD   REFERRING DIAG: M54.2 (ICD-10-CM) - Neck pain   THERAPY DIAG:  Cervicalgia  Chronic midline low back pain without sciatica  Muscle spasm of back    SUBJECTIVE:  "[place] out off of a branch, not really out of a tree, on vacation but didn't really think I hurt myself"  "I have a bad  kidney infection"    PERTINENT HISTORY:  See history listed   PAIN:  Are you having pain? Yes: 8-9/10  Pain location: generalized Pain description: ache Aggravating factors: being upright-gravity; standing Relieving factors: laying down   PRECAUTIONS: Other: post concussion   WEIGHT BEARING RESTRICTIONS No   FALLS:  Has patient fallen in last 6 months? No   LIVING ENVIRONMENT: Lives with:  kids   PLOF: Independent   PATIENT GOALS decrease pain, complete daily activities, get back to exercise   OBJECTIVE:    DIAGNOSTIC FINDINGS:  4/29 MRI IMPRESSION: 1. No acute fracture or other traumatic injury in the cervical spine. 2. C5-C6 mild right neural foraminal narrowing.   Acute nondisplaced fracture of the left L3 transverse process       COGNITION: Overall cognitive status: Difficulty to assess- pt reports cognitive difficulties since the accident    POSTURE:   EVAL: found in lobby in bent over posture with head in chair and ambulated with flexed posture  04/25/22: pt demo upright posture at rest, shifting side to side occasionally while sitting on table. Had to move to hooklying position about 30 min into appointment. Avoids standing fully upright from a seated position when in pain but is able to do so when cued.    PALPATION: EVAL: Significant spasm in cervical and lumbopelvic region      04/12/22:  Rt ASIS lower than Lt (and tender) in standing    CERVICAL ROM:    04/25/22: pt demo cervical ROM within functional limits but facial expressions of pain/discomfort while moving to end ranges    UUPPER EXTREMITY MMT:   MMT Right eval Left eval  Shoulder flexion      Shoulder extension      Shoulder abduction      Shoulder adduction      Shoulder extension      Shoulder internal rotation      Shoulder external rotation      Middle trapezius      Lower trapezius      Elbow flexion      Elbow extension      Wrist flexion      Wrist extension      Wrist  ulnar deviation      Wrist radial deviation      Wrist pronation      Wrist supination      Grip strength       (Blank rows = not tested)    TODAY'S TREATMENT: Pt seen for aquatic therapy today.  Water 3.25-4.8 ft in depth. Temp of water was 92.  Pt entered/exited the pool via stairs  slowly but independently with bilat rail.   Bad ragaz: for core elongation/stretching and pain relief. -manual left sided QL stretch -gentle knees to chest with kick out to full extension (in suspended  sup) Manual massage to low thoracic and lumbar paraspinals Post pelvic tilts 1/2 range as able x 5      Pt requires buoyancy for support and to offload joints with strengthening exercises. Viscosity of the water is needed for resistance of strengthening; water current perturbations provides challenge to standing balance unsupported, requiring increased core activation.     PATIENT EDUCATION:  Education details: Geophysicist/field seismologist of condition, POC, HEP Person educated: Patient Education method: Explanation Education comprehension: verbalized understanding     HOME EXERCISE PROGRAM: [id number] - from past episode Hip extensor stretch in sup Prone on ball right hip pressure point Sub-occipital passive stretch    ASSESSMENT: Pt with hospital stay after vacation episode where she fell out of tree in [place].  Pt didn't think much about her pain experienced due to it being similar to her "usual " pain.  She came home and began experiencing increased LB and abdominal pain.  Went to [facility] ortho where she passed out when they were getting an xray.  At hospital she was dx with a kidney infection as well as having a nondisplaced fracture of the left L3 transverse process. She presents today, discharged from hospital yesterday, in [facility], walking in forward flexed position at hip and knees from wc to pool.  She is to make a follow up appointment as per dc instructions, with Ortho which she will do when she gets home today. This  will be a new MD which we will attain new orders from once she sees him as needed She reports pain in Lumbopelvic area with continued complaints of right psoas area tightness. She is unable to tolerate any testing today so will get orders and re-assess as able next visit as well as complete recert. Note: Pt not wearing sun glasses today seemingly not having any issues with light sensitivity as prior. Focus of aqautic therapy today is stretching and gentle ROM anterior, posterior and lat core to decrease pain sx as well as improve standing posture.    OBJECTIVE IMPAIRMENTS Abnormal gait, decreased activity tolerance, decreased knowledge of condition, difficulty walking, decreased ROM, increased muscle spasms, impaired flexibility, impaired UE functional use, impaired vision/preception, postural dysfunction, and pain.    ACTIVITY LIMITATIONS meal prep, cleaning, laundry, personal finances, driving, shopping, and community activity.    PERSONAL FACTORS 1-2 comorbidities: see history above  are also affecting patient's functional outcome.      REHAB POTENTIAL: Good   CLINICAL DECISION MAKING: Unstable/unpredictable   EVALUATION COMPLEXITY: High     GOALS: Goals reviewed with patient? Yes   SHORT TERM GOALS: Target date: 04/02/2022    Pt will verbalize proper rest through her day to reduce migraine symptoms Baseline:  Goal status: Achieved     LONG TERM GOALS: Target date: 06/20/22   Pt will be able to drive around the local area without increase in symptoms Baseline: natural light is better, I can drive here and be ok (<10 min) Goal status: ongoing   2.  Able to return to gentle exercise such as yoga Baseline:  Goal status: ongoing   3.  Pt will be able to demo proper bending and lifting form necessary for ADLs Baseline:  Goal status: ongoing, able to perform motion but required cues to avoid compensation   4.  pt will use regular rest breaks to maintain a physical pain level  <=4/10 Baseline:  Goal status: new     PLAN: PT FREQUENCY: 2x/week   PT DURATION: 8 weeks   PLANNED INTERVENTIONS:  Therapeutic exercises, Therapeutic activity, Neuromuscular re-education, Balance training, Gait training, Patient/Family education, Joint mobilization, Aquatic Therapy, Dry Needling, Electrical stimulation, Spinal mobilization, Cryotherapy, Moist heat, Taping, Manual therapy, and Re-evaluation   PLAN FOR NEXT SESSION: continue general stability training in pool.   Stanton Kidney Tharon Aquas) Kein Carlberg MPT 06/21/22 1:23 PM

## 2022-06-26 ENCOUNTER — Encounter (HOSPITAL_BASED_OUTPATIENT_CLINIC_OR_DEPARTMENT_OTHER): Payer: Self-pay | Admitting: Physical Therapy

## 2022-06-26 ENCOUNTER — Ambulatory Visit (HOSPITAL_BASED_OUTPATIENT_CLINIC_OR_DEPARTMENT_OTHER): Payer: 59 | Attending: Neurosurgery | Admitting: Physical Therapy

## 2022-06-26 DIAGNOSIS — M542 Cervicalgia: Secondary | ICD-10-CM | POA: Diagnosis present

## 2022-06-26 DIAGNOSIS — M62830 Muscle spasm of back: Secondary | ICD-10-CM | POA: Diagnosis present

## 2022-06-26 DIAGNOSIS — G8929 Other chronic pain: Secondary | ICD-10-CM | POA: Insufficient documentation

## 2022-06-26 DIAGNOSIS — M545 Low back pain, unspecified: Secondary | ICD-10-CM | POA: Insufficient documentation

## 2022-06-26 DIAGNOSIS — M546 Pain in thoracic spine: Secondary | ICD-10-CM | POA: Insufficient documentation

## 2022-06-26 DIAGNOSIS — S32009A Unspecified fracture of unspecified lumbar vertebra, initial encounter for closed fracture: Secondary | ICD-10-CM | POA: Insufficient documentation

## 2022-06-26 NOTE — Therapy (Signed)
OUTPATIENT PHYSICAL THERAPY TREATMENT    Patient Name: [name] [name] MRN: [id number] DOB:Jun 11, 1976, 46 y.o., female Today's Date: 06/26/2022       PT End of Session - 06/26/22 1623     Visit Number 22    Number of Visits 37    Date for PT Re-Evaluation 08/15/22    Authorization Type MCR    Progress Note Due on Visit 14    PT Start Time 1449    PT Stop Time 4332    PT Time Calculation (min) 41 min    Activity Tolerance Patient tolerated treatment well;Patient limited by pain    Behavior During Therapy [facility] for tasks assessed/performed                  Past Medical History:  Diagnosis Date   Adenomatous colon polyp 2008   Anxiety 1999/2000   Arthritis    "jaw joints; neck" (95/10/8839)   Complication of anesthesia    "didn't wake up well/remembered stuff from during OR when I had pituitary surgery"   Constipation    Depression    hx   DVT (deep vein thrombosis) in pregnancy 09/2013   "LLE; postpartum"   Fibromyalgia    GERD (gastroesophageal reflux disease)    H/O hiatal hernia    Hematochezia    IBS (irritable bowel syndrome)    Internal hemorrhoids    Migraines    "couple times/wk" (07/28/2014)   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 Silvano Rusk   ESOPHAGOGASTRODUODENOSCOPY  09/29/2004   Dr. Kennedy Bucker   LAPAROSCOPIC APPENDECTOMY N/A 07/28/2014   Procedure: APPENDECTOMY LAPAROSCOPIC;  Surgeon: Autumn Messing III, MD;  Location: [facility] OR;  Service: General;  Laterality: N/A;   TRANSPHENOIDAL / TRANSNASAL HYPOPHYSECTOMY / RESECTION PITUITARY TUMOR  1999   resection of benign pituitary tumor, [place]  2008   Patient Active Problem List    Diagnosis Date Noted   Pyelonephritis of right kidney 06/15/2022   Hypersomnolence 06/11/2022   Anxiety 06/11/2022   Low back pain 06/11/2022   Other fatigue 06/11/2022   Neck injury 02/18/2022   Chronic migraine w/o aura w/o status migrainosus, not intractable 03/21/2021   Protrusion of lumbar intervertebral disc 03/09/2021   Depression 03/16/2020   Ptosis of right eyelid 11/22/2017   Abnormal brain MRI 01/30/2017   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   Pain 06/21/2016   Tick bite of left lower leg 06/21/2016   Chronic fatigue 06/21/2016   History of pituitary surgery 09/26/2015   ADHD (attention deficit hyperactivity disorder) 05/04/2015   Anxiety state 03/08/2015   Upper respiratory tract infection 11/30/2014   Acute appendicitis 07/28/2014   Other malaise and fatigue 03/09/2014   Unspecified vitamin D deficiency 03/09/2014   Postpartum care following vaginal delivery (12/18) 10/08/2013   Normal labor and delivery 10/08/2013   Chronic migraine 01/20/2013   Headache, chronic migraine without aura, intractable 03/18/2012   Myalgia and myositis 03/18/2012   Lumbar spondylosis 03/18/2012   PERSONAL HISTORY OF ALLERGY TO EGGS- GI SENSITIVITY 05/04/2010   CHANGE IN BOWELS 05/02/2010   IRRITABLE BOWEL SYNDROME 12/04/2009   NONSPECIFIC ABN FINDNG RAD&OTH  EXAM BILARY TRCT 11/30/2009   GERD 01/31/2009   ABDOMINAL PAIN-EPIGASTRIC 01/31/2009   PERSONAL HX COLONIC POLYPS 12/14/2008   INTERNAL HEMORRHOIDS 01/03/2007   HIATAL HERNIA 09/29/2004   PCP: None   REFERRING PROVIDER: Kary Kos, MD   REFERRING DIAG: M54.2 (ICD-10-CM) - Neck pain   THERAPY DIAG:  Closed fracture of transverse process of lumbar vertebra, initial encounter ([place])  Pain in thoracic spine  Muscle spasm of back    SUBJECTIVE:  "Pain was down to a 5-6 after last session for that day and maybe into next.  Weekend I locked back up.  I've been using the TENs unit and ice almost constantly"    PERTINENT  HISTORY:  See history listed   PAIN:  Are you having pain? Yes: 8-9/10  Pain location: generalized Pain description: ache Aggravating factors: being upright-gravity; standing Relieving factors: laying down   PRECAUTIONS: Other: post concussion   WEIGHT BEARING RESTRICTIONS No   FALLS:  Has patient fallen in last 6 months? No   LIVING ENVIRONMENT: Lives with:  kids   PLOF: Independent   PATIENT GOALS decrease pain, complete daily activities, get back to exercise   OBJECTIVE:    DIAGNOSTIC FINDINGS:  4/29 MRI IMPRESSION: 1. No acute fracture or other traumatic injury in the cervical spine. 2. C5-C6 mild right neural foraminal narrowing.   8/25 Acute nondisplaced fracture of the left L3 transverse process       COGNITION: Overall cognitive status: Difficulty to assess- pt reports cognitive difficulties since the accident    POSTURE:   EVAL: found in lobby in bent over posture with head in chair and ambulated with flexed posture  04/25/22: pt demo upright posture at rest, shifting side to side occasionally while sitting on table. Had to move to hooklying position about 30 min into appointment. Avoids standing fully upright from a seated position when in pain but is able to do so when cued.  9/5: forward flex position with r hand supporting on right knee   PALPATION: EVAL: Significant spasm in cervical and lumbopelvic region      04/12/22:  Rt ASIS lower than Lt (and tender) in standing  06/26/22: Significant spasm left paraspinals cervical spine through lumbar with TTP   CERVICAL ROM:    04/25/22: pt demo cervical ROM within functional limits but facial expressions of pain/discomfort while moving to end ranges 06/26/22: Limited slightly in right rotation due to pain    UUPPER EXTREMITY MMT:   MMT Right eval Left eval  Shoulder flexion      Shoulder extension      Shoulder abduction      Shoulder adduction      Shoulder extension      Shoulder internal rotation       Shoulder external rotation      Middle trapezius      Lower trapezius      Elbow flexion      Elbow extension      Wrist flexion      Wrist extension      Wrist ulnar deviation      Wrist radial deviation      Wrist pronation      Wrist supination      Grip strength       (Blank rows = not tested)  Lumbar spine  Flex: wfl  Ext: neutral  Side bending L: 25% limited by pain  Side bending R:25% limited by pain   Rotation: wfl  TODAY'S TREATMENT: Pt seen for aquatic therapy today.  Water 3.25-4.8 ft in depth. Temp of water was 92.  Pt entered/exited the pool via stairs  slowly but independently with bilat rail.   Bad ragaz: for core elongation/stretching and pain relief with lateral snaking  -manual left sided QL stretch -gentle knees to chest with kick out to full extension (in suspended sup) -LB stretch active assisted therapist giving gentle over pressure as tolerated -Manual massage to low thoracic and lumbar paraspinals -Post pelvic tilts full range x10: hip extension full range x 10 resisted by ankle foam cuffs      Pt requires buoyancy for support and to offload joints with strengthening exercises. Viscosity of the water is needed for resistance of strengthening; water current perturbations provides challenge to standing balance unsupported, requiring increased core activation.     PATIENT EDUCATION:  Education details: Geophysicist/field seismologist of condition, POC, HEP Person educated: Patient Education method: Explanation Education comprehension: verbalized understanding     HOME EXERCISE PROGRAM: [id number] - from past episode Hip extensor stretch in sup Prone on ball right hip pressure point Sub-occipital passive stretch    ASSESSMENT: Improved toleration to PPT.  She was able to tolerate amb 500 ft to and from pool from parking lot although in a forward flexed position (used wc transport last session). She has not yet gotten an appointment to see otrthopod.  Manual stretching and bad ragaz elongation and muscle engagement improved pain sx last session. Increased reps of post core ex in suspended supine tolerates very well.  She has had an improvement in cervical ROM and pain from original injury with just slight discomfort with side bending gaining almost full ROM.  Her gait is deviated significantly walking with LB support of rue on right thigh with each step.  She is able to stand erect supported but complains of low thoracic and lumbar discomfort completing without support. She does have a TLS brace she wears throughout day as needed which she states make back feel better.  She continues to be unable to tolerate objective testing (TUG, Berg, STS).  Will complete going forward.  Will add new LTG at next assessment. She will continue to benefit from skilled PT aquatic and land based to improve functional mobility, posture and strength and meet all goals.    Pt with hospital stay after vacation episode where she fell out of tree in [place].  Pt didn't think much about her pain experienced due to it being similar to her "usual " pain.  She came home and began experiencing increased LB and abdominal pain.  Went to [facility] ortho where she passed out when they were getting an xray.  At hospital she was dx with a kidney infection as well as having a nondisplaced fracture of the left L3 transverse process. She presents today, discharged from hospital yesterday, in [facility], walking in forward flexed position at hip and knees from wc to pool.  She is to make a follow up appointment as per dc instructions, with Ortho which she will do when she gets home today. This will be a new MD which we will attain new orders from once she sees him as needed She reports pain in Lumbopelvic area with continued complaints of right psoas area tightness. She is unable to tolerate any testing today so will get orders and re-assess as able next visit as well as complete recert. Note: Pt not wearing sun  glasses today seemingly not having any issues with light sensitivity as  prior. Focus of aqautic therapy today is stretching and gentle ROM anterior, posterior and lat core to decrease pain sx as well as improve standing posture.    OBJECTIVE IMPAIRMENTS Abnormal gait, decreased activity tolerance, decreased knowledge of condition, difficulty walking, decreased ROM, increased muscle spasms, impaired flexibility, impaired UE functional use, impaired vision/preception, postural dysfunction, and pain.    ACTIVITY LIMITATIONS meal prep, cleaning, laundry, personal finances, driving, shopping, and community activity.    PERSONAL FACTORS 1-2 comorbidities: see history above  are also affecting patient's functional outcome.      REHAB POTENTIAL: Good   CLINICAL DECISION MAKING: Unstable/unpredictable   EVALUATION COMPLEXITY: High     GOALS: Goals reviewed with patient? Yes   SHORT TERM GOALS: Target date: 04/02/2022    Pt will verbalize proper rest through her day to reduce migraine symptoms Baseline:  Goal status: Achieved     LONG TERM GOALS: Target date: 08/15/22   Pt will be able to drive around the local area without increase in symptoms Baseline: natural light is better, I can drive here and be ok (<10 min) Goal status: ongoing   2.  Able to return to gentle exercise such as yoga Baseline:  Goal status: ongoing   3.  Pt will be able to demo proper bending and lifting form necessary for ADLs Baseline:  Goal status: ongoing, able to perform motion but required cues to avoid compensation   4.  pt will use regular rest breaks to maintain a physical pain level <=4/10 Baseline:  Goal status: new     PLAN: PT FREQUENCY: 2x/week   PT DURATION: 8 weeks   PLANNED INTERVENTIONS: Therapeutic exercises, Therapeutic activity, Neuromuscular re-education, Balance training, Gait training, Patient/Family education, Joint mobilization, Aquatic Therapy, Dry Needling, Electrical  stimulation, Spinal mobilization, Cryotherapy, Moist heat, Taping, Manual therapy, and Re-evaluation   PLAN FOR NEXT SESSION: continue general stability training in pool.   Stanton Kidney Tharon Aquas) Asani Mcburney MPT 06/26/22 4:28 PM

## 2022-06-27 ENCOUNTER — Ambulatory Visit (HOSPITAL_BASED_OUTPATIENT_CLINIC_OR_DEPARTMENT_OTHER): Payer: 59 | Admitting: Physical Therapy

## 2022-06-27 ENCOUNTER — Telehealth (HOSPITAL_BASED_OUTPATIENT_CLINIC_OR_DEPARTMENT_OTHER): Payer: Self-pay | Admitting: Physical Therapy

## 2022-06-27 NOTE — Telephone Encounter (Signed)
Called and spoke with pt.  She reports her ride did not show up and that she is flat on her back on an ice pack.  Having a difficult time moving due to pain.  Pt encouraged to call md office and press them for a sooner appointment.

## 2022-07-02 IMAGING — MR MR LUMBAR SPINE W/O CM
4 of 5 series · 19 of 48 positions shown · non-contrast
Comparison: 04/06/2021

CLINICAL DATA: Trauma. Lumbar radiculopathy. Left lower extremity
weakness

EXAM:
MRI LUMBAR SPINE WITHOUT CONTRAST
TECHNIQUE: Multiplanar, multisequence MR imaging of the lumbar spine was
performed. No intravenous contrast was administered.

[Series 2: T2 · sagittal · 4.0mm · 0.55mm/px · 6 of 16 slices shown (1 of 2)]
[im 1/16]
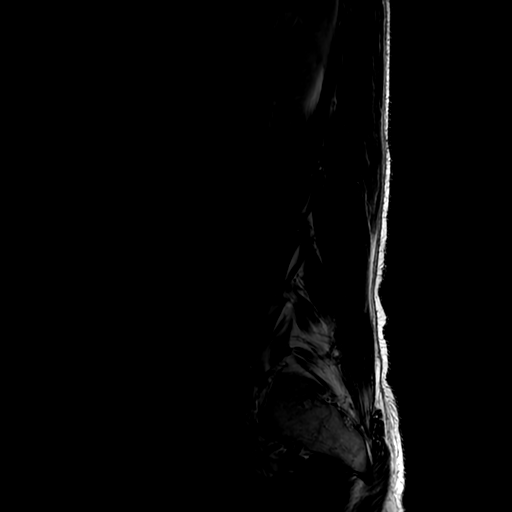
[im 4/16]
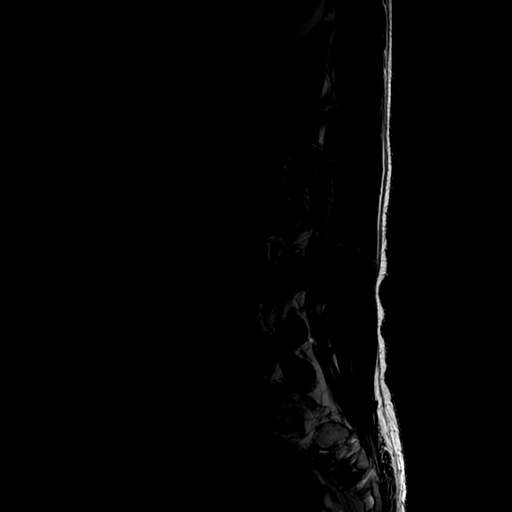
[im 7/16]
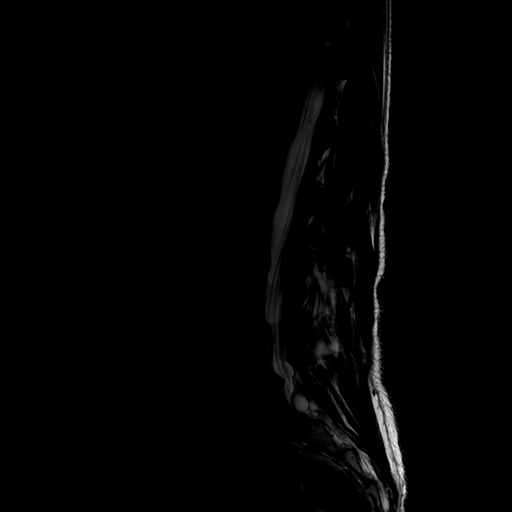
[im 10/16]
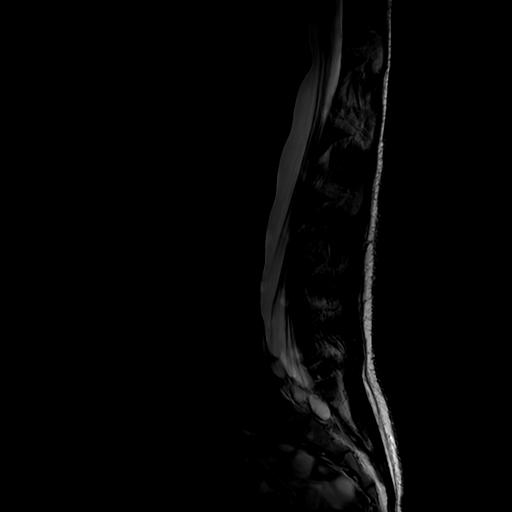
[im 13/16]
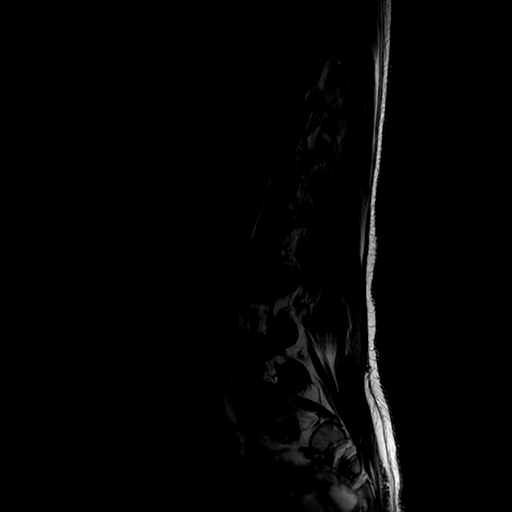
[im 16/16]
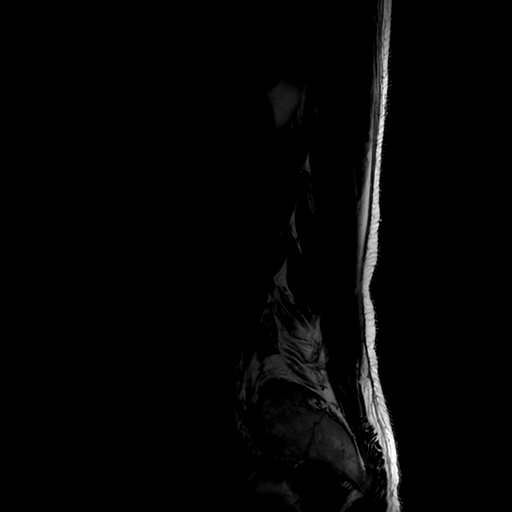

[Series 4: T1 · sagittal · 4.0mm · 0.55mm/px · 3 of 16 slices shown (1 of 2)]
[im 4/16]
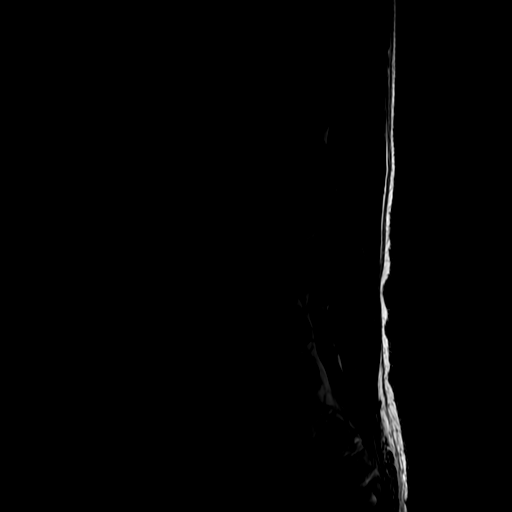
[im 10/16]
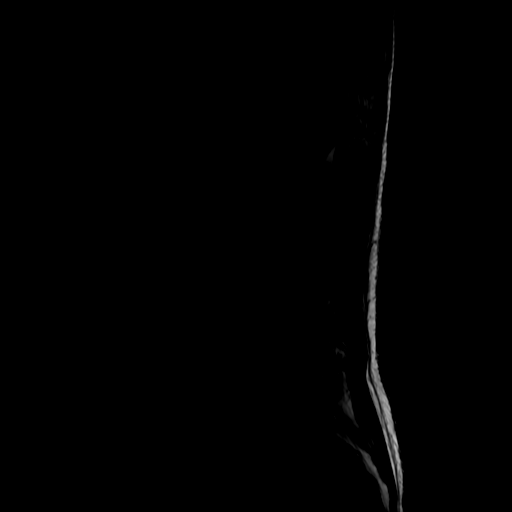
[im 16/16]
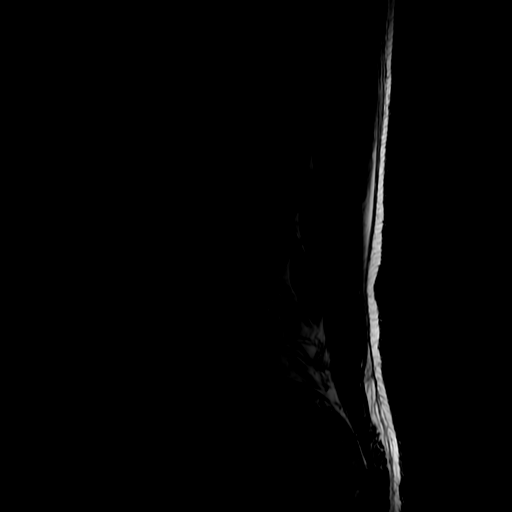

[Series 5: T2 · axial · 4.0mm · 0.39mm/px · z∈[-115,+88]mm · 7 of 43 slices shown (2 of 2)]
[im 1/43]
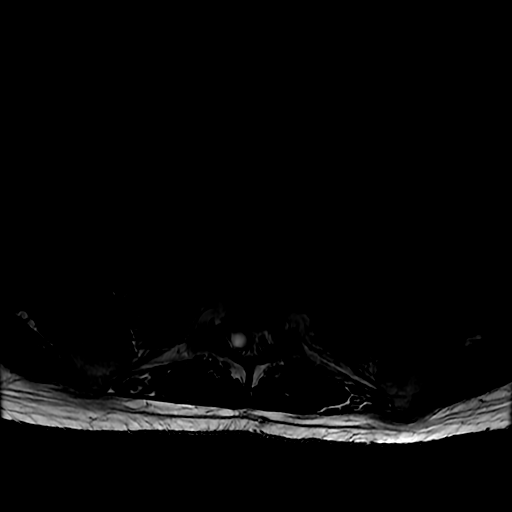
[im 7/43]
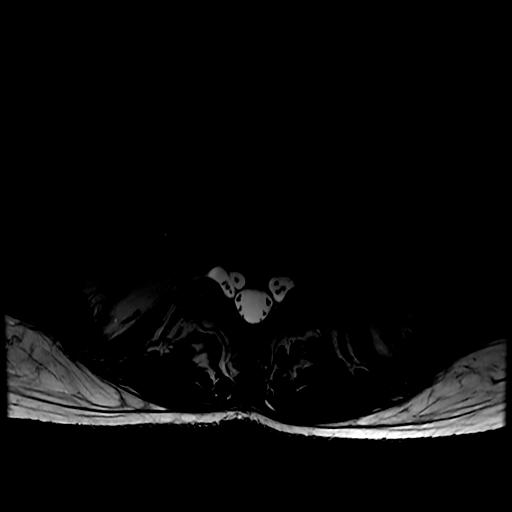
[im 13/43]
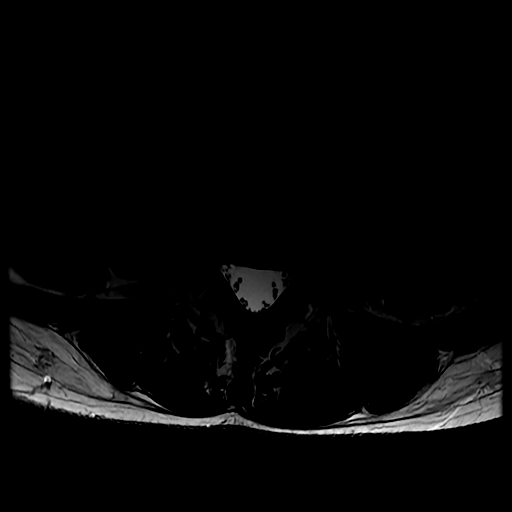
[im 19/43]
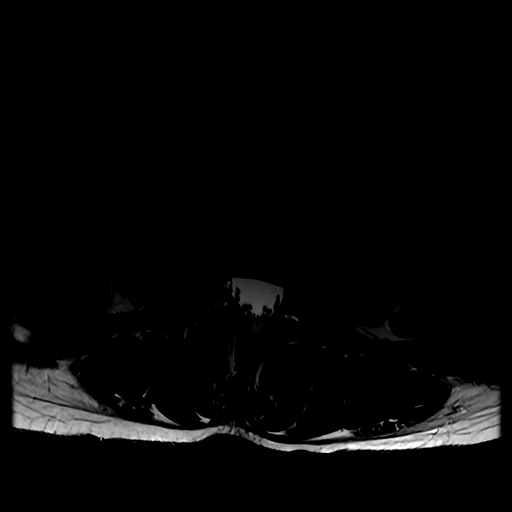
[im 22/43]
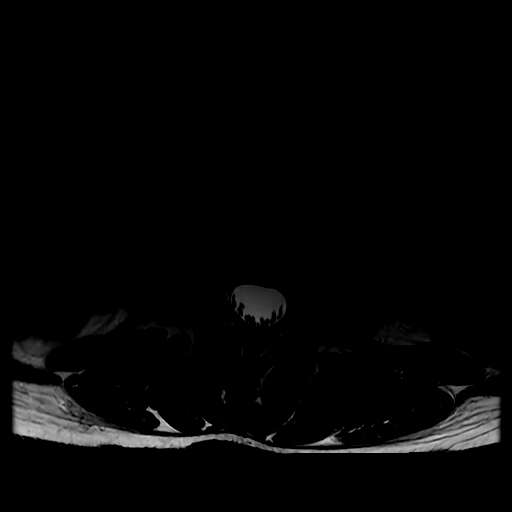
[im 25/43]
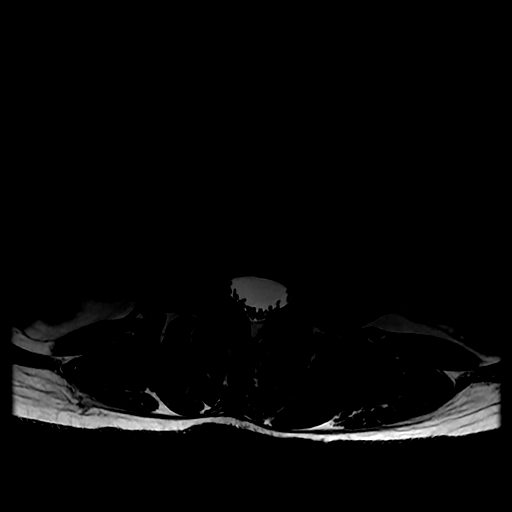
[im 37/43]
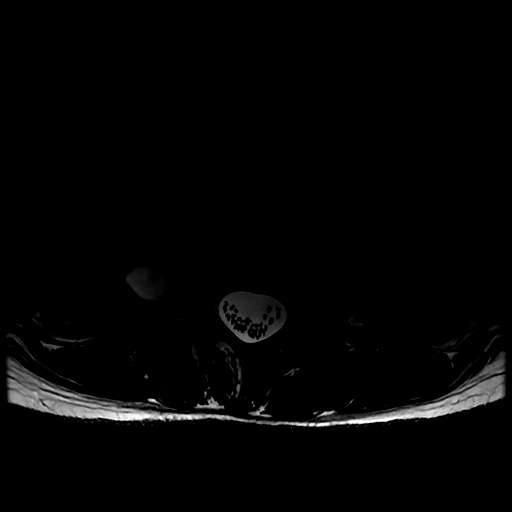

[Series 6: T1 · axial · 4.0mm · 0.39mm/px · z∈[-85,+88]mm · 3 of 43 slices shown (2 of 2)]
[im 7/43]
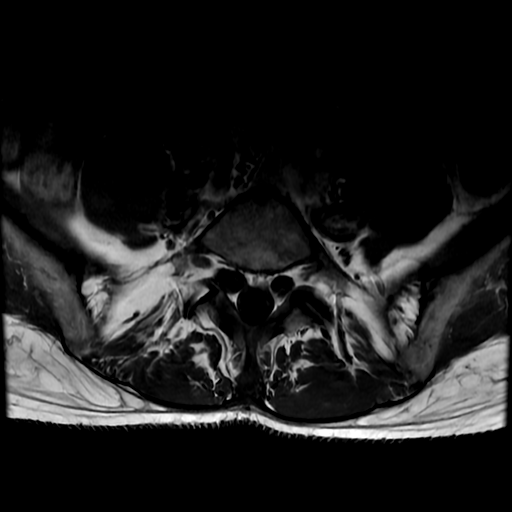
[im 22/43]
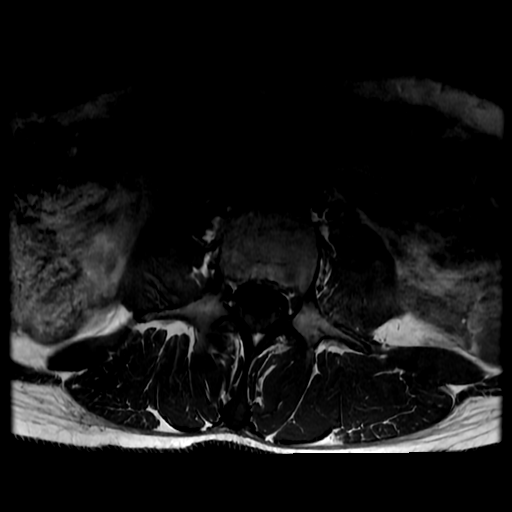
[im 37/43]
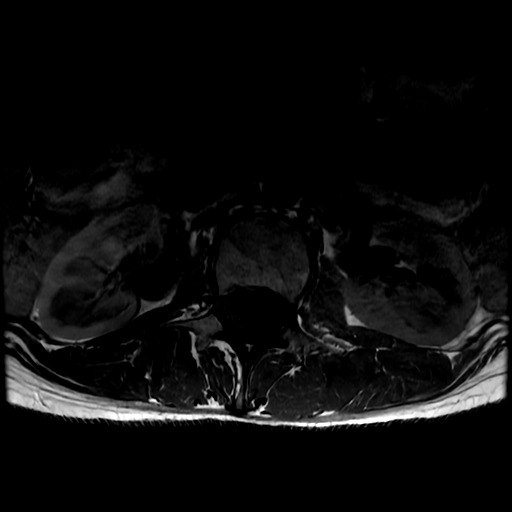

[19 of 48 positions shown; findings below may reference images not displayed]

FINDINGS: Segmentation:  5 lumbar type vertebrae

Alignment:  Physiologic.

Vertebrae:  No fracture, evidence of discitis, or bone lesion.

Conus medullaris and cauda equina: Conus extends to the T12-L1
level. Conus and cauda equina appear normal. Small sacral Tarlov
cysts also seen on prior

Paraspinal and other soft tissues: Small calculi or sludge layering
in the gallbladder, also seen and reported on prior.

Disc levels:

T12- L1: Mild disc desiccation with small protrusion and buttressing
osteophyte. No impingement

L5-S1:Mild disc desiccation and narrowing with chronic,
noncompressive bilateral paracentral protrusion. Negative facets.
IMPRESSION: 1. No acute finding.
2. Noncompressive degenerative change that is not progressed from
March 2021.

## 2022-07-03 ENCOUNTER — Encounter (HOSPITAL_BASED_OUTPATIENT_CLINIC_OR_DEPARTMENT_OTHER): Payer: Self-pay | Admitting: Physical Therapy

## 2022-07-03 ENCOUNTER — Ambulatory Visit (HOSPITAL_BASED_OUTPATIENT_CLINIC_OR_DEPARTMENT_OTHER): Payer: 59 | Admitting: Physical Therapy

## 2022-07-03 DIAGNOSIS — S32009A Unspecified fracture of unspecified lumbar vertebra, initial encounter for closed fracture: Secondary | ICD-10-CM | POA: Diagnosis not present

## 2022-07-03 DIAGNOSIS — M62830 Muscle spasm of back: Secondary | ICD-10-CM

## 2022-07-03 DIAGNOSIS — M545 Low back pain, unspecified: Secondary | ICD-10-CM

## 2022-07-03 DIAGNOSIS — M542 Cervicalgia: Secondary | ICD-10-CM

## 2022-07-03 DIAGNOSIS — M546 Pain in thoracic spine: Secondary | ICD-10-CM

## 2022-07-03 NOTE — Therapy (Signed)
OUTPATIENT PHYSICAL THERAPY TREATMENT    Patient Name: [name] [name] MRN: [id number] DOB:04/06/1976, 46 y.o., female Today's Date: 07/03/2022       PT End of Session - 07/03/22 1457     Visit Number 23    Number of Visits 37    Date for PT Re-Evaluation 08/15/22    Authorization Type MCR    PT Start Time 1448    PT Stop Time 1528    PT Time Calculation (min) 40 min    Activity Tolerance Patient tolerated treatment well;Patient limited by pain    Behavior During Therapy [facility] for tasks assessed/performed                  Past Medical History:  Diagnosis Date   Adenomatous colon polyp 2008   Anxiety 1999/2000   Arthritis    "jaw joints; neck" (71/11/4578)   Complication of anesthesia    "didn't wake up well/remembered stuff from during OR when I had pituitary surgery"   Constipation    Depression    hx   DVT (deep vein thrombosis) in pregnancy 09/2013   "LLE; postpartum"   Fibromyalgia    GERD (gastroesophageal reflux disease)    H/O hiatal hernia    Hematochezia    IBS (irritable bowel syndrome)    Internal hemorrhoids    Migraines    "couple times/wk" (07/28/2014)   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 Silvano Rusk   ESOPHAGOGASTRODUODENOSCOPY  09/29/2004   Dr. Kennedy Bucker   LAPAROSCOPIC APPENDECTOMY N/A 07/28/2014   Procedure: APPENDECTOMY LAPAROSCOPIC;  Surgeon: Autumn Messing III, MD;  Location: [facility] OR;  Service: General;  Laterality: N/A;   TRANSPHENOIDAL / TRANSNASAL HYPOPHYSECTOMY / RESECTION PITUITARY TUMOR  1999   resection of benign pituitary tumor, [place]  2008   Patient Active Problem List   Diagnosis Date Noted    Pyelonephritis of right kidney 06/15/2022   Hypersomnolence 06/11/2022   Anxiety 06/11/2022   Low back pain 06/11/2022   Other fatigue 06/11/2022   Neck injury 02/18/2022   Chronic migraine w/o aura w/o status migrainosus, not intractable 03/21/2021   Protrusion of lumbar intervertebral disc 03/09/2021   Depression 03/16/2020   Ptosis of right eyelid 11/22/2017   Abnormal brain MRI 01/30/2017   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   Pain 06/21/2016   Tick bite of left lower leg 06/21/2016   Chronic fatigue 06/21/2016   History of pituitary surgery 09/26/2015   ADHD (attention deficit hyperactivity disorder) 05/04/2015   Anxiety state 03/08/2015   Upper respiratory tract infection 11/30/2014   Acute appendicitis 07/28/2014   Other malaise and fatigue 03/09/2014   Unspecified vitamin D deficiency 03/09/2014   Postpartum care following vaginal delivery (12/18) 10/08/2013   Normal labor and delivery 10/08/2013   Chronic migraine 01/20/2013   Headache, chronic migraine without aura, intractable 03/18/2012   Myalgia and myositis 03/18/2012   Lumbar spondylosis 03/18/2012   PERSONAL HISTORY OF ALLERGY TO EGGS- GI SENSITIVITY 05/04/2010   CHANGE IN BOWELS 05/02/2010   IRRITABLE BOWEL SYNDROME 12/04/2009   NONSPECIFIC ABN FINDNG RAD&OTH EXAM BILARY TRCT 11/30/2009   GERD 01/31/2009  ABDOMINAL PAIN-EPIGASTRIC 01/31/2009   PERSONAL HX COLONIC POLYPS 12/14/2008   INTERNAL HEMORRHOIDS 01/03/2007   HIATAL HERNIA 09/29/2004   PCP: None   REFERRING PROVIDER: Kary Kos, MD   REFERRING DIAG: M54.2 (ICD-10-CM) - Neck pain   THERAPY DIAG:  Closed fracture of transverse process of lumbar vertebra, initial encounter ([place])  Pain in thoracic spine  Muscle spasm of back  Cervicalgia  Chronic midline low back pain without sciatica    SUBJECTIVE:  Pt reports she has a follow up with Ortho Dr in 2 days.  She states she has been some hours outside of back brace; she reports some concerns  of her back getting weaker.   She has brace donned upon arrival.   She states she has been icing her back "constantly" for relief.     PERTINENT HISTORY:  See history listed   PAIN:  Are you having pain? Yes: 7-9/10  Pain location: generalized Pain description: ache Aggravating factors: being upright-gravity; standing Relieving factors: laying down   PRECAUTIONS: Other: post concussion;  TLSO to wear during waking hours   WEIGHT BEARING RESTRICTIONS No   FALLS:  Has patient fallen in last 6 months? No   LIVING ENVIRONMENT: Lives with:  kids   PLOF: Independent   PATIENT GOALS decrease pain, complete daily activities, get back to exercise   OBJECTIVE:    DIAGNOSTIC FINDINGS:  4/29 MRI IMPRESSION: 1. No acute fracture or other traumatic injury in the cervical spine. 2. C5-C6 mild right neural foraminal narrowing.   8/25 Acute nondisplaced fracture of the left L3 transverse process       COGNITION: Overall cognitive status: Difficulty to assess- pt reports cognitive difficulties since the accident    POSTURE:   EVAL: found in lobby in bent over posture with head in chair and ambulated with flexed posture  04/25/22: pt demo upright posture at rest, shifting side to side occasionally while sitting on table. Had to move to hooklying position about 30 min into appointment. Avoids standing fully upright from a seated position when in pain but is able to do so when cued.  9/5: forward flex position with r hand supporting on right knee   PALPATION: EVAL: Significant spasm in cervical and lumbopelvic region      04/12/22:  Rt ASIS lower than Lt (and tender) in standing  06/26/22: Significant spasm left paraspinals cervical spine through lumbar with TTP   CERVICAL ROM:    04/25/22: pt demo cervical ROM within functional limits but facial expressions of pain/discomfort while moving to end ranges 06/26/22: Limited slightly in right rotation due to pain    UUPPER EXTREMITY  MMT:   MMT Right eval Left eval  Shoulder flexion      Shoulder extension      Shoulder abduction      Shoulder adduction      Shoulder extension      Shoulder internal rotation      Shoulder external rotation      Middle trapezius      Lower trapezius      Elbow flexion      Elbow extension      Wrist flexion      Wrist extension      Wrist ulnar deviation      Wrist radial deviation      Wrist pronation      Wrist supination      Grip strength       (Blank rows = not tested)  Lumbar  spine  Flex: wfl  Ext: neutral  Side bending L: 25% limited by pain  Side bending R:25% limited by pain   Rotation: wfl         TODAY'S TREATMENT: Pt seen for aquatic therapy today.  Water 3.25-4.8 ft in depth. Temp of water was 92.  Pt entered/exited the pool via stairs  slowly but independently with bilat rail.   - In 4 ft 8" water: forward/ backward walking - at bench: plank on forearms with slow mountain climbers; attempts hip ext - able to reach neutral only - split squat lunge with mini pulses for hip flexor stretch, with cues for upright posture - UE on yellow noodle - back against wall, using thin square noodle to stretch LE - ITB, hamstring and adductor - supported supine (noodle under knees, aqua jogger at waist, noodle/nekdoodle at neck:   Bad ragaz: for core elongation/stretching and pain relief with lateral snaking  -gentle knees to chest with kick out to full extension (in suspended sup) with arms abdct/ add    Pt requires buoyancy for support and to offload joints with strengthening exercises. Viscosity of the water is needed for resistance of strengthening; water current perturbations provides challenge to standing balance unsupported, requiring increased core activation.  Once dry: reg Rock tape applied to Lt QL with 25 % stretch with back in upright position to assist with postural feedback and decompressing tissue.  PATIENT EDUCATION:  Education details: Geophysicist/field seismologist of  condition, POC, HEP Person educated: Patient Education method: Explanation Education comprehension: verbalized understanding     HOME EXERCISE PROGRAM: [id number] - from past episode Hip extensor stretch in sup Prone on ball right hip pressure point Sub-occipital passive stretch    ASSESSMENT:   Continued limited tolerance to exercises; reports most relief with passive stretches while suspended on / in water. She continues to be unable to tolerate objective testing (TUG, Berg, STS).  Will complete going forward.  Will add new LTG at next assessment. She will continue to benefit from skilled PT aquatic and land based to improve functional mobility, posture and strength and meet all goals.    Pt with hospital stay after vacation episode where she fell out of tree in [place].  Pt didn't think much about her pain experienced due to it being similar to her "usual " pain.  She came home and began experiencing increased LB and abdominal pain.  Went to [facility] ortho where she passed out when they were getting an xray.  At hospital she was dx with a kidney infection as well as having a nondisplaced fracture of the left L3 transverse process. She presents today, discharged from hospital yesterday, in [facility], walking in forward flexed position at hip and knees from wc to pool.  She is to make a follow up appointment as per dc instructions, with Ortho which she will do when she gets home today. This will be a new MD which we will attain new orders from once she sees him as needed She reports pain in Lumbopelvic area with continued complaints of right psoas area tightness. She is unable to tolerate any testing today so will get orders and re-assess as able next visit as well as complete recert. Note: Pt not wearing sun glasses today seemingly not having any issues with light sensitivity as prior. Focus of aqautic therapy today is stretching and gentle ROM anterior, posterior and lat core to decrease pain sx as well as  improve standing posture.    OBJECTIVE IMPAIRMENTS  Abnormal gait, decreased activity tolerance, decreased knowledge of condition, difficulty walking, decreased ROM, increased muscle spasms, impaired flexibility, impaired UE functional use, impaired vision/preception, postural dysfunction, and pain.    ACTIVITY LIMITATIONS meal prep, cleaning, laundry, personal finances, driving, shopping, and community activity.    PERSONAL FACTORS 1-2 comorbidities: see history above  are also affecting patient's functional outcome.      REHAB POTENTIAL: Good   CLINICAL DECISION MAKING: Unstable/unpredictable   EVALUATION COMPLEXITY: High     GOALS: Goals reviewed with patient? Yes   SHORT TERM GOALS: Target date: 04/02/2022    Pt will verbalize proper rest through her day to reduce migraine symptoms Baseline:  Goal status: Achieved     LONG TERM GOALS: Target date: 08/15/22   Pt will be able to drive around the local area without increase in symptoms Baseline: natural light is better, I can drive here and be ok (<10 min) Goal status: ongoing   2.  Able to return to gentle exercise such as yoga Baseline:  Goal status: ongoing   3.  Pt will be able to demo proper bending and lifting form necessary for ADLs Baseline:  Goal status: ongoing, able to perform motion but required cues to avoid compensation   4.  pt will use regular rest breaks to maintain a physical pain level <=4/10 Baseline:  Goal status: new     PLAN: PT FREQUENCY: 2x/week   PT DURATION: 8 weeks   PLANNED INTERVENTIONS: Therapeutic exercises, Therapeutic activity, Neuromuscular re-education, Balance training, Gait training, Patient/Family education, Joint mobilization, Aquatic Therapy, Dry Needling, Electrical stimulation, Spinal mobilization, Cryotherapy, Moist heat, Taping, Manual therapy, and Re-evaluation   PLAN FOR NEXT SESSION: continue general stability training in pool.   Any new info from upcoming Dr  appt?  Kerin Perna, PTA 07/03/22 6:22 PM [facility] [address] [place], [place], [address] Phone: [phone]   Fax:  [phone]

## 2022-07-04 ENCOUNTER — Ambulatory Visit (HOSPITAL_BASED_OUTPATIENT_CLINIC_OR_DEPARTMENT_OTHER): Payer: Medicare Other | Admitting: Physical Therapy

## 2022-07-06 ENCOUNTER — Ambulatory Visit (HOSPITAL_BASED_OUTPATIENT_CLINIC_OR_DEPARTMENT_OTHER): Payer: 59 | Admitting: Physical Therapy

## 2022-07-06 ENCOUNTER — Encounter (HOSPITAL_BASED_OUTPATIENT_CLINIC_OR_DEPARTMENT_OTHER): Payer: Self-pay | Admitting: Physical Therapy

## 2022-07-06 DIAGNOSIS — M62830 Muscle spasm of back: Secondary | ICD-10-CM

## 2022-07-06 DIAGNOSIS — M542 Cervicalgia: Secondary | ICD-10-CM

## 2022-07-06 DIAGNOSIS — S32009A Unspecified fracture of unspecified lumbar vertebra, initial encounter for closed fracture: Secondary | ICD-10-CM | POA: Diagnosis not present

## 2022-07-06 DIAGNOSIS — M546 Pain in thoracic spine: Secondary | ICD-10-CM

## 2022-07-06 NOTE — Therapy (Signed)
OUTPATIENT PHYSICAL THERAPY TREATMENT    Patient Name: [name] [name] MRN: [id number] DOB:22-Nov-1975, 46 y.o., female Today's Date: 07/06/2022       PT End of Session - 07/06/22 0919     Visit Number 24    Number of Visits 37    Date for PT Re-Evaluation 08/15/22    Authorization Type MCR    PT Start Time 0908    PT Stop Time 0949    PT Time Calculation (min) 41 min    Activity Tolerance Patient tolerated treatment well;Patient limited by pain    Behavior During Therapy [facility] for tasks assessed/performed                   Past Medical History:  Diagnosis Date   Adenomatous colon polyp 2008   Anxiety 1999/2000   Arthritis    "jaw joints; neck" (18/05/4165)   Complication of anesthesia    "didn't wake up well/remembered stuff from during OR when I had pituitary surgery"   Constipation    Depression    hx   DVT (deep vein thrombosis) in pregnancy 09/2013   "LLE; postpartum"   Fibromyalgia    GERD (gastroesophageal reflux disease)    H/O hiatal hernia    Hematochezia    IBS (irritable bowel syndrome)    Internal hemorrhoids    Migraines    "couple times/wk" (07/28/2014)   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 Silvano Rusk   ESOPHAGOGASTRODUODENOSCOPY  09/29/2004   Dr. Kennedy Bucker   LAPAROSCOPIC APPENDECTOMY N/A 07/28/2014   Procedure: APPENDECTOMY LAPAROSCOPIC;  Surgeon: Autumn Messing III, MD;  Location: [facility];  Service: General;  Laterality: N/A;   TRANSPHENOIDAL / TRANSNASAL HYPOPHYSECTOMY / RESECTION PITUITARY TUMOR  1999   resection of benign pituitary tumor, [place]  2008   Patient Active Problem List   Diagnosis Date Noted    Pyelonephritis of right kidney 06/15/2022   Hypersomnolence 06/11/2022   Anxiety 06/11/2022   Low back pain 06/11/2022   Other fatigue 06/11/2022   Neck injury 02/18/2022   Chronic migraine w/o aura w/o status migrainosus, not intractable 03/21/2021   Protrusion of lumbar intervertebral disc 03/09/2021   Depression 03/16/2020   Ptosis of right eyelid 11/22/2017   Abnormal brain MRI 01/30/2017   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   Pain 06/21/2016   Tick bite of left lower leg 06/21/2016   Chronic fatigue 06/21/2016   History of pituitary surgery 09/26/2015   ADHD (attention deficit hyperactivity disorder) 05/04/2015   Anxiety state 03/08/2015   Upper respiratory tract infection 11/30/2014   Acute appendicitis 07/28/2014   Other malaise and fatigue 03/09/2014   Unspecified vitamin D deficiency 03/09/2014   Postpartum care following vaginal delivery (12/18) 10/08/2013   Normal labor and delivery 10/08/2013   Chronic migraine 01/20/2013   Headache, chronic migraine without aura, intractable 03/18/2012   Myalgia and myositis 03/18/2012   Lumbar spondylosis 03/18/2012   PERSONAL HISTORY OF ALLERGY TO EGGS- GI SENSITIVITY 05/04/2010   CHANGE IN BOWELS 05/02/2010   IRRITABLE BOWEL SYNDROME 12/04/2009   NONSPECIFIC ABN FINDNG RAD&OTH EXAM BILARY TRCT 11/30/2009   GERD 01/31/2009  ABDOMINAL PAIN-EPIGASTRIC 01/31/2009   PERSONAL HX COLONIC POLYPS 12/14/2008   INTERNAL HEMORRHOIDS 01/03/2007   HIATAL HERNIA 09/29/2004   PCP: None   REFERRING PROVIDER: Kary Kos, MD   REFERRING DIAG: M54.2 (ICD-10-CM) - Neck pain   THERAPY DIAG:  Closed fracture of transverse process of lumbar vertebra, initial encounter ([place])  Pain in thoracic spine  Muscle spasm of back  Cervicalgia    SUBJECTIVE:  Saw Dr Rolena Infante, no surgery needed.  Feeling better.  Not wear TLSO anymore. Back better now I can feel my neck pain again.  Had to crawl through window at home to get my keys.   PERTINENT  HISTORY:  See history listed   PAIN:  Are you having pain? Yes: 7/10  Pain location: generalized Pain description: ache Aggravating factors: being upright-gravity; standing Relieving factors: laying down   PRECAUTIONS: Other: post concussion;  TLSO to wear during waking hours No lifting >10lb as per Dr Rolena Infante 07/05/22   WEIGHT BEARING RESTRICTIONS No   FALLS:  Has patient fallen in last 6 months? No   LIVING ENVIRONMENT: Lives with:  kids   PLOF: Independent   PATIENT GOALS decrease pain, complete daily activities, get back to exercise   OBJECTIVE:    DIAGNOSTIC FINDINGS:  4/29 MRI IMPRESSION: 1. No acute fracture or other traumatic injury in the cervical spine. 2. C5-C6 mild right neural foraminal narrowing.   8/25 Acute nondisplaced fracture of the left L3 transverse process       COGNITION: Overall cognitive status: Difficulty to assess- pt reports cognitive difficulties since the accident    POSTURE:   EVAL: found in lobby in bent over posture with head in chair and ambulated with flexed posture  04/25/22: pt demo upright posture at rest, shifting side to side occasionally while sitting on table. Had to move to hooklying position about 30 min into appointment. Avoids standing fully upright from a seated position when in pain but is able to do so when cued.  9/5: forward flex position with r hand supporting on right knee   PALPATION: EVAL: Significant spasm in cervical and lumbopelvic region      04/12/22:  Rt ASIS lower than Lt (and tender) in standing  06/26/22: Significant spasm left paraspinals cervical spine through lumbar with TTP   CERVICAL ROM:    04/25/22: pt demo cervical ROM within functional limits but facial expressions of pain/discomfort while moving to end ranges 06/26/22: Limited slightly in right rotation due to pain    UUPPER EXTREMITY MMT:   MMT Right eval Left eval  Shoulder flexion      Shoulder extension      Shoulder abduction       Shoulder adduction      Shoulder extension      Shoulder internal rotation      Shoulder external rotation      Middle trapezius      Lower trapezius      Elbow flexion      Elbow extension      Wrist flexion      Wrist extension      Wrist ulnar deviation      Wrist radial deviation      Wrist pronation      Wrist supination      Grip strength       (Blank rows = not tested)  Lumbar spine  Flex: wfl  Ext: neutral  Side bending L: 25% limited by pain  Side bending R:25% limited by  pain   Rotation: wfl         TODAY'S TREATMENT: Pt seen for aquatic therapy today.  Water 3.25-4.8 ft in depth. Temp of water was 92.  Pt entered/exited the pool via stairs  slowly but independently with bilat rail.   - In 4 ft 8" water: forward/ backward walking -L stretch on 3rd step - at bench: plank with hip extension  full range today - side lunge for add stretch -kick board push 3x10 - supported supine  Bad ragaz: for core elongation/stretching and pain relief with lateral snaking    -contract relax QL R/L -gentle knees to chest with kick out to full extension. Lumbar stretch knee assisted knee to chest with gentle overpressure -hip extension R/L x10 -suboccipital manipulation with passive cervical rotation   Pt requires buoyancy for support and to offload joints with strengthening exercises. Viscosity of the water is needed for resistance of strengthening; water current perturbations provides challenge to standing balance unsupported, requiring increased core activation.  Once dry: reg Rock tape applied to Lt QL with 25 % stretch with back in upright position to assist with postural feedback and decompressing tissue.  PATIENT EDUCATION:  Education details: Geophysicist/field seismologist of condition, POC, HEP Person educated: Patient Education method: Explanation Education comprehension: verbalized understanding     HOME EXERCISE PROGRAM: [id number] - from past episode Hip extensor stretch in  sup Prone on ball right hip pressure point Sub-occipital passive stretch    ASSESSMENT:   Improved toleration to exercises today.  Improved hip extension with plank able to reach end range (only neutral last session).  Not using TLSO. Gait with just minimal forward hip flex positioning on land. Fully upright submerged to 4 ft. Released suboccipital ms tightness with deep pressure/massage and PROM. She tolerates session well with reduction in over pain/discomfort. Plan to test TUG, Berg and STS next session as tolerated and progress core strengthening. Note: Dr Rolena Infante did limit her lifting to <10 lbs      Pt with hospital stay after vacation episode where she fell out of tree in [place].  Pt didn't think much about her pain experienced due to it being similar to her "usual " pain.  She came home and began experiencing increased LB and abdominal pain.  Went to [facility] ortho where she passed out when they were getting an xray.  At hospital she was dx with a kidney infection as well as having a nondisplaced fracture of the left L3 transverse process. She presents today, discharged from hospital yesterday, in [facility], walking in forward flexed position at hip and knees from wc to pool.  She is to make a follow up appointment as per dc instructions, with Ortho which she will do when she gets home today. This will be a new MD which we will attain new orders from once she sees him as needed She reports pain in Lumbopelvic area with continued complaints of right psoas area tightness. She is unable to tolerate any testing today so will get orders and re-assess as able next visit as well as complete recert. Note: Pt not wearing sun glasses today seemingly not having any issues with light sensitivity as prior. Focus of aqautic therapy today is stretching and gentle ROM anterior, posterior and lat core to decrease pain sx as well as improve standing posture.    OBJECTIVE IMPAIRMENTS Abnormal gait, decreased activity  tolerance, decreased knowledge of condition, difficulty walking, decreased ROM, increased muscle spasms, impaired flexibility, impaired UE functional use, impaired vision/preception,  postural dysfunction, and pain.    ACTIVITY LIMITATIONS meal prep, cleaning, laundry, personal finances, driving, shopping, and community activity.    PERSONAL FACTORS 1-2 comorbidities: see history above  are also affecting patient's functional outcome.      REHAB POTENTIAL: Good   CLINICAL DECISION MAKING: Unstable/unpredictable   EVALUATION COMPLEXITY: High     GOALS: Goals reviewed with patient? Yes   SHORT TERM GOALS: Target date: 04/02/2022    Pt will verbalize proper rest through her day to reduce migraine symptoms Baseline:  Goal status: Achieved     LONG TERM GOALS: Target date: 08/15/22   Pt will be able to drive around the local area without increase in symptoms Baseline: natural light is better, I can drive here and be ok (<10 min) Goal status: ongoing   2.  Able to return to gentle exercise such as yoga Baseline:  Goal status: ongoing   3.  Pt will be able to demo proper bending and lifting form necessary for ADLs Baseline:  Goal status: ongoing, able to perform motion but required cues to avoid compensation   4.  pt will use regular rest breaks to maintain a physical pain level <=4/10 Baseline:  Goal status: new     PLAN: PT FREQUENCY: 2x/week   PT DURATION: 8 weeks   PLANNED INTERVENTIONS: Therapeutic exercises, Therapeutic activity, Neuromuscular re-education, Balance training, Gait training, Patient/Family education, Joint mobilization, Aquatic Therapy, Dry Needling, Electrical stimulation, Spinal mobilization, Cryotherapy, Moist heat, Taping, Manual therapy, and Re-evaluation   PLAN FOR NEXT SESSION: continue general stability training in pool.   Any new info from upcoming Dr appt?  [facility]) Jillann Charette MPT 07/06/22 11:51 AM [facility] [address] [place], [place], [address] Phone: [phone]   Fax:  [phone]

## 2022-07-09 ENCOUNTER — Ambulatory Visit (HOSPITAL_BASED_OUTPATIENT_CLINIC_OR_DEPARTMENT_OTHER): Payer: 59 | Admitting: Physical Therapy

## 2022-07-09 ENCOUNTER — Encounter (HOSPITAL_BASED_OUTPATIENT_CLINIC_OR_DEPARTMENT_OTHER): Payer: Self-pay | Admitting: Physical Therapy

## 2022-07-09 DIAGNOSIS — M546 Pain in thoracic spine: Secondary | ICD-10-CM

## 2022-07-09 DIAGNOSIS — M62830 Muscle spasm of back: Secondary | ICD-10-CM

## 2022-07-09 DIAGNOSIS — M542 Cervicalgia: Secondary | ICD-10-CM

## 2022-07-09 DIAGNOSIS — S32009A Unspecified fracture of unspecified lumbar vertebra, initial encounter for closed fracture: Secondary | ICD-10-CM | POA: Diagnosis not present

## 2022-07-09 NOTE — Therapy (Signed)
OUTPATIENT PHYSICAL THERAPY TREATMENT    Patient Name: [name] [name] MRN: [id number] DOB:[birth year], 46 y.o., female Today's Date: 07/09/2022       PT End of Session - 07/09/22 0911     Visit Number 25    Number of Visits 37    Date for PT Re-Evaluation 08/15/22    Authorization Type MCR    PT Start Time 0905    PT Stop Time 0945    PT Time Calculation (min) 40 min    Activity Tolerance Patient tolerated treatment well;Patient limited by pain    Behavior During Therapy [facility] for tasks assessed/performed                   Past Medical History:  Diagnosis Date   Adenomatous colon polyp 2008   Anxiety 1999/2000   Arthritis    "jaw joints; neck" (42/03/8340)   Complication of anesthesia    "didn't wake up well/remembered stuff from during OR when I had pituitary surgery"   Constipation    Depression    hx   DVT (deep vein thrombosis) in pregnancy 09/2013   "LLE; postpartum"   Fibromyalgia    GERD (gastroesophageal reflux disease)    H/O hiatal hernia    Hematochezia    IBS (irritable bowel syndrome)    Internal hemorrhoids    Migraines    "couple times/wk" (07/28/2014)   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 Silvano Rusk   ESOPHAGOGASTRODUODENOSCOPY  09/29/2004   Dr. Kennedy Bucker   LAPAROSCOPIC APPENDECTOMY N/A 07/28/2014   Procedure: APPENDECTOMY LAPAROSCOPIC;  Surgeon: Autumn Messing III, MD;  Location: [facility];  Service: General;  Laterality: N/A;   TRANSPHENOIDAL / TRANSNASAL HYPOPHYSECTOMY / RESECTION PITUITARY TUMOR  1999   resection of benign pituitary tumor, [place]  2008   Patient Active Problem List   Diagnosis Date Noted    Pyelonephritis of right kidney 06/15/2022   Hypersomnolence 06/11/2022   Anxiety 06/11/2022   Low back pain 06/11/2022   Other fatigue 06/11/2022   Neck injury 02/18/2022   Chronic migraine w/o aura w/o status migrainosus, not intractable 03/21/2021   Protrusion of lumbar intervertebral disc 03/09/2021   Depression 03/16/2020   Ptosis of right eyelid 11/22/2017   Abnormal brain MRI 01/30/2017   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   Pain 06/21/2016   Tick bite of left lower leg 06/21/2016   Chronic fatigue 06/21/2016   History of pituitary surgery 09/26/2015   ADHD (attention deficit hyperactivity disorder) 05/04/2015   Anxiety state 03/08/2015   Upper respiratory tract infection 11/30/2014   Acute appendicitis 07/28/2014   Other malaise and fatigue 03/09/2014   Unspecified vitamin D deficiency 03/09/2014   Postpartum care following vaginal delivery (12/18) 10/08/2013   Normal labor and delivery 10/08/2013   Chronic migraine 01/20/2013   Headache, chronic migraine without aura, intractable 03/18/2012   Myalgia and myositis 03/18/2012   Lumbar spondylosis 03/18/2012   PERSONAL HISTORY OF ALLERGY TO EGGS- GI SENSITIVITY 05/04/2010   CHANGE IN BOWELS 05/02/2010   IRRITABLE BOWEL SYNDROME 12/04/2009   NONSPECIFIC ABN [employer] RAD&OTH EXAM BILARY TRCT 11/30/2009   GERD 01/31/2009  ABDOMINAL PAIN-EPIGASTRIC 01/31/2009   PERSONAL HX COLONIC POLYPS 12/14/2008   INTERNAL HEMORRHOIDS 01/03/2007   HIATAL HERNIA 09/29/2004   PCP: None   REFERRING PROVIDER: Kary Kos, MD   REFERRING DIAG: M54.2 (ICD-10-CM) - Neck pain   THERAPY DIAG:  Closed fracture of transverse process of lumbar vertebra, initial encounter ([place])  Pain in thoracic spine  Muscle spasm of back  Cervicalgia    SUBJECTIVE:  Pt reports she feels like her body is "more open" but there are some areas that feels like a "catch" - points to top, lateral part of L pelvis.    PERTINENT HISTORY:  See history listed    PAIN:  Are you having pain? Yes: 6/10  Pain location: base of head,  mid to lower back  Pain description: ache Aggravating factors: being upright-gravity; standing Relieving factors: laying down   PRECAUTIONS: Other: post concussion; No lifting >10lb as per Dr Rolena Infante 07/05/22   WEIGHT BEARING RESTRICTIONS No   FALLS:  Has patient fallen in last 6 months? No   LIVING ENVIRONMENT: Lives with:  kids   PLOF: Independent   PATIENT GOALS decrease pain, complete daily activities, get back to exercise   OBJECTIVE:    DIAGNOSTIC FINDINGS:  4/29 MRI IMPRESSION: 1. No acute fracture or other traumatic injury in the cervical spine. 2. C5-C6 mild right neural foraminal narrowing.   8/25 Acute nondisplaced fracture of the left L3 transverse process       COGNITION: Overall cognitive status: Difficulty to assess- pt reports cognitive difficulties since the accident    POSTURE:   EVAL: found in lobby in bent over posture with head in chair and ambulated with flexed posture  04/25/22: pt demo upright posture at rest, shifting side to side occasionally while sitting on table. Had to move to hooklying position about 30 min into appointment. Avoids standing fully upright from a seated position when in pain but is able to do so when cued.  9/5: forward flex position with r hand supporting on right knee   PALPATION: EVAL: Significant spasm in cervical and lumbopelvic region      04/12/22:  Rt ASIS lower than Lt (and tender) in standing  06/26/22: Significant spasm left paraspinals cervical spine through lumbar with TTP   CERVICAL ROM:    04/25/22: pt demo cervical ROM within functional limits but facial expressions of pain/discomfort while moving to end ranges 06/26/22: Limited slightly in right rotation due to pain    UUPPER EXTREMITY MMT:   MMT Right eval Left eval  Shoulder flexion      Shoulder extension      Shoulder abduction      Shoulder adduction      Shoulder extension       Shoulder internal rotation      Shoulder external rotation      Middle trapezius      Lower trapezius      Elbow flexion      Elbow extension      Wrist flexion      Wrist extension      Wrist ulnar deviation      Wrist radial deviation      Wrist pronation      Wrist supination      Grip strength       (Blank rows = not tested)  Lumbar spine  Flex: wfl  Ext: neutral  Side bending L: 25% limited by pain  Side bending R:25% limited by pain   Rotation: wfl  TODAY'S TREATMENT: Pt seen for aquatic therapy today.  Water 3.25-4.8 ft in depth. Temp of water was 92.  Pt entered/exited the pool via stairs  slowly but independently with bilat rail.   - In 4 ft 8" water: straddling yellow noodle- cycling with breast stroke arms, cc ski, with noodle under arms - hip abdct / add  - 4 ft: Warrior 1 reaching noodle towards ceiling; warrior 2; modified triangle pose;  warrior 3 with UE on yellow noodle   - seated spinal twist at bench - plank with noodle pull downs of yellow noodle - return to cycling on noodle - slow and controlled pendulums (front to/from back, side to side) 5 in each direction     Pt requires buoyancy for support and to offload joints with strengthening exercises. Viscosity of the water is needed for resistance of strengthening; water current perturbations provides challenge to standing balance unsupported, requiring increased core activation.  Once dry: reg Rock tape applied across sacrum in X pattern with 25 % stretch with back in upright position to assist with postural feedback and decompressing tissue.  PATIENT EDUCATION:  Education details: Geophysicist/field seismologist of condition, POC, HEP Person educated: Patient Education method: Explanation Education comprehension: verbalized understanding     HOME EXERCISE PROGRAM: [id number] - from past episode Hip extensor stretch in sup Prone on ball right hip pressure point Sub-occipital passive stretch    ASSESSMENT:   Pt more upright upon arriving today; although returns to forward flexed posture once exiting water.  She is able to maintain good posture and positioning in 4+ ft of water.  Focus on core engagement/ spinal stabilization with good posture throughout session.  Encouraged pt to not over do it or over stretch.   She tolerates session well with reduction in over pain/discomfort. Plan to test TUG, Berg and STS in next session as tolerated and progress core strengthening. Note: Dr Rolena Infante did limit her lifting to <10 lbs    OBJECTIVE IMPAIRMENTS Abnormal gait, decreased activity tolerance, decreased knowledge of condition, difficulty walking, decreased ROM, increased muscle spasms, impaired flexibility, impaired UE functional use, impaired vision/preception, postural dysfunction, and pain.    ACTIVITY LIMITATIONS meal prep, cleaning, laundry, personal finances, driving, shopping, and community activity.    PERSONAL FACTORS 1-2 comorbidities: see history above  are also affecting patient's functional outcome.      REHAB POTENTIAL: Good   CLINICAL DECISION MAKING: Unstable/unpredictable   EVALUATION COMPLEXITY: High     GOALS: Goals reviewed with patient? Yes   SHORT TERM GOALS: Target date: 04/02/2022    Pt will verbalize proper rest through her day to reduce migraine symptoms Baseline:  Goal status: Achieved     LONG TERM GOALS: Target date: 08/15/22   Pt will be able to drive around the local area without increase in symptoms Baseline: natural light is better, I can drive here and be ok (<10 min) Goal status: ongoing   2.  Able to return to gentle exercise such as yoga Baseline:  Goal status: ongoing   3.  Pt will be able to demo proper bending and lifting form necessary for ADLs Baseline:  Goal status: ongoing, able to perform motion but required cues to avoid compensation   4.  pt will use regular rest breaks to maintain a physical pain level <=4/10 Baseline:  Goal status:  new     PLAN: PT FREQUENCY: 2x/week   PT DURATION: 8 weeks   PLANNED INTERVENTIONS: Therapeutic exercises, Therapeutic activity, Neuromuscular re-education, Balance training, Gait  training, Patient/Family education, Joint mobilization, Aquatic Therapy, Dry Needling, Electrical stimulation, Spinal mobilization, Cryotherapy, Moist heat, Taping, Manual therapy, and Re-evaluation   PLAN FOR NEXT SESSION: continue general stability training in pool.  Test STS, TUG, Berg?  Kerin Perna, PTA 07/09/22 11:01 AM [facility] [address] [place], [place], [address] Phone: [phone]   Fax:  [phone]

## 2022-07-12 ENCOUNTER — Ambulatory Visit (HOSPITAL_BASED_OUTPATIENT_CLINIC_OR_DEPARTMENT_OTHER): Payer: 59 | Admitting: Physical Therapy

## 2022-07-12 ENCOUNTER — Encounter (HOSPITAL_BASED_OUTPATIENT_CLINIC_OR_DEPARTMENT_OTHER): Payer: Self-pay | Admitting: Physical Therapy

## 2022-07-12 DIAGNOSIS — M546 Pain in thoracic spine: Secondary | ICD-10-CM

## 2022-07-12 DIAGNOSIS — M542 Cervicalgia: Secondary | ICD-10-CM

## 2022-07-12 DIAGNOSIS — M62830 Muscle spasm of back: Secondary | ICD-10-CM

## 2022-07-12 DIAGNOSIS — S32009A Unspecified fracture of unspecified lumbar vertebra, initial encounter for closed fracture: Secondary | ICD-10-CM

## 2022-07-12 NOTE — Therapy (Signed)
OUTPATIENT PHYSICAL THERAPY TREATMENT    Patient Name: [name] [name] MRN: [id number] DOB:03-Mar-1976, 46 y.o., female Today's Date: 07/12/2022       PT End of Session - 07/12/22 1412     Visit Number 26    Number of Visits 37    Date for PT Re-Evaluation 08/15/22    Authorization Type MCR    Progress Note Due on Visit 85    PT Start Time 1400    PT Stop Time 1440    PT Time Calculation (min) 40 min    Activity Tolerance Patient tolerated treatment well;Patient limited by pain    Behavior During Therapy [facility] for tasks assessed/performed                   Past Medical History:  Diagnosis Date   Adenomatous colon polyp 2008   Anxiety 1999/2000   Arthritis    "jaw joints; neck" (74/06/4495)   Complication of anesthesia    "didn't wake up well/remembered stuff from during OR when I had pituitary surgery"   Constipation    Depression    hx   DVT (deep vein thrombosis) in pregnancy 09/2013   "LLE; postpartum"   Fibromyalgia    GERD (gastroesophageal reflux disease)    H/O hiatal hernia    Hematochezia    IBS (irritable bowel syndrome)    Internal hemorrhoids    Migraines    "couple times/wk" (07/28/2014)   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 Silvano Rusk   ESOPHAGOGASTRODUODENOSCOPY  09/29/2004   Dr. Kennedy Bucker   LAPAROSCOPIC APPENDECTOMY N/A 07/28/2014   Procedure: APPENDECTOMY LAPAROSCOPIC;  Surgeon: Autumn Messing III, MD;  Location: [facility] OR;  Service: General;  Laterality: N/A;   TRANSPHENOIDAL / TRANSNASAL HYPOPHYSECTOMY / RESECTION PITUITARY TUMOR  1999   resection of benign pituitary tumor, [place]  2008   Patient Active Problem  List   Diagnosis Date Noted   Pyelonephritis of right kidney 06/15/2022   Hypersomnolence 06/11/2022   Anxiety 06/11/2022   Low back pain 06/11/2022   Other fatigue 06/11/2022   Neck injury 02/18/2022   Chronic migraine w/o aura w/o status migrainosus, not intractable 03/21/2021   Protrusion of lumbar intervertebral disc 03/09/2021   Depression 03/16/2020   Ptosis of right eyelid 11/22/2017   Abnormal brain MRI 01/30/2017   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   Pain 06/21/2016   Tick bite of left lower leg 06/21/2016   Chronic fatigue 06/21/2016   History of pituitary surgery 09/26/2015   ADHD (attention deficit hyperactivity disorder) 05/04/2015   Anxiety state 03/08/2015   Upper respiratory tract infection 11/30/2014   Acute appendicitis 07/28/2014   Other malaise and fatigue 03/09/2014   Unspecified vitamin D deficiency 03/09/2014   Postpartum care following vaginal delivery (12/18) 10/08/2013   Normal labor and delivery 10/08/2013   Chronic migraine 01/20/2013   Headache, chronic migraine without aura, intractable 03/18/2012   Myalgia and myositis 03/18/2012   Lumbar spondylosis 03/18/2012   PERSONAL HISTORY OF ALLERGY TO EGGS- GI SENSITIVITY 05/04/2010   CHANGE IN BOWELS 05/02/2010   IRRITABLE BOWEL SYNDROME 12/04/2009   NONSPECIFIC ABN [employer]  RAD&OTH EXAM BILARY TRCT 11/30/2009   GERD 01/31/2009   ABDOMINAL PAIN-EPIGASTRIC 01/31/2009   PERSONAL HX COLONIC POLYPS 12/14/2008   INTERNAL HEMORRHOIDS 01/03/2007   HIATAL HERNIA 09/29/2004   PCP: None   REFERRING PROVIDER: Kary Kos, MD   REFERRING DIAG: M54.2 (ICD-10-CM) - Neck pain   THERAPY DIAG:  Closed fracture of transverse process of lumbar vertebra, initial encounter ([place])  Pain in thoracic spine  Muscle spasm of back  Cervicalgia    SUBJECTIVE:  Pt reports she has been leaning over her counter more lately since she isn't wearing the brace.   Pt reports she can stand 10 min max before she needs the  support of surface.    PERTINENT HISTORY:  See history listed   PAIN:  Are you having pain? Yes: 6/10  Pain location: base of head,  mid to lower back  Pain description: ache Aggravating factors: being upright-gravity; standing Relieving factors: laying down   PRECAUTIONS: Other: post concussion; No lifting >10lb as per Dr Rolena Infante 07/05/22   WEIGHT BEARING RESTRICTIONS No   FALLS:  Has patient fallen in last 6 months? No   LIVING ENVIRONMENT: Lives with:  kids   PLOF: Independent   PATIENT GOALS decrease pain, complete daily activities, get back to exercise   OBJECTIVE:    DIAGNOSTIC FINDINGS:  4/29 MRI IMPRESSION: 1. No acute fracture or other traumatic injury in the cervical spine. 2. C5-C6 mild right neural foraminal narrowing.   8/25 Acute nondisplaced fracture of the left L3 transverse process       COGNITION: Overall cognitive status: Difficulty to assess- pt reports cognitive difficulties since the accident    POSTURE:   EVAL: found in lobby in bent over posture with head in chair and ambulated with flexed posture  04/25/22: pt demo upright posture at rest, shifting side to side occasionally while sitting on table. Had to move to hooklying position about 30 min into appointment. Avoids standing fully upright from a seated position when in pain but is able to do so when cued.  9/5: forward flex position with r hand supporting on right knee   PALPATION: EVAL: Significant spasm in cervical and lumbopelvic region      04/12/22:  Rt ASIS lower than Lt (and tender) in standing  06/26/22: Significant spasm left paraspinals cervical spine through lumbar with TTP   CERVICAL ROM:    04/25/22: pt demo cervical ROM within functional limits but facial expressions of pain/discomfort while moving to end ranges 06/26/22: Limited slightly in right rotation due to pain    UUPPER EXTREMITY MMT:   MMT Right eval Left eval  Shoulder flexion      Shoulder extension       Shoulder abduction      Shoulder adduction      Shoulder extension      Shoulder internal rotation      Shoulder external rotation      Middle trapezius      Lower trapezius      Elbow flexion      Elbow extension      Wrist flexion      Wrist extension      Wrist ulnar deviation      Wrist radial deviation      Wrist pronation      Wrist supination      Grip strength       (Blank rows = not tested)  Lumbar spine  Flex: wfl  Ext: neutral  Side bending  L: 25% limited by pain  Side bending R:25% limited by pain   Rotation: wfl         TODAY'S TREATMENT: Pt seen for aquatic therapy today.  Water 3.25-4.8 ft in depth. Temp of water was 92.  Pt entered/exited the pool via stairs  slowly but independently with bilat rail.   - In 4 ft 8" water: straddling yellow noodle- cycling with breast stroke arms,  - 4 ft: Warrior 1 reaching noodle towards ceiling; warrior 2; modified triangle pose with pulses / release;  warrior 3 with UE on yellow noodle   - Open book holding yellow hand buoys - slow and controlled pendulums (front to/from back, side to side) 5 in each direction  - plank with yellow noodle - pull downs too painful in low back -holding yellow noodle forward walking, working on upright posture with decreasing depth of water - at bench - opp arm/leg lift  - return to cycling with noodle under arms; cc ski - walking towards exit with upright posture to stair holding noodle   Pt requires buoyancy for support and to offload joints with strengthening exercises. Viscosity of the water is needed for resistance of strengthening; water current perturbations provides challenge to standing balance unsupported, requiring increased core activation  PATIENT EDUCATION:  Education details: Anatomy of condition, POC, HEP Person educated: Patient Education method: Explanation Education comprehension: verbalized understanding     HOME EXERCISE PROGRAM: [id number] - from past  episode Hip extensor stretch in sup Prone on ball right hip pressure point Sub-occipital passive stretch    ASSESSMENT:  Pt more upright upon arriving today; although returns to forward flexed posture frequently during session.   She is able to maintain good posture and positioning in 4+ ft of water.  Continued focus on core engagement/ spinal stabilization with good posture throughout session. Again, encouraged pt to not over do it or over stretch. Note: Dr Rolena Infante did limit her lifting to <10 lbs    OBJECTIVE IMPAIRMENTS Abnormal gait, decreased activity tolerance, decreased knowledge of condition, difficulty walking, decreased ROM, increased muscle spasms, impaired flexibility, impaired UE functional use, impaired vision/preception, postural dysfunction, and pain.    ACTIVITY LIMITATIONS meal prep, cleaning, laundry, personal finances, driving, shopping, and community activity.    PERSONAL FACTORS 1-2 comorbidities: see history above  are also affecting patient's functional outcome.      REHAB POTENTIAL: Good   CLINICAL DECISION MAKING: Unstable/unpredictable   EVALUATION COMPLEXITY: High     GOALS: Goals reviewed with patient? Yes   SHORT TERM GOALS: Target date: 04/02/2022    Pt will verbalize proper rest through her day to reduce migraine symptoms Baseline:  Goal status: Achieved     LONG TERM GOALS: Target date: 08/15/22   Pt will be able to drive around the local area without increase in symptoms Baseline: natural light is better, I can drive here and be ok (<10 min) Goal status: ongoing   2.  Able to return to gentle exercise such as yoga Baseline:  Goal status: ongoing   3.  Pt will be able to demo proper bending and lifting form necessary for ADLs Baseline:  Goal status: ongoing, able to perform motion but required cues to avoid compensation   4.  pt will use regular rest breaks to maintain a physical pain level <=4/10 Baseline:  Goal status: new      PLAN: PT FREQUENCY: 2x/week   PT DURATION: 8 weeks   PLANNED INTERVENTIONS: Therapeutic exercises, Therapeutic activity, Neuromuscular  re-education, Balance training, Gait training, Patient/Family education, Joint mobilization, Aquatic Therapy, Dry Needling, Electrical stimulation, Spinal mobilization, Cryotherapy, Moist heat, Taping, Manual therapy, and Re-evaluation   PLAN FOR NEXT SESSION: continue general stability training in pool.  Test STS, TUG, Berg?  Kerin Perna, PTA 07/12/22 2:13 PM [facility] [address] [place], [place], [address] Phone: [phone]   Fax:  [phone]

## 2022-07-13 ENCOUNTER — Ambulatory Visit (HOSPITAL_BASED_OUTPATIENT_CLINIC_OR_DEPARTMENT_OTHER): Payer: Medicare Other | Admitting: Physical Therapy

## 2022-07-17 ENCOUNTER — Ambulatory Visit (HOSPITAL_BASED_OUTPATIENT_CLINIC_OR_DEPARTMENT_OTHER): Payer: 59 | Admitting: Physical Therapy

## 2022-07-17 DIAGNOSIS — M542 Cervicalgia: Secondary | ICD-10-CM

## 2022-07-17 DIAGNOSIS — M62830 Muscle spasm of back: Secondary | ICD-10-CM

## 2022-07-17 DIAGNOSIS — M546 Pain in thoracic spine: Secondary | ICD-10-CM

## 2022-07-17 DIAGNOSIS — S32009A Unspecified fracture of unspecified lumbar vertebra, initial encounter for closed fracture: Secondary | ICD-10-CM
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
## 2022-07-19 ENCOUNTER — Emergency Department (HOSPITAL_BASED_OUTPATIENT_CLINIC_OR_DEPARTMENT_OTHER)
Admission: EM | Admit: 2022-07-19 | Discharge: 2022-07-19 | Disposition: A | Discharge status: Home / Self Care | Payer: 59 | Attending: Emergency Medicine | Admitting: Emergency Medicine

## 2022-07-19 ENCOUNTER — Encounter (HOSPITAL_BASED_OUTPATIENT_CLINIC_OR_DEPARTMENT_OTHER): Payer: Self-pay | Admitting: Emergency Medicine

## 2022-07-19 ENCOUNTER — Emergency Department (HOSPITAL_BASED_OUTPATIENT_CLINIC_OR_DEPARTMENT_OTHER): Payer: 59

## 2022-07-19 ENCOUNTER — Other Ambulatory Visit: Payer: Self-pay

## 2022-07-19 ENCOUNTER — Ambulatory Visit (HOSPITAL_BASED_OUTPATIENT_CLINIC_OR_DEPARTMENT_OTHER): Payer: 59 | Admitting: Physical Therapy

## 2022-07-19 ENCOUNTER — Encounter (HOSPITAL_BASED_OUTPATIENT_CLINIC_OR_DEPARTMENT_OTHER): Payer: Self-pay | Admitting: Physical Therapy

## 2022-07-19 ENCOUNTER — Institutional Professional Consult (permissible substitution): Payer: Medicare Other | Admitting: Neurology

## 2022-07-19 ENCOUNTER — Emergency Department (HOSPITAL_BASED_OUTPATIENT_CLINIC_OR_DEPARTMENT_OTHER): Payer: 59 | Admitting: Radiology

## 2022-07-19 DIAGNOSIS — S32009A Unspecified fracture of unspecified lumbar vertebra, initial encounter for closed fracture: Secondary | ICD-10-CM

## 2022-07-19 DIAGNOSIS — R519 Headache, unspecified: Secondary | ICD-10-CM | POA: Insufficient documentation

## 2022-07-19 DIAGNOSIS — M542 Cervicalgia: Secondary | ICD-10-CM | POA: Diagnosis not present

## 2022-07-19 DIAGNOSIS — R1110 Vomiting, unspecified: Secondary | ICD-10-CM | POA: Diagnosis not present

## 2022-07-19 DIAGNOSIS — Z91040 Latex allergy status: Secondary | ICD-10-CM | POA: Insufficient documentation

## 2022-07-19 DIAGNOSIS — H538 Other visual disturbances: Secondary | ICD-10-CM | POA: Diagnosis not present

## 2022-07-19 DIAGNOSIS — M546 Pain in thoracic spine: Secondary | ICD-10-CM

## 2022-07-19 DIAGNOSIS — Y92410 Unspecified street and highway as the place of occurrence of the external cause: Secondary | ICD-10-CM | POA: Diagnosis not present

## 2022-07-19 DIAGNOSIS — E871 Hypo-osmolality and hyponatremia: Secondary | ICD-10-CM | POA: Insufficient documentation

## 2022-07-19 DIAGNOSIS — M62830 Muscle spasm of back: Secondary | ICD-10-CM

## 2022-07-19 LAB — URINALYSIS, ROUTINE W REFLEX MICROSCOPIC
Bilirubin Urine: NEGATIVE
Glucose, UA: NEGATIVE mg/dL
Hgb urine dipstick: NEGATIVE
Ketones, ur: NEGATIVE mg/dL
Leukocytes,Ua: NEGATIVE
Nitrite: NEGATIVE
Protein, ur: NEGATIVE mg/dL
Specific Gravity, Urine: 1.026 (ref 1.005–1.030)
pH: 6.5 (ref 5.0–8.0)

## 2022-07-19 LAB — BASIC METABOLIC PANEL
Anion gap: 7 (ref 5–15)
BUN: 12 mg/dL (ref 6–20)
CO2: 26 mmol/L (ref 22–32)
Calcium: 9.3 mg/dL (ref 8.9–10.3)
Chloride: 101 mmol/L (ref 98–111)
Creatinine, Ser: 0.65 mg/dL (ref 0.44–1.00)
GFR, Estimated: >60 mL/min (ref >60)
Glucose, Bld: 80 mg/dL (ref 70–99)
Potassium: 3.5 mmol/L (ref 3.5–5.1)
Sodium: 134 mmol/L — ABNORMAL LOW (ref 135–145)

## 2022-07-19 LAB — CBC WITH DIFFERENTIAL/PLATELET
Abs Immature Granulocytes: 0.03 10*3/uL (ref 0.00–0.07)
Basophils Absolute: 0.1 10*3/uL (ref 0.0–0.1)
Basophils Relative: 1 %
Eosinophils Absolute: 0.1 10*3/uL (ref 0.0–0.5)
Eosinophils Relative: 2 %
HCT: 40.5 % (ref 36.0–46.0)
Hemoglobin: 13.1 g/dL (ref 12.0–15.0)
Immature Granulocytes: 0 %
Lymphocytes Relative: 27 %
Lymphs Abs: 1.9 10*3/uL (ref 0.7–4.0)
MCH: 28.9 pg (ref 26.0–34.0)
MCHC: 32.3 g/dL (ref 30.0–36.0)
MCV: 89.2 fL (ref 80.0–100.0)
Monocytes Absolute: 0.6 10*3/uL (ref 0.1–1.0)
Monocytes Relative: 9 %
Neutro Abs: 4.2 10*3/uL (ref 1.7–7.7)
Neutrophils Relative %: 61 %
Platelets: 274 10*3/uL (ref 150–400)
RBC: 4.54 MIL/uL (ref 3.87–5.11)
RDW: 14.2 % (ref 11.5–15.5)
WBC: 6.9 10*3/uL (ref 4.0–10.5)
nRBC: 0 % (ref 0.0–0.2)

## 2022-07-19 LAB — PREGNANCY, URINE: Preg Test, Ur: NEGATIVE

## 2022-07-19 MED ORDER — SODIUM CHLORIDE 0.9 % IV BOLUS
1000.0000 mL | Freq: Once | INTRAVENOUS | Status: AC
  Administered 2022-07-19: 1000 mL via INTRAVENOUS

## 2022-07-19 MED ORDER — PROCHLORPERAZINE EDISYLATE 10 MG/2ML IJ SOLN
10.0000 mg | Freq: Once | INTRAMUSCULAR | Status: AC
  Administered 2022-07-19: 10 mg via INTRAVENOUS
  Filled 2022-07-19: qty 2

## 2022-07-19 MED ORDER — DEXAMETHASONE SODIUM PHOSPHATE 10 MG/ML IJ SOLN
10.0000 mg | Freq: Once | INTRAMUSCULAR | Status: AC
  Administered 2022-07-19: 10 mg via INTRAVENOUS
  Filled 2022-07-19: qty 1

## 2022-07-19 MED ORDER — KETOROLAC TROMETHAMINE 15 MG/ML IJ SOLN
15.0000 mg | Freq: Once | INTRAMUSCULAR | Status: AC
  Administered 2022-07-19: 15 mg via INTRAVENOUS
  Filled 2022-07-19: qty 1
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
## 2022-07-19 NOTE — ED Notes (Signed)
Pt states that she does have a little nausea and some blurry vision. Pt is not in distress and has a calm disposition pt could follow commands and conversation. Pt requested water but was told that she would have to wait until imaging resulted.
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
## 2022-07-19 NOTE — ED Notes (Signed)
Ice given for the pts neck.
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
## 2022-07-24 ENCOUNTER — Ambulatory Visit (HOSPITAL_BASED_OUTPATIENT_CLINIC_OR_DEPARTMENT_OTHER): Payer: 59 | Attending: Neurosurgery | Admitting: Physical Therapy

## 2022-07-24 ENCOUNTER — Encounter (HOSPITAL_BASED_OUTPATIENT_CLINIC_OR_DEPARTMENT_OTHER): Payer: Self-pay | Admitting: Physical Therapy

## 2022-07-24 DIAGNOSIS — M542 Cervicalgia: Secondary | ICD-10-CM

## 2022-07-24 DIAGNOSIS — M546 Pain in thoracic spine: Secondary | ICD-10-CM

## 2022-07-24 DIAGNOSIS — S32009A Unspecified fracture of unspecified lumbar vertebra, initial encounter for closed fracture: Secondary | ICD-10-CM | POA: Diagnosis present

## 2022-07-24 DIAGNOSIS — M62838 Other muscle spasm: Secondary | ICD-10-CM | POA: Diagnosis present

## 2022-07-24 DIAGNOSIS — G8929 Other chronic pain: Secondary | ICD-10-CM | POA: Diagnosis present

## 2022-07-24 DIAGNOSIS — M62830 Muscle spasm of back: Secondary | ICD-10-CM | POA: Diagnosis present

## 2022-07-24 DIAGNOSIS — M545 Low back pain, unspecified: Secondary | ICD-10-CM | POA: Insufficient documentation
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
## 2022-07-26 ENCOUNTER — Ambulatory Visit (HOSPITAL_BASED_OUTPATIENT_CLINIC_OR_DEPARTMENT_OTHER): Payer: 59 | Admitting: Physical Therapy

## 2022-07-26 DIAGNOSIS — G8929 Other chronic pain: Secondary | ICD-10-CM

## 2022-07-26 DIAGNOSIS — M546 Pain in thoracic spine: Secondary | ICD-10-CM | POA: Diagnosis not present

## 2022-07-26 DIAGNOSIS — M62838 Other muscle spasm: Secondary | ICD-10-CM

## 2022-07-26 DIAGNOSIS — M62830 Muscle spasm of back: Secondary | ICD-10-CM

## 2022-07-26 DIAGNOSIS — M542 Cervicalgia: Secondary | ICD-10-CM
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
## 2022-07-31 ENCOUNTER — Ambulatory Visit (HOSPITAL_BASED_OUTPATIENT_CLINIC_OR_DEPARTMENT_OTHER): Payer: Medicare Other | Admitting: Physical Therapy

## 2022-08-01 ENCOUNTER — Encounter (HOSPITAL_BASED_OUTPATIENT_CLINIC_OR_DEPARTMENT_OTHER): Payer: Self-pay | Admitting: Physical Therapy

## 2022-08-01 ENCOUNTER — Ambulatory Visit (HOSPITAL_BASED_OUTPATIENT_CLINIC_OR_DEPARTMENT_OTHER): Payer: 59 | Admitting: Physical Therapy

## 2022-08-01 DIAGNOSIS — G8929 Other chronic pain: Secondary | ICD-10-CM

## 2022-08-01 DIAGNOSIS — M542 Cervicalgia: Secondary | ICD-10-CM

## 2022-08-01 DIAGNOSIS — M62838 Other muscle spasm: Secondary | ICD-10-CM

## 2022-08-01 DIAGNOSIS — M62830 Muscle spasm of back: Secondary | ICD-10-CM

## 2022-08-01 DIAGNOSIS — M546 Pain in thoracic spine: Secondary | ICD-10-CM | POA: Diagnosis not present
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
## 2022-08-02 ENCOUNTER — Ambulatory Visit (HOSPITAL_BASED_OUTPATIENT_CLINIC_OR_DEPARTMENT_OTHER): Payer: Medicare Other | Admitting: Physical Therapy

## 2022-08-03 ENCOUNTER — Encounter (HOSPITAL_BASED_OUTPATIENT_CLINIC_OR_DEPARTMENT_OTHER): Payer: Self-pay | Admitting: Physical Therapy

## 2022-08-03 ENCOUNTER — Telehealth: Payer: Self-pay

## 2022-08-03 ENCOUNTER — Ambulatory Visit (HOSPITAL_BASED_OUTPATIENT_CLINIC_OR_DEPARTMENT_OTHER): Payer: 59 | Admitting: Physical Therapy

## 2022-08-03 DIAGNOSIS — M542 Cervicalgia: Secondary | ICD-10-CM

## 2022-08-03 DIAGNOSIS — M546 Pain in thoracic spine: Secondary | ICD-10-CM | POA: Diagnosis not present

## 2022-08-03 DIAGNOSIS — G8929 Other chronic pain: Secondary | ICD-10-CM

## 2022-08-03 DIAGNOSIS — M62830 Muscle spasm of back: Secondary | ICD-10-CM

## 2022-08-03 MED ORDER — METHYLPREDNISOLONE 4 MG PO TBPK: ORAL_TABLET | ORAL | 1 refills | Status: DC
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
## 2022-08-07 ENCOUNTER — Other Ambulatory Visit: Payer: Self-pay

## 2022-08-07 ENCOUNTER — Encounter (HOSPITAL_BASED_OUTPATIENT_CLINIC_OR_DEPARTMENT_OTHER): Payer: Self-pay | Admitting: Physical Therapy

## 2022-08-07 ENCOUNTER — Ambulatory Visit (HOSPITAL_BASED_OUTPATIENT_CLINIC_OR_DEPARTMENT_OTHER): Payer: 59 | Admitting: Physical Therapy

## 2022-08-07 DIAGNOSIS — M542 Cervicalgia: Secondary | ICD-10-CM

## 2022-08-07 DIAGNOSIS — M545 Low back pain, unspecified: Secondary | ICD-10-CM

## 2022-08-07 DIAGNOSIS — M546 Pain in thoracic spine: Secondary | ICD-10-CM | POA: Diagnosis not present

## 2022-08-07 DIAGNOSIS — M62830 Muscle spasm of back: Secondary | ICD-10-CM
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
## 2022-08-09 ENCOUNTER — Ambulatory Visit (HOSPITAL_BASED_OUTPATIENT_CLINIC_OR_DEPARTMENT_OTHER): Payer: 59 | Admitting: Physical Therapy

## 2022-08-09 ENCOUNTER — Encounter (HOSPITAL_BASED_OUTPATIENT_CLINIC_OR_DEPARTMENT_OTHER): Payer: Self-pay | Admitting: Physical Therapy

## 2022-08-09 DIAGNOSIS — M542 Cervicalgia: Secondary | ICD-10-CM

## 2022-08-09 DIAGNOSIS — M62830 Muscle spasm of back: Secondary | ICD-10-CM

## 2022-08-09 DIAGNOSIS — M546 Pain in thoracic spine: Secondary | ICD-10-CM | POA: Diagnosis not present

## 2022-08-09 DIAGNOSIS — G8929 Other chronic pain: Secondary | ICD-10-CM

## 2022-08-09 DIAGNOSIS — M62838 Other muscle spasm: Secondary | ICD-10-CM
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
## 2022-08-14 ENCOUNTER — Encounter (HOSPITAL_BASED_OUTPATIENT_CLINIC_OR_DEPARTMENT_OTHER): Payer: Self-pay | Admitting: Physical Therapy

## 2022-08-14 ENCOUNTER — Ambulatory Visit (HOSPITAL_BASED_OUTPATIENT_CLINIC_OR_DEPARTMENT_OTHER): Payer: 59 | Admitting: Physical Therapy

## 2022-08-14 DIAGNOSIS — M62838 Other muscle spasm: Secondary | ICD-10-CM

## 2022-08-14 DIAGNOSIS — M542 Cervicalgia: Secondary | ICD-10-CM

## 2022-08-14 DIAGNOSIS — M546 Pain in thoracic spine: Secondary | ICD-10-CM | POA: Diagnosis not present

## 2022-08-14 DIAGNOSIS — M545 Low back pain, unspecified: Secondary | ICD-10-CM

## 2022-08-14 DIAGNOSIS — M62830 Muscle spasm of back: Secondary | ICD-10-CM

## 2022-08-16 ENCOUNTER — Ambulatory Visit (HOSPITAL_BASED_OUTPATIENT_CLINIC_OR_DEPARTMENT_OTHER): Payer: 59 | Admitting: Physical Therapy

## 2022-08-16 ENCOUNTER — Encounter (HOSPITAL_BASED_OUTPATIENT_CLINIC_OR_DEPARTMENT_OTHER): Payer: Self-pay | Admitting: Physical Therapy

## 2022-08-16 DIAGNOSIS — M546 Pain in thoracic spine: Secondary | ICD-10-CM | POA: Diagnosis not present

## 2022-08-16 DIAGNOSIS — M542 Cervicalgia: Secondary | ICD-10-CM

## 2022-08-16 DIAGNOSIS — M62830 Muscle spasm of back: Secondary | ICD-10-CM

## 2022-08-16 DIAGNOSIS — M62838 Other muscle spasm: Secondary | ICD-10-CM

## 2022-08-16 DIAGNOSIS — M545 Low back pain, unspecified: Secondary | ICD-10-CM

## 2022-08-16 DIAGNOSIS — S32009A Unspecified fracture of unspecified lumbar vertebra, initial encounter for closed fracture: Secondary | ICD-10-CM
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
## 2022-08-21 ENCOUNTER — Ambulatory Visit (HOSPITAL_BASED_OUTPATIENT_CLINIC_OR_DEPARTMENT_OTHER): Payer: 59 | Admitting: Physical Therapy

## 2022-08-21 ENCOUNTER — Encounter (HOSPITAL_BASED_OUTPATIENT_CLINIC_OR_DEPARTMENT_OTHER): Payer: Self-pay | Admitting: Physical Therapy

## 2022-08-21 DIAGNOSIS — M62838 Other muscle spasm: Secondary | ICD-10-CM

## 2022-08-21 DIAGNOSIS — M546 Pain in thoracic spine: Secondary | ICD-10-CM

## 2022-08-21 DIAGNOSIS — G8929 Other chronic pain: Secondary | ICD-10-CM

## 2022-08-21 DIAGNOSIS — M542 Cervicalgia: Secondary | ICD-10-CM

## 2022-08-21 DIAGNOSIS — M62830 Muscle spasm of back: Secondary | ICD-10-CM
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
## 2022-08-23 ENCOUNTER — Encounter (HOSPITAL_BASED_OUTPATIENT_CLINIC_OR_DEPARTMENT_OTHER): Payer: Self-pay | Admitting: Physical Therapy

## 2022-08-24 ENCOUNTER — Ambulatory Visit (HOSPITAL_BASED_OUTPATIENT_CLINIC_OR_DEPARTMENT_OTHER): Payer: 59 | Attending: Neurosurgery | Admitting: Physical Therapy

## 2022-08-24 DIAGNOSIS — M62838 Other muscle spasm: Secondary | ICD-10-CM

## 2022-08-24 DIAGNOSIS — M62830 Muscle spasm of back: Secondary | ICD-10-CM

## 2022-08-24 DIAGNOSIS — M545 Low back pain, unspecified: Secondary | ICD-10-CM | POA: Diagnosis present

## 2022-08-24 DIAGNOSIS — M546 Pain in thoracic spine: Secondary | ICD-10-CM

## 2022-08-24 DIAGNOSIS — G8929 Other chronic pain: Secondary | ICD-10-CM | POA: Diagnosis present

## 2022-08-24 DIAGNOSIS — M542 Cervicalgia: Secondary | ICD-10-CM | POA: Diagnosis present
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
## 2022-08-27 ENCOUNTER — Ambulatory Visit (HOSPITAL_BASED_OUTPATIENT_CLINIC_OR_DEPARTMENT_OTHER): Payer: 59 | Admitting: Physical Therapy

## 2022-08-28 ENCOUNTER — Telehealth: Payer: Self-pay

## 2022-08-28 NOTE — Telephone Encounter (Signed)
Patient is due for botox reverification.  Botox 200 units  G43.709  Dr. Marcial Pacas

## 2022-08-29 ENCOUNTER — Encounter (HOSPITAL_BASED_OUTPATIENT_CLINIC_OR_DEPARTMENT_OTHER): Payer: Self-pay | Admitting: Physical Therapy

## 2022-08-29 ENCOUNTER — Ambulatory Visit (HOSPITAL_BASED_OUTPATIENT_CLINIC_OR_DEPARTMENT_OTHER): Payer: 59 | Admitting: Physical Therapy

## 2022-08-29 ENCOUNTER — Other Ambulatory Visit (HOSPITAL_COMMUNITY): Payer: Self-pay

## 2022-08-29 ENCOUNTER — Encounter (HOSPITAL_BASED_OUTPATIENT_CLINIC_OR_DEPARTMENT_OTHER): Payer: 59 | Admitting: Physical Therapy

## 2022-08-29 DIAGNOSIS — M62838 Other muscle spasm: Secondary | ICD-10-CM

## 2022-08-29 DIAGNOSIS — M546 Pain in thoracic spine: Secondary | ICD-10-CM

## 2022-08-29 DIAGNOSIS — M542 Cervicalgia: Secondary | ICD-10-CM

## 2022-08-29 DIAGNOSIS — M62830 Muscle spasm of back: Secondary | ICD-10-CM

## 2022-08-29 DIAGNOSIS — G8929 Other chronic pain: Secondary | ICD-10-CM
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
## 2022-08-29 NOTE — Telephone Encounter (Signed)
Patient Advocate Encounter   Received notification that prior authorization for Botox 200UNIT solution is required.   PA submitted on 08/29/2022 Key [id number] Status is pending       Lyndel Safe, [name] [name] Patient Advocate Specialist [place] Patient Advocate Team Direct Number: [phone]  Fax: [phone]

## 2022-08-30 ENCOUNTER — Other Ambulatory Visit (HOSPITAL_COMMUNITY): Payer: Self-pay

## 2022-08-30 NOTE — Telephone Encounter (Signed)
Patient Advocate Encounter  Prior Authorization for Botox 200UNIT solution  has been approved.    PA# [id number] Effective dates: 08/29/2022 through 11/29/2022  Can be filled at [place], [place] Patient [place] Patient Advocate Team Direct Number: [phone]  Fax: [phone]

## 2022-08-31 ENCOUNTER — Ambulatory Visit (HOSPITAL_BASED_OUTPATIENT_CLINIC_OR_DEPARTMENT_OTHER): Payer: 59 | Admitting: Physical Therapy

## 2022-08-31 ENCOUNTER — Encounter (HOSPITAL_BASED_OUTPATIENT_CLINIC_OR_DEPARTMENT_OTHER): Payer: Self-pay | Admitting: Physical Therapy

## 2022-08-31 DIAGNOSIS — M62830 Muscle spasm of back: Secondary | ICD-10-CM | POA: Diagnosis not present

## 2022-08-31 DIAGNOSIS — M545 Low back pain, unspecified: Secondary | ICD-10-CM

## 2022-08-31 DIAGNOSIS — M62838 Other muscle spasm: Secondary | ICD-10-CM

## 2022-08-31 DIAGNOSIS — M542 Cervicalgia: Secondary | ICD-10-CM
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
## 2022-09-03 ENCOUNTER — Other Ambulatory Visit (HOSPITAL_COMMUNITY): Payer: Self-pay

## 2022-09-03 ENCOUNTER — Ambulatory Visit (INDEPENDENT_AMBULATORY_CARE_PROVIDER_SITE_OTHER): Payer: 59 | Admitting: Neurology

## 2022-09-03 ENCOUNTER — Encounter: Payer: Self-pay | Admitting: Neurology

## 2022-09-03 VITALS — BP 138/81 | HR 84 | Ht 66.0 in | Wt 140.0 lb

## 2022-09-03 DIAGNOSIS — G43711 Chronic migraine without aura, intractable, with status migrainosus: Secondary | ICD-10-CM

## 2022-09-03 DIAGNOSIS — G43709 Chronic migraine without aura, not intractable, without status migrainosus: Secondary | ICD-10-CM | POA: Diagnosis not present

## 2022-09-03 DIAGNOSIS — F419 Anxiety disorder, unspecified: Secondary | ICD-10-CM

## 2022-09-03 MED ORDER — ONABOTULINUMTOXINA 200 UNITS IJ SOLR
155.0000 [IU] | INTRAMUSCULAR | 0 refills | Status: DC
  Filled 2022-09-03: qty 1, 90d supply, fill #0

## 2022-09-03 MED ORDER — ONDANSETRON 4 MG PO TBDP: 4.0000 mg | ORAL_TABLET | Freq: Three times a day (TID) | ORAL | 6 refills | Status: DC | PRN

## 2022-09-03 MED ORDER — KETOROLAC TROMETHAMINE 60 MG/2ML IM SOLN
60.0000 mg | Freq: Once | INTRAMUSCULAR | Status: AC
  Administered 2022-09-03: 30 mg via INTRAMUSCULAR

## 2022-09-03 MED ORDER — LAMOTRIGINE 25 MG PO TABS: 100.0000 mg | ORAL_TABLET | Freq: Every day | ORAL | 0 refills | Status: DC

## 2022-09-03 MED ORDER — ONABOTULINUMTOXINA 200 UNITS IJ SOLR: 200.0000 [IU] | Freq: Once | INTRAMUSCULAR | Status: DC

## 2022-09-03 MED ORDER — LAMOTRIGINE 100 MG PO TABS: 100.0000 mg | ORAL_TABLET | Freq: Two times a day (BID) | ORAL | 3 refills | Status: DC

## 2022-09-03 MED ORDER — RIZATRIPTAN BENZOATE 10 MG PO TBDP: 10.0000 mg | ORAL_TABLET | ORAL | 11 refills | Status: DC | PRN

## 2022-09-03 NOTE — Telephone Encounter (Signed)
I sent Botox RX to [facility].

## 2022-09-03 NOTE — Progress Notes (Unsigned)
Botox 200 units x 1 vial [id number] Exp-2026-04 [id number] B/b

## 2022-09-03 NOTE — Telephone Encounter (Signed)
I called pharmacy and spoke with Shva[facility], delivery has been scheduled med should be delivered tomorrow.  Will use our stock today and restock tomorrow once received
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
## 2022-09-03 NOTE — Care Plan (Signed)
Administered 30 mg of Toradol left deltoid to pt. Pt tolerated well.   30 mg of Toradol wasted and witnessed by Laury Deep, CMA

## 2022-09-03 NOTE — Addendum Note (Signed)
Addended by: Lester Offerman A on: 09/03/2022 07:35 AM   Modules accepted: Orders
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
## 2022-09-04 ENCOUNTER — Encounter (HOSPITAL_BASED_OUTPATIENT_CLINIC_OR_DEPARTMENT_OTHER): Payer: Self-pay | Admitting: Physical Therapy

## 2022-09-04 ENCOUNTER — Ambulatory Visit (HOSPITAL_BASED_OUTPATIENT_CLINIC_OR_DEPARTMENT_OTHER): Payer: 59 | Admitting: Physical Therapy

## 2022-09-04 DIAGNOSIS — M62838 Other muscle spasm: Secondary | ICD-10-CM

## 2022-09-04 DIAGNOSIS — M62830 Muscle spasm of back: Secondary | ICD-10-CM

## 2022-09-04 DIAGNOSIS — G8929 Other chronic pain: Secondary | ICD-10-CM

## 2022-09-04 DIAGNOSIS — M542 Cervicalgia: Secondary | ICD-10-CM

## 2022-09-05 ENCOUNTER — Ambulatory Visit: Payer: 59 | Attending: Neurology

## 2022-09-05 DIAGNOSIS — R41841 Cognitive communication deficit: Secondary | ICD-10-CM | POA: Insufficient documentation

## 2022-09-05 NOTE — Patient Instructions (Signed)

## 2022-09-06 ENCOUNTER — Encounter (HOSPITAL_BASED_OUTPATIENT_CLINIC_OR_DEPARTMENT_OTHER): Payer: Self-pay | Admitting: Physical Therapy

## 2022-09-06 ENCOUNTER — Ambulatory Visit (HOSPITAL_BASED_OUTPATIENT_CLINIC_OR_DEPARTMENT_OTHER): Payer: 59 | Admitting: Physical Therapy

## 2022-09-06 DIAGNOSIS — F419 Anxiety disorder, unspecified: Secondary | ICD-10-CM | POA: Diagnosis not present

## 2022-09-06 DIAGNOSIS — M62830 Muscle spasm of back: Secondary | ICD-10-CM | POA: Diagnosis not present

## 2022-09-06 DIAGNOSIS — G43711 Chronic migraine without aura, intractable, with status migrainosus: Secondary | ICD-10-CM | POA: Diagnosis not present

## 2022-09-06 DIAGNOSIS — G43709 Chronic migraine without aura, not intractable, without status migrainosus: Secondary | ICD-10-CM | POA: Diagnosis not present

## 2022-09-06 DIAGNOSIS — M542 Cervicalgia: Secondary | ICD-10-CM

## 2022-09-06 DIAGNOSIS — M545 Low back pain, unspecified: Secondary | ICD-10-CM

## 2022-09-06 MED ORDER — ONABOTULINUMTOXINA 100 UNITS IJ SOLR
200.0000 [IU] | Freq: Once | INTRAMUSCULAR | Status: AC
  Administered 2022-09-06: 200 [IU] via INTRAMUSCULAR
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
## 2022-09-10 ENCOUNTER — Ambulatory Visit: Payer: 59

## 2022-09-10 ENCOUNTER — Ambulatory Visit (HOSPITAL_BASED_OUTPATIENT_CLINIC_OR_DEPARTMENT_OTHER): Payer: 59 | Admitting: Physical Therapy

## 2022-09-10 DIAGNOSIS — R41841 Cognitive communication deficit: Secondary | ICD-10-CM
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
## 2022-09-12 ENCOUNTER — Ambulatory Visit: Payer: 59

## 2022-09-12 ENCOUNTER — Encounter (HOSPITAL_BASED_OUTPATIENT_CLINIC_OR_DEPARTMENT_OTHER): Payer: Self-pay | Admitting: Physical Therapy

## 2022-09-12 ENCOUNTER — Ambulatory Visit (HOSPITAL_BASED_OUTPATIENT_CLINIC_OR_DEPARTMENT_OTHER): Payer: 59 | Admitting: Physical Therapy

## 2022-09-12 DIAGNOSIS — G8929 Other chronic pain: Secondary | ICD-10-CM

## 2022-09-12 DIAGNOSIS — M62830 Muscle spasm of back: Secondary | ICD-10-CM | POA: Diagnosis not present

## 2022-09-12 DIAGNOSIS — R41841 Cognitive communication deficit: Secondary | ICD-10-CM

## 2022-09-12 DIAGNOSIS — M62838 Other muscle spasm: Secondary | ICD-10-CM

## 2022-09-12 DIAGNOSIS — M542 Cervicalgia: Secondary | ICD-10-CM
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
## 2022-09-18 ENCOUNTER — Ambulatory Visit (HOSPITAL_BASED_OUTPATIENT_CLINIC_OR_DEPARTMENT_OTHER): Payer: 59 | Admitting: Physical Therapy

## 2022-09-18 ENCOUNTER — Encounter (HOSPITAL_BASED_OUTPATIENT_CLINIC_OR_DEPARTMENT_OTHER): Payer: Self-pay | Admitting: Physical Therapy

## 2022-09-18 DIAGNOSIS — G8929 Other chronic pain: Secondary | ICD-10-CM

## 2022-09-18 DIAGNOSIS — M542 Cervicalgia: Secondary | ICD-10-CM

## 2022-09-18 DIAGNOSIS — M62830 Muscle spasm of back: Secondary | ICD-10-CM | POA: Diagnosis not present

## 2022-09-18 DIAGNOSIS — M62838 Other muscle spasm: Secondary | ICD-10-CM
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
## 2022-09-20 ENCOUNTER — Encounter (HOSPITAL_BASED_OUTPATIENT_CLINIC_OR_DEPARTMENT_OTHER): Payer: Self-pay | Admitting: Physical Therapy

## 2022-09-20 ENCOUNTER — Ambulatory Visit (HOSPITAL_BASED_OUTPATIENT_CLINIC_OR_DEPARTMENT_OTHER): Payer: 59 | Admitting: Physical Therapy

## 2022-09-20 DIAGNOSIS — M542 Cervicalgia: Secondary | ICD-10-CM

## 2022-09-20 DIAGNOSIS — M62838 Other muscle spasm: Secondary | ICD-10-CM

## 2022-09-20 DIAGNOSIS — M62830 Muscle spasm of back: Secondary | ICD-10-CM | POA: Diagnosis not present

## 2022-09-20 DIAGNOSIS — G8929 Other chronic pain: Secondary | ICD-10-CM
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
## 2022-09-25 ENCOUNTER — Ambulatory Visit: Payer: 59

## 2022-09-27 ENCOUNTER — Ambulatory Visit: Payer: 59 | Attending: Neurology

## 2022-09-27 DIAGNOSIS — R41841 Cognitive communication deficit: Secondary | ICD-10-CM | POA: Insufficient documentation
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
## 2022-09-28 ENCOUNTER — Ambulatory Visit: Payer: 59

## 2022-10-02 ENCOUNTER — Telehealth: Payer: Self-pay | Admitting: Neurology

## 2022-10-02 NOTE — Telephone Encounter (Signed)
We will also try and get the pt worked in for a sooner appt with with Dr. Krista Blue
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
## 2022-10-03 ENCOUNTER — Ambulatory Visit: Payer: 59

## 2022-10-05 ENCOUNTER — Ambulatory Visit: Payer: 59

## 2022-10-05 DIAGNOSIS — R41841 Cognitive communication deficit: Secondary | ICD-10-CM | POA: Diagnosis present
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
## 2022-10-07 ENCOUNTER — Telehealth: Payer: Self-pay | Admitting: Neurology

## 2022-10-07 DIAGNOSIS — S199XXS Unspecified injury of neck, sequela: Secondary | ICD-10-CM

## 2022-10-07 DIAGNOSIS — G43711 Chronic migraine without aura, intractable, with status migrainosus: Secondary | ICD-10-CM

## 2022-10-07 NOTE — Telephone Encounter (Signed)
Orders Placed This Encounter  Procedures   AMB referral to sports medicine    To Dr. Larinda Buttery sports medicine

## 2022-10-09 ENCOUNTER — Ambulatory Visit: Payer: 59

## 2022-10-09 DIAGNOSIS — R41841 Cognitive communication deficit: Secondary | ICD-10-CM | POA: Diagnosis not present
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
## 2022-10-10 ENCOUNTER — Ambulatory Visit: Payer: 59

## 2022-10-17 ENCOUNTER — Ambulatory Visit: Payer: 59

## 2022-10-17 DIAGNOSIS — R41841 Cognitive communication deficit: Secondary | ICD-10-CM
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
## 2022-10-19 ENCOUNTER — Encounter (HOSPITAL_BASED_OUTPATIENT_CLINIC_OR_DEPARTMENT_OTHER): Payer: Self-pay | Admitting: Emergency Medicine

## 2022-10-19 ENCOUNTER — Other Ambulatory Visit: Payer: Self-pay

## 2022-10-19 ENCOUNTER — Emergency Department (HOSPITAL_BASED_OUTPATIENT_CLINIC_OR_DEPARTMENT_OTHER): Payer: 59

## 2022-10-19 ENCOUNTER — Emergency Department (HOSPITAL_BASED_OUTPATIENT_CLINIC_OR_DEPARTMENT_OTHER)
Admission: EM | Admit: 2022-10-19 | Discharge: 2022-10-19 | Disposition: A | Discharge status: Home / Self Care | Payer: 59 | Attending: Emergency Medicine | Admitting: Emergency Medicine

## 2022-10-19 ENCOUNTER — Ambulatory Visit: Payer: 59

## 2022-10-19 DIAGNOSIS — M542 Cervicalgia: Secondary | ICD-10-CM | POA: Insufficient documentation

## 2022-10-19 DIAGNOSIS — Z91040 Latex allergy status: Secondary | ICD-10-CM | POA: Diagnosis not present

## 2022-10-19 DIAGNOSIS — W19XXXA Unspecified fall, initial encounter: Secondary | ICD-10-CM | POA: Insufficient documentation

## 2022-10-19 DIAGNOSIS — R519 Headache, unspecified: Secondary | ICD-10-CM

## 2022-10-19 DIAGNOSIS — S0083XA Contusion of other part of head, initial encounter: Secondary | ICD-10-CM | POA: Diagnosis not present

## 2022-10-19 DIAGNOSIS — S0990XA Unspecified injury of head, initial encounter: Secondary | ICD-10-CM | POA: Diagnosis present

## 2022-10-19 MED ORDER — IBUPROFEN 800 MG PO TABS: 800.0000 mg | ORAL_TABLET | Freq: Three times a day (TID) | ORAL | 0 refills | Status: DC

## 2022-10-19 MED ORDER — KETOROLAC TROMETHAMINE 60 MG/2ML IM SOLN
60.0000 mg | Freq: Once | INTRAMUSCULAR | Status: AC
  Administered 2022-10-19: 60 mg via INTRAMUSCULAR
  Filled 2022-10-19: qty 2

## 2022-10-19 MED ORDER — PREDNISONE 20 MG PO TABS: ORAL_TABLET | ORAL | 0 refills | Status: DC
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
## 2022-10-19 NOTE — Discharge Instructions (Addendum)
1.  Your CT scan of your head and your jaw do not show any acute injuries from trauma.  Have a bruise on your chin and sore jaw muscles.  Apply well wrapped ice packs in the areas of swelling.  Take your regular pain medications as needed for pain control.  You may take anti-inflammatory such as ibuprofen for headache and jaw pain. 2.  You raise the possibility of bad headache from repeat concussions.  This should be discussed with your neurologist and possibly referral to a specialty clinic.  You do have a long history of migraine headaches that have been severe as well, it will require some specialist evaluation to determine if your headaches are your migraines versus the cumulative effect of head injuries. 3.  Return to emergency department if you have any changes in your vision such as double vision, loss of vision, if you develop fever, vomiting and cannot tolerate medications or are becoming dehydrated, or other concerning changes.
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
## 2022-10-23 ENCOUNTER — Ambulatory Visit: Payer: 59

## 2022-10-24 ENCOUNTER — Ambulatory Visit (INDEPENDENT_AMBULATORY_CARE_PROVIDER_SITE_OTHER): Payer: 59 | Admitting: Family Medicine

## 2022-10-24 VITALS — BP 112/70 | HR 76 | Ht 66.0 in | Wt 144.0 lb

## 2022-10-24 DIAGNOSIS — G43719 Chronic migraine without aura, intractable, without status migrainosus: Secondary | ICD-10-CM | POA: Diagnosis not present

## 2022-10-24 DIAGNOSIS — G8929 Other chronic pain: Secondary | ICD-10-CM

## 2022-10-24 DIAGNOSIS — F0781 Postconcussional syndrome: Secondary | ICD-10-CM | POA: Diagnosis not present

## 2022-10-24 DIAGNOSIS — R4189 Other symptoms and signs involving cognitive functions and awareness: Secondary | ICD-10-CM

## 2022-10-24 DIAGNOSIS — R42 Dizziness and giddiness: Secondary | ICD-10-CM

## 2022-10-24 DIAGNOSIS — M542 Cervicalgia: Secondary | ICD-10-CM

## 2022-10-24 MED ORDER — FLUOXETINE HCL 10 MG PO CAPS: ORAL_CAPSULE | ORAL | 0 refills | Status: DC

## 2022-10-24 MED ORDER — SCOPOLAMINE 1 MG/3DAYS TD PT72: 1.0000 | MEDICATED_PATCH | TRANSDERMAL | 12 refills | Status: AC
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
## 2022-10-24 NOTE — Patient Instructions (Signed)
Thank you for coming in today.   Refer to vestibular PT.   Try scopolamine patches.   Start prozac 10 mg daily and increase to 20 in 2 weeks.   Recheck in 3 weeks.   Let me know where you want to go for myofascial release treatment etc.   If you need a letter or a statement let me know.   [facility] Dr Dellia Beckwith is pretty good.

## 2022-10-31 ENCOUNTER — Ambulatory Visit: Payer: 59 | Attending: Neurology

## 2022-10-31 DIAGNOSIS — R41841 Cognitive communication deficit: Secondary | ICD-10-CM | POA: Insufficient documentation

## 2022-10-31 NOTE — Therapy (Signed)
[facility] [address] [place], [place], [address] Phone: [phone]   Fax:  [phone]  Patient Details  Name: [name] [name] MRN: [id number] Date of Birth: January 17, 1976 Referring Provider:    Encounter Date: 10/31/2022  Speech Therapy - No Show SLP called pt and she realized that she missed appt with SLP this morning.  SLP informed pt of no-show policy. Pt rescheduled her missed appointment.    [place], [place] 10/31/2022, 10:21 AM  [facility] [address], [place], [place], [address] Phone: [phone]   Fax:  [phone]

## 2022-11-06 ENCOUNTER — Ambulatory Visit: Payer: 59

## 2022-11-07 ENCOUNTER — Ambulatory Visit: Payer: 59

## 2022-11-07 ENCOUNTER — Encounter: Payer: Self-pay | Admitting: Family Medicine

## 2022-11-07 DIAGNOSIS — R41841 Cognitive communication deficit: Secondary | ICD-10-CM | POA: Diagnosis present
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
## 2022-11-08 NOTE — Telephone Encounter (Signed)
Could you please verify if this should be SP or B/B? Notes indicate that previously med was ordered from [facility] but the appointment notes are stating B/B.

## 2022-11-13 ENCOUNTER — Ambulatory Visit: Payer: 59

## 2022-11-13 DIAGNOSIS — R41841 Cognitive communication deficit: Secondary | ICD-10-CM
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
## 2022-11-20 ENCOUNTER — Encounter: Payer: 59 | Admitting: Family Medicine

## 2022-11-21 ENCOUNTER — Other Ambulatory Visit (HOSPITAL_COMMUNITY): Payer: Self-pay

## 2022-11-23 ENCOUNTER — Other Ambulatory Visit (HOSPITAL_COMMUNITY): Payer: Self-pay

## 2022-11-27 ENCOUNTER — Telehealth: Payer: Self-pay | Admitting: Neurology

## 2022-11-27 ENCOUNTER — Other Ambulatory Visit (HOSPITAL_COMMUNITY): Payer: Self-pay

## 2022-11-27 NOTE — Telephone Encounter (Signed)
I am not sure who Lonn Georgia is ? I reviewed the note and the plan I have from Dr. Krista Blue states to continue botox. I guess it would be up to the pt at this point?  Thanks

## 2022-11-27 NOTE — Telephone Encounter (Signed)
Sent message to pt to ask about botox appt.

## 2022-11-27 NOTE — Telephone Encounter (Signed)
I reached out to [facility] to see when pt's Botox would come for 2/8 appt, see below message I received back: last note we have on this pt is that she stopped botox due to injuries and is using other therapies per kayla at [facility]  Do you all have any info on this?

## 2022-11-27 NOTE — Telephone Encounter (Signed)
Tried calling pt to discuss, no answer and VM box full.

## 2022-11-28 ENCOUNTER — Other Ambulatory Visit: Payer: Self-pay

## 2022-11-28 ENCOUNTER — Other Ambulatory Visit (HOSPITAL_COMMUNITY): Payer: Self-pay

## 2022-11-28 ENCOUNTER — Ambulatory Visit: Payer: 59 | Attending: Neurology

## 2022-11-28 ENCOUNTER — Ambulatory Visit: Payer: 59 | Admitting: Neurology

## 2022-11-28 DIAGNOSIS — R41841 Cognitive communication deficit: Secondary | ICD-10-CM | POA: Insufficient documentation

## 2022-11-28 MED ORDER — ONABOTULINUMTOXINA 200 UNITS IJ SOLR
INTRAMUSCULAR | 0 refills | Status: DC
  Filled 2022-11-28 – 2022-12-04 (×2): qty 1, 84d supply, fill #0

## 2022-11-28 NOTE — Telephone Encounter (Signed)
Returned pt's call. Informed her that she needs to call [facility] to tell them she does want to continue Botox so they can arrange shipment. I gave her their number and she stated she would call them today.

## 2022-11-28 NOTE — Telephone Encounter (Signed)
Done, thanks
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
## 2022-11-28 NOTE — Telephone Encounter (Signed)
Can you please send Botox Rx to [facility] again? We will have to use office stock for tomorrow's appointment and they will send [place] the replacement.

## 2022-11-28 NOTE — Telephone Encounter (Signed)
Pt is asking for a call back re: her Botox appointment.  Pt states she plans to still come to appointment

## 2022-11-28 NOTE — Telephone Encounter (Signed)
Left a VM asking pt to call or message [place], sent MyChart message as well.

## 2022-11-29 ENCOUNTER — Encounter: Payer: Self-pay | Admitting: Neurology

## 2022-11-29 ENCOUNTER — Other Ambulatory Visit (HOSPITAL_COMMUNITY): Payer: Self-pay

## 2022-11-29 ENCOUNTER — Ambulatory Visit (INDEPENDENT_AMBULATORY_CARE_PROVIDER_SITE_OTHER): Payer: 59 | Admitting: Neurology

## 2022-11-29 VITALS — BP 138/72 | HR 88 | Ht 67.0 in | Wt 140.0 lb

## 2022-11-29 DIAGNOSIS — G43711 Chronic migraine without aura, intractable, with status migrainosus: Secondary | ICD-10-CM

## 2022-11-29 MED ORDER — ONABOTULINUMTOXINA 200 UNITS IJ SOLR: 155.0000 [IU] | Freq: Once | INTRAMUSCULAR | Status: AC

## 2022-11-29 NOTE — Progress Notes (Signed)
Botox- 200 units x 1 vial Lot: [id number] Expiration: 06.2026 NDC: 0023-3921-02  Bacteriostatic 0.9% Sodium Chloride- 88m total Lot: [id number]: 03.08.2024 NDC: [id number] Dx: g43.711 S/P

## 2022-11-29 NOTE — Progress Notes (Signed)
  Botox injection for chronic migraine prevention, injection was performed according to [place] protocol,  5 units of Botox was injected into each side, for 31 injection sites, total of 155 units  Bilateral frontalis 4 injection sites Bilateral corrugate 2 injection sites Procerus 1 injection sites. Bilateral temporalis 8 injection sites Bilateral occipitalis 6 injection sites Bilateral cervical paraspinals 4 injection sites Bilateral upper trapezius 6 injection sites  Extra 45 unites were injected into bilateral levator scapular and cervical paraspinal muscles  She tolerated injection well will return to clinic in 3 months for repeat injection

## 2022-11-30 ENCOUNTER — Other Ambulatory Visit (HOSPITAL_COMMUNITY): Payer: Self-pay

## 2022-12-03 ENCOUNTER — Telehealth: Payer: Self-pay

## 2022-12-03 ENCOUNTER — Other Ambulatory Visit (HOSPITAL_COMMUNITY): Payer: Self-pay

## 2022-12-03 NOTE — Telephone Encounter (Signed)
Pharmacy Patient Advocate Encounter   Received notification from [facility] that prior authorization for Botox 200 Units is required/requested.   PA submitted on 12/03/2022 to (ins) [employer] via CoverMyMeds Key [id number]  Status is pending   [employer]-Benefit Verification [id number] Submitted!

## 2022-12-04 ENCOUNTER — Other Ambulatory Visit (HOSPITAL_COMMUNITY): Payer: Self-pay

## 2022-12-04 ENCOUNTER — Other Ambulatory Visit: Payer: Self-pay

## 2022-12-04 NOTE — Telephone Encounter (Signed)
Pharmacy Patient Advocate Encounter  Prior Authorization for Botox 200UNIT solution has been approved.    PA# PA Case ID: [id number] Effective dates: 12/03/2022 through 03/04/2023 Per Test Billing copay is zero and can be filled with [facility].
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
## 2022-12-06 ENCOUNTER — Ambulatory Visit: Payer: 59

## 2022-12-06 NOTE — Telephone Encounter (Signed)
Received replacement (1) 200 unit Botox from [facility].

## 2022-12-06 NOTE — Telephone Encounter (Signed)
Noted thank you

## 2022-12-11 ENCOUNTER — Other Ambulatory Visit (HOSPITAL_COMMUNITY): Payer: Self-pay

## 2022-12-13 ENCOUNTER — Ambulatory Visit: Payer: 59

## 2022-12-13 DIAGNOSIS — R41841 Cognitive communication deficit: Secondary | ICD-10-CM

## 2022-12-13 NOTE — Patient Instructions (Addendum)
Car Registration To-Do List:  Check Registration paperwork for title number  Attempt to complete registration online If that doesn't work, try the [facility]  Make an appointment - call or try to schedule online
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
## 2022-12-18 ENCOUNTER — Ambulatory Visit (HOSPITAL_BASED_OUTPATIENT_CLINIC_OR_DEPARTMENT_OTHER): Payer: 59 | Attending: Orthopedic Surgery | Admitting: Physical Therapy

## 2022-12-18 ENCOUNTER — Other Ambulatory Visit: Payer: Self-pay

## 2022-12-18 ENCOUNTER — Encounter (HOSPITAL_BASED_OUTPATIENT_CLINIC_OR_DEPARTMENT_OTHER): Payer: Self-pay | Admitting: Physical Therapy

## 2022-12-18 DIAGNOSIS — M542 Cervicalgia: Secondary | ICD-10-CM | POA: Insufficient documentation

## 2022-12-18 DIAGNOSIS — G8929 Other chronic pain: Secondary | ICD-10-CM | POA: Insufficient documentation

## 2022-12-18 DIAGNOSIS — R42 Dizziness and giddiness: Secondary | ICD-10-CM | POA: Diagnosis present

## 2022-12-18 DIAGNOSIS — M545 Low back pain, unspecified: Secondary | ICD-10-CM | POA: Diagnosis present

## 2022-12-18 DIAGNOSIS — M62838 Other muscle spasm: Secondary | ICD-10-CM | POA: Insufficient documentation
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
## 2022-12-20 ENCOUNTER — Ambulatory Visit: Payer: 59
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
## 2022-12-27 ENCOUNTER — Ambulatory Visit: Payer: 59 | Attending: Neurology

## 2022-12-27 DIAGNOSIS — R41841 Cognitive communication deficit: Secondary | ICD-10-CM

## 2022-12-31 ENCOUNTER — Ambulatory Visit: Payer: 59

## 2023-01-30 ENCOUNTER — Ambulatory Visit (INDEPENDENT_AMBULATORY_CARE_PROVIDER_SITE_OTHER): Payer: 59 | Admitting: Family Medicine

## 2023-01-30 VITALS — BP 110/68 | HR 97 | Ht 67.0 in | Wt 148.0 lb

## 2023-01-30 DIAGNOSIS — R4189 Other symptoms and signs involving cognitive functions and awareness: Secondary | ICD-10-CM | POA: Diagnosis not present

## 2023-01-30 DIAGNOSIS — M5481 Occipital neuralgia: Secondary | ICD-10-CM | POA: Diagnosis not present

## 2023-01-30 DIAGNOSIS — F0781 Postconcussional syndrome: Secondary | ICD-10-CM

## 2023-01-30 DIAGNOSIS — R42 Dizziness and giddiness: Secondary | ICD-10-CM | POA: Diagnosis not present
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
## 2023-01-30 NOTE — Patient Instructions (Addendum)
Thank you for coming in today.   Plan for referral to Vestibular PT.   Plan for referral to Dr Lorrine Kin.   We can do Neuro-opthomology if [facility] [employer] cannot get you better enough with PT.  [employer].   Continue ADHD medications.   Get me your sleep study. We can try a CPAP machine.   EMDR could help with PTSD.   Recheck in 4 weeks. Virtual visit is OK.

## 2023-02-07 ENCOUNTER — Other Ambulatory Visit: Payer: Self-pay

## 2023-02-07 ENCOUNTER — Encounter: Payer: Self-pay | Admitting: Family Medicine

## 2023-02-07 DIAGNOSIS — R4189 Other symptoms and signs involving cognitive functions and awareness: Secondary | ICD-10-CM

## 2023-02-07 DIAGNOSIS — F0781 Postconcussional syndrome: Secondary | ICD-10-CM

## 2023-02-12 ENCOUNTER — Other Ambulatory Visit (HOSPITAL_COMMUNITY): Payer: Self-pay

## 2023-02-13 ENCOUNTER — Ambulatory Visit: Payer: 59 | Admitting: Physical Therapy

## 2023-02-15 ENCOUNTER — Other Ambulatory Visit (HOSPITAL_COMMUNITY): Payer: Self-pay

## 2023-02-18 ENCOUNTER — Other Ambulatory Visit (HOSPITAL_COMMUNITY): Payer: Self-pay

## 2023-02-18 ENCOUNTER — Other Ambulatory Visit: Payer: Self-pay

## 2023-02-18 ENCOUNTER — Telehealth: Payer: Self-pay | Admitting: Neurology

## 2023-02-18 NOTE — Telephone Encounter (Signed)
[facility] needs pt to call them back to set up shipment for 5/8 Botox appt. LVM and sent MyChart message asking pt to call.
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
## 2023-02-19 ENCOUNTER — Ambulatory Visit: Payer: 59 | Attending: Neurology | Admitting: Physical Therapy

## 2023-02-19 ENCOUNTER — Encounter: Payer: Self-pay | Admitting: Physical Therapy

## 2023-02-19 DIAGNOSIS — R42 Dizziness and giddiness: Secondary | ICD-10-CM | POA: Diagnosis present

## 2023-02-19 DIAGNOSIS — G8929 Other chronic pain: Secondary | ICD-10-CM | POA: Diagnosis present

## 2023-02-19 DIAGNOSIS — G44329 Chronic post-traumatic headache, not intractable: Secondary | ICD-10-CM

## 2023-02-19 DIAGNOSIS — M542 Cervicalgia: Secondary | ICD-10-CM

## 2023-02-19 DIAGNOSIS — F0781 Postconcussional syndrome: Secondary | ICD-10-CM | POA: Insufficient documentation

## 2023-02-19 DIAGNOSIS — M545 Low back pain, unspecified: Secondary | ICD-10-CM | POA: Diagnosis present

## 2023-02-20 ENCOUNTER — Telehealth (INDEPENDENT_AMBULATORY_CARE_PROVIDER_SITE_OTHER): Payer: 59 | Admitting: Family Medicine

## 2023-02-20 DIAGNOSIS — F0781 Postconcussional syndrome: Secondary | ICD-10-CM

## 2023-02-20 MED ORDER — LIDOCAINE 4 % EX PTCH: 1.0000 | MEDICATED_PATCH | CUTANEOUS | 1 refills | Status: AC

## 2023-02-20 MED ORDER — IBUPROFEN 800 MG PO TABS: 800.0000 mg | ORAL_TABLET | Freq: Three times a day (TID) | ORAL | 1 refills | Status: DC
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
## 2023-02-25 ENCOUNTER — Ambulatory Visit: Payer: 59 | Attending: Neurology | Admitting: Physical Therapy

## 2023-02-25 ENCOUNTER — Encounter: Payer: Self-pay | Admitting: Physical Therapy

## 2023-02-25 DIAGNOSIS — R42 Dizziness and giddiness: Secondary | ICD-10-CM | POA: Diagnosis present

## 2023-02-25 DIAGNOSIS — G44329 Chronic post-traumatic headache, not intractable: Secondary | ICD-10-CM | POA: Diagnosis present

## 2023-02-25 DIAGNOSIS — M542 Cervicalgia: Secondary | ICD-10-CM | POA: Insufficient documentation

## 2023-02-26 ENCOUNTER — Encounter: Payer: Self-pay | Admitting: Neurology

## 2023-02-26 NOTE — Telephone Encounter (Signed)
Cancelled 5/8 Botox appt- pt has not reached out to [facility] to schedule delivery. We also have not been able to contact the pt.

## 2023-02-27 ENCOUNTER — Ambulatory Visit: Payer: 59 | Admitting: Neurology

## 2023-02-27 ENCOUNTER — Other Ambulatory Visit: Payer: Self-pay | Admitting: Neurology

## 2023-02-27 ENCOUNTER — Ambulatory Visit (INDEPENDENT_AMBULATORY_CARE_PROVIDER_SITE_OTHER): Payer: 59 | Admitting: Neurology

## 2023-02-27 ENCOUNTER — Other Ambulatory Visit (HOSPITAL_COMMUNITY): Payer: Self-pay

## 2023-02-27 ENCOUNTER — Other Ambulatory Visit: Payer: Self-pay

## 2023-02-27 VITALS — BP 109/65

## 2023-02-27 DIAGNOSIS — G43709 Chronic migraine without aura, not intractable, without status migrainosus: Secondary | ICD-10-CM

## 2023-02-27 DIAGNOSIS — G43711 Chronic migraine without aura, intractable, with status migrainosus: Secondary | ICD-10-CM

## 2023-02-27 MED ORDER — ONABOTULINUMTOXINA 200 UNITS IJ SOLR
INTRAMUSCULAR | 0 refills | Status: DC
  Filled 2023-02-27 (×2): qty 1, 90d supply, fill #0

## 2023-02-27 MED ORDER — ONABOTULINUMTOXINA 200 UNITS IJ SOLR
155.0000 [IU] | Freq: Once | INTRAMUSCULAR | Status: AC
Start: 2023-02-27 — End: ?

## 2023-02-27 NOTE — Progress Notes (Signed)
  Botox injection for chronic migraine prevention, injection was performed according to [place] protocol,  5 units of Botox was injected into each side, for 31 injection sites, total of 155 units  Bilateral frontalis 4 injection sites Bilateral corrugate 2 injection sites Procerus 1 injection sites. Bilateral temporalis 8 injection sites Bilateral occipitalis 6 injection sites Bilateral cervical paraspinals 4 injection sites Bilateral upper trapezius 6 injection sites  Extra 45 unites were injected into bilateral levator scapular and cervical paraspinal muscles  She tolerated injection well will return to clinic in 3 months for repeat injection 

## 2023-02-27 NOTE — Progress Notes (Signed)
Botox- 200 units x 1 vial Lot: [id number] Expiration: 06.2026 NDC: 0023-3921-02  Bacteriostatic 0.9% Sodium Chloride- * mL  Lot: [id number] Expiration: 11.2025 NDC: 45409-811-91  Dx: G43.711 S/P Witnessed by April Jones, RN

## 2023-02-27 NOTE — Telephone Encounter (Signed)
This is the first I've been told about a refill and we don't have anything on the fax machine from [facility]. Please send a refill for Botox and specify [facility] outpatient pharmacy on the refill.

## 2023-02-27 NOTE — Telephone Encounter (Signed)
See 4/29 encounter for more information.
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
## 2023-03-06 ENCOUNTER — Encounter: Payer: Self-pay | Admitting: Physical Therapy

## 2023-03-06 ENCOUNTER — Ambulatory Visit: Payer: 59 | Admitting: Physical Therapy

## 2023-03-06 DIAGNOSIS — R42 Dizziness and giddiness: Secondary | ICD-10-CM | POA: Diagnosis not present

## 2023-03-06 DIAGNOSIS — G44329 Chronic post-traumatic headache, not intractable: Secondary | ICD-10-CM

## 2023-03-07 ENCOUNTER — Ambulatory Visit (INDEPENDENT_AMBULATORY_CARE_PROVIDER_SITE_OTHER): Payer: 59 | Admitting: Podiatry

## 2023-03-07 DIAGNOSIS — M21621 Bunionette of right foot: Secondary | ICD-10-CM | POA: Diagnosis not present

## 2023-03-07 DIAGNOSIS — M21622 Bunionette of left foot: Secondary | ICD-10-CM | POA: Diagnosis not present

## 2023-03-07 DIAGNOSIS — I7300 Raynaud's syndrome without gangrene: Secondary | ICD-10-CM

## 2023-03-07 DIAGNOSIS — L600 Ingrowing nail: Secondary | ICD-10-CM

## 2023-03-07 NOTE — Progress Notes (Signed)
  Subjective:  Patient ID: [name] [name], female    DOB: Aug 10, 1976,  MRN: [id number]  Chief Complaint  Patient presents with   Ingrown Toenail    Left lateral   Bunions    Discuss Tailor's bunion surgery    47 y.o. female presents with the above complaint. History confirmed with patient.  She returns for follow-up, her tailor's bunions are still occasional bothersome.  She is still compensating surgery.  She wants to have the left hallux checked to see if there is an ingrown.  Also a few months ago the toes began to turn purple but has gone away  Objective:  Physical Exam: warm, good capillary refill, no trophic changes or ulcerative lesions, normal DP and PT pulses, and normal sensory exam. Left Foot:  Tailor's bunion deformity, dry skin on lateral border of hallux, no signs of infection no pain to palpation Right Foot:  Tailor's bunion deformity, matricectomy sites are well-healed  No images are attached to the encounter.  Radiographs: Multiple views x-ray of the right foot: no fracture, dislocation, swelling or degenerative changes noted of the fifth toe she does have a tailor's bunion deformity Assessment:   1. Ingrown nail   2. Tailor's bunionette, bilateral   3. Raynaud's phenomenon without gangrene      Plan:  Patient was evaluated and treated and all questions answered.  We again feel surgical treatment of the tailor's bunionectomy.  She will let me know if she is ready to proceed with this at some point.  We also discussed orthotics which I do not think necessarily will offer relief only tailor's bunion but may give good arch support and offloading of the metatarsal area.  She will let me know if she is ready to proceed with these.  Suspect she had developed Raynaud's phenomenon over the winter.  We discussed the treatment of this and etiology and importance of keeping the toes warm.  Seems to have resolved now with warmer weather  Her left hallux lateral border did  not have deep ingrown pain or signs of infection.  She will monitor this and let me know if it requires avulsion or matricectomy like we did on her other side.   Return if symptoms worsen or fail to improve.

## 2023-03-07 NOTE — Patient Instructions (Signed)
The procedure code for custom molded orthotics is L3020
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
## 2023-03-14 ENCOUNTER — Ambulatory Visit: Payer: 59 | Admitting: Physical Therapy

## 2023-03-14 ENCOUNTER — Encounter: Payer: Self-pay | Admitting: Physical Therapy

## 2023-03-14 DIAGNOSIS — G44329 Chronic post-traumatic headache, not intractable: Secondary | ICD-10-CM

## 2023-03-14 DIAGNOSIS — R42 Dizziness and giddiness: Secondary | ICD-10-CM | POA: Diagnosis not present
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
## 2023-03-21 ENCOUNTER — Ambulatory Visit: Payer: 59 | Admitting: Physical Therapy

## 2023-03-21 ENCOUNTER — Encounter: Payer: Self-pay | Admitting: Physical Therapy

## 2023-03-21 DIAGNOSIS — M542 Cervicalgia: Secondary | ICD-10-CM

## 2023-03-21 DIAGNOSIS — R42 Dizziness and giddiness: Secondary | ICD-10-CM

## 2023-03-21 DIAGNOSIS — G44329 Chronic post-traumatic headache, not intractable: Secondary | ICD-10-CM

## 2023-03-21 NOTE — Patient Instructions (Signed)
Neuro-ophthalmology or Neuro-optometrists in the area   [facility] Dr. Aura Camps [address], [place], [place] [address] [phone]   [facility] [address] [place], [place] [address] [phone]   Dory Larsen, FCOVD Neurodevelopmental Optometrist [phone]

## 2023-03-27 ENCOUNTER — Ambulatory Visit (INDEPENDENT_AMBULATORY_CARE_PROVIDER_SITE_OTHER): Payer: 59 | Admitting: Family Medicine

## 2023-03-27 ENCOUNTER — Other Ambulatory Visit: Payer: Self-pay

## 2023-03-27 VITALS — BP 116/80 | HR 102 | Ht 67.0 in | Wt 145.2 lb

## 2023-03-27 DIAGNOSIS — M25562 Pain in left knee: Secondary | ICD-10-CM

## 2023-03-27 DIAGNOSIS — M2392 Unspecified internal derangement of left knee: Secondary | ICD-10-CM | POA: Diagnosis not present

## 2023-03-27 MED ORDER — LORAZEPAM 0.5 MG PO TABS: ORAL_TABLET | ORAL | 0 refills | Status: DC

## 2023-03-27 NOTE — Progress Notes (Signed)
   I, Stevenson Clinch, CMA acting as a scribe for [name], MD.  [name] [name] is a 47 y.o. female who presents to [facility] today for f/u post-concussion symptoms and L knee pain. Pt's last visit w/ Dr. Denyse Amass was virtual and she was advised to cont vestibular therapy, completing 6 visits. She was also referred to neuro-ophthalmology and advised to try the ketamine as prescribe by PCP and discussed options for EMDR. She has also remained out of work.  Has not been able to start with Ketamine treatments or EMDR.   Today, pt reports left knee pain since Sept 2024. Mechanical sx present with knee flexion. Felt and heard a loud pop while at PT last week. Has been unable to WB since then. Has tried rest, ice, elevation, IBU, and bracing. Swelling present, worse at the time of injury. Pain primarily at medial aspect of the knee but the pop that happened at PT was at the lateral aspect of the knee radiating from the hip to the ankle.   L Knee swelling: yes Mechanical symptoms: yes Aggravates: flexion/extension Treatments tried: IBU, compression  Pertinent review of systems: No fevers or chills  Relevant historical information: Chronic migraine   Exam:  BP 116/80   Pulse (!) 102   Ht 5\' 7"  (1.702 m)   Wt 145 lb 3.2 oz (65.9 kg)   SpO2 96%   BMI 22.74 kg/m  General: Well Developed, well nourished, and in no acute distress.   MSK: Knee: Mild effusion otherwise normal. Tender palpation medial and lateral joint line. Very limited range of motion unable to flex the knee beyond 80 degrees. Significant guarding with ligament exam testing nondiagnostic. Very positive medial McMurray's test. Limited strength extension and flexion limited by pain.    Lab and Radiology Results  X-ray obtained at [facility] reportedly mild DJD.    Assessment and Plan: 47 y.o. female with left knee pain thought to be due to significant meniscus tear.  Doubtful for  tendon injury but there may be a ligament injury such as ACL as well.  She had significant loud pop and now has significant mechanical symptoms and pain and inability to move normally.  Plan for MRI to further characterize source of pain.   PDMP not reviewed this encounter. Orders Placed This Encounter  Procedures   MR Knee Left  Wo Contrast    Standing Status:   Future    Standing Expiration Date:   03/26/2024    Order Specific Question:   What is the patient's sedation requirement?    Answer:   Anti-anxiety    Order Specific Question:   Does the patient have a pacemaker or implanted devices?    Answer:   No    Order Specific Question:   Preferred imaging location?    Answer:   [facility]   Meds ordered this encounter  Medications   LORazepam (ATIVAN) 0.5 MG tablet    Sig: 1-2 tabs 30 - 60 min prior to MRI. Do not drive with this medicine.    Dispense:  4 tablet    Refill:  0     Discussed warning signs or symptoms. Please see discharge instructions. Patient expresses understanding.   The above documentation has been reviewed and is accurate and complete [name], M.D.

## 2023-03-27 NOTE — Patient Instructions (Signed)
Thank you for coming in today.   You should hear from MRI scheduling within 1 week. If you do not hear please let me know.    Let me know if you do not hear about that MRI.   Try to send me your xray report from [facility].

## 2023-04-04 ENCOUNTER — Ambulatory Visit (HOSPITAL_BASED_OUTPATIENT_CLINIC_OR_DEPARTMENT_OTHER)
Admission: RE | Admit: 2023-04-04 | Discharge: 2023-04-04 | Disposition: A | Discharge status: Home / Self Care | Payer: 59 | Source: Ambulatory Visit | Attending: Family Medicine | Admitting: Family Medicine

## 2023-04-04 DIAGNOSIS — M25562 Pain in left knee: Secondary | ICD-10-CM | POA: Diagnosis present

## 2023-04-04 DIAGNOSIS — M2392 Unspecified internal derangement of left knee: Secondary | ICD-10-CM | POA: Diagnosis present

## 2023-04-11 ENCOUNTER — Telehealth: Payer: Self-pay | Admitting: Family Medicine

## 2023-04-11 NOTE — Telephone Encounter (Signed)
Pt called about delay in MRI results. We called radiology to expedite reading. Pt wanted to start PT referral to [facility] but advised we may want to see MRI results first. Advised pt to look out for msg from Dr. Denyse Amass once MRI read.

## 2023-04-11 NOTE — Telephone Encounter (Signed)
MRI has been resulted. Forwarding to Dr. Denyse Amass to review and comment on MRI report.

## 2023-04-12 ENCOUNTER — Other Ambulatory Visit: Payer: Self-pay

## 2023-04-12 DIAGNOSIS — M25562 Pain in left knee: Secondary | ICD-10-CM

## 2023-04-12 NOTE — Telephone Encounter (Signed)
Result note sent to patient this morning.

## 2023-04-12 NOTE — Progress Notes (Signed)
Knee MRI does show meniscus tear that we are worried about.  You do have some arthritis in your knee but not terrible.  This may be worthwhile having a surgical consultation to discuss your surgical options especially with your mechanical symptoms.  Would you like me to place a referral now?

## 2023-04-12 NOTE — Telephone Encounter (Signed)
Patient called back stating that she would like to go ahead and start PT asap. She mentioned PT at [facility] or [facility]?  Please advise.

## 2023-04-15 NOTE — Telephone Encounter (Signed)
[name] [name] 04/12/2023  9:36 AM EDT     Pt viewed Dr. Zollie Pee result note via MyChart. Called and ask pt about surgical referral and she requested Dr. Teryl Lucy at [facility]. Referral placed.

## 2023-04-15 NOTE — Telephone Encounter (Signed)
Per MRI Imaging notes, pt referred for surgical consult at [facility].

## 2023-04-22 ENCOUNTER — Encounter: Payer: Self-pay | Admitting: Family Medicine

## 2023-04-30 ENCOUNTER — Other Ambulatory Visit: Payer: Self-pay

## 2023-04-30 DIAGNOSIS — M25562 Pain in left knee: Secondary | ICD-10-CM
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
## 2023-05-02 ENCOUNTER — Other Ambulatory Visit: Payer: Self-pay

## 2023-05-02 ENCOUNTER — Ambulatory Visit: Payer: 59 | Attending: Family Medicine

## 2023-05-02 DIAGNOSIS — M6281 Muscle weakness (generalized): Secondary | ICD-10-CM | POA: Diagnosis present

## 2023-05-02 DIAGNOSIS — M25562 Pain in left knee: Secondary | ICD-10-CM | POA: Insufficient documentation

## 2023-05-02 DIAGNOSIS — R2689 Other abnormalities of gait and mobility: Secondary | ICD-10-CM | POA: Insufficient documentation

## 2023-05-02 DIAGNOSIS — G8929 Other chronic pain: Secondary | ICD-10-CM | POA: Diagnosis present

## 2023-05-10 ENCOUNTER — Other Ambulatory Visit: Payer: Self-pay

## 2023-05-14 ENCOUNTER — Encounter (HOSPITAL_BASED_OUTPATIENT_CLINIC_OR_DEPARTMENT_OTHER): Payer: Self-pay | Admitting: Physical Therapy

## 2023-05-16 ENCOUNTER — Other Ambulatory Visit (HOSPITAL_COMMUNITY): Payer: Self-pay

## 2023-05-16 ENCOUNTER — Other Ambulatory Visit: Payer: Self-pay

## 2023-05-16 ENCOUNTER — Telehealth: Payer: Self-pay | Admitting: Neurology

## 2023-05-16 DIAGNOSIS — M25562 Pain in left knee: Secondary | ICD-10-CM

## 2023-05-16 NOTE — Progress Notes (Signed)
amb  

## 2023-05-16 NOTE — Telephone Encounter (Signed)
Received message from [employer] @ [facility] pharmacy. She states that pt told them she no longer was interested in filling her Botox through them because she "does not want to receive phone calls to fill". As noted in previous 02/18/23 encounter, we have had issues before getting the patient to cooperate with the specialty pharmacy. Any specialty pharmacy will require speaking with the patient before shipping out medication, so we will likely run into more problems if we send Botox script elsewhere.  I went ahead and submitted an auth request via [facility] portal for pt to be buy/bill, received instant approval.   Auth#: [id number] (05/16/23-05/15/24)

## 2023-05-17 ENCOUNTER — Encounter (HOSPITAL_BASED_OUTPATIENT_CLINIC_OR_DEPARTMENT_OTHER): Payer: Self-pay

## 2023-05-22 ENCOUNTER — Other Ambulatory Visit: Payer: Self-pay

## 2023-05-22 ENCOUNTER — Other Ambulatory Visit (HOSPITAL_COMMUNITY): Payer: Self-pay

## 2023-05-23 ENCOUNTER — Ambulatory Visit (HOSPITAL_BASED_OUTPATIENT_CLINIC_OR_DEPARTMENT_OTHER): Payer: 59 | Admitting: Physical Therapy

## 2023-05-24 ENCOUNTER — Ambulatory Visit (HOSPITAL_BASED_OUTPATIENT_CLINIC_OR_DEPARTMENT_OTHER): Payer: 59 | Attending: Family Medicine | Admitting: Physical Therapy

## 2023-05-24 ENCOUNTER — Encounter (HOSPITAL_BASED_OUTPATIENT_CLINIC_OR_DEPARTMENT_OTHER): Payer: Self-pay | Admitting: Physical Therapy

## 2023-05-24 DIAGNOSIS — M25562 Pain in left knee: Secondary | ICD-10-CM | POA: Insufficient documentation

## 2023-05-24 DIAGNOSIS — M6281 Muscle weakness (generalized): Secondary | ICD-10-CM | POA: Diagnosis present

## 2023-05-24 DIAGNOSIS — G8929 Other chronic pain: Secondary | ICD-10-CM | POA: Diagnosis present
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
## 2023-05-30 ENCOUNTER — Ambulatory Visit: Payer: 59 | Admitting: Neurology

## 2023-06-03 ENCOUNTER — Encounter (HOSPITAL_BASED_OUTPATIENT_CLINIC_OR_DEPARTMENT_OTHER): Payer: Self-pay | Admitting: Physical Therapy

## 2023-06-03 ENCOUNTER — Ambulatory Visit (HOSPITAL_BASED_OUTPATIENT_CLINIC_OR_DEPARTMENT_OTHER): Payer: 59 | Admitting: Physical Therapy

## 2023-06-03 DIAGNOSIS — G8929 Other chronic pain: Secondary | ICD-10-CM

## 2023-06-03 DIAGNOSIS — M25562 Pain in left knee: Secondary | ICD-10-CM | POA: Diagnosis not present

## 2023-06-03 DIAGNOSIS — M6281 Muscle weakness (generalized): Secondary | ICD-10-CM
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
## 2023-06-05 ENCOUNTER — Encounter: Payer: Self-pay | Admitting: Neurology

## 2023-06-05 ENCOUNTER — Ambulatory Visit (INDEPENDENT_AMBULATORY_CARE_PROVIDER_SITE_OTHER): Payer: 59 | Admitting: Neurology

## 2023-06-05 VITALS — BP 118/80 | Ht 66.0 in | Wt 140.0 lb

## 2023-06-05 DIAGNOSIS — G43709 Chronic migraine without aura, not intractable, without status migrainosus: Secondary | ICD-10-CM

## 2023-06-05 MED ORDER — ONABOTULINUMTOXINA 200 UNITS IJ SOLR
155.0000 [IU] | Freq: Once | INTRAMUSCULAR | Status: AC
Start: 2023-06-05 — End: ?

## 2023-06-05 MED ORDER — SUMATRIPTAN SUCCINATE 100 MG PO TABS
ORAL_TABLET | ORAL | 11 refills | Status: AC
Start: 2023-06-05 — End: ?

## 2023-06-05 NOTE — Progress Notes (Signed)
   She reported moderate improvement with Botox injection as migraine prevention.  No longer use Maxalt, use Imitrex as needed, along with Zofran.  Also complains of worsening depression, anxiety, failed multiple treatment in the past, is seeing psychotherapy,   Botox injection for chronic migraine prevention, injection was performed according to [place] protocol,  5 units of Botox was injected into each side, for 31 injection sites, total of 155 units  Bilateral frontalis 4 injection sites Bilateral corrugate 2 injection sites Procerus 1 injection sites. Bilateral temporalis 8 injection sites Bilateral occipitalis 6 injection sites Bilateral cervical paraspinals 4 injection sites Bilateral upper trapezius 6 injection sites  Extra 45 unites were injected into bilateral levator scapular and cervical paraspinal and masseter muscles  She tolerated injection well will return to clinic in 3 months for repeat injection

## 2023-06-05 NOTE — Progress Notes (Signed)
Botox- 100 units x 2 vial Lot: [id number] Expiration: 11.2023 NDC: 0023-3921-02  Bacteriostatic 0.9% Sodium Chloride- 4 mL  Lot: [id number] Expiration: 04.01.2025 NDC: 4696295284  Dx: G43.709 B/B Witnessed by Aundra Millet, CMA

## 2023-06-06 ENCOUNTER — Ambulatory Visit (HOSPITAL_BASED_OUTPATIENT_CLINIC_OR_DEPARTMENT_OTHER): Payer: 59 | Admitting: Physical Therapy

## 2023-06-06 ENCOUNTER — Encounter (HOSPITAL_BASED_OUTPATIENT_CLINIC_OR_DEPARTMENT_OTHER): Payer: Self-pay | Admitting: Physical Therapy

## 2023-06-06 DIAGNOSIS — G8929 Other chronic pain: Secondary | ICD-10-CM

## 2023-06-06 DIAGNOSIS — M25562 Pain in left knee: Secondary | ICD-10-CM | POA: Diagnosis not present

## 2023-06-06 DIAGNOSIS — M6281 Muscle weakness (generalized): Secondary | ICD-10-CM
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
## 2023-06-07 ENCOUNTER — Encounter (HOSPITAL_BASED_OUTPATIENT_CLINIC_OR_DEPARTMENT_OTHER): Payer: Self-pay | Admitting: Physical Therapy

## 2023-06-10 ENCOUNTER — Encounter (HOSPITAL_BASED_OUTPATIENT_CLINIC_OR_DEPARTMENT_OTHER): Payer: 59 | Admitting: Physical Therapy
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
## 2023-06-12 ENCOUNTER — Encounter (HOSPITAL_BASED_OUTPATIENT_CLINIC_OR_DEPARTMENT_OTHER): Payer: 59 | Admitting: Physical Therapy

## 2023-06-13 ENCOUNTER — Ambulatory Visit (INDEPENDENT_AMBULATORY_CARE_PROVIDER_SITE_OTHER): Payer: 59 | Admitting: Family Medicine

## 2023-06-13 ENCOUNTER — Encounter: Payer: Self-pay | Admitting: Family Medicine

## 2023-06-13 VITALS — BP 132/84 | HR 124 | Ht 66.0 in | Wt 145.0 lb

## 2023-06-13 DIAGNOSIS — F0781 Postconcussional syndrome: Secondary | ICD-10-CM

## 2023-06-13 DIAGNOSIS — M2392 Unspecified internal derangement of left knee: Secondary | ICD-10-CM | POA: Diagnosis not present

## 2023-06-13 DIAGNOSIS — M25562 Pain in left knee: Secondary | ICD-10-CM | POA: Diagnosis not present

## 2023-06-13 NOTE — Patient Instructions (Addendum)
Thank you for coming in today.   [facility] Address: [address] [place], [place] [address] Phone: [phone] Vassie Moment / Dennie Bible AT-C
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
## 2023-06-18 ENCOUNTER — Encounter (HOSPITAL_BASED_OUTPATIENT_CLINIC_OR_DEPARTMENT_OTHER): Payer: 59 | Admitting: Physical Therapy

## 2023-06-20 ENCOUNTER — Encounter (HOSPITAL_BASED_OUTPATIENT_CLINIC_OR_DEPARTMENT_OTHER): Payer: Self-pay | Admitting: Physical Therapy

## 2023-06-20 ENCOUNTER — Ambulatory Visit (HOSPITAL_BASED_OUTPATIENT_CLINIC_OR_DEPARTMENT_OTHER): Payer: 59 | Admitting: Physical Therapy

## 2023-06-25 ENCOUNTER — Encounter (HOSPITAL_BASED_OUTPATIENT_CLINIC_OR_DEPARTMENT_OTHER): Payer: Self-pay | Admitting: Physical Therapy

## 2023-06-26 ENCOUNTER — Ambulatory Visit (INDEPENDENT_AMBULATORY_CARE_PROVIDER_SITE_OTHER): Payer: 59 | Admitting: Orthopaedic Surgery

## 2023-06-26 ENCOUNTER — Encounter (HOSPITAL_BASED_OUTPATIENT_CLINIC_OR_DEPARTMENT_OTHER): Payer: Self-pay | Admitting: Physical Therapy

## 2023-06-26 ENCOUNTER — Ambulatory Visit (HOSPITAL_BASED_OUTPATIENT_CLINIC_OR_DEPARTMENT_OTHER): Payer: 59 | Attending: Family Medicine | Admitting: Physical Therapy

## 2023-06-26 ENCOUNTER — Other Ambulatory Visit (HOSPITAL_BASED_OUTPATIENT_CLINIC_OR_DEPARTMENT_OTHER): Payer: Self-pay

## 2023-06-26 DIAGNOSIS — S83242A Other tear of medial meniscus, current injury, left knee, initial encounter: Secondary | ICD-10-CM | POA: Diagnosis not present

## 2023-06-26 DIAGNOSIS — G8929 Other chronic pain: Secondary | ICD-10-CM | POA: Diagnosis present

## 2023-06-26 DIAGNOSIS — M25562 Pain in left knee: Secondary | ICD-10-CM | POA: Insufficient documentation

## 2023-06-26 DIAGNOSIS — M6281 Muscle weakness (generalized): Secondary | ICD-10-CM | POA: Diagnosis present

## 2023-06-26 MED ORDER — ASPIRIN 325 MG PO TBEC
325.0000 mg | DELAYED_RELEASE_TABLET | Freq: Every day | ORAL | 0 refills | Status: DC
  Filled 2023-06-26: qty 14, 14d supply, fill #0

## 2023-06-26 MED ORDER — IBUPROFEN 800 MG PO TABS
800.0000 mg | ORAL_TABLET | Freq: Three times a day (TID) | ORAL | 0 refills | Status: AC
  Filled 2023-06-26: qty 30, 10d supply, fill #0

## 2023-06-26 MED ORDER — ACETAMINOPHEN 500 MG PO TABS
500.0000 mg | ORAL_TABLET | Freq: Three times a day (TID) | ORAL | 0 refills | Status: AC
  Filled 2023-06-26: qty 30, 10d supply, fill #0

## 2023-06-26 MED ORDER — OXYCODONE HCL 5 MG PO TABS
5.0000 mg | ORAL_TABLET | ORAL | 0 refills | Status: DC | PRN
  Filled 2023-06-26: qty 5, 1d supply, fill #0
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
## 2023-06-26 NOTE — Progress Notes (Signed)
Chief Complaint: Left knee medial meniscal injury     History of Present Illness:    [name] [name] is a 47 y.o. female presents today with persistent left medial based knee pain in the setting of an injury that was subsequently exacerbated in rehab when she felt a pop in his left knee.  Since that time she has had pain and swelling.  She states that this will occasionally lock up and is quite excruciating when this happens.  She has been working in physical therapy diligently and is getting stronger which is helping out her contralateral side in terms of strength but persistently having left knee pain.  She is here for additional opinion as she was indicated for a left knee meniscal procedure.  She has additional questions regarding this.    Surgical History:   None  PMH/PSH/Family History/Social History/Meds/Allergies:    Past Medical History:  Diagnosis Date   Adenomatous colon polyp 2008   Anxiety 1999/2000   Arthritis    "jaw joints; neck" (07/28/2014)   Complication of anesthesia    "didn't wake up well/remembered stuff from during OR when I had pituitary surgery"   Constipation    Depression    hx   DVT (deep vein thrombosis) in pregnancy 09/2013   "LLE; postpartum"   Fibromyalgia    GERD (gastroesophageal reflux disease)    H/O hiatal hernia    Hematochezia    IBS (irritable bowel syndrome)    Internal hemorrhoids    Migraines    "couple times/wk" (07/28/2014)   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 Stan Head   ESOPHAGOGASTRODUODENOSCOPY  09/29/2004   Dr. Karolee Ohs   LAPAROSCOPIC APPENDECTOMY N/A 07/28/2014   Procedure: APPENDECTOMY  LAPAROSCOPIC;  Surgeon: Chevis Pretty III, MD;  Location: [facility] OR;  Service: General;  Laterality: N/A;   TRANSPHENOIDAL / TRANSNASAL HYPOPHYSECTOMY / RESECTION PITUITARY TUMOR  1999   resection of benign pituitary tumor, [facility]   UTERINE FIBROID SURGERY  2008   Social History   Socioeconomic History   Marital status: Unknown    Spouse name: Not on file   Number of children: 3   Years of education: College   Highest education level: Not on file  Occupational History   Occupation: Homemaker  Tobacco Use   Smoking status: Never   Smokeless tobacco: Never  Substance and Sexual Activity   Alcohol use: Yes    Alcohol/week: 1.0 standard drink of alcohol    Types: 1 Glasses of wine per week    Comment: Rarely - less than one drink every few months   Drug use: No   Sexual activity: Yes  Other Topics Concern   Not on file  Social History Narrative   Lives at home with her husband and three children.   Right-handed.   Rare caffeine use.   Social Determinants of Health   Financial Resource Strain: Not on file  Food Insecurity: Not on file  Transportation Needs: Not on file  Physical Activity: Not on file  Stress: Not on file  Social Connections: Not on file   Family History  Problem Relation Age of Onset   Heart disease Mother    Diabetes Mother    Heart disease Father    Prostate cancer Father    Alzheimer's disease Father    Colon cancer Maternal Grandmother    Heart disease Other    Prostate cancer Other    Diabetes Other    Autism Child        2 of 3 daughters have high functioning Autism   Breast cancer Paternal Aunt    Allergies  Allergen Reactions   Latex Rash   Acetaminophen Other (See Comments)    Itchy, hives, rash, stomach pain, nausea   Influenza Vaccines    Metoprolol Other (See Comments)    dizziness   Venlafaxine     Reports one time seizure on this medication (occurred 20+ years ago)   Verapamil Nausea Only    Headache, dizziness    Current Outpatient Medications  Medication Sig Dispense Refill   acetaminophen (TYLENOL) 500 MG tablet Take 1 tablet (500 mg total) by mouth every 8 (eight) hours for 10 days. 30 tablet 0   aspirin EC 325 MG tablet Take 1 tablet (325 mg total) by mouth daily. 14 tablet 0   ibuprofen (ADVIL) 800 MG tablet Take 1 tablet (800 mg total) by mouth every 8 (eight) hours for 10 days. Please take with food, please alternate with acetaminophen 30 tablet 0   oxyCODONE (ROXICODONE) 5 MG immediate release tablet Take 1 tablet (5 mg total) by mouth every 4 (four) hours as needed for severe pain or breakthrough pain. 5 tablet 0   amphetamine-dextroamphetamine (ADDERALL) 20 MG tablet Take 20 mg by mouth 2 (two) times daily as needed (adhd).     botulinum toxin Type A (BOTOX) 200 units injection Inject 155 units into the head and neck every 3 months. 1 each 0   CANNABIDIOL PO Take 2 tablets by mouth daily as needed (pain relief).     Cholecalciferol (VITAMIN D PO) Take 10,000 Units by mouth every other day.     Ferrous Sulfate (IRON PO) Take 1 tablet by mouth daily as needed (low iron).     ibuprofen (ADVIL) 800 MG tablet Take 1 tablet (800 mg total) by mouth 3 (three) times daily. 30 tablet 1   lidocaine (HM LIDOCAINE PATCH) 4 % Place 1 patch onto the skin daily. 5% lidocaine patch 30 patch 1   LORazepam (ATIVAN) 0.5 MG tablet 1-2 tabs 30 - 60 min prior to MRI. Do not drive with this medicine. 4 tablet 0   Multiple Vitamin (MULTIVITAMIN) capsule Take 1 capsule by mouth daily.     scopolamine (TRANSDERM-SCOP) 1 MG/3DAYS Place 1 patch (1.5 mg total) onto the skin every 3 (three) days. 10 patch 12   SUMAtriptan (IMITREX) 100 MG tablet May repeat in 2 hours if headache persists or recurs. 12 tablet 11   Current Facility-Administered Medications  Medication Dose Route Frequency Provider Last Rate Last Admin   botulinum toxin Type A (BOTOX) injection 155 Units  155 Units Intramuscular Once Levert Feinstein, MD        botulinum toxin Type A (BOTOX) injection 155 Units  155 Units Intramuscular Once Levert Feinstein, MD       botulinum toxin Type A (BOTOX) injection 155 Units  155 Units Intramuscular Once Levert Feinstein, MD       botulinum toxin Type A (  BOTOX) injection 200 Units  200 Units Intramuscular Once Levert Feinstein, MD       botulinum toxin Type A (BOTOX) injection 200 Units  200 Units Intramuscular Once Levert Feinstein, MD       No results found.  Review of Systems:   A ROS was performed including pertinent positives and negatives as documented in the HPI.  Physical Exam :   Constitutional: NAD and appears stated age Neurological: Alert and oriented Psych: Appropriate affect and cooperative unknown if currently breastfeeding.   Comprehensive Musculoskeletal Exam:      Musculoskeletal Exam  Gait Normal  Alignment Normal   Right Left  Inspection Normal Normal  Palpation    Tenderness None Medial tibiofemoral  Crepitus None None  Effusion None Trace  Range of Motion    Extension -3 -3  Flexion 135 135  Strength    Extension 5/5 5/5  Flexion 5/5 5/5  Ligament Exam     Generalized Laxity No No  Lachman Negative Negative   Pivot Shift Negative Negative  Anterior Drawer Negative Negative  Valgus at 0 Negative Negative  Valgus at 20 Negative Negative  Varus at 0 0 0  Varus at 20   0 0  Posterior Drawer at 90 0 0  Vascular/Lymphatic Exam    Edema None None  Venous Stasis Changes No No  Distal Circulation Normal Normal  Neurologic    Light Touch Sensation Intact Intact  Special Tests: Positive McMurray early left knee     Imaging:     MRI (left knee): There is a complex tear which is horizontal in nature involving the medial meniscus  I personally reviewed and interpreted the radiographs.   Assessment:   47 y.o. female with a left knee medial meniscal complex tear.  Given the amount of her meniscus involved I did ultimately discuss that I do believe she would benefit from a medial  meniscal repair with horizontal compression stitch.  Overall she has been attempting physical therapy with little to no relief.  This is occasionally locking causing her quite significant pain.  Given that we did discuss the process of medial meniscal repair.  I do believe that she would be a candidate for early weightbearing.  I did discuss the complications associated with this and after all of this she has elected for left knee arthroscopy with medial meniscal repair  Plan :    -Plan for left knee arthroscopy with medial meniscal repair   After a lengthy discussion of treatment options, including risks, benefits, alternatives, complications of surgical and nonsurgical conservative options, the patient elected surgical repair.   The patient  is aware of the material risks  and complications including, but not limited to injury to adjacent structures, neurovascular injury, infection, numbness, bleeding, implant failure, thermal burns, stiffness, persistent pain, failure to heal, disease transmission from allograft, need for further surgery, dislocation, anesthetic risks, blood clots, risks of death,and others. The probabilities of surgical success and failure discussed with patient given their particular co-morbidities.The time and nature of expected rehabilitation and recovery was discussed.The patient's questions were all answered preoperatively.  No barriers to understanding were noted. I explained the natural history of the disease process and Rx rationale.  I explained to the patient what I considered to be reasonable expectations given their personal situation.  The final treatment plan was arrived at through a shared patient decision making process model.      I personally saw and evaluated the patient, and participated in the management  and treatment plan.  Huel Cote, MD Attending Physician, Orthopedic Surgery  This document was dictated using Dragon voice recognition software. A  reasonable attempt at proof reading has been made to minimize errors.

## 2023-07-03 ENCOUNTER — Ambulatory Visit (HOSPITAL_BASED_OUTPATIENT_CLINIC_OR_DEPARTMENT_OTHER): Payer: 59 | Admitting: Orthopaedic Surgery

## 2023-07-29 ENCOUNTER — Emergency Department (HOSPITAL_COMMUNITY)
Admission: EM | Admit: 2023-07-29 | Discharge: 2023-07-29 | Disposition: A | Discharge status: Home / Self Care | Payer: 59 | Attending: Emergency Medicine | Admitting: Emergency Medicine

## 2023-07-29 ENCOUNTER — Emergency Department (HOSPITAL_COMMUNITY): Payer: 59

## 2023-07-29 ENCOUNTER — Encounter (HOSPITAL_COMMUNITY): Payer: Self-pay

## 2023-07-29 ENCOUNTER — Encounter (HOSPITAL_BASED_OUTPATIENT_CLINIC_OR_DEPARTMENT_OTHER): Payer: Self-pay | Admitting: Physical Therapy

## 2023-07-29 ENCOUNTER — Other Ambulatory Visit: Payer: Self-pay

## 2023-07-29 DIAGNOSIS — M545 Low back pain, unspecified: Secondary | ICD-10-CM | POA: Diagnosis not present

## 2023-07-29 DIAGNOSIS — S0990XA Unspecified injury of head, initial encounter: Secondary | ICD-10-CM | POA: Diagnosis present

## 2023-07-29 DIAGNOSIS — Z91040 Latex allergy status: Secondary | ICD-10-CM | POA: Diagnosis not present

## 2023-07-29 LAB — CBC
HCT: 39.8 % (ref 36.0–46.0)
Hemoglobin: 12.7 g/dL (ref 12.0–15.0)
MCH: 29 pg (ref 26.0–34.0)
MCHC: 31.9 g/dL (ref 30.0–36.0)
MCV: 90.9 fL (ref 80.0–100.0)
Platelets: 297 10*3/uL (ref 150–400)
RBC: 4.38 MIL/uL (ref 3.87–5.11)
RDW: 12.5 % (ref 11.5–15.5)
WBC: 7 10*3/uL (ref 4.0–10.5)
nRBC: 0 % (ref 0.0–0.2)

## 2023-07-29 LAB — COMPREHENSIVE METABOLIC PANEL
ALT: 13 U/L (ref 0–44)
AST: 19 U/L (ref 15–41)
Albumin: 4.4 g/dL (ref 3.5–5.0)
Alkaline Phosphatase: 25 U/L — ABNORMAL LOW (ref 38–126)
Anion gap: 9 (ref 5–15)
BUN: 5 mg/dL — ABNORMAL LOW (ref 6–20)
CO2: 26 mmol/L (ref 22–32)
Calcium: 9.3 mg/dL (ref 8.9–10.3)
Chloride: 103 mmol/L (ref 98–111)
Creatinine, Ser: 0.81 mg/dL (ref 0.44–1.00)
GFR, Estimated: >60 mL/min (ref >60)
Glucose, Bld: 95 mg/dL (ref 70–99)
Potassium: 3.8 mmol/L (ref 3.5–5.1)
Sodium: 138 mmol/L (ref 135–145)
Total Bilirubin: 0.9 mg/dL (ref 0.3–1.2)
Total Protein: 7 g/dL (ref 6.5–8.1)

## 2023-07-29 LAB — HCG, SERUM, QUALITATIVE: Preg, Serum: NEGATIVE

## 2023-07-29 MED ORDER — IBUPROFEN 400 MG PO TABS
600.0000 mg | ORAL_TABLET | Freq: Once | ORAL | Status: DC
  Filled 2023-07-29: qty 1

## 2023-07-29 MED ORDER — BACITRACIN ZINC 500 UNIT/GM EX OINT: TOPICAL_OINTMENT | Freq: Once | CUTANEOUS | Status: DC
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
## 2023-07-30 NOTE — Pre-Procedure Instructions (Signed)
Surgical Instructions   Your procedure is scheduled on Monday, October 21st. Report to [facility] Main Entrance "A" at 10:15 A.M., then check in with the Admitting office. Any questions or running late day of surgery: call [phone]  Questions prior to your surgery date: call [phone], Monday-Friday, 8am-4pm. If you experience any cold or flu symptoms such as cough, fever, chills, shortness of breath, etc. between now and your scheduled surgery, please notify us at the above number.     Remember:  Do not eat after midnight the night before your surgery   You may drink clear liquids until 09:15 AM the morning of your surgery.   Clear liquids allowed are: Water, Non-Citrus Juices (without pulp), Carbonated Beverages, Clear Tea, Black Coffee Only (NO MILK, CREAM OR POWDERED CREAMER of any kind), and Gatorade.    Take these medicines the morning of surgery with A SIP OF WATER  May take these medicines IF NEEDED: oxyCODONE (ROXICODONE)  SUMAtriptan (IMITREX)   Follow your surgeon's instructions on when to stop Asprin.  If no instructions were given by your surgeon then you will need to call the office to get those instructions.     One week prior to surgery, STOP taking any Aleve, Naproxen, Ibuprofen, Motrin, Advil, Goody's, BC's, all herbal medications, fish oil, and non-prescription vitamins.                     Do NOT Smoke (Tobacco/Vaping) for 24 hours prior to your procedure.  If you use a CPAP at night, you may bring your mask/headgear for your overnight stay.   You will be asked to remove any contacts, glasses, piercing's, hearing aid's, dentures/partials prior to surgery. Please bring cases for these items if needed.    Patients discharged the day of surgery will not be allowed to drive home, and someone needs to stay with them for 24 hours.  SURGICAL WAITING ROOM VISITATION Patients may have no more than 2 support people in the waiting area - these visitors may  rotate.   Pre-op nurse will coordinate an appropriate time for 1 ADULT support person, who may not rotate, to accompany patient in pre-op.  Children under the age of 63 must have an adult with them who is not the patient and must remain in the main waiting area with an adult.  If the patient needs to stay at the hospital during part of their recovery, the visitor guidelines for inpatient rooms apply.  Please refer to the [facility] website for the visitor guidelines for any additional information.   If you received a COVID test during your pre-op visit  it is requested that you wear a mask when out in public, stay away from anyone that may not be feeling well and notify your surgeon if you develop symptoms. If you have been in contact with anyone that has tested positive in the last 10 days please notify you surgeon.      Pre-operative CHG Bathing Instructions   You can play a key role in reducing the risk of infection after surgery. Your skin needs to be as free of germs as possible. You can reduce the number of germs on your skin by washing with CHG (chlorhexidine gluconate) soap before surgery. CHG is an antiseptic soap that kills germs and continues to kill germs even after washing.   DO NOT use if you have an allergy to chlorhexidine/CHG or antibacterial soaps. If your skin becomes reddened or irritated, stop using the  CHG and notify one of our RNs at [phone].              TAKE A SHOWER THE NIGHT BEFORE SURGERY AND THE DAY OF SURGERY    Please keep in mind the following:  DO NOT shave, including legs and underarms, 48 hours prior to surgery.   You may shave your face before/day of surgery.  Place clean sheets on your bed the night before surgery Use a clean washcloth (not used since being washed) for each shower. DO NOT sleep with pet's night before surgery.  CHG Shower Instructions:  Wash your face and private area with normal soap. If you choose to wash your hair, wash  first with your normal shampoo.  After you use shampoo/soap, rinse your hair and body thoroughly to remove shampoo/soap residue.  Turn the water OFF and apply half the bottle of CHG soap to a CLEAN washcloth.  Apply CHG soap ONLY FROM YOUR NECK DOWN TO YOUR TOES (washing for 3-5 minutes)  DO NOT use CHG soap on face, private areas, open wounds, or sores.  Pay special attention to the area where your surgery is being performed.  If you are having back surgery, having someone wash your back for you may be helpful. Wait 2 minutes after CHG soap is applied, then you may rinse off the CHG soap.  Pat dry with a clean towel  Put on clean pajamas    Additional instructions for the day of surgery: DO NOT APPLY any lotions, deodorants, cologne, or perfumes.   Do not wear jewelry or makeup Do not wear nail polish, gel polish, artificial nails, or any other type of covering on natural nails (fingers and toes) Do not bring valuables to the hospital. [facility] is not responsible for valuables/personal belongings. Put on clean/comfortable clothes.  Please brush your teeth.  Ask your nurse before applying any prescription medications to the skin.

## 2023-07-31 ENCOUNTER — Encounter (HOSPITAL_COMMUNITY): Payer: Self-pay

## 2023-07-31 ENCOUNTER — Inpatient Hospital Stay (HOSPITAL_COMMUNITY)
Admission: RE | Admit: 2023-07-31 | Discharge: 2023-07-31 | Disposition: A | Discharge status: Home / Self Care | Payer: 59 | Source: Ambulatory Visit

## 2023-07-31 ENCOUNTER — Ambulatory Visit (HOSPITAL_BASED_OUTPATIENT_CLINIC_OR_DEPARTMENT_OTHER): Payer: Self-pay | Admitting: Orthopaedic Surgery

## 2023-07-31 DIAGNOSIS — S83242A Other tear of medial meniscus, current injury, left knee, initial encounter: Secondary | ICD-10-CM

## 2023-08-08 ENCOUNTER — Inpatient Hospital Stay (HOSPITAL_COMMUNITY)
Admission: RE | Admit: 2023-08-08 | Discharge: 2023-08-08 | Disposition: A | Discharge status: Home / Self Care | Payer: 59 | Source: Ambulatory Visit

## 2023-08-11 NOTE — Plan of Care (Signed)
[facility] Tonsillectomy/Adenoidectomy, Postoperative PEDS care plan entered in error.

## 2023-08-12 ENCOUNTER — Ambulatory Visit (HOSPITAL_COMMUNITY): Admission: RE | Admit: 2023-08-12 | Payer: 59 | Source: Home / Self Care | Admitting: Orthopaedic Surgery

## 2023-08-12 SURGERY — ARTHROSCOPY, KNEE, WITH MENISCUS REPAIR
Anesthesia: General | Site: Knee | Laterality: Left

## 2023-08-14 ENCOUNTER — Ambulatory Visit (HOSPITAL_BASED_OUTPATIENT_CLINIC_OR_DEPARTMENT_OTHER): Payer: 59 | Admitting: Orthopaedic Surgery

## 2023-08-14 DIAGNOSIS — S83242A Other tear of medial meniscus, current injury, left knee, initial encounter: Secondary | ICD-10-CM

## 2023-08-14 NOTE — Progress Notes (Signed)
Chief Complaint: Left knee medial meniscal injury     History of Present Illness:   08/14/2023: Presents today for follow-up with additional questions of the left knee.  [name] [name] is a 47 y.o. female presents today with persistent left medial based knee pain in the setting of an injury that was subsequently exacerbated in rehab when she felt a pop in his left knee.  Since that time she has had pain and swelling.  She states that this will occasionally lock up and is quite excruciating when this happens.  She has been working in physical therapy diligently and is getting stronger which is helping out her contralateral side in terms of strength but persistently having left knee pain.  She is here for additional opinion as she was indicated for a left knee meniscal procedure.  She has additional questions regarding this.    Surgical History:   None  PMH/PSH/Family History/Social History/Meds/Allergies:    Past Medical History:  Diagnosis Date   Adenomatous colon polyp 10/22/2006   Anxiety 1999/2000   Arthritis    "jaw joints; neck" (07/28/2014)   Complication of anesthesia    "didn't wake up well/remembered stuff from during OR when I had pituitary surgery"   Constipation    Depression    hx   DVT (deep vein thrombosis) in pregnancy 09/21/2013   "LLE; postpartum"   Fibromyalgia    GERD (gastroesophageal reflux disease)    H/O hiatal hernia    Hematochezia    IBS (irritable bowel syndrome)    Internal hemorrhoids    Migraines    "couple times/wk" (07/28/2014)   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 Stan Head   ESOPHAGOGASTRODUODENOSCOPY  09/29/2004    Dr. Karolee Ohs   LAPAROSCOPIC APPENDECTOMY N/A 07/28/2014   Procedure: APPENDECTOMY LAPAROSCOPIC;  Surgeon: Chevis Pretty III, MD;  Location: [facility] OR;  Service: General;  Laterality: N/A;   TRANSPHENOIDAL / TRANSNASAL HYPOPHYSECTOMY / RESECTION PITUITARY TUMOR  1999   resection of benign pituitary tumor, [facility]   UTERINE FIBROID SURGERY  2008   Social History   Socioeconomic History   Marital status: Unknown    Spouse name: Not on file   Number of children: 3   Years of education: College   Highest education level: Not on file  Occupational History   Occupation: Homemaker  Tobacco Use   Smoking status: Never   Smokeless tobacco: Never  Substance and Sexual Activity   Alcohol use: Yes    Alcohol/week: 1.0 standard drink of alcohol    Types: 1 Glasses of wine per week    Comment: Rarely - less than one drink every few months   Drug use: No   Sexual activity: Yes  Other Topics Concern   Not on file  Social History Narrative   Lives at home with her husband and three children.   Right-handed.   Rare caffeine use.   Social Determinants of Health   Financial Resource Strain: Not on file  Food Insecurity: Not on file  Transportation Needs: Not on file  Physical Activity: Not on file  Stress: Not on file  Social Connections: Not on file   Family History  Problem Relation Age of Onset   Heart disease Mother    Diabetes Mother    Heart disease Father    Prostate cancer Father    Alzheimer's disease Father    Colon cancer Maternal Grandmother    Heart disease Other    Prostate cancer Other    Diabetes Other    Autism Child        2 of 3 daughters have high functioning Autism   Breast cancer Paternal Aunt    Allergies  Allergen Reactions   Latex Rash   Acetaminophen Other (See Comments)    Itchy, hives, rash, stomach pain, nausea   Influenza Vaccines    Metoprolol Other (See Comments)    dizziness   Venlafaxine     Reports one time seizure on this  medication (occurred 20+ years ago)   Verapamil Nausea Only    Headache, dizziness   Current Outpatient Medications  Medication Sig Dispense Refill   amphetamine-dextroamphetamine (ADDERALL) 20 MG tablet Take 20 mg by mouth 2 (two) times daily as needed (adhd).     aspirin EC 325 MG tablet Take 1 tablet (325 mg total) by mouth daily. 14 tablet 0   botulinum toxin Type A (BOTOX) 200 units injection Inject 155 units into the head and neck every 3 months. 1 each 0   CANNABIDIOL PO Take 2 tablets by mouth daily as needed (pain relief).     Cholecalciferol (VITAMIN D PO) Take 10,000 Units by mouth every other day.     Ferrous Sulfate (IRON PO) Take 1 tablet by mouth daily as needed (low iron).     ibuprofen (ADVIL) 800 MG tablet Take 1 tablet (800 mg total) by mouth 3 (three) times daily. 30 tablet 1   lidocaine (HM LIDOCAINE PATCH) 4 % Place 1 patch onto the skin daily. 5% lidocaine patch 30 patch 1   LORazepam (ATIVAN) 0.5 MG tablet 1-2 tabs 30 - 60 min prior to MRI. Do not drive with this medicine. 4 tablet 0   Multiple Vitamin (MULTIVITAMIN) capsule Take 1 capsule by mouth daily.     oxyCODONE (ROXICODONE) 5 MG immediate release tablet Take 1 tablet (5 mg total) by mouth every 4 (four) hours as needed for severe pain or breakthrough pain. 5 tablet 0   scopolamine (TRANSDERM-SCOP) 1 MG/3DAYS Place 1 patch (1.5 mg total) onto the skin every 3 (three) days. 10 patch 12   SUMAtriptan (IMITREX) 100 MG tablet May repeat in 2 hours if headache persists or recurs. 12 tablet 11   Current Facility-Administered Medications  Medication Dose Route Frequency Provider Last Rate Last Admin   botulinum toxin Type A (BOTOX) injection 155 Units  155 Units Intramuscular Once Levert Feinstein, MD       botulinum toxin Type A (BOTOX) injection 155 Units  155 Units Intramuscular Once Levert Feinstein, MD       botulinum toxin Type A (BOTOX) injection 155 Units  155 Units Intramuscular Once Levert Feinstein, MD       botulinum toxin  Type A (BOTOX) injection 200 Units  200 Units Intramuscular Once Levert Feinstein, MD       botulinum toxin Type A (BOTOX) injection 200 Units  200 Units Intramuscular Once Levert Feinstein, MD       No results found.  Review of  Systems:   A ROS was performed including pertinent positives and negatives as documented in the HPI.  Physical Exam :   Constitutional: NAD and appears stated age Neurological: Alert and oriented Psych: Appropriate affect and cooperative unknown if currently breastfeeding.   Comprehensive Musculoskeletal Exam:      Musculoskeletal Exam  Gait Normal  Alignment Normal   Right Left  Inspection Normal Normal  Palpation    Tenderness None Medial tibiofemoral  Crepitus None None  Effusion None Trace  Range of Motion    Extension -3 -3  Flexion 135 135  Strength    Extension 5/5 5/5  Flexion 5/5 5/5  Ligament Exam     Generalized Laxity No No  Lachman Negative Negative   Pivot Shift Negative Negative  Anterior Drawer Negative Negative  Valgus at 0 Negative Negative  Valgus at 20 Negative Negative  Varus at 0 0 0  Varus at 20   0 0  Posterior Drawer at 90 0 0  Vascular/Lymphatic Exam    Edema None None  Venous Stasis Changes No No  Distal Circulation Normal Normal  Neurologic    Light Touch Sensation Intact Intact  Special Tests: Positive McMurray early left knee     Imaging:     MRI (left knee): There is a complex tear which is horizontal in nature involving the medial meniscus  I personally reviewed and interpreted the radiographs.   Assessment:   47 y.o. female with a left knee medial meniscal complex tear.  Given the amount of her meniscus involved I did ultimately discuss that I do believe she would benefit from a medial meniscal repair with horizontal compression stitch.  Overall she has been attempting physical therapy with little to no relief.  This is occasionally locking causing her quite significant pain.  Given that we did discuss the  process of medial meniscal repair.  I do believe that she would be a candidate for early weightbearing.  I did discuss the complications associated with this and after all of this she has elected for left knee arthroscopy with medial meniscal repair  Plan :    -Plan for left knee arthroscopy with medial meniscal repair   After a lengthy discussion of treatment options, including risks, benefits, alternatives, complications of surgical and nonsurgical conservative options, the patient elected surgical repair.   The patient  is aware of the material risks  and complications including, but not limited to injury to adjacent structures, neurovascular injury, infection, numbness, bleeding, implant failure, thermal burns, stiffness, persistent pain, failure to heal, disease transmission from allograft, need for further surgery, dislocation, anesthetic risks, blood clots, risks of death,and others. The probabilities of surgical success and failure discussed with patient given their particular co-morbidities.The time and nature of expected rehabilitation and recovery was discussed.The patient's questions were all answered preoperatively.  No barriers to understanding were noted. I explained the natural history of the disease process and Rx rationale.  I explained to the patient what I considered to be reasonable expectations given their personal situation.  The final treatment plan was arrived at through a shared patient decision making process model.      I personally saw and evaluated the patient, and participated in the management and treatment plan.  Huel Cote, MD Attending Physician, Orthopedic Surgery  This document was dictated using Dragon voice recognition software. A reasonable attempt at proof reading has been made to minimize errors.

## 2023-08-17 ENCOUNTER — Ambulatory Visit (HOSPITAL_BASED_OUTPATIENT_CLINIC_OR_DEPARTMENT_OTHER): Payer: 59 | Attending: Family Medicine | Admitting: Physical Therapy

## 2023-08-21 ENCOUNTER — Encounter (HOSPITAL_BASED_OUTPATIENT_CLINIC_OR_DEPARTMENT_OTHER): Payer: 59 | Admitting: Physical Therapy

## 2023-08-26 ENCOUNTER — Encounter (HOSPITAL_BASED_OUTPATIENT_CLINIC_OR_DEPARTMENT_OTHER): Payer: 59 | Admitting: Physical Therapy

## 2023-08-26 ENCOUNTER — Encounter (HOSPITAL_BASED_OUTPATIENT_CLINIC_OR_DEPARTMENT_OTHER): Payer: 59 | Admitting: Orthopaedic Surgery

## 2023-09-02 ENCOUNTER — Encounter (HOSPITAL_BASED_OUTPATIENT_CLINIC_OR_DEPARTMENT_OTHER): Payer: 59 | Admitting: Physical Therapy

## 2023-09-03 ENCOUNTER — Ambulatory Visit: Payer: 59 | Admitting: Neurology

## 2023-09-04 ENCOUNTER — Ambulatory Visit: Payer: 59 | Admitting: Neurology

## 2023-09-09 ENCOUNTER — Encounter (HOSPITAL_BASED_OUTPATIENT_CLINIC_OR_DEPARTMENT_OTHER): Payer: 59 | Admitting: Physical Therapy

## 2023-09-11 ENCOUNTER — Ambulatory Visit (INDEPENDENT_AMBULATORY_CARE_PROVIDER_SITE_OTHER): Payer: 59 | Admitting: Neurology

## 2023-09-11 VITALS — BP 110/61 | HR 69

## 2023-09-11 DIAGNOSIS — G43709 Chronic migraine without aura, not intractable, without status migrainosus: Secondary | ICD-10-CM

## 2023-09-11 MED ORDER — ONABOTULINUMTOXINA 200 UNITS IJ SOLR
200.0000 [IU] | Freq: Once | INTRAMUSCULAR | Status: AC
Start: 2023-09-11 — End: 2023-09-11
  Administered 2023-09-11: 200 [IU] via INTRAMUSCULAR

## 2023-09-11 MED ORDER — BUTALBITAL-APAP-CAFFEINE 50-300-40 MG PO CAPS: 1.0000 | ORAL_CAPSULE | Freq: Every day | ORAL | 5 refills | Status: DC | PRN

## 2023-09-11 MED ORDER — ONABOTULINUMTOXINA 200 UNITS IJ SOLR
155.0000 [IU] | Freq: Once | INTRAMUSCULAR | Status: DC
Start: 2023-09-11 — End: 2023-09-11

## 2023-09-11 NOTE — Progress Notes (Signed)
Botox- 200 units x 1 vial Lot: [id number] Expiration: 09.2026 NDC: 0023-3921-02  Bacteriostatic 0.9% Sodium Chloride- 4mL  Lot: [id number] Expiration: 04.01.2025 NDC: [id number]  Dx: G56.709 B/B Witnessed by April, RN

## 2023-09-11 NOTE — Progress Notes (Signed)
   She reported moderate improvement with Botox injection as migraine prevention.  No longer use Maxalt, use Imitrex as needed, along with Zofran.  Also complains of worsening depression, anxiety, failed multiple treatment in the past, is seeing psychotherapy,   Botox injection for chronic migraine prevention, injection was performed according to [place] protocol,  5 units of Botox was injected into each side, for 31 injection sites, total of 155 units  Bilateral frontalis 4 injection sites Bilateral corrugate 2 injection sites Procerus 1 injection sites. Bilateral temporalis 8 injection sites Bilateral occipitalis 6 injection sites Bilateral cervical paraspinals 4 injection sites Bilateral upper trapezius 6 injection sites  Extra 45 unites were injected into bilateral levator scapular and cervical paraspinal and masseter muscles  She tolerated injection well will return to clinic in 3 months for repeat injection  Meds ordered this encounter   Butalbital-APAP-Caffeine (FIORICET) 50-300-40 MG CAPS    Sig: Take 1 capsule by mouth daily as needed. Do not refill in less than 30 days    Dispense:  12 capsule    Refill:  5

## 2023-09-12 ENCOUNTER — Other Ambulatory Visit: Payer: Self-pay | Admitting: Neurology

## 2023-09-13 ENCOUNTER — Other Ambulatory Visit: Payer: Self-pay

## 2023-09-13 ENCOUNTER — Encounter (HOSPITAL_COMMUNITY): Payer: Self-pay | Admitting: Orthopaedic Surgery

## 2023-09-13 ENCOUNTER — Other Ambulatory Visit (HOSPITAL_COMMUNITY): Payer: Self-pay

## 2023-09-13 ENCOUNTER — Ambulatory Visit: Payer: Self-pay | Admitting: Orthopaedic Surgery

## 2023-09-13 DIAGNOSIS — S83242A Other tear of medial meniscus, current injury, left knee, initial encounter: Secondary | ICD-10-CM

## 2023-09-13 NOTE — Progress Notes (Signed)
SDW CALL  Patient was given pre-op instructions over the phone. The opportunity was given for the patient to ask questions. No further questions asked. Patient verbalized understanding of instructions given.   PCP - denies Cardiologist - denies Neuologist - Dr. Terrace Arabia  PPM/ICD - denies   Chest x-ray - denies EKG - 2023 Stress Test - denies ECHO - 11/2021 Cardiac Cath - denies  Sleep Study - reports OSA CPAP - denies  Fasting Blood Sugar - n/a   Blood Thinner Instructions: n/a Aspirin Instructions: patient states she does not take Aspirin  ERAS Protcol - clears until 1300   COVID TEST- n/a   Anesthesia review: no  Patient denies shortness of breath, fever, cough and chest pain over the phone call   All instructions explained to the patient, with a verbal understanding of the material. Patient agrees to go over the instructions while at home for a better understanding.

## 2023-09-16 ENCOUNTER — Encounter (HOSPITAL_BASED_OUTPATIENT_CLINIC_OR_DEPARTMENT_OTHER): Payer: 59 | Admitting: Physical Therapy

## 2023-09-17 ENCOUNTER — Ambulatory Visit (HOSPITAL_BASED_OUTPATIENT_CLINIC_OR_DEPARTMENT_OTHER): Payer: 59 | Admitting: Anesthesiology

## 2023-09-17 ENCOUNTER — Other Ambulatory Visit: Payer: Self-pay

## 2023-09-17 ENCOUNTER — Encounter (HOSPITAL_COMMUNITY)
Admission: RE | Disposition: A | Discharge status: Home / Self Care | Payer: Self-pay | Source: Home / Self Care | Attending: Orthopaedic Surgery

## 2023-09-17 ENCOUNTER — Telehealth: Payer: Self-pay

## 2023-09-17 ENCOUNTER — Other Ambulatory Visit (HOSPITAL_COMMUNITY): Payer: Self-pay

## 2023-09-17 ENCOUNTER — Ambulatory Visit (HOSPITAL_COMMUNITY): Payer: 59 | Admitting: Anesthesiology

## 2023-09-17 ENCOUNTER — Ambulatory Visit (HOSPITAL_COMMUNITY)
Admission: RE | Admit: 2023-09-17 | Discharge: 2023-09-17 | Disposition: A | Discharge status: Home / Self Care | Payer: 59 | Attending: Orthopaedic Surgery | Admitting: Orthopaedic Surgery

## 2023-09-17 DIAGNOSIS — Z86718 Personal history of other venous thrombosis and embolism: Secondary | ICD-10-CM | POA: Diagnosis not present

## 2023-09-17 DIAGNOSIS — G44209 Tension-type headache, unspecified, not intractable: Secondary | ICD-10-CM

## 2023-09-17 DIAGNOSIS — S83232A Complex tear of medial meniscus, current injury, left knee, initial encounter: Secondary | ICD-10-CM | POA: Diagnosis present

## 2023-09-17 DIAGNOSIS — X58XXXA Exposure to other specified factors, initial encounter: Secondary | ICD-10-CM | POA: Insufficient documentation

## 2023-09-17 DIAGNOSIS — S83242A Other tear of medial meniscus, current injury, left knee, initial encounter: Secondary | ICD-10-CM

## 2023-09-17 DIAGNOSIS — G4730 Sleep apnea, unspecified: Secondary | ICD-10-CM | POA: Insufficient documentation

## 2023-09-17 DIAGNOSIS — G43709 Chronic migraine without aura, not intractable, without status migrainosus: Secondary | ICD-10-CM

## 2023-09-17 HISTORY — PX: KNEE ARTHROSCOPY WITH MENISCAL REPAIR: SHX5653

## 2023-09-17 HISTORY — DX: Anemia, unspecified: D64.9

## 2023-09-17 HISTORY — DX: Hypoglycemia, unspecified: E16.2

## 2023-09-17 HISTORY — DX: Nausea with vomiting, unspecified: R11.2

## 2023-09-17 HISTORY — DX: Other specified postprocedural states: Z98.890

## 2023-09-17 LAB — CBC
HCT: 36.3 % (ref 36.0–46.0)
Hemoglobin: 12 g/dL (ref 12.0–15.0)
MCH: 29.4 pg (ref 26.0–34.0)
MCHC: 33.1 g/dL (ref 30.0–36.0)
MCV: 89 fL (ref 80.0–100.0)
Platelets: 284 10*3/uL (ref 150–400)
RBC: 4.08 MIL/uL (ref 3.87–5.11)
RDW: 12.6 % (ref 11.5–15.5)
WBC: 6.3 10*3/uL (ref 4.0–10.5)
nRBC: 0 % (ref 0.0–0.2)

## 2023-09-17 LAB — POCT PREGNANCY, URINE: Preg Test, Ur: NEGATIVE

## 2023-09-17 SURGERY — ARTHROSCOPY, KNEE, WITH MENISCUS REPAIR
Anesthesia: General | Site: Knee | Laterality: Left

## 2023-09-17 MED ORDER — CHLORHEXIDINE GLUCONATE 0.12 % MT SOLN
OROMUCOSAL | Status: AC
  Administered 2023-09-17: 15 mL via OROMUCOSAL
  Filled 2023-09-17: qty 15

## 2023-09-17 MED ORDER — FENTANYL CITRATE (PF) 250 MCG/5ML IJ SOLN
INTRAMUSCULAR | Status: AC
  Filled 2023-09-17: qty 5

## 2023-09-17 MED ORDER — ONDANSETRON HCL 4 MG/2ML IJ SOLN
4.0000 mg | Freq: Once | INTRAMUSCULAR | Status: DC | PRN
Start: 2023-09-17 — End: 2023-09-18

## 2023-09-17 MED ORDER — CHLORHEXIDINE GLUCONATE 0.12 % MT SOLN: 15.0000 mL | Freq: Once | OROMUCOSAL | Status: AC

## 2023-09-17 MED ORDER — KETOROLAC TROMETHAMINE 30 MG/ML IJ SOLN
INTRAMUSCULAR | Status: DC | PRN
  Administered 2023-09-17: 30 mg via INTRAVENOUS

## 2023-09-17 MED ORDER — OXYCODONE HCL 5 MG/5ML PO SOLN
ORAL | Status: AC
  Filled 2023-09-17: qty 5

## 2023-09-17 MED ORDER — MIDAZOLAM HCL 2 MG/2ML IJ SOLN
INTRAMUSCULAR | Status: AC
  Filled 2023-09-17: qty 2

## 2023-09-17 MED ORDER — PROPOFOL 10 MG/ML IV BOLUS
INTRAVENOUS | Status: AC
  Filled 2023-09-17: qty 20

## 2023-09-17 MED ORDER — GABAPENTIN 300 MG PO CAPS
300.0000 mg | ORAL_CAPSULE | Freq: Once | ORAL | Status: AC
  Administered 2023-09-17: 300 mg via ORAL
  Filled 2023-09-17: qty 1

## 2023-09-17 MED ORDER — LIDOCAINE 2% (20 MG/ML) 5 ML SYRINGE
INTRAMUSCULAR | Status: AC
  Filled 2023-09-17: qty 5

## 2023-09-17 MED ORDER — KETOROLAC TROMETHAMINE 30 MG/ML IJ SOLN: 30.0000 mg | Freq: Once | INTRAMUSCULAR | Status: DC | PRN

## 2023-09-17 MED ORDER — MEPERIDINE HCL 25 MG/ML IJ SOLN: 6.2500 mg | INTRAMUSCULAR | Status: DC | PRN

## 2023-09-17 MED ORDER — ONDANSETRON HCL 4 MG/2ML IJ SOLN
INTRAMUSCULAR | Status: DC | PRN
  Administered 2023-09-17: 4 mg via INTRAVENOUS

## 2023-09-17 MED ORDER — OXYCODONE HCL 5 MG PO TABS: 5.0000 mg | ORAL_TABLET | Freq: Once | ORAL | Status: AC | PRN

## 2023-09-17 MED ORDER — 0.9 % SODIUM CHLORIDE (POUR BTL) OPTIME
TOPICAL | Status: DC | PRN
  Administered 2023-09-17: 1000 mL

## 2023-09-17 MED ORDER — HYDROMORPHONE HCL 1 MG/ML IJ SOLN
INTRAMUSCULAR | Status: AC
  Filled 2023-09-17: qty 1

## 2023-09-17 MED ORDER — LACTATED RINGERS IV SOLN: INTRAVENOUS | Status: DC | PRN

## 2023-09-17 MED ORDER — KETAMINE HCL 50 MG/5ML IJ SOSY
PREFILLED_SYRINGE | INTRAMUSCULAR | Status: AC
  Filled 2023-09-17: qty 5

## 2023-09-17 MED ORDER — OXYCODONE HCL 5 MG/5ML PO SOLN
5.0000 mg | Freq: Once | ORAL | Status: AC | PRN
  Administered 2023-09-17: 5 mg via ORAL

## 2023-09-17 MED ORDER — ACETAMINOPHEN 500 MG PO TABS: 500.0000 mg | ORAL_TABLET | Freq: Three times a day (TID) | ORAL | 0 refills | Status: AC

## 2023-09-17 MED ORDER — AMISULPRIDE (ANTIEMETIC) 5 MG/2ML IV SOLN
10.0000 mg | Freq: Once | INTRAVENOUS | Status: DC | PRN
Start: 2023-09-17 — End: 2023-09-18

## 2023-09-17 MED ORDER — EPINEPHRINE PF 1 MG/ML IJ SOLN
INTRAMUSCULAR | Status: AC
  Filled 2023-09-17: qty 1

## 2023-09-17 MED ORDER — SCOPOLAMINE 1 MG/3DAYS TD PT72
1.0000 | MEDICATED_PATCH | TRANSDERMAL | Status: DC
  Administered 2023-09-17: 1.5 mg via TRANSDERMAL
  Filled 2023-09-17: qty 1

## 2023-09-17 MED ORDER — FENTANYL CITRATE (PF) 250 MCG/5ML IJ SOLN
INTRAMUSCULAR | Status: DC | PRN
  Administered 2023-09-17 (×3): 50 ug via INTRAVENOUS

## 2023-09-17 MED ORDER — TRANEXAMIC ACID-NACL 1000-0.7 MG/100ML-% IV SOLN
1000.0000 mg | INTRAVENOUS | Status: DC
  Filled 2023-09-17: qty 100

## 2023-09-17 MED ORDER — HYDROMORPHONE HCL 1 MG/ML IJ SOLN
0.2500 mg | INTRAMUSCULAR | Status: DC | PRN
  Administered 2023-09-17 (×3): 0.5 mg via INTRAVENOUS

## 2023-09-17 MED ORDER — SODIUM CHLORIDE 0.9 % IR SOLN: Status: DC | PRN

## 2023-09-17 MED ORDER — ORAL CARE MOUTH RINSE: 15.0000 mL | Freq: Once | OROMUCOSAL | Status: AC

## 2023-09-17 MED ORDER — ASPIRIN 325 MG PO TBEC: 325.0000 mg | DELAYED_RELEASE_TABLET | Freq: Every day | ORAL | 0 refills | Status: AC

## 2023-09-17 MED ORDER — LIDOCAINE 2% (20 MG/ML) 5 ML SYRINGE
INTRAMUSCULAR | Status: DC | PRN
  Administered 2023-09-17: 60 mg via INTRAVENOUS

## 2023-09-17 MED ORDER — MIDAZOLAM HCL 2 MG/2ML IJ SOLN
INTRAMUSCULAR | Status: DC | PRN
  Administered 2023-09-17: 2 mg via INTRAVENOUS

## 2023-09-17 MED ORDER — SODIUM CHLORIDE 0.9 % IR SOLN
Status: DC | PRN
  Administered 2023-09-17: 6000 mL

## 2023-09-17 MED ORDER — ACETAMINOPHEN 500 MG PO TABS: 1000.0000 mg | ORAL_TABLET | Freq: Once | ORAL | Status: DC

## 2023-09-17 MED ORDER — BUPIVACAINE HCL (PF) 0.25 % IJ SOLN
INTRAMUSCULAR | Status: AC
  Filled 2023-09-17: qty 30

## 2023-09-17 MED ORDER — MIDAZOLAM HCL 2 MG/2ML IJ SOLN
INTRAMUSCULAR | Status: AC
Start: 2023-09-17 — End: ?
  Filled 2023-09-17: qty 2

## 2023-09-17 MED ORDER — BUPIVACAINE-EPINEPHRINE (PF) 0.5% -1:200000 IJ SOLN
INTRAMUSCULAR | Status: DC | PRN
  Administered 2023-09-17: 16 mL via PERINEURAL

## 2023-09-17 MED ORDER — MIDAZOLAM HCL 2 MG/2ML IJ SOLN: 1.0000 mg | Freq: Once | INTRAMUSCULAR | Status: DC

## 2023-09-17 MED ORDER — OXYCODONE HCL 5 MG PO TABS: 5.0000 mg | ORAL_TABLET | ORAL | 0 refills | Status: AC | PRN

## 2023-09-17 MED ORDER — PROPOFOL 10 MG/ML IV BOLUS
INTRAVENOUS | Status: DC | PRN
Start: 2023-09-17 — End: 2023-09-17
  Administered 2023-09-17: 200 mg via INTRAVENOUS

## 2023-09-17 MED ORDER — PHENYLEPHRINE 80 MCG/ML (10ML) SYRINGE FOR IV PUSH (FOR BLOOD PRESSURE SUPPORT)
PREFILLED_SYRINGE | INTRAVENOUS | Status: DC | PRN
Start: 2023-09-17 — End: 2023-09-17
  Administered 2023-09-17 (×4): 120 ug via INTRAVENOUS

## 2023-09-17 MED ORDER — IBUPROFEN 800 MG PO TABS: 800.0000 mg | ORAL_TABLET | Freq: Three times a day (TID) | ORAL | 0 refills | Status: AC

## 2023-09-17 MED ORDER — CEFAZOLIN SODIUM-DEXTROSE 2-4 GM/100ML-% IV SOLN
2.0000 g | INTRAVENOUS | Status: AC
  Administered 2023-09-17: 2 g via INTRAVENOUS
  Filled 2023-09-17: qty 100

## 2023-09-17 SURGICAL SUPPLY — 69 items
ALCOHOL 70% 16 OZ (MISCELLANEOUS) ×1 IMPLANT
BAG COUNTER SPONGE SURGICOUNT (BAG) ×1 IMPLANT
BANDAGE ESMARK 6X9 LF (GAUZE/BANDAGES/DRESSINGS) IMPLANT
BLADE CLIPPER SURG (BLADE) IMPLANT
BLADE EXCALIBUR 4.0X13 (MISCELLANEOUS) ×1 IMPLANT
BLADE SHAVER TORPEDO 4X13 (MISCELLANEOUS) ×1 IMPLANT
BLADE SURG 10 STRL SS (BLADE) ×1 IMPLANT
BLADE SURG 15 STRL LF DISP TIS (BLADE) ×1 IMPLANT
BNDG ELASTIC 6INX 5YD STR LF (GAUZE/BANDAGES/DRESSINGS) IMPLANT
BNDG ELASTIC 6X5.8 VLCR STR LF (GAUZE/BANDAGES/DRESSINGS) IMPLANT
BNDG ESMARK 6X9 LF (GAUZE/BANDAGES/DRESSINGS)
CHLORAPREP W/TINT 26 (MISCELLANEOUS) ×1 IMPLANT
COOLER ICEMAN CLASSIC (MISCELLANEOUS) ×1 IMPLANT
COVER SURGICAL LIGHT HANDLE (MISCELLANEOUS) ×1 IMPLANT
CUFF TOURN SGL QUICK 42 (TOURNIQUET CUFF) IMPLANT
CUFF TRNQT CYL 34X4.125X (TOURNIQUET CUFF) IMPLANT
DRAPE ARTHROSCOPY W/POUCH 114 (DRAPES) ×1 IMPLANT
DRAPE HALF SHEET 40X57 (DRAPES) IMPLANT
DRAPE INCISE IOBAN 66X45 STRL (DRAPES) IMPLANT
DRAPE U-SHAPE 47X51 STRL (DRAPES) ×1 IMPLANT
DRSG TEGADERM 4X4.75 (GAUZE/BANDAGES/DRESSINGS) ×3 IMPLANT
DW OUTFLOW CASSETTE/TUBE SET (MISCELLANEOUS) ×1 IMPLANT
ELECT REM PT RETURN 9FT ADLT (ELECTROSURGICAL) ×1
ELECTRODE REM PT RTRN 9FT ADLT (ELECTROSURGICAL) ×1 IMPLANT
FASTFIX FLEX CRVD INSERT CANN (Miscellaneous) ×1 IMPLANT
GAUZE PAD ABD 8X10 STRL (GAUZE/BANDAGES/DRESSINGS) IMPLANT
GAUZE SPONGE 4X4 12PLY STRL (GAUZE/BANDAGES/DRESSINGS) IMPLANT
GAUZE XEROFORM 1X8 LF (GAUZE/BANDAGES/DRESSINGS) IMPLANT
GLOVE BIOGEL PI IND STRL 6.5 (GLOVE) ×1 IMPLANT
GLOVE BIOGEL PI IND STRL 8 (GLOVE) ×1 IMPLANT
GLOVE ECLIPSE 6.0 STRL STRAW (GLOVE) ×1 IMPLANT
GLOVE INDICATOR 8.0 STRL GRN (GLOVE) ×1 IMPLANT
GLOVE SURG SS PI 6.5 STRL IVOR (GLOVE) IMPLANT
GLOVE SURG SS PI 8.0 STRL IVOR (GLOVE) IMPLANT
GOWN STRL REUS W/ TWL LRG LVL3 (GOWN DISPOSABLE) ×2 IMPLANT
GOWN STRL REUS W/ TWL XL LVL3 (GOWN DISPOSABLE) ×1 IMPLANT
INSERT FASTFIX FLEX CRVD CANN (Miscellaneous) IMPLANT
KIT BASIN OR (CUSTOM PROCEDURE TRAY) ×1 IMPLANT
KIT TURNOVER KIT B (KITS) ×1 IMPLANT
MANAGER SUT NOVOCUT (CUTTER) IMPLANT
MANIFOLD NEPTUNE II (INSTRUMENTS) IMPLANT
NDL 18GX1X1/2 (RX/OR ONLY) (NEEDLE) IMPLANT
NDL HYPO 25GX1X1/2 BEV (NEEDLE) ×1 IMPLANT
NDL SUT 2-0 SCORPION KNEE (NEEDLE) IMPLANT
NEEDLE 18GX1X1/2 (RX/OR ONLY) (NEEDLE) IMPLANT
NEEDLE HYPO 25GX1X1/2 BEV (NEEDLE) ×1 IMPLANT
NEEDLE SUT 2-0 SCORPION KNEE (NEEDLE) IMPLANT
NS IRRIG 1000ML POUR BTL (IV SOLUTION) ×1 IMPLANT
PACK ARTHROSCOPY DSU (CUSTOM PROCEDURE TRAY) ×1 IMPLANT
PAD ARMBOARD 7.5X6 YLW CONV (MISCELLANEOUS) ×2 IMPLANT
PADDING CAST ABS COTTON 6X4 NS (CAST SUPPLIES) IMPLANT
PADDING CAST COTTON 6X4 STRL (CAST SUPPLIES) ×1 IMPLANT
PENCIL BUTTON HOLSTER BLD 10FT (ELECTRODE) ×1 IMPLANT
PORT APPOLLO RF 90DEGREE MULTI (SURGICAL WAND) IMPLANT
SLEEVE SCD COMPRESS KNEE MED (STOCKING) IMPLANT
SOL PREP POV-IOD 4OZ 10% (MISCELLANEOUS) ×1 IMPLANT
SPONGE T-LAP 4X18 ~~LOC~~+RFID (SPONGE) ×1 IMPLANT
SUT ETHILON 3 0 PS 1 (SUTURE) IMPLANT
SUT FIBERWIRE 2-0 18 17.9 3/8 (SUTURE)
SUT MENISCAL KIT (KITS) IMPLANT
SUTURE FIBERWR 2-0 18 17.9 3/8 (SUTURE) IMPLANT
SYR 20ML ECCENTRIC (SYRINGE) ×1 IMPLANT
SYR CONTROL 10ML LL (SYRINGE) IMPLANT
SYR TB 1ML LUER SLIP (SYRINGE) ×1 IMPLANT
TOWEL GREEN STERILE (TOWEL DISPOSABLE) ×1 IMPLANT
TOWEL GREEN STERILE FF (TOWEL DISPOSABLE) ×1 IMPLANT
TUBE CONNECTING 12X1/4 (SUCTIONS) ×1 IMPLANT
TUBING ARTHROSCOPY IRRIG 16FT (MISCELLANEOUS) ×1 IMPLANT
WATER STERILE IRR 1000ML POUR (IV SOLUTION) ×1 IMPLANT

## 2023-09-17 NOTE — Brief Op Note (Signed)
   Brief Op Note  Date of Surgery: 09/17/2023  Preoperative Diagnosis: LEFT KNEE MEDIAL MENISCUS TEAR  Postoperative Diagnosis: same  Procedure: Procedure(s): LEFT KNEE ARTHROSCOPY WITH MENISCAL REPAIR  Implants: * No implants in log *  Surgeons: Surgeon(s): Huel Cote, MD  Anesthesia: General    Estimated Blood Loss: See anesthesia record  Complications: None  Condition to PACU: Stable  Benancio Deeds, MD 09/17/2023 5:55 PM

## 2023-09-17 NOTE — Significant Event (Signed)
Patient was verbally consented after discussion of risks and benefits in the preoperative area.  This was documented in my preoperative H&P.  Signature was not obtained prior to entering the OR.  Prior to starting the procedure for thoroughness, the patient's friend Fayrene Fearing was contacted and verbal consent was again confirmed on her behalf.

## 2023-09-17 NOTE — Transfer of Care (Signed)
Immediate Anesthesia Transfer of Care Note  Patient: [name] [name]  Procedure(s) Performed: LEFT KNEE ARTHROSCOPY WITH MEDIAL MENISCAL REPAIR (Left: Knee)  Patient Location: PACU  Anesthesia Type:General  Level of Consciousness: awake, alert , and oriented  Airway & Oxygen Therapy: Patient Spontanous Breathing and Patient connected to face mask oxygen  Post-op Assessment: Report given to RN and Post -op Vital signs reviewed and stable  Post vital signs: Reviewed and stable  Last Vitals:  Vitals Value Taken Time  BP 140/89 09/17/23 1815  Temp 36.6 C 09/17/23 1811  Pulse 106 09/17/23 1822  Resp 17 09/17/23 1822  SpO2 96 % 09/17/23 1822  Vitals shown include unfiled device data.  Last Pain:  Vitals:   09/17/23 1811  TempSrc:   PainSc: Asleep      Patients Stated Pain Goal: 2 (09/17/23 1405)  Complications: No notable events documented.

## 2023-09-17 NOTE — H&P (Signed)
Chief Complaint: Left knee medial meniscal injury        History of Present Illness:    08/14/2023: Presents today for follow-up with additional questions of the left knee.   [name] [name] is a 47 y.o. female presents today with persistent left medial based knee pain in the setting of an injury that was subsequently exacerbated in rehab when she felt a pop in his left knee.  Since that time she has had pain and swelling.  She states that this will occasionally lock up and is quite excruciating when this happens.  She has been working in physical therapy diligently and is getting stronger which is helping out her contralateral side in terms of strength but persistently having left knee pain.  She is here for additional opinion as she was indicated for a left knee meniscal procedure.  She has additional questions regarding this.       Surgical History:   None   PMH/PSH/Family History/Social History/Meds/Allergies:         Past Medical History:  Diagnosis Date   Adenomatous colon polyp 10/22/2006   Anxiety 1999/2000   Arthritis      "jaw joints; neck" (07/28/2014)   Complication of anesthesia      "didn't wake up well/remembered stuff from during OR when I had pituitary surgery"   Constipation     Depression      hx   DVT (deep vein thrombosis) in pregnancy 09/21/2013    "LLE; postpartum"   Fibromyalgia     GERD (gastroesophageal reflux disease)     H/O hiatal hernia     Hematochezia     IBS (irritable bowel syndrome)     Internal hemorrhoids     Migraines      "couple times/wk" (07/28/2014)   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 Stan Head   ESOPHAGOGASTRODUODENOSCOPY   09/29/2004    Dr. Karolee Ohs   LAPAROSCOPIC APPENDECTOMY N/A 07/28/2014    Procedure: APPENDECTOMY LAPAROSCOPIC;  Surgeon: Chevis Pretty III, MD;  Location: [facility] OR;  Service: General;  Laterality: N/A;   TRANSPHENOIDAL / TRANSNASAL HYPOPHYSECTOMY / RESECTION PITUITARY TUMOR   1999    resection of benign pituitary tumor, [facility]   UTERINE FIBROID SURGERY   2008        Social History         Socioeconomic History   Marital status: Unknown      Spouse name: Not on file   Number of children: 3   Years of education: College   Highest education level: Not on file  Occupational History   Occupation: Homemaker  Tobacco Use   Smoking status: Never   Smokeless tobacco: Never  Substance and Sexual Activity   Alcohol use: Yes      Alcohol/week: 1.0 standard drink of alcohol      Types: 1 Glasses of wine per week      Comment: Rarely - less than one drink every few months   Drug use: No   Sexual activity: Yes  Other Topics Concern   Not on file  Social History Narrative    Lives at home with her husband and three children.    Right-handed.    Rare caffeine use.    Social Determinants of Health    Financial Resource Strain: Not on file  Food Insecurity: Not on file  Transportation Needs: Not on file  Physical Activity: Not on file  Stress: Not on file  Social Connections: Not on file         Family History  Problem Relation Age of Onset   Heart disease Mother     Diabetes Mother     Heart disease Father     Prostate cancer Father     Alzheimer's disease Father     Colon cancer Maternal Grandmother     Heart disease Other     Prostate cancer Other     Diabetes Other     Autism Child          2 of 3 daughters have high functioning Autism   Breast cancer Paternal Aunt          Allergies       Allergies  Allergen Reactions   Latex Rash   Acetaminophen Other (See Comments)      Itchy, hives, rash, stomach pain, nausea    Influenza Vaccines     Metoprolol Other (See Comments)      dizziness   Venlafaxine        Reports one time seizure on this medication (occurred 20+ years ago)   Verapamil Nausea Only      Headache, dizziness            Current Outpatient Medications  Medication Sig Dispense Refill   amphetamine-dextroamphetamine (ADDERALL) 20 MG tablet Take 20 mg by mouth 2 (two) times daily as needed (adhd).       aspirin EC 325 MG tablet Take 1 tablet (325 mg total) by mouth daily. 14 tablet 0   botulinum toxin Type A (BOTOX) 200 units injection Inject 155 units into the head and neck every 3 months. 1 each 0   CANNABIDIOL PO Take 2 tablets by mouth daily as needed (pain relief).       Cholecalciferol (VITAMIN D PO) Take 10,000 Units by mouth every other day.       Ferrous Sulfate (IRON PO) Take 1 tablet by mouth daily as needed (low iron).       ibuprofen (ADVIL) 800 MG tablet Take 1 tablet (800 mg total) by mouth 3 (three) times daily. 30 tablet 1   lidocaine (HM LIDOCAINE PATCH) 4 % Place 1 patch onto the skin daily. 5% lidocaine patch 30 patch 1   LORazepam (ATIVAN) 0.5 MG tablet 1-2 tabs 30 - 60 min prior to MRI. Do not drive with this medicine. 4 tablet 0   Multiple Vitamin (MULTIVITAMIN) capsule Take 1 capsule by mouth daily.       oxyCODONE (ROXICODONE) 5 MG immediate release tablet Take 1 tablet (5 mg total) by mouth every 4 (four) hours as needed for severe pain or breakthrough pain. 5 tablet 0   scopolamine (TRANSDERM-SCOP) 1 MG/3DAYS Place 1 patch (1.5 mg total) onto the skin every 3 (three) days. 10 patch 12   SUMAtriptan (IMITREX) 100 MG tablet May repeat in 2 hours if headache persists or recurs. 12 tablet 11               Current Facility-Administered Medications  Medication Dose Route Frequency Provider Last  Rate Last Admin   botulinum toxin Type A (BOTOX) injection 155 Units  155 Units Intramuscular Once Levert Feinstein, MD       botulinum toxin Type A (BOTOX) injection 155 Units   155 Units Intramuscular Once Levert Feinstein, MD       botulinum toxin Type A (BOTOX) injection 155 Units  155 Units Intramuscular Once Levert Feinstein, MD       botulinum toxin Type A (BOTOX) injection 200 Units  200 Units Intramuscular Once Levert Feinstein, MD       botulinum toxin Type A (BOTOX) injection 200 Units  200 Units Intramuscular Once Levert Feinstein, MD          Imaging Results (Last 48 hours)  No results found.     Review of Systems:   A ROS was performed including pertinent positives and negatives as documented in the HPI.   Physical Exam :   Constitutional: NAD and appears stated age Neurological: Alert and oriented Psych: Appropriate affect and cooperative unknown if currently breastfeeding.    Comprehensive Musculoskeletal Exam:           Musculoskeletal Exam  Gait Normal  Alignment Normal    Right Left  Inspection Normal Normal  Palpation      Tenderness None Medial tibiofemoral  Crepitus None None  Effusion None Trace  Range of Motion      Extension -3 -3  Flexion 135 135  Strength      Extension 5/5 5/5  Flexion 5/5 5/5  Ligament Exam        Generalized Laxity No No  Lachman Negative Negative   Pivot Shift Negative Negative  Anterior Drawer Negative Negative  Valgus at 0 Negative Negative  Valgus at 20 Negative Negative  Varus at 0 0 0  Varus at 20   0 0  Posterior Drawer at 90 0 0  Vascular/Lymphatic Exam      Edema None None  Venous Stasis Changes No No  Distal Circulation Normal Normal  Neurologic      Light Touch Sensation Intact Intact  Special Tests: Positive McMurray early left knee        Imaging:       MRI (left knee): There is a complex tear which is horizontal in nature involving the medial meniscus   I personally reviewed and interpreted the radiographs.     Assessment:   47 y.o. female with a left knee medial meniscal complex tear.  Given the amount of her meniscus involved I did ultimately discuss that I do believe she would benefit  from a medial meniscal repair with horizontal compression stitch.  Overall she has been attempting physical therapy with little to no relief.  This is occasionally locking causing her quite significant pain.  Given that we did discuss the process of medial meniscal repair.  I do believe that she would be a candidate for early weightbearing.  I did discuss the complications associated with this and after all of this she has elected for left knee arthroscopy with medial meniscal repair   Plan :     -Plan for left knee arthroscopy with medial meniscal repair     After a lengthy discussion of treatment options, including risks, benefits, alternatives, complications of surgical and nonsurgical conservative options, the patient elected surgical repair.    The patient  is aware of the material risks  and complications including, but not limited to injury to adjacent structures, neurovascular injury, infection, numbness, bleeding, implant failure, thermal burns,  stiffness, persistent pain, failure to heal, disease transmission from allograft, need for further surgery, dislocation, anesthetic risks, blood clots, risks of death,and others. The probabilities of surgical success and failure discussed with patient given their particular co-morbidities.The time and nature of expected rehabilitation and recovery was discussed.The patient's questions were all answered preoperatively.  No barriers to understanding were noted. I explained the natural history of the disease process and Rx rationale.  I explained to the patient what I considered to be reasonable expectations given their personal situation.  The final treatment plan was arrived at through a shared patient decision making process model.           I personally saw and evaluated the patient, and participated in the management and treatment plan.   Huel Cote, MD Attending Physician, Orthopedic Surgery   This document was dictated using Dragon voice  recognition software. A reasonable attempt at proof reading has been made to minimize errors.

## 2023-09-17 NOTE — Op Note (Signed)
Date of Surgery: 09/17/2023  INDICATIONS: [name] [name] is a 47 y.o.-year-old female with left medial meniscal tear.  The risk and benefits of the procedure were discussed in detail and documented in the pre-operative evaluation.   PREOPERATIVE DIAGNOSIS: 1.  Left medial meniscal tear  POSTOPERATIVE DIAGNOSIS: Same.  PROCEDURE: 1.  Left medial meniscal repair  SURGEON: Benancio Deeds MD  ASSISTANT: Kerby Less, ATC  ANESTHESIA:  general +20 cc 0.25% Marcaine  IV FLUIDS AND URINE: See anesthesia record.  ANTIBIOTICS: Ancef  ESTIMATED BLOOD LOSS: 5 mL.  IMPLANTS:  FasT-Fix all inside x 1  DRAINS: None  CULTURES: None  COMPLICATIONS: none  DESCRIPTION OF PROCEDURE:   Examination under anesthesia: A careful examination under anesthesia was performed.  Knee ROM motion was: -3  - 130 Lachman: Normal Pivot Shift: Normal Posterior drawer: normal Varus stability in full extension: normal Varus stability in 30 degrees of flexion: normal Valgus stability in full extension: normal Valgus stability in 30 degrees of flexion: normal Posterolateral drawer: normal   Intra-operative findings: A thorough arthroscopic examination of the knee was performed.  The findings are: 1. Suprapatellar pouch: Normal 2. Undersurface of median ridge: Normal 3. Medial patellar facet: Normal 4. Lateral patellar facet: Normal 5. Trochlea: Normal 6. Lateral gutter/popliteus tendon: Normal 7. Hoffa's fat pad: Normal 8. Medial gutter/plica: Normal 9. ACL: Normal 10. PCL: Normal 11. Medial meniscus: Longitudinal tear involving the peripheral rim of the posterior third 12. Medial compartment cartilage: Normal 13. Lateral meniscus: Normal 14. Lateral compartment cartilage: Normal      I identified the patient in the pre-operative holding area.  I marked the operative knee with my initials. I reviewed the risks and benefits of the proposed surgical intervention and the patient wished to proceed.  Anesthesia was then performed with regional block.  The patient was transferred to the operative suite and placed in the supine position with all bony prominences padded.     SCDs were placed on the non-operative lower extremity. Appropriate antibiotics was administered within 1 hour before incision. The operative extremity was then prepped and draped in standard fashion. A time out was performed confirming the correct extremity, correct patient and correct procedure. A tourniquet was placed but not inflated.  Final timeout was performed. Arthroscopy portals were marked and portals were established with an 11 blade.  A diagnostic arthroscopy was performed with findings above.   There was a medial meniscal tear involving the horizontal surface.  There is an undersurface component of this that was flipped into the joint.  This was debrided back to healthy meniscus.  There is approximately 80% remaining meniscal rim.  At this point there was still a horizontal cleavage component and as result and all inside FasT-Fix was used in a hay bail fashion at the mid body.  This provided excellent apposition.  The wounds were closed with 3-0 nylon in the skin.  The other incisions were closed superficially with 2-0 vicryl and 3-0 nylon.Xeroform, gauze, webril, and Ace wrap, Iceman, and brace locked at 0 degrees were applied to the knee.     Instrument, sponge, and needle counts were correct prior to wound closure and at the conclusion of the case.   The patient awoke from anesthesia without difficulty and was transferred to the PACU in stable condition.      POSTOPERATIVE PLAN: She will be weightbearing as tolerated on the left knee.  She will be activity and range of motion as tolerated.  I will plan to  place her on 2 weeks of aspirin for blood clot prevention.  I will see her back in 2 weeks for suture removal  Benancio Deeds, MD 5:55 PM

## 2023-09-17 NOTE — Telephone Encounter (Signed)
Pharmacy Patient Advocate Encounter   Received notification from RX Request Messages that prior authorization for Butalbital-APAP-Caffeine 50-300-40MG  capsules is required/requested.   Insurance verification completed.   The patient is insured through [facility] .   Per test claim: PA required; PA submitted to above mentioned insurance via CoverMyMeds Key/confirmation #/EOC [id number] Status is pending

## 2023-09-17 NOTE — Anesthesia Preprocedure Evaluation (Addendum)
Anesthesia Evaluation  Patient identified by MRN, date of birth, ID band Patient awake    Reviewed: Allergy & Precautions, NPO status , Patient's Chart, lab work & pertinent test results  History of Anesthesia Complications (+) PONV and history of anesthetic complications  Airway Mallampati: IV  TM Distance: >3 FB Neck ROM: Full    Dental  (+) Dental Advisory Given Right bottom has temp crown, sensitive to air and cold- sore when opening mouth:   Pulmonary sleep apnea (noncompliant w/ CPAP)    breath sounds clear to auscultation       Cardiovascular + DVT (2014 postpartum)  Normal cardiovascular exam Rhythm:Regular Rate:Normal     Neuro/Psych  Headaches PSYCHIATRIC DISORDERS Anxiety Depression       GI/Hepatic Neg liver ROS, hiatal hernia,GERD  ,,  Endo/Other  negative endocrine ROS    Renal/GU negative Renal ROS  negative genitourinary   Musculoskeletal  (+) Arthritis , Osteoarthritis,  Fibromyalgia -  Abdominal   Peds  Hematology negative hematology ROS (+)   Anesthesia Other Findings   Reproductive/Obstetrics negative OB ROS                             Anesthesia Physical Anesthesia Plan  ASA: 2  Anesthesia Plan: General   Post-op Pain Management: Toradol IV (intra-op)*   Induction: Intravenous  PONV Risk Score and Plan: 4 or greater and Ondansetron, Dexamethasone, Midazolam, Scopolamine patch - Pre-op and Treatment may vary due to age or medical condition  Airway Management Planned: LMA  Additional Equipment: None  Intra-op Plan:   Post-operative Plan: Extubation in OR  Informed Consent: I have reviewed the patients History and Physical, chart, labs and discussed the procedure including the risks, benefits and alternatives for the proposed anesthesia with the patient or authorized representative who has indicated his/her understanding and acceptance.     Dental  advisory given  Plan Discussed with: CRNA  Anesthesia Plan Comments:         Anesthesia Quick Evaluation
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
## 2023-09-17 NOTE — Anesthesia Procedure Notes (Signed)
Procedure Name: LMA Insertion Date/Time: 09/17/2023 5:17 PM  Performed by: Thomasene Ripple, CRNAPre-anesthesia Checklist: Patient identified, Emergency Drugs available, Suction available and Patient being monitored Patient Re-evaluated:Patient Re-evaluated prior to induction Oxygen Delivery Method: Circle System Utilized Preoxygenation: Pre-oxygenation with 100% oxygen Induction Type: IV induction Ventilation: Mask ventilation without difficulty LMA: LMA inserted LMA Size: 4.0 Number of attempts: 1 Airway Equipment and Method: Bite block Placement Confirmation: positive ETCO2 Tube secured with: Tape Dental Injury: Teeth and Oropharynx as per pre-operative assessment

## 2023-09-17 NOTE — Telephone Encounter (Signed)
Requested Prescriptions   Pending Prescriptions Disp Refills   Butalbital-APAP-Caffeine 50-300-40 MG CAPS [Pharmacy Med Name: BUTALB-ACETAMIN-CAFF 50-300-40] 12 capsule 5    Sig: TAKE 1 CAPSULE BY MOUTH DAILY AS NEEDED. DO NOT REFILL IN LESS THAN 30 DAYS   Last seen 09/11/23 Next appt scheduled 12/11/23 Butalbital-APAP-Caffeine Adherence No adherence information is available for this medication yet.

## 2023-09-17 NOTE — Discharge Instructions (Signed)
Discharge Instructions    Attending Surgeon: Huel Cote, MD Office Phone Number: [phone]   Diagnosis and Procedures:    Surgeries Performed: Left knee medial meniscal repair  Discharge Plan:    Diet: Resume usual diet. Begin with light or bland foods.  Drink plenty of fluids.  Activity:  Weightbearing as tolerated. You are advised to go home directly from the hospital or surgical center. Restrict your activities.  GENERAL INSTRUCTIONS: 1.  Please apply ice to your wound to help with swelling and inflammation. This will improve your comfort and your overall recovery following surgery.     2. Please call Dr. Serena Croissant office at [phone] with questions Monday-Friday during business hours. If no one answers, please leave a message and someone should get back to the patient within 24 hours. For emergencies please call 911 or proceed to the emergency room.   3. Patient to notify surgical team if experiences any of the following: Bowel/Bladder dysfunction, uncontrolled pain, nerve/muscle weakness, incision with increased drainage or redness, nausea/vomiting and Fever greater than 101.0 F.  Be alert for signs of infection including redness, streaking, odor, fever or chills. Be alert for excessive pain or bleeding and notify your surgeon immediately.  WOUND INSTRUCTIONS:   Leave your dressing, cast, or splint in place until your post operative visit.  Keep it clean and dry.  Always keep the incision clean and dry until the staples/sutures are removed. If there is no drainage from the incision you should keep it open to air. If there is drainage from the incision you must keep it covered at all times until the drainage stops  Do not soak in a bath tub, hot tub, pool, lake or other body of water until 21 days after your surgery and your incision is completely dry and healed.  If you have removable sutures (or staples) they must be removed 10-14 days (unless otherwise  instructed) from the day of your surgery.     1)  Elevate the extremity as much as possible.  2)  Keep the dressing clean and dry.  3)  Please call us if the dressing becomes wet or dirty.  4)  If you are experiencing worsening pain or worsening swelling, please call.     MEDICATIONS: Resume all previous home medications at the previous prescribed dose and frequency unless otherwise noted Start taking the  pain medications on an as-needed basis as prescribed  Please taper down pain medication over the next week following surgery.  Ideally you should not require a refill of any narcotic pain medication.  Take pain medication with food to minimize nausea. In addition to the prescribed pain medication, you may take over-the-counter pain relievers such as Tylenol.  Do NOT take additional tylenol if your pain medication already has tylenol in it.  Aspirin 325mg  daily per instructions on bottle. Narcotic policy: Per [facility] clinic policy, our goal is ensure optimal postoperative pain control with a multimodal pain management strategy. For all [facility] patients, our goal is to wean post-operative narcotic medications by 6 weeks post-operatively, and many times sooner. If this is not possible due to utilization of pain medication prior to surgery, your [facility] doctor will support your acute post-operative pain control for the first 6 weeks postoperatively, with a plan to transition you back to your primary pain team following that. Cyndia Skeeters will work to ensure a Therapist, occupational.       FOLLOWUP INSTRUCTIONS: 1. Follow up at the Physical Therapy Clinic 3-4  days following surgery. This appointment should be scheduled unless other arrangements have been made.The Physical Therapy scheduling number is [phone] if an appointment has not already been arranged.  2. Contact Dr. Serena Croissant office during office hours at [phone] or the practice after hours line at [phone] for non-emergencies.  For medical emergencies call 911.   Discharge Location: Home

## 2023-09-18 ENCOUNTER — Encounter (HOSPITAL_COMMUNITY): Payer: Self-pay | Admitting: Orthopaedic Surgery

## 2023-09-19 NOTE — Anesthesia Postprocedure Evaluation (Signed)
Anesthesia Post Note  Patient: [name] [name]  Procedure(s) Performed: LEFT KNEE ARTHROSCOPY WITH MEDIAL MENISCAL REPAIR (Left: Knee)     Patient location during evaluation: PACU Anesthesia Type: General Level of consciousness: awake and alert Pain management: pain level controlled Vital Signs Assessment: post-procedure vital signs reviewed and stable Respiratory status: spontaneous breathing, nonlabored ventilation, respiratory function stable and patient connected to nasal cannula oxygen Cardiovascular status: blood pressure returned to baseline and stable Postop Assessment: no apparent nausea or vomiting Anesthetic complications: no   No notable events documented.  Last Vitals:  Vitals:   09/17/23 1915 09/17/23 1917  BP: 125/84 130/80  Pulse: 69 64  Resp: 11 12  Temp:  36.6 C  SpO2: 96% 99%    Last Pain:  Vitals:   09/17/23 1917  TempSrc:   PainSc: 5                  Kennieth Rad

## 2023-09-21 ENCOUNTER — Other Ambulatory Visit: Payer: Self-pay

## 2023-09-21 ENCOUNTER — Ambulatory Visit (HOSPITAL_BASED_OUTPATIENT_CLINIC_OR_DEPARTMENT_OTHER): Payer: 59 | Attending: Family Medicine | Admitting: Physical Therapy

## 2023-09-21 ENCOUNTER — Encounter (HOSPITAL_BASED_OUTPATIENT_CLINIC_OR_DEPARTMENT_OTHER): Payer: Self-pay | Admitting: Physical Therapy

## 2023-09-21 DIAGNOSIS — M6281 Muscle weakness (generalized): Secondary | ICD-10-CM | POA: Insufficient documentation

## 2023-09-21 DIAGNOSIS — M25562 Pain in left knee: Secondary | ICD-10-CM | POA: Insufficient documentation

## 2023-09-21 DIAGNOSIS — S83242A Other tear of medial meniscus, current injury, left knee, initial encounter: Secondary | ICD-10-CM | POA: Insufficient documentation
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
## 2023-09-23 DIAGNOSIS — G44209 Tension-type headache, unspecified, not intractable: Secondary | ICD-10-CM | POA: Insufficient documentation

## 2023-09-23 NOTE — Telephone Encounter (Signed)
Pharmacy Patient Advocate Encounter  Received notification from [facility] that Prior Authorization for Butalbital-APAP-Caffeine 50-300-40MG  capsules has been DENIED.  Full denial letter will be uploaded to the media tab. See denial reason below.   PA #/Case ID/Reference #: PA Case ID #: [id number]

## 2023-09-23 NOTE — Telephone Encounter (Signed)
Pharmacy Patient Advocate Encounter   Received notification from Physician's Office that prior authorization for Butalbital-APAP-Caffeine 50-300-40MG  Capsules is required/requested.   Insurance verification completed.   The patient is insured through [facility] .   Per test claim: PA required; PA submitted to above mentioned insurance via CoverMyMeds Key/confirmation #/EOC [id number] Status is pending  It did not ask for the diagnosis-so fingers crossed it will get approved just by chart notes.

## 2023-09-23 NOTE — Telephone Encounter (Signed)
She has both headaches, chronic migraine and also tension headaches,  I added on medical diagnosis:, Please restart prior authorization

## 2023-09-24 NOTE — Telephone Encounter (Signed)
Can the pt try these instead since on formulary:

## 2023-09-24 NOTE — Telephone Encounter (Signed)
I received a form via Fax requesting more info on an appeal that was started for this PT-Unsure who submitted an appeal (It appears the PT did it)-but they are asking the following question-time sensitive-I could not find in chart-Please advise.        This was when I tried to resubmit once the provider changed the diagnosis because they originally denied it due to not covering for Migraine etc.

## 2023-09-25 NOTE — Telephone Encounter (Addendum)
She can use   No.1 and 2,  highlighted as alteranative

## 2023-09-26 MED ORDER — BUTALBITAL-APAP-CAFFEINE 50-325-40 MG PO TABS: 1.0000 | ORAL_TABLET | Freq: Four times a day (QID) | ORAL | 5 refills | Status: AC | PRN

## 2023-09-26 NOTE — Addendum Note (Signed)
Addended by: Levert Feinstein on: 09/26/2023 01:40 PM   Modules accepted: Orders

## 2023-09-26 NOTE — Telephone Encounter (Signed)
Meds ordered this encounter  Medications   butalbital-acetaminophen-caffeine (FIORICET) 50-325-40 MG tablet    Sig: Take 1 tablet by mouth every 6 (six) hours as needed for headache.    Dispense:  10 tablet    Refill:  5     New generic Rx was sent in

## 2023-09-27 ENCOUNTER — Ambulatory Visit (HOSPITAL_BASED_OUTPATIENT_CLINIC_OR_DEPARTMENT_OTHER): Payer: 59 | Attending: Family Medicine | Admitting: Physical Therapy

## 2023-09-27 DIAGNOSIS — G8929 Other chronic pain: Secondary | ICD-10-CM | POA: Insufficient documentation

## 2023-09-27 DIAGNOSIS — M6281 Muscle weakness (generalized): Secondary | ICD-10-CM | POA: Diagnosis present

## 2023-09-27 DIAGNOSIS — R2689 Other abnormalities of gait and mobility: Secondary | ICD-10-CM | POA: Insufficient documentation

## 2023-09-27 DIAGNOSIS — M25562 Pain in left knee: Secondary | ICD-10-CM | POA: Diagnosis present

## 2023-09-28 ENCOUNTER — Ambulatory Visit (HOSPITAL_BASED_OUTPATIENT_CLINIC_OR_DEPARTMENT_OTHER): Payer: 59 | Admitting: Physical Therapy

## 2023-09-29 ENCOUNTER — Encounter (HOSPITAL_BASED_OUTPATIENT_CLINIC_OR_DEPARTMENT_OTHER): Payer: Self-pay | Admitting: Physical Therapy
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
## 2023-09-30 ENCOUNTER — Telehealth (HOSPITAL_BASED_OUTPATIENT_CLINIC_OR_DEPARTMENT_OTHER): Payer: Self-pay | Admitting: Orthopaedic Surgery

## 2023-09-30 NOTE — Telephone Encounter (Signed)
Patient states that she can not do her appointment of 12/12 and wants to be doubled booked and wants to come in on Thursday or Friday and sit and wait. I advised her that does not mean that she will be seen by DR B. She proceed to ask for April the surgery sch number as if she can sch her. I transferred her to [address]. I advised her I will not double book unless I authorization because there are a lot of patients on Dr B sch

## 2023-10-01 ENCOUNTER — Ambulatory Visit (HOSPITAL_BASED_OUTPATIENT_CLINIC_OR_DEPARTMENT_OTHER): Payer: 59 | Admitting: Physical Therapy

## 2023-10-01 ENCOUNTER — Telehealth (HOSPITAL_BASED_OUTPATIENT_CLINIC_OR_DEPARTMENT_OTHER): Payer: Self-pay | Admitting: Orthopaedic Surgery

## 2023-10-01 NOTE — Telephone Encounter (Signed)
Called patient to leave a message to call the office to and sch an appointment  with Jean Rosenthal on 12/12/ if that is not ok I will offer her an appointment with in  [place] with Dr B

## 2023-10-02 ENCOUNTER — Ambulatory Visit (HOSPITAL_BASED_OUTPATIENT_CLINIC_OR_DEPARTMENT_OTHER): Payer: 59 | Admitting: Physical Therapy

## 2023-10-02 ENCOUNTER — Encounter (HOSPITAL_BASED_OUTPATIENT_CLINIC_OR_DEPARTMENT_OTHER): Payer: Self-pay | Admitting: Physical Therapy

## 2023-10-02 DIAGNOSIS — M6281 Muscle weakness (generalized): Secondary | ICD-10-CM | POA: Diagnosis not present

## 2023-10-02 DIAGNOSIS — G8929 Other chronic pain: Secondary | ICD-10-CM

## 2023-10-02 DIAGNOSIS — M25562 Pain in left knee: Secondary | ICD-10-CM

## 2023-10-02 DIAGNOSIS — R2689 Other abnormalities of gait and mobility: Secondary | ICD-10-CM
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
## 2023-10-03 ENCOUNTER — Encounter (HOSPITAL_BASED_OUTPATIENT_CLINIC_OR_DEPARTMENT_OTHER): Payer: 59 | Admitting: Student

## 2023-10-03 ENCOUNTER — Encounter (HOSPITAL_BASED_OUTPATIENT_CLINIC_OR_DEPARTMENT_OTHER): Payer: 59 | Admitting: Physical Therapy

## 2023-10-03 ENCOUNTER — Encounter (HOSPITAL_BASED_OUTPATIENT_CLINIC_OR_DEPARTMENT_OTHER): Payer: 59 | Admitting: Orthopaedic Surgery

## 2023-10-07 ENCOUNTER — Encounter (HOSPITAL_BASED_OUTPATIENT_CLINIC_OR_DEPARTMENT_OTHER): Payer: Self-pay | Admitting: Student

## 2023-10-07 ENCOUNTER — Ambulatory Visit (HOSPITAL_BASED_OUTPATIENT_CLINIC_OR_DEPARTMENT_OTHER): Payer: 59 | Admitting: Student

## 2023-10-07 DIAGNOSIS — S83242A Other tear of medial meniscus, current injury, left knee, initial encounter: Secondary | ICD-10-CM

## 2023-10-07 NOTE — Progress Notes (Signed)
Post Operative Evaluation    Procedure/Date of Surgery: Left knee arthroscopy with medial meniscus repair 09/17/2023  Interval History:   Patient presents today 3 weeks status post left medial meniscal repair.  Overall she reports doing extremely well.  Reports some mild pain and swelling particularly after physical therapy or performing exercises at home.  Has been taking ibuprofen and icing as needed.  Denies any concerns with surgical incisions and no fever, redness, or chills.   PMH/PSH/Family History/Social History/Meds/Allergies:    Past Medical History:  Diagnosis Date   Adenomatous colon polyp 10/22/2006   Anemia    Anxiety 1999/2000   Arthritis    "jaw joints; neck" (07/28/2014)   Complication of anesthesia    "didn't wake up well/remembered stuff from during OR when I had pituitary surgery"   Constipation    Depression    hx   DVT (deep vein thrombosis) in pregnancy 09/21/2013   "LLE; postpartum"   Fibromyalgia    GERD (gastroesophageal reflux disease)    H/O hiatal hernia    Hematochezia    Hypoglycemia    IBS (irritable bowel syndrome)    Internal hemorrhoids    Migraines    "couple times/wk" (07/28/2014)   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 Stan Head   ESOPHAGOGASTRODUODENOSCOPY  09/29/2004   Dr. Karolee Ohs   KNEE ARTHROSCOPY WITH MENISCAL REPAIR Left 09/17/2023   Procedure: LEFT KNEE ARTHROSCOPY WITH MEDIAL MENISCAL REPAIR;  Surgeon: Huel Cote, MD;  Location: [facility] OR;  Service: Orthopedics;  Laterality: Left;   LAPAROSCOPIC APPENDECTOMY N/A 07/28/2014   Procedure: APPENDECTOMY  LAPAROSCOPIC;  Surgeon: Chevis Pretty III, MD;  Location: [facility] OR;  Service: General;  Laterality: N/A;   NASAL SINUS SURGERY     TEMPOROMANDIBULAR JOINT SURGERY     TRANSPHENOIDAL / TRANSNASAL HYPOPHYSECTOMY / RESECTION PITUITARY TUMOR  10/22/1997   [facility]section [facility] benign [facility], [facility]   UTERINE FIBROID SURGERY  10/22/2006   Social History   Socioeconomic History   Marital status: Unknown    Spouse name: Not on file   Number of children: 3   Years of education: College   Highest education level: Not on file  Occupational History   Occupation: Homemaker  Tobacco Use   Smoking status: Never   Smokeless tobacco: Never  Substance and Sexual Activity   Alcohol use: Yes    Alcohol/week: 1.0 standard drink of alcohol    Types: 1 Glasses of wine per week    Comment: Rarely - less than one drink every few months   Drug use: No   Sexual activity: Yes  Other Topics Concern   Not on file  Social History Narrative   Lives at home with her husband and three children.   Right-handed.   Rare caffeine use.   Social Drivers of Corporate investment banker Strain: Not on file  Food Insecurity: Not on file  Transportation Needs: Not on file  Physical  Activity: Not on file  Stress: Not on file  Social Connections: Not on file   Family History  Problem Relation Age of Onset   Heart disease Mother    Diabetes Mother    Heart disease Father    Prostate cancer Father    Alzheimer's disease Father    Colon cancer Maternal Grandmother    Heart disease Other    Prostate cancer Other    Diabetes Other    Autism Child        2 of 3 daughters have high functioning Autism   Breast cancer Paternal Aunt    Allergies  Allergen Reactions   Latex Rash   Acetaminophen Other (See Comments)    Itchy, hives, rash, stomach pain, nausea   Influenza Vaccines    Metoprolol Other (See Comments)    dizziness   Venlafaxine     Reports one time seizure on this medication (occurred 20+  years ago)   Verapamil Nausea Only    Headache, dizziness   Current Outpatient Medications  Medication Sig Dispense Refill   amphetamine-dextroamphetamine (ADDERALL XR) 20 MG 24 hr capsule Take 20 mg by mouth daily as needed (adhd).     amphetamine-dextroamphetamine (ADDERALL) 20 MG tablet Take 20 mg by mouth 2 (two) times daily as needed (adhd).     aspirin EC 325 MG tablet Take 1 tablet (325 mg total) by mouth daily. 14 tablet 0   botulinum toxin Type A (BOTOX) 200 units injection Inject 155 units into the head and neck every 3 months. 1 each 0   butalbital-acetaminophen-caffeine (FIORICET) 50-325-40 MG tablet Take 1 tablet by mouth every 6 (six) hours as needed for headache. 10 tablet 5   Butalbital-APAP-Caffeine 50-300-40 MG CAPS TAKE 1 CAPSULE BY MOUTH DAILY AS NEEDED. DO NOT REFILL IN LESS THAN 30 DAYS (Patient not taking: Reported on 09/17/2023) 12 capsule 5   CANNABIDIOL PO Take 2 tablets by mouth daily as needed (pain relief).     cyclobenzaprine (FLEXERIL) 5 MG tablet Take 5 mg by mouth 3 (three) times daily as needed for muscle spasms.     Ferrous Sulfate (IRON PO) Take 1 tablet by mouth daily as needed (low iron).     fluticasone (FLONASE) 50 MCG/ACT nasal spray Place 2 sprays into both nostrils daily as needed for allergies.     lidocaine (HM LIDOCAINE PATCH) 4 % Place 1 patch onto the skin daily. 5% lidocaine patch (Patient not taking: Reported on 09/17/2023) 30 patch 1   oxyCODONE (ROXICODONE) 5 MG immediate release tablet Take 1 tablet (5 mg total) by mouth every 4 (four) hours as needed for severe pain (pain score 7-10) or breakthrough pain. 5 tablet 0   progesterone (PROMETRIUM) 100 MG capsule Take 100 mg by mouth daily as needed (hormone).     scopolamine (TRANSDERM-SCOP) 1 MG/3DAYS Place 1 patch (1.5 mg total) onto the skin every 3 (three) days. (Patient not taking: Reported on 09/17/2023) 10 patch 12   SUMAtriptan (IMITREX) 100 MG tablet May repeat in 2 hours if headache  persists or recurs. 12 tablet 11   Current Facility-Administered Medications  Medication Dose Route Frequency Provider Last Rate Last Admin   botulinum toxin Type A (BOTOX) injection 155 Units  155 Units Intramuscular Once Levert Feinstein, MD       botulinum toxin Type A (BOTOX) injection 155 Units  155 Units Intramuscular Once Levert Feinstein, MD       botulinum toxin Type A (BOTOX) injection 155 Units  155  Units Intramuscular Once Levert Feinstein, MD       botulinum toxin Type A (BOTOX) injection 200 Units  200 Units Intramuscular Once Levert Feinstein, MD       botulinum toxin Type A (BOTOX) injection 200 Units  200 Units Intramuscular Once Levert Feinstein, MD       No results found.  Review of Systems:   A ROS was performed including pertinent positives and negatives as documented in the HPI.   Musculoskeletal Exam:    Last menstrual period 09/03/2023, unknown if currently breastfeeding.  Left knee arthroscopic incisions are well-appearing without evidence of erythema or drainage.  Active range of motion from 0 to 100 degrees without significant discomfort.  Patient ambulating well with mild residual antalgic gait.  Imaging:     Assessment:   3 weeks status post left knee arthroscopy with medial meniscus repair overall doing extremely well.  Has been working with physical therapy and her range of motion is progressing nicely.  Sutures were removed today as incisions are well-appearing.  She can continue progressing with physical therapy per protocol and return to follow-up with Dr. Steward Drone in 4 weeks.  Plan :    -Return to clinic in 4 weeks      I personally saw and evaluated the patient, and participated in the management and treatment plan.  Hazle Nordmann, PA-C Orthopedics

## 2023-10-09 ENCOUNTER — Encounter (HOSPITAL_BASED_OUTPATIENT_CLINIC_OR_DEPARTMENT_OTHER): Payer: Self-pay

## 2023-10-09 ENCOUNTER — Telehealth (HOSPITAL_BASED_OUTPATIENT_CLINIC_OR_DEPARTMENT_OTHER): Payer: Self-pay | Admitting: Student

## 2023-10-09 NOTE — Telephone Encounter (Signed)
Patient said she needs a note saying that can not attend court date tomorrow due to surgery and court date needs to be resch. Please upload to [employer]

## 2023-10-09 NOTE — Telephone Encounter (Signed)
Note sent to mychart.

## 2023-10-10 ENCOUNTER — Ambulatory Visit (INDEPENDENT_AMBULATORY_CARE_PROVIDER_SITE_OTHER): Payer: 59 | Admitting: Student

## 2023-10-10 ENCOUNTER — Encounter (HOSPITAL_BASED_OUTPATIENT_CLINIC_OR_DEPARTMENT_OTHER): Payer: Self-pay | Admitting: Student

## 2023-10-10 DIAGNOSIS — S83242A Other tear of medial meniscus, current injury, left knee, initial encounter: Secondary | ICD-10-CM

## 2023-10-10 NOTE — Progress Notes (Signed)
Post Operative Evaluation    Procedure/Date of Surgery: Left knee arthroscopy with medial meniscus repair 09/17/2023  Interval History:   Patient is here today for evaluation of increased pain in her left knee.  She is now over 3 weeks status post left knee meniscal repair.  Has been unable to attend physical therapy since 12/11.  Today she reports that 2 days ago she began developing increased pain and swelling in her knee without any repeat injury.  Her knee felt hot and red although this has improved since then.  Rates pain today an 8/10.  Pain does also travel up toward the left hip.  Has been taking ibuprofen 800 mg.  Denies any fever, redness, chills, or fatigue.  PMH/PSH/Family History/Social History/Meds/Allergies:    Past Medical History:  Diagnosis Date   Adenomatous colon polyp 10/22/2006   Anemia    Anxiety 1999/2000   Arthritis    "jaw joints; neck" (07/28/2014)   Complication of anesthesia    "didn't wake up well/remembered stuff from during OR when I had pituitary surgery"   Constipation    Depression    hx   DVT (deep vein thrombosis) in pregnancy 09/21/2013   "LLE; postpartum"   Fibromyalgia    GERD (gastroesophageal reflux disease)    H/O hiatal hernia    Hematochezia    Hypoglycemia    IBS (irritable bowel syndrome)    Internal hemorrhoids    Migraines    "couple times/wk" (07/28/2014)   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 Stan Head   ESOPHAGOGASTRODUODENOSCOPY  09/29/2004   Dr. Karolee Ohs   KNEE ARTHROSCOPY WITH MENISCAL REPAIR Left 09/17/2023   Procedure: LEFT KNEE ARTHROSCOPY  WITH MEDIAL MENISCAL REPAIR;  Surgeon: Huel Cote, MD;  Location: [facility] OR;  Service: Orthopedics;  Laterality: Left;   LAPAROSCOPIC APPENDECTOMY N/A 07/28/2014   Procedure: APPENDECTOMY LAPAROSCOPIC;  Surgeon: Chevis Pretty III, MD;  Location: [facility] OR;  Service: General;  Laterality: N/A;   NASAL SINUS SURGERY     TEMPOROMANDIBULAR JOINT SURGERY     TRANSPHENOIDAL / TRANSNASAL HYPOPHYSECTOMY / RESECTION PITUITARY TUMOR  10/22/1997   resection of benign pituitary tumor, [facility]   UTERINE FIBROID SURGERY  10/22/2006   Social History   Socioeconomic History   Marital status: Unknown    Spouse name: Not on file   Number of children: 3   Years of education: College   Highest education level: Not on file  Occupational History   Occupation: Homemaker  Tobacco Use   Smoking status: Never   Smokeless tobacco: Never  Substance and Sexual Activity   Alcohol use: Yes    Alcohol/week: 1.0 standard drink of alcohol    Types: 1 Glasses of wine per week    Comment: Rarely - less than one drink every few months   Drug use: No   Sexual activity: Yes  Other Topics Concern   Not on file  Social History Narrative  Lives at home with her husband and three children.   Right-handed.   Rare caffeine use.   Social Drivers of Corporate investment banker Strain: Not on file  Food Insecurity: Not on file  Transportation Needs: Not on file  Physical Activity: Not on file  Stress: Not on file  Social Connections: Not on file   Family History  Problem Relation Age of Onset   Heart disease Mother    Diabetes Mother    Heart disease Father    Prostate cancer Father    Alzheimer's disease Father    Colon cancer Maternal Grandmother    Heart disease Other    Prostate cancer Other    Diabetes Other    Autism Child        2 of 3 daughters have high functioning Autism   Breast cancer Paternal Aunt    Allergies  Allergen Reactions   Latex Rash   Acetaminophen Other (See Comments)     Itchy, hives, rash, stomach pain, nausea   Influenza Vaccines    Metoprolol Other (See Comments)    dizziness   Venlafaxine     Reports one time seizure on this medication (occurred 20+ years ago)   Verapamil Nausea Only    Headache, dizziness   Current Outpatient Medications  Medication Sig Dispense Refill   amphetamine-dextroamphetamine (ADDERALL XR) 20 MG 24 hr capsule Take 20 mg by mouth daily as needed (adhd).     amphetamine-dextroamphetamine (ADDERALL) 20 MG tablet Take 20 mg by mouth 2 (two) times daily as needed (adhd).     aspirin EC 325 MG tablet Take 1 tablet (325 mg total) by mouth daily. 14 tablet 0   botulinum toxin Type A (BOTOX) 200 units injection Inject 155 units into the head and neck every 3 months. 1 each 0   butalbital-acetaminophen-caffeine (FIORICET) 50-325-40 MG tablet Take 1 tablet by mouth every 6 (six) hours as needed for headache. 10 tablet 5   Butalbital-APAP-Caffeine 50-300-40 MG CAPS TAKE 1 CAPSULE BY MOUTH DAILY AS NEEDED. DO NOT REFILL IN LESS THAN 30 DAYS (Patient not taking: Reported on 09/17/2023) 12 capsule 5   CANNABIDIOL PO Take 2 tablets by mouth daily as needed (pain relief).     cyclobenzaprine (FLEXERIL) 5 MG tablet Take 5 mg by mouth 3 (three) times daily as needed for muscle spasms.     Ferrous Sulfate (IRON PO) Take 1 tablet by mouth daily as needed (low iron).     fluticasone (FLONASE) 50 MCG/ACT nasal spray Place 2 sprays into both nostrils daily as needed for allergies.     lidocaine (HM LIDOCAINE PATCH) 4 % Place 1 patch onto the skin daily. 5% lidocaine patch (Patient not taking: Reported on 09/17/2023) 30 patch 1   oxyCODONE (ROXICODONE) 5 MG immediate release tablet Take 1 tablet (5 mg total) by mouth every 4 (four) hours as needed for severe pain (pain score 7-10) or breakthrough pain. 5 tablet 0   progesterone (PROMETRIUM) 100 MG capsule Take 100 mg by mouth daily as needed (hormone).     scopolamine (TRANSDERM-SCOP) 1 MG/3DAYS Place 1  patch (1.5 mg total) onto the skin every 3 (three) days. (Patient not taking: Reported on 09/17/2023) 10 patch 12   SUMAtriptan (IMITREX) 100 MG tablet May repeat in 2 hours if headache persists or recurs. 12 tablet 11   Current Facility-Administered Medications  Medication Dose Route Frequency Provider Last Rate Last Admin   botulinum toxin Type A (BOTOX) injection 155 Units  155 Units Intramuscular Once Levert Feinstein, MD       botulinum toxin Type A (BOTOX) injection 155 Units  155 Units Intramuscular Once Levert Feinstein, MD       botulinum toxin Type A (BOTOX) injection 155 Units  155 Units Intramuscular Once Levert Feinstein, MD       botulinum toxin Type A (BOTOX) injection 200 Units  200 Units Intramuscular Once Levert Feinstein, MD       botulinum toxin Type A (BOTOX) injection 200 Units  200 Units Intramuscular Once Levert Feinstein, MD       No results found.  Review of Systems:   A ROS was performed including pertinent positives and negatives as documented in the HPI.   Musculoskeletal Exam:    Last menstrual period 09/03/2023, unknown if currently breastfeeding.  Left knee incisions are well-appearing and intact without evidence of erythema or drainage.  Mild effusion and edema.  No evidence of overlying erythema or warmth.  Active range of motion from 5 to 90 degrees.  Pain with palpation along the lateral knee and proximally along the IT band.  Imaging:     Assessment:   [name] [name] is 3 weeks status post left knee medial meniscus repair she had been progressing extremely well although 2 days ago developed increased pain and swelling in the knee which has improved some in the last 24 hours.  No evidence on exam of the knee today that presents concern for infection.  Does not report any activity or injury that would raise concern for damage to the repair.  Her IT band does appear to be irritated and tender.  I would like for her to continue with physical therapy, which she has an appointment tomorrow.   Return precautions given on signs of infection.  Plan :    -Return to clinic in 4 weeks      I personally saw and evaluated the patient, and participated in the management and treatment plan.  Hazle Nordmann, PA-C Orthopedics

## 2023-10-11 ENCOUNTER — Ambulatory Visit (HOSPITAL_BASED_OUTPATIENT_CLINIC_OR_DEPARTMENT_OTHER): Payer: 59 | Admitting: Physical Therapy

## 2023-10-15 ENCOUNTER — Ambulatory Visit (HOSPITAL_BASED_OUTPATIENT_CLINIC_OR_DEPARTMENT_OTHER): Payer: 59 | Admitting: Physical Therapy

## 2023-10-15 ENCOUNTER — Encounter (HOSPITAL_BASED_OUTPATIENT_CLINIC_OR_DEPARTMENT_OTHER): Payer: Self-pay | Admitting: Physical Therapy

## 2023-10-15 DIAGNOSIS — M25562 Pain in left knee: Secondary | ICD-10-CM

## 2023-10-15 DIAGNOSIS — M6281 Muscle weakness (generalized): Secondary | ICD-10-CM | POA: Diagnosis not present
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
## 2023-10-18 ENCOUNTER — Ambulatory Visit (HOSPITAL_BASED_OUTPATIENT_CLINIC_OR_DEPARTMENT_OTHER): Payer: 59 | Admitting: Physical Therapy

## 2023-10-22 ENCOUNTER — Ambulatory Visit (HOSPITAL_BASED_OUTPATIENT_CLINIC_OR_DEPARTMENT_OTHER): Payer: 59 | Admitting: Physical Therapy

## 2023-10-25 ENCOUNTER — Ambulatory Visit (HOSPITAL_BASED_OUTPATIENT_CLINIC_OR_DEPARTMENT_OTHER): Payer: 59 | Attending: Orthopaedic Surgery | Admitting: Physical Therapy

## 2023-10-25 ENCOUNTER — Encounter (HOSPITAL_BASED_OUTPATIENT_CLINIC_OR_DEPARTMENT_OTHER): Payer: Self-pay | Admitting: Physical Therapy

## 2023-10-25 DIAGNOSIS — M6281 Muscle weakness (generalized): Secondary | ICD-10-CM | POA: Insufficient documentation

## 2023-10-25 DIAGNOSIS — M545 Low back pain, unspecified: Secondary | ICD-10-CM | POA: Diagnosis present

## 2023-10-25 DIAGNOSIS — G8929 Other chronic pain: Secondary | ICD-10-CM | POA: Diagnosis present

## 2023-10-25 DIAGNOSIS — R2689 Other abnormalities of gait and mobility: Secondary | ICD-10-CM | POA: Insufficient documentation

## 2023-10-25 DIAGNOSIS — M25562 Pain in left knee: Secondary | ICD-10-CM | POA: Insufficient documentation
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
## 2023-10-29 ENCOUNTER — Ambulatory Visit (HOSPITAL_BASED_OUTPATIENT_CLINIC_OR_DEPARTMENT_OTHER): Payer: 59 | Admitting: Physical Therapy

## 2023-10-29 ENCOUNTER — Encounter (HOSPITAL_BASED_OUTPATIENT_CLINIC_OR_DEPARTMENT_OTHER): Payer: Self-pay | Admitting: Physical Therapy

## 2023-10-29 DIAGNOSIS — M6281 Muscle weakness (generalized): Secondary | ICD-10-CM | POA: Diagnosis not present

## 2023-10-29 DIAGNOSIS — M25562 Pain in left knee: Secondary | ICD-10-CM

## 2023-10-29 DIAGNOSIS — R2689 Other abnormalities of gait and mobility: Secondary | ICD-10-CM

## 2023-10-29 DIAGNOSIS — G8929 Other chronic pain: Secondary | ICD-10-CM
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
## 2023-11-05 ENCOUNTER — Encounter (HOSPITAL_BASED_OUTPATIENT_CLINIC_OR_DEPARTMENT_OTHER): Payer: Self-pay | Admitting: Physical Therapy

## 2023-11-05 ENCOUNTER — Ambulatory Visit (HOSPITAL_BASED_OUTPATIENT_CLINIC_OR_DEPARTMENT_OTHER): Payer: 59 | Admitting: Physical Therapy

## 2023-11-05 DIAGNOSIS — M6281 Muscle weakness (generalized): Secondary | ICD-10-CM | POA: Diagnosis not present

## 2023-11-05 DIAGNOSIS — R2689 Other abnormalities of gait and mobility: Secondary | ICD-10-CM

## 2023-11-05 DIAGNOSIS — G8929 Other chronic pain: Secondary | ICD-10-CM

## 2023-11-05 DIAGNOSIS — M25562 Pain in left knee: Secondary | ICD-10-CM
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
## 2023-11-06 ENCOUNTER — Ambulatory Visit (HOSPITAL_BASED_OUTPATIENT_CLINIC_OR_DEPARTMENT_OTHER): Payer: 59 | Admitting: Orthopaedic Surgery

## 2023-11-06 DIAGNOSIS — S83242A Other tear of medial meniscus, current injury, left knee, initial encounter: Secondary | ICD-10-CM

## 2023-11-06 NOTE — Progress Notes (Signed)
 Chief Complaint: Left knee medial meniscal injury status post 11/26      History of Present Illness:   11/06/2023: Presents today 3 weeks status post left knee medial meniscal repair.  She is still having some clicking and popping about the IT band but overall her pain is much better in the knee.   Surgical History:   None  PMH/PSH/Family History/Social History/Meds/Allergies:    Past Medical History:  Diagnosis Date   Adenomatous colon polyp 10/22/2006   Anemia    Anxiety 1999/2000   Arthritis    "jaw joints; neck" (07/28/2014)   Complication of anesthesia    "didn't wake up well/remembered stuff from during OR when I had pituitary surgery"   Constipation    Depression    hx   DVT (deep vein thrombosis) in pregnancy 09/21/2013   "LLE; postpartum"   Fibromyalgia    GERD (gastroesophageal reflux disease)    H/O hiatal hernia    Hematochezia    Hypoglycemia    IBS (irritable bowel syndrome)    Internal hemorrhoids    Migraines    "couple times/wk" (07/28/2014)   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 Loy Ruff   ESOPHAGOGASTRODUODENOSCOPY  09/29/2004   Dr. Wayna Hails   KNEE ARTHROSCOPY WITH MENISCAL REPAIR Left 09/17/2023   Procedure: LEFT KNEE ARTHROSCOPY WITH MEDIAL MENISCAL REPAIR;  Surgeon: Wilhelmenia Harada, MD;  Location: [facility] OR;  Service: Orthopedics;  Laterality: Left;   LAPAROSCOPIC APPENDECTOMY N/A 07/28/2014   Procedure: APPENDECTOMY LAPAROSCOPIC;  Surgeon: Lillette Reid III, MD;  Location: [facility] OR;  Service: General;  Laterality: N/A;   NASAL SINUS SURGERY     TEMPOROMANDIBULAR JOINT SURGERY      TRANSPHENOIDAL / TRANSNASAL HYPOPHYSECTOMY / RESECTION PITUITARY TUMOR  10/22/1997   resection of benign pituitary tumor, [facility]   UTERINE FIBROID SURGERY  10/22/2006   Social History   Socioeconomic History   Marital status: Unknown    Spouse name: Not on file   Number of children: 3   Years of education: College   Highest education level: Not on file  Occupational History   Occupation: Homemaker  Tobacco Use   Smoking status: Never   Smokeless tobacco: Never  Substance and Sexual Activity   Alcohol  use: Yes    Alcohol /week: 1.0 standard drink of alcohol     Types: 1 Glasses of wine per week    Comment: Rarely - less than one drink every few months   Drug use: No   Sexual activity: Yes  Other Topics Concern   Not on file  Social History Narrative   Lives at home with her husband and three children.   Right-handed.   Rare caffeine  use.   Social Drivers of Corporate investment banker Strain: Not on file  Food Insecurity: Not on file  Transportation Needs: Not on file  Physical Activity: Not on file  Stress: Not on file  Social Connections: Not on file  Family History  Problem Relation Age of Onset   Heart disease Mother    Diabetes Mother    Heart disease Father    Prostate cancer Father    Alzheimer's disease Father    Colon cancer Maternal Grandmother    Heart disease Other    Prostate cancer Other    Diabetes Other    Autism Child        2 of 3 daughters have high functioning Autism   Breast cancer Paternal Aunt    Allergies  Allergen Reactions   Latex Rash   Acetaminophen  Other (See Comments)    Itchy, hives, rash, stomach pain, nausea   Influenza Vaccines    Metoprolol  Other (See Comments)    dizziness   Venlafaxine      Reports one time seizure on this medication (occurred 20+ years ago)   Verapamil  Nausea Only    Headache, dizziness   Current Outpatient Medications  Medication Sig Dispense Refill   amphetamine -dextroamphetamine   (ADDERALL XR) 20 MG 24 hr capsule Take 20 mg by mouth daily as needed (adhd).     amphetamine -dextroamphetamine  (ADDERALL) 20 MG tablet Take 20 mg by mouth 2 (two) times daily as needed (adhd).     aspirin  EC 325 MG tablet Take 1 tablet (325 mg total) by mouth daily. 14 tablet 0   botulinum toxin Type A  (BOTOX ) 200 units injection Inject 155 units into the head and neck every 3 months. 1 each 0   butalbital -acetaminophen -caffeine  (FIORICET ) 50-325-40 MG tablet Take 1 tablet by mouth every 6 (six) hours as needed for headache. 10 tablet 5   Butalbital -APAP-Caffeine  50-300-40 MG CAPS TAKE 1 CAPSULE BY MOUTH DAILY AS NEEDED. DO NOT REFILL IN LESS THAN 30 DAYS (Patient not taking: Reported on 09/17/2023) 12 capsule 5   CANNABIDIOL PO Take 2 tablets by mouth daily as needed (pain relief).     cyclobenzaprine  (FLEXERIL ) 5 MG tablet Take 5 mg by mouth 3 (three) times daily as needed for muscle spasms.     Ferrous Sulfate (IRON PO) Take 1 tablet by mouth daily as needed (low iron).     fluticasone (FLONASE) 50 MCG/ACT nasal spray Place 2 sprays into both nostrils daily as needed for allergies.     lidocaine  (HM LIDOCAINE  PATCH) 4 % Place 1 patch onto the skin daily. 5% lidocaine  patch (Patient not taking: Reported on 09/17/2023) 30 patch 1   oxyCODONE  (ROXICODONE ) 5 MG immediate release tablet Take 1 tablet (5 mg total) by mouth every 4 (four) hours as needed for severe pain (pain score 7-10) or breakthrough pain. 5 tablet 0   progesterone  (PROMETRIUM ) 100 MG capsule Take 100 mg by mouth daily as needed (hormone).     scopolamine  (TRANSDERM-SCOP) 1 MG/3DAYS Place 1 patch (1.5 mg total) onto the skin every 3 (three) days. (Patient not taking: Reported on 09/17/2023) 10 patch 12   SUMAtriptan  (IMITREX ) 100 MG tablet May repeat in 2 hours if headache persists or recurs. 12 tablet 11   Current Facility-Administered Medications  Medication Dose Route Frequency Provider Last Rate Last Admin   botulinum toxin  Type A (BOTOX ) injection 155 Units  155 Units Intramuscular Once Phebe Brasil, MD       botulinum toxin Type A  (BOTOX ) injection 155 Units  155 Units Intramuscular Once Phebe Brasil, MD       botulinum toxin Type A  (BOTOX ) injection 155 Units  155 Units Intramuscular Once Yan, Yijun, MD       botulinum toxin Type A  (BOTOX )  injection 200 Units  200 Units Intramuscular Once Phebe Brasil, MD       botulinum toxin Type A  (BOTOX ) injection 200 Units  200 Units Intramuscular Once Phebe Brasil, MD       No results found.  Review of Systems:   A ROS was performed including pertinent positives and negatives as documented in the HPI.  Physical Exam :   Constitutional: NAD and appears stated age Neurological: Alert and oriented Psych: Appropriate affect and cooperative unknown if currently breastfeeding.   Comprehensive Musculoskeletal Exam:    Incisions are well-appearing without erythema or drainage.  No joint line tenderness.  Range of motion is from 0 to 120 degrees.  Imaging:       I personally reviewed and interpreted the radiographs.   Assessment:   48 y.o. female 7 weeks status post left knee medial meniscal overall doing very well.  She is continuing to improve.  At this time she has no joint line tenderness.  I would like her to work on her IT band with rehab.  Specifically I believe she would be a candidate for dry needling and massage with this.  I will plan to see her back as needed  Plan :    -Return to clinic as needed     I personally saw and evaluated the patient, and participated in the management and treatment plan.  Wilhelmenia Harada, MD Attending Physician, Orthopedic Surgery  This document was dictated using Dragon voice recognition software. A reasonable attempt at proof reading has been made to minimize errors.

## 2023-11-12 ENCOUNTER — Ambulatory Visit (HOSPITAL_BASED_OUTPATIENT_CLINIC_OR_DEPARTMENT_OTHER): Payer: 59 | Admitting: Physical Therapy

## 2023-11-14 ENCOUNTER — Ambulatory Visit (HOSPITAL_BASED_OUTPATIENT_CLINIC_OR_DEPARTMENT_OTHER): Payer: 59 | Admitting: Physical Therapy

## 2023-11-14 ENCOUNTER — Encounter (HOSPITAL_BASED_OUTPATIENT_CLINIC_OR_DEPARTMENT_OTHER): Payer: Self-pay | Admitting: Physical Therapy

## 2023-11-14 DIAGNOSIS — M6281 Muscle weakness (generalized): Secondary | ICD-10-CM

## 2023-11-14 DIAGNOSIS — R2689 Other abnormalities of gait and mobility: Secondary | ICD-10-CM

## 2023-11-14 DIAGNOSIS — M25562 Pain in left knee: Secondary | ICD-10-CM

## 2023-11-14 DIAGNOSIS — G8929 Other chronic pain: Secondary | ICD-10-CM
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
## 2023-11-21 ENCOUNTER — Ambulatory Visit (HOSPITAL_BASED_OUTPATIENT_CLINIC_OR_DEPARTMENT_OTHER): Payer: 59 | Admitting: Physical Therapy

## 2023-11-21 ENCOUNTER — Encounter (HOSPITAL_BASED_OUTPATIENT_CLINIC_OR_DEPARTMENT_OTHER): Payer: Self-pay | Admitting: Physical Therapy

## 2023-11-21 ENCOUNTER — Other Ambulatory Visit (HOSPITAL_BASED_OUTPATIENT_CLINIC_OR_DEPARTMENT_OTHER): Payer: Self-pay | Admitting: Orthopaedic Surgery

## 2023-11-21 DIAGNOSIS — R2689 Other abnormalities of gait and mobility: Secondary | ICD-10-CM

## 2023-11-21 DIAGNOSIS — M25562 Pain in left knee: Secondary | ICD-10-CM

## 2023-11-21 DIAGNOSIS — M6281 Muscle weakness (generalized): Secondary | ICD-10-CM | POA: Diagnosis not present

## 2023-11-21 DIAGNOSIS — S83242A Other tear of medial meniscus, current injury, left knee, initial encounter: Secondary | ICD-10-CM

## 2023-11-21 DIAGNOSIS — G8929 Other chronic pain: Secondary | ICD-10-CM

## 2023-11-21 DIAGNOSIS — M545 Low back pain, unspecified: Secondary | ICD-10-CM
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
## 2023-11-27 ENCOUNTER — Ambulatory Visit (HOSPITAL_BASED_OUTPATIENT_CLINIC_OR_DEPARTMENT_OTHER): Payer: 59 | Admitting: Physical Therapy

## 2023-11-29 ENCOUNTER — Encounter (HOSPITAL_BASED_OUTPATIENT_CLINIC_OR_DEPARTMENT_OTHER): Payer: Self-pay | Admitting: Physical Therapy

## 2023-11-29 ENCOUNTER — Ambulatory Visit (HOSPITAL_BASED_OUTPATIENT_CLINIC_OR_DEPARTMENT_OTHER): Payer: 59 | Attending: Orthopaedic Surgery | Admitting: Physical Therapy

## 2023-11-29 DIAGNOSIS — G8929 Other chronic pain: Secondary | ICD-10-CM | POA: Diagnosis present

## 2023-11-29 DIAGNOSIS — M6281 Muscle weakness (generalized): Secondary | ICD-10-CM | POA: Diagnosis present

## 2023-11-29 DIAGNOSIS — M25562 Pain in left knee: Secondary | ICD-10-CM | POA: Insufficient documentation

## 2023-11-29 DIAGNOSIS — S83242A Other tear of medial meniscus, current injury, left knee, initial encounter: Secondary | ICD-10-CM | POA: Diagnosis present

## 2023-11-29 DIAGNOSIS — R2689 Other abnormalities of gait and mobility: Secondary | ICD-10-CM | POA: Insufficient documentation
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
## 2023-12-04 ENCOUNTER — Encounter (HOSPITAL_BASED_OUTPATIENT_CLINIC_OR_DEPARTMENT_OTHER): Payer: Self-pay | Admitting: Physical Therapy

## 2023-12-04 ENCOUNTER — Ambulatory Visit (HOSPITAL_BASED_OUTPATIENT_CLINIC_OR_DEPARTMENT_OTHER): Payer: 59 | Admitting: Physical Therapy

## 2023-12-04 DIAGNOSIS — G8929 Other chronic pain: Secondary | ICD-10-CM

## 2023-12-04 DIAGNOSIS — M6281 Muscle weakness (generalized): Secondary | ICD-10-CM | POA: Diagnosis not present

## 2023-12-04 DIAGNOSIS — M25562 Pain in left knee: Secondary | ICD-10-CM

## 2023-12-10 ENCOUNTER — Telehealth: Payer: Self-pay | Admitting: Neurology

## 2023-12-10 NOTE — Telephone Encounter (Signed)
LVM and sent mychart msg asking pt to cb and r/s- office closing.

## 2023-12-11 ENCOUNTER — Ambulatory Visit: Payer: 59 | Admitting: Neurology

## 2023-12-12 ENCOUNTER — Ambulatory Visit (HOSPITAL_BASED_OUTPATIENT_CLINIC_OR_DEPARTMENT_OTHER): Payer: 59 | Admitting: Physical Therapy

## 2023-12-12 ENCOUNTER — Encounter (HOSPITAL_BASED_OUTPATIENT_CLINIC_OR_DEPARTMENT_OTHER): Payer: Self-pay | Admitting: Physical Therapy

## 2023-12-12 DIAGNOSIS — M6281 Muscle weakness (generalized): Secondary | ICD-10-CM | POA: Diagnosis not present

## 2023-12-12 DIAGNOSIS — M25562 Pain in left knee: Secondary | ICD-10-CM

## 2023-12-12 DIAGNOSIS — G8929 Other chronic pain: Secondary | ICD-10-CM
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
## 2023-12-18 ENCOUNTER — Ambulatory Visit (INDEPENDENT_AMBULATORY_CARE_PROVIDER_SITE_OTHER): Payer: 59 | Admitting: Neurology

## 2023-12-18 ENCOUNTER — Ambulatory Visit (HOSPITAL_BASED_OUTPATIENT_CLINIC_OR_DEPARTMENT_OTHER): Payer: 59 | Admitting: Physical Therapy

## 2023-12-18 ENCOUNTER — Encounter: Payer: Self-pay | Admitting: Neurology

## 2023-12-18 ENCOUNTER — Encounter (HOSPITAL_BASED_OUTPATIENT_CLINIC_OR_DEPARTMENT_OTHER): Payer: Self-pay | Admitting: Physical Therapy

## 2023-12-18 VITALS — BP 120/79 | HR 95 | Ht 66.0 in | Wt 147.5 lb

## 2023-12-18 DIAGNOSIS — M6281 Muscle weakness (generalized): Secondary | ICD-10-CM

## 2023-12-18 DIAGNOSIS — G8929 Other chronic pain: Secondary | ICD-10-CM

## 2023-12-18 DIAGNOSIS — M25562 Pain in left knee: Secondary | ICD-10-CM

## 2023-12-18 DIAGNOSIS — G43709 Chronic migraine without aura, not intractable, without status migrainosus: Secondary | ICD-10-CM | POA: Diagnosis not present

## 2023-12-18 MED ORDER — ONABOTULINUMTOXINA 200 UNITS IJ SOLR
155.0000 [IU] | Freq: Once | INTRAMUSCULAR | Status: AC
  Administered 2023-12-18: 155 [IU] via INTRAMUSCULAR
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
## 2023-12-18 NOTE — Progress Notes (Signed)
   She reported moderate improvement with Botox injection as migraine prevention.  No longer use Maxalt, use Imitrex as needed, along with Zofran.  Also complains of worsening depression, anxiety, failed multiple treatment in the past, is seeing psychotherapy,   Botox injection for chronic migraine prevention, injection was performed according to [place] protocol,  5 units of Botox was injected into each side, for 31 injection sites, total of 155 units  Bilateral frontalis 4 injection sites Bilateral corrugate 2 injection sites Procerus 1 injection sites. Bilateral temporalis 8 injection sites Bilateral occipitalis 6 injection sites Bilateral cervical paraspinals 4 injection sites Bilateral upper trapezius 6 injection sites  Extra 45 unites were injected into bilateral levator scapular and cervical paraspinal and masseter muscles  She tolerated injection well will return to clinic in 3 months for repeat injection

## 2023-12-18 NOTE — Progress Notes (Signed)
 Botox- 200 units x 1 vial Lot: [id number] Expiration: 01/2026 NDC: 9563-8756-43  Bacteriostatic 0.9% Sodium Chloride- * mL  Lot: [id number] Expiration: 08/22/2024 NDC: 8416-6063-01  Dx: S01.093 B/B Witnessed by April Delgreco, RN

## 2023-12-27 ENCOUNTER — Telehealth (HOSPITAL_BASED_OUTPATIENT_CLINIC_OR_DEPARTMENT_OTHER): Payer: Self-pay | Admitting: Physical Therapy

## 2023-12-27 ENCOUNTER — Ambulatory Visit (HOSPITAL_BASED_OUTPATIENT_CLINIC_OR_DEPARTMENT_OTHER): Attending: Orthopaedic Surgery | Admitting: Physical Therapy

## 2023-12-27 DIAGNOSIS — M25562 Pain in left knee: Secondary | ICD-10-CM | POA: Insufficient documentation

## 2023-12-27 DIAGNOSIS — G8929 Other chronic pain: Secondary | ICD-10-CM | POA: Insufficient documentation

## 2023-12-27 DIAGNOSIS — M6281 Muscle weakness (generalized): Secondary | ICD-10-CM | POA: Insufficient documentation

## 2023-12-27 NOTE — Telephone Encounter (Signed)
 Patient did not show for aquatic PT appointment. Called and spoke to patient.  Patient stated she is sick and forgot to cancel appointment through portal.  Confirmed next upcoming appointment with her.   Mayer Camel, PTA 12/27/23 10:09 AM [facility] [address] [place], [place], [address] Phone: [phone]   Fax:  [phone]

## 2023-12-30 ENCOUNTER — Ambulatory Visit (HOSPITAL_BASED_OUTPATIENT_CLINIC_OR_DEPARTMENT_OTHER): Admitting: Physical Therapy

## 2023-12-30 ENCOUNTER — Encounter (HOSPITAL_BASED_OUTPATIENT_CLINIC_OR_DEPARTMENT_OTHER): Payer: Self-pay | Admitting: Physical Therapy

## 2023-12-30 DIAGNOSIS — G8929 Other chronic pain: Secondary | ICD-10-CM | POA: Diagnosis present

## 2023-12-30 DIAGNOSIS — M25562 Pain in left knee: Secondary | ICD-10-CM

## 2023-12-30 DIAGNOSIS — M6281 Muscle weakness (generalized): Secondary | ICD-10-CM | POA: Diagnosis present
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
## 2023-12-31 ENCOUNTER — Encounter (HOSPITAL_BASED_OUTPATIENT_CLINIC_OR_DEPARTMENT_OTHER): Payer: 59 | Admitting: Physical Therapy

## 2024-01-02 ENCOUNTER — Encounter (HOSPITAL_BASED_OUTPATIENT_CLINIC_OR_DEPARTMENT_OTHER): Payer: Self-pay | Admitting: Physical Therapy

## 2024-01-02 ENCOUNTER — Ambulatory Visit (HOSPITAL_BASED_OUTPATIENT_CLINIC_OR_DEPARTMENT_OTHER): Payer: 59 | Admitting: Physical Therapy

## 2024-01-02 DIAGNOSIS — M6281 Muscle weakness (generalized): Secondary | ICD-10-CM | POA: Diagnosis not present

## 2024-01-02 DIAGNOSIS — M25562 Pain in left knee: Secondary | ICD-10-CM

## 2024-01-02 DIAGNOSIS — G8929 Other chronic pain: Secondary | ICD-10-CM
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
## 2024-01-07 ENCOUNTER — Encounter (HOSPITAL_BASED_OUTPATIENT_CLINIC_OR_DEPARTMENT_OTHER): Payer: Self-pay | Admitting: Physical Therapy

## 2024-01-07 ENCOUNTER — Ambulatory Visit (HOSPITAL_BASED_OUTPATIENT_CLINIC_OR_DEPARTMENT_OTHER): Payer: 59 | Admitting: Physical Therapy

## 2024-01-07 DIAGNOSIS — M6281 Muscle weakness (generalized): Secondary | ICD-10-CM | POA: Diagnosis not present
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
## 2024-01-09 ENCOUNTER — Encounter (HOSPITAL_BASED_OUTPATIENT_CLINIC_OR_DEPARTMENT_OTHER): Payer: Self-pay | Admitting: Physical Therapy

## 2024-01-09 ENCOUNTER — Ambulatory Visit (HOSPITAL_BASED_OUTPATIENT_CLINIC_OR_DEPARTMENT_OTHER): Payer: 59 | Admitting: Physical Therapy

## 2024-01-09 DIAGNOSIS — M6281 Muscle weakness (generalized): Secondary | ICD-10-CM
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
## 2024-01-14 ENCOUNTER — Ambulatory Visit (HOSPITAL_BASED_OUTPATIENT_CLINIC_OR_DEPARTMENT_OTHER): Payer: 59 | Admitting: Physical Therapy

## 2024-01-14 ENCOUNTER — Encounter (HOSPITAL_BASED_OUTPATIENT_CLINIC_OR_DEPARTMENT_OTHER): Payer: Self-pay | Admitting: Physical Therapy

## 2024-01-14 DIAGNOSIS — M6281 Muscle weakness (generalized): Secondary | ICD-10-CM | POA: Diagnosis not present
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
## 2024-03-11 ENCOUNTER — Ambulatory Visit: Payer: 59 | Admitting: Neurology

## 2024-03-11 ENCOUNTER — Encounter: Payer: Self-pay | Admitting: Neurology

## 2024-04-04 ENCOUNTER — Other Ambulatory Visit: Payer: Self-pay

## 2024-04-04 ENCOUNTER — Emergency Department (HOSPITAL_BASED_OUTPATIENT_CLINIC_OR_DEPARTMENT_OTHER)
Admission: EM | Admit: 2024-04-04 | Discharge: 2024-04-04 | Disposition: A | Discharge status: Home / Self Care | Attending: Emergency Medicine | Admitting: Emergency Medicine

## 2024-04-04 ENCOUNTER — Encounter (HOSPITAL_BASED_OUTPATIENT_CLINIC_OR_DEPARTMENT_OTHER): Payer: Self-pay

## 2024-04-04 DIAGNOSIS — Z7982 Long term (current) use of aspirin: Secondary | ICD-10-CM | POA: Diagnosis not present

## 2024-04-04 DIAGNOSIS — Z23 Encounter for immunization: Secondary | ICD-10-CM | POA: Diagnosis not present

## 2024-04-04 DIAGNOSIS — Z91040 Latex allergy status: Secondary | ICD-10-CM | POA: Insufficient documentation

## 2024-04-04 DIAGNOSIS — W25XXXA Contact with sharp glass, initial encounter: Secondary | ICD-10-CM | POA: Insufficient documentation

## 2024-04-04 DIAGNOSIS — S41111A Laceration without foreign body of right upper arm, initial encounter: Secondary | ICD-10-CM | POA: Insufficient documentation

## 2024-04-04 DIAGNOSIS — S59911A Unspecified injury of right forearm, initial encounter: Secondary | ICD-10-CM | POA: Diagnosis present

## 2024-04-04 MED ORDER — LIDOCAINE-EPINEPHRINE (PF) 2 %-1:200000 IJ SOLN
10.0000 mL | Freq: Once | INTRAMUSCULAR | Status: AC
  Administered 2024-04-04: 10 mL via INTRADERMAL
  Filled 2024-04-04: qty 20

## 2024-04-04 MED ORDER — TETANUS-DIPHTH-ACELL PERTUSSIS 5-2.5-18.5 LF-MCG/0.5 IM SUSY
0.5000 mL | PREFILLED_SYRINGE | Freq: Once | INTRAMUSCULAR | Status: AC
  Administered 2024-04-04: 0.5 mL via INTRAMUSCULAR
  Filled 2024-04-04: qty 0.5

## 2024-04-04 NOTE — Discharge Instructions (Signed)
 You were seen for your cut (laceration) in the emergency department.   At home, please keep the area completely dry for 24 hours.  After that you may wash with soap and water but do not submerge until your stitches are removed or they fall out.  Use Neosporin or topical ointment with a dressing to keep it covered.  Scars are unavoidable but they can be less noticeable if you prevent sun exposure by covering with clothing or wearing sunscreen over the area for the next 6 months.   Follow-up to have your stitches removed at your primary doctor's office, urgent care, or emergency department in several days (see below for timeline). Face: 3-5 days Scalp, arms: 7-10 days Legs, hands, feet, and torso: 10-14 days   Return immediately to the emergency department if you experience any of the following: Drainage from your wound, redness around your wound, fevers, or any other concerning symptoms.    Thank you for visiting our Emergency Department. It was a pleasure taking care of you today.

## 2024-04-04 NOTE — ED Notes (Signed)
 Dressed right forearm with non-stick pad, bactrim ointment and gauze. Pt tolerated well.

## 2024-04-04 NOTE — ED Notes (Signed)
Reviewed discharge instructions and home care with pt. Pt verbalized understanding and had no further questions. Pt exited ED without complications.

## 2024-04-04 NOTE — ED Triage Notes (Signed)
 Pt presents w lac to R forearm after cleaning broken window. Happened approx 3hrs ago, dressing changed in triage.   Pt refusing xray in triage Tdap 2014

## 2024-04-04 NOTE — ED Provider Notes (Signed)
  EMERGENCY DEPARTMENT AT [facility] Provider Note   CSN: [id number] Arrival date & time: 04/04/24  2003     Patient presents with: Laceration   JENELL DOBRANSKY is a 48 y.o. female.   48 year old female who presents emergency department with hand laceration.  Patient reports that she was cleaning up some broken glass when she cut her right forearm.  Says that there is significant amount of bleeding that she was unable to control so decided to come into the emergency department.  Not on blood thinners. Last tdap 2014.        Prior to Admission medications   Medication Sig Start Date End Date Taking? Authorizing Provider  amphetamine -dextroamphetamine  (ADDERALL XR) 20 MG 24 hr capsule Take 20 mg by mouth daily as needed (adhd). Patient not taking: Reported on 12/18/2023    [provider]  amphetamine -dextroamphetamine  (ADDERALL) 20 MG tablet Take 20 mg by mouth 2 (two) times daily as needed (adhd). 05/25/22   [provider]  aspirin  EC 325 MG tablet Take 1 tablet (325 mg total) by mouth daily. Patient not taking: Reported on 12/18/2023 09/17/23   Wilhelmenia Harada, MD  botulinum toxin Type A  (BOTOX ) 200 units injection Inject 155 units into the head and neck every 3 months. 02/27/23   Phebe Brasil, MD  butalbital -acetaminophen -caffeine  (FIORICET ) 50-325-40 MG tablet Take 1 tablet by mouth every 6 (six) hours as needed for headache. 09/26/23   Phebe Brasil, MD  Butalbital -APAP-Caffeine  50-300-40 MG CAPS TAKE 1 CAPSULE BY MOUTH DAILY AS NEEDED. DO NOT REFILL IN LESS THAN 30 DAYS 09/17/23   Cassandra Cleveland, MD  CANNABIDIOL PO Take 2 tablets by mouth daily as needed (pain relief).    [provider]  cyclobenzaprine  (FLEXERIL ) 5 MG tablet Take 5 mg by mouth 3 (three) times daily as needed for muscle spasms. 08/31/23   [provider]  Ferrous Sulfate (IRON PO) Take 1 tablet by mouth daily as needed (low iron).    [provider]   fluticasone (FLONASE) 50 MCG/ACT nasal spray Place 2 sprays into both nostrils daily as needed for allergies.    [provider]  lidocaine  (HM LIDOCAINE  PATCH) 4 % Place 1 patch onto the skin daily. 5% lidocaine  patch 02/20/23   Corey, Evan S, MD  oxyCODONE  (ROXICODONE ) 5 MG immediate release tablet Take 1 tablet (5 mg total) by mouth every 4 (four) hours as needed for severe pain (pain score 7-10) or breakthrough pain. 09/17/23   Wilhelmenia Harada, MD  progesterone  (PROMETRIUM ) 100 MG capsule Take 100 mg by mouth daily as needed (hormone).    [provider]  scopolamine  (TRANSDERM-SCOP) 1 MG/3DAYS Place 1 patch (1.5 mg total) onto the skin every 3 (three) days. 10/24/22   Syliva Even, MD  SUMAtriptan  (IMITREX ) 100 MG tablet May repeat in 2 hours if headache persists or recurs. 06/05/23   Phebe Brasil, MD    Allergies: Latex, Acetaminophen , Influenza vaccines, Metoprolol , Venlafaxine , and Verapamil     Review of Systems  Updated Vital Signs BP 117/77   Pulse 76   Resp 18   SpO2 98%   Physical Exam  Musculoskeletal:     Comments: 3 cm laceration to mid forearm on the right side with bleeding controlled.  No foreign body noted.  Full range of motion of wrist and all fingers in the hand.  Intact sensation to light touch in median, radial, and ulnar distribution.  Radial and ulnar pulses 2+ bilaterally.  Hand appears warm and  well-perfused otherwise.     (all labs ordered are listed, but only abnormal results are displayed) Labs Reviewed - No data to display  EKG: None  Radiology: No results found.   .Laceration Repair  Date/Time: 04/04/2024 9:45 PM  Performed by: Ninetta Basket, MD Authorized by: Ninetta Basket, MD   Consent:    Consent obtained:  Verbal   Consent given by:  Patient   Risks discussed:  Infection, pain, retained foreign body and poor cosmetic result Anesthesia:    Anesthesia method:  Local infiltration   Local anesthetic:  Lidocaine   2% WITH epi Laceration details:    Location:  Shoulder/arm   Shoulder/arm location:  R lower arm   Length (cm):  3 Treatment:    Irrigation solution:  Sterile water   Irrigation method:  Pressure wash Skin repair:    Repair method:  Sutures   Suture size:  5-0   Suture material:  Prolene   Suture technique:  Simple interrupted   Number of sutures:  3 Approximation:    Approximation:  Close Repair type:    Repair type:  Simple Post-procedure details:    Dressing:  Non-adherent dressing   Procedure completion:  Tolerated well, no immediate complications    Medications Ordered in the ED  lidocaine -EPINEPHrine  (XYLOCAINE  W/EPI) 2 %-1:200000 (PF) injection 10 mL (10 mLs Intradermal Given 04/04/24 2028)  Tdap (BOOSTRIX ) injection 0.5 mL (0.5 mLs Intramuscular Given 04/04/24 2029)    Clinical Course as of 04/04/24 2148  Sat Apr 04, 2024  2014 Patient declined x-ray. [RP]    Clinical Course User Index [RP] Ninetta Basket, MD                                 Medical Decision Making Risk Prescription drug management.   48 year old female who presents emergency department with a laceration on her right lower arm  Initial Ddx:  Laceration, tendon injury, nerve injury, retained foreign body  MDM/Course:  Patient presents emergency department with laceration to right forearm sustained by some broken glass.  Initially was concerned about potentially retained foreign body but patient declined x-ray.  Did explain that this could lead to retained foreign body and persistent infection and pain but stated that she did not want it since she thinks that a piece of glass is not currently lodged in her arm.  Did explore the wound through full range of motion and depth and do not see a foreign body.  It was repaired with 3 nonabsorbable sutures which will need to be removed in 7 to 10 days.  Tetanus updated.  Patient discharged home after giving instructions on how to care for her  wound.  This patient presents to the ED for concern of complaints listed in HPI, this involves an extensive number of treatment options, and is a complaint that carries with it a high risk of complications and morbidity. Disposition including potential need for admission considered.   Dispo: DC Home. Return precautions discussed including, but not limited to, those listed in the AVS. Allowed pt time to ask questions which were answered fully prior to dc.  Records reviewed Outpatient Clinic Notes I have reviewed the patients home medications and made adjustments as needed  Portions of this note were generated with Dragon dictation software. Dictation errors may occur despite best attempts at proofreading.     Final diagnoses:  Laceration of right upper extremity, initial encounter  ED Discharge Orders     None          Ninetta Basket, MD 04/04/24 2148

## 2024-04-08 ENCOUNTER — Ambulatory Visit (INDEPENDENT_AMBULATORY_CARE_PROVIDER_SITE_OTHER): Admitting: Neurology

## 2024-04-08 VITALS — BP 125/77

## 2024-04-08 DIAGNOSIS — G43709 Chronic migraine without aura, not intractable, without status migrainosus: Secondary | ICD-10-CM

## 2024-04-08 DIAGNOSIS — G44209 Tension-type headache, unspecified, not intractable: Secondary | ICD-10-CM

## 2024-04-08 DIAGNOSIS — G43711 Chronic migraine without aura, intractable, with status migrainosus: Secondary | ICD-10-CM

## 2024-04-08 MED ORDER — SUMATRIPTAN SUCCINATE 100 MG PO TABS: ORAL_TABLET | ORAL | 11 refills | Status: DC

## 2024-04-08 MED ORDER — ONABOTULINUMTOXINA 200 UNITS IJ SOLR
200.0000 [IU] | Freq: Once | INTRAMUSCULAR | Status: AC
  Administered 2024-04-08: 200 [IU] via INTRAMUSCULAR

## 2024-04-08 MED ORDER — AIMOVIG 70 MG/ML ~~LOC~~ SOAJ: 70.0000 mg | SUBCUTANEOUS | 11 refills | Status: DC

## 2024-04-08 NOTE — Progress Notes (Signed)
 Botox - 200 units x 1 vial Lot: [id number] Expiration: 2027/11 NDC: 0023-3921-02  Bacteriostatic 0.9% Sodium Chloride - 4 mL  Lot: [id number] Expiration: 04/20/25 NDC: 3244010272  Dx: Z36.644  B/B Witnessed by Shona Downs RN

## 2024-04-08 NOTE — Progress Notes (Signed)
   She reported moderate improvement with Botox  injection as migraine prevention.  No longer use Maxalt , use Imitrex  as needed, along with Zofran .  Also complains of worsening depression, anxiety, failed multiple treatment in the past, is seeing psychotherapy,   Botox  injection for chronic migraine prevention, injection was performed according to [place] protocol,  5 units of Botox  was injected into each side, for 31 injection sites, total of 155 units  Bilateral frontalis 4 injection sites Bilateral corrugate 2 injection sites Procerus 1 injection sites. Bilateral temporalis 8 injection sites Bilateral occipitalis 6 injection sites Bilateral cervical paraspinals 4 injection sites Bilateral upper trapezius 6 injection sites  Extra 45 unites were injected into bilateral levator scapular and cervical paraspinal and masseter muscles  She  would like to Amovig 70mg  SQ monthly as preventive medications

## 2024-04-17 ENCOUNTER — Other Ambulatory Visit: Payer: Self-pay

## 2024-04-17 ENCOUNTER — Emergency Department (HOSPITAL_BASED_OUTPATIENT_CLINIC_OR_DEPARTMENT_OTHER)
Admission: EM | Admit: 2024-04-17 | Discharge: 2024-04-17 | Disposition: A | Discharge status: Home / Self Care | Attending: Emergency Medicine | Admitting: Emergency Medicine

## 2024-04-17 DIAGNOSIS — Z91040 Latex allergy status: Secondary | ICD-10-CM | POA: Diagnosis not present

## 2024-04-17 DIAGNOSIS — Z4802 Encounter for removal of sutures: Secondary | ICD-10-CM | POA: Diagnosis present

## 2024-04-17 DIAGNOSIS — Z7982 Long term (current) use of aspirin: Secondary | ICD-10-CM | POA: Diagnosis not present

## 2024-04-17 NOTE — ED Triage Notes (Signed)
 Pt had sutures placed 6/14 in R forearm and has returned for suture removal. No acute complaints.

## 2024-04-17 NOTE — Discharge Instructions (Signed)
 You were seen in the emergency department today for concerns of suture removal.  These were removed without any difficulty.  Please continue to monitor the wound for any signs of infection or if any signs of wound opening develop, return the emergency department or seek medical evaluation.

## 2024-04-17 NOTE — ED Provider Notes (Signed)
 [place] EMERGENCY DEPARTMENT AT [facility] Provider Note   CSN: [id number] Arrival date & time: 04/17/24  1324     Patient presents with: Suture / Staple Removal   [name] [name] is a 48 y.o. female.  Patient presents to the emergency department today for suture removal.  Had sutures placed on the right forearm on 04/04/2024.  No acute concerns from the sutures.  Reports no concerns for wound infection as there has been no erythema, drainage, swelling, or any systemic symptoms such as fevers.   Suture / Staple Removal       Prior to Admission medications   Medication Sig Start Date End Date Taking? Authorizing Provider  amphetamine -dextroamphetamine  (ADDERALL XR) 20 MG 24 hr capsule Take 20 mg by mouth daily as needed (adhd). Patient not taking: Reported on 12/18/2023    [provider]  amphetamine -dextroamphetamine  (ADDERALL) 20 MG tablet Take 20 mg by mouth 2 (two) times daily as needed (adhd). 05/25/22   [provider]  aspirin  EC 325 MG tablet Take 1 tablet (325 mg total) by mouth daily. Patient not taking: Reported on 12/18/2023 09/17/23   Genelle Standing, MD  botulinum toxin Type A  (BOTOX ) 200 units injection Inject 155 units into the head and neck every 3 months. 02/27/23   Onita Duos, MD  butalbital -acetaminophen -caffeine  (FIORICET ) 50-325-40 MG tablet Take 1 tablet by mouth every 6 (six) hours as needed for headache. 09/26/23   Onita Duos, MD  Butalbital -APAP-Caffeine  50-300-40 MG CAPS TAKE 1 CAPSULE BY MOUTH DAILY AS NEEDED. DO NOT REFILL IN LESS THAN 30 DAYS 09/17/23   Gregg Lek, MD  CANNABIDIOL PO Take 2 tablets by mouth daily as needed (pain relief).    [provider]  cyclobenzaprine  (FLEXERIL ) 5 MG tablet Take 5 mg by mouth 3 (three) times daily as needed for muscle spasms. 08/31/23   [provider]  Erenumab -aooe (AIMOVIG ) 70 MG/ML SOAJ Inject 70 mg into the skin every 30 (thirty) days. 04/08/24   Onita Duos, MD  Ferrous  Sulfate (IRON PO) Take 1 tablet by mouth daily as needed (low iron).    [provider]  fluticasone (FLONASE) 50 MCG/ACT nasal spray Place 2 sprays into both nostrils daily as needed for allergies.    [provider]  lidocaine  (HM LIDOCAINE  PATCH) 4 % Place 1 patch onto the skin daily. 5% lidocaine  patch 02/20/23   Corey, Evan S, MD  oxyCODONE  (ROXICODONE ) 5 MG immediate release tablet Take 1 tablet (5 mg total) by mouth every 4 (four) hours as needed for severe pain (pain score 7-10) or breakthrough pain. 09/17/23   Genelle Standing, MD  progesterone  (PROMETRIUM ) 100 MG capsule Take 100 mg by mouth daily as needed (hormone).    [provider]  scopolamine  (TRANSDERM-SCOP) 1 MG/3DAYS Place 1 patch (1.5 mg total) onto the skin every 3 (three) days. 10/24/22   Joane Artist RAMAN, MD  SUMAtriptan  (IMITREX ) 100 MG tablet May repeat in 2 hours if headache persists or recurs. 04/08/24   Onita Duos, MD    Allergies: Latex, Acetaminophen , Influenza vaccines, Metoprolol , Venlafaxine , and Verapamil     Review of Systems  Skin:  Positive for wound.  All other systems reviewed and are negative.   Updated Vital Signs BP (!) 140/118 (BP Location: Right Arm)   Pulse 100   Temp 98.7 F (37.1 C)   Resp 18   SpO2 98%   Physical Exam Vitals and nursing note reviewed.  Constitutional:      General: She  is not in acute distress.    Appearance: She is well-developed.  HENT:     Head: Normocephalic and atraumatic.   Eyes:     Conjunctiva/sclera: Conjunctivae normal.    Cardiovascular:     Rate and Rhythm: Normal rate and regular rhythm.     Heart sounds: No murmur heard. Pulmonary:     Effort: Pulmonary effort is normal. No respiratory distress.     Breath sounds: Normal breath sounds.  Abdominal:     Palpations: Abdomen is soft.     Tenderness: There is no abdominal tenderness.   Musculoskeletal:        General: No swelling.     Cervical back: Neck supple.   Skin:     General: Skin is warm and dry.     Capillary Refill: Capillary refill takes less than 2 seconds.     Findings: Lesion present.     Comments: Well-healing lesion to the right forearm with no obvious signs of infection.  No evidence of wound separation.  3 sutures remain intact.   Neurological:     Mental Status: She is alert.   Psychiatric:        Mood and Affect: Mood normal.     (all labs ordered are listed, but only abnormal results are displayed) Labs Reviewed - No data to display  EKG: None  Radiology: No results found.   Suture Removal  Date/Time: 04/17/2024 2:21 PM  Performed by: Lenay Lovejoy A, PA-C Authorized by: Tashona Calk A, PA-C   Consent:    Consent obtained:  Verbal   Consent given by:  Patient   Risks, benefits, and alternatives were discussed: yes     Risks discussed:  Pain, wound separation and bleeding   Alternatives discussed:  No treatment and delayed treatment Universal protocol:    Patient identity confirmed:  Verbally with patient and arm band Location:    Location:  Upper extremity   Upper extremity location:  Arm   Arm location:  R upper arm Procedure details:    Wound appearance:  No signs of infection, clean and good wound healing   Number of sutures removed:  3 Post-procedure details:    Post-removal:  No dressing applied   Procedure completion:  Tolerated    Medications Ordered in the ED - No data to display                                  Medical Decision Making  This patient presents to the ED for concern of wound evaluation.  Differential diagnosis includes suture removal, wound dehiscence, cellulitis, abscess   Problem List / ED Course:  Patient presents the emergency department for suture removal.  Patient states that she had sutures placed about 2 weeks ago and has had no issues with healing.  Denies any concerns for infection such as erythema, drainage, duration, or systemic signs such as fever. On exam, patient has  a well-healed laceration present to the right forearm.  3 sutures remain in place.  Obtained verbal consent from patient for removal and patient approves. Patient tolerated move without difficulty.  No bleeding or any other evidence of wound dehiscence present at this time.  Advised continued care for the wound to avoid reinjury or wound dehiscence.  Otherwise stable at this time for outpatient follow-up and discharged home.  Final diagnoses:  Visit for suture removal    ED Discharge Orders  None          Lakea Mittelman A, PA-C 04/17/24 1424    Kingsley, Victoria K, DO 04/17/24 1530

## 2024-05-27 ENCOUNTER — Telehealth: Payer: Self-pay | Admitting: Neurology

## 2024-05-27 DIAGNOSIS — G43709 Chronic migraine without aura, not intractable, without status migrainosus: Secondary | ICD-10-CM

## 2024-05-27 NOTE — Telephone Encounter (Signed)
 Submitted auth request via [employer] portal, received approval. Pt will continue to be B/B.  Auth#: [id number] (05/27/24-05/27/25)

## 2024-06-03 ENCOUNTER — Ambulatory Visit: Admitting: Neurology

## 2024-07-01 ENCOUNTER — Ambulatory Visit: Admitting: Neurology

## 2024-07-06 ENCOUNTER — Telehealth: Payer: Self-pay | Admitting: Neurology

## 2024-07-06 NOTE — Telephone Encounter (Signed)
 Patient called to check for an earlier appointment.

## 2024-07-07 ENCOUNTER — Encounter: Payer: Self-pay | Admitting: Neurology

## 2024-07-08 ENCOUNTER — Ambulatory Visit: Admitting: Neurology

## 2024-07-22 ENCOUNTER — Ambulatory Visit: Admitting: Neurology

## 2024-07-22 VITALS — BP 130/78 | Ht 66.0 in | Wt 165.0 lb

## 2024-07-22 DIAGNOSIS — G43711 Chronic migraine without aura, intractable, with status migrainosus: Secondary | ICD-10-CM

## 2024-07-22 DIAGNOSIS — G43709 Chronic migraine without aura, not intractable, without status migrainosus: Secondary | ICD-10-CM | POA: Diagnosis not present

## 2024-07-22 DIAGNOSIS — G44209 Tension-type headache, unspecified, not intractable: Secondary | ICD-10-CM

## 2024-07-22 MED ORDER — ONABOTULINUMTOXINA 200 UNITS IJ SOLR: 200.0000 [IU] | Freq: Once | INTRAMUSCULAR | Status: AC

## 2024-07-22 NOTE — Progress Notes (Unsigned)
   She reported moderate improvement with Botox  injection as migraine prevention.  No longer use Maxalt , use Imitrex  as needed, along with Zofran .  Also complains of worsening depression, anxiety, failed multiple treatment in the past, is seeing psychotherapy,   Botox  injection for chronic migraine prevention, injection was performed according to [place] protocol,  5 units of Botox  was injected into each side, for 31 injection sites, total of 155 units  Bilateral frontalis 4 injection sites Bilateral corrugate 2 injection sites Procerus 1 injection sites. Bilateral temporalis 8 injection sites Bilateral occipitalis 6 injection sites Bilateral cervical paraspinals 4 injection sites Bilateral upper trapezius 6 injection sites  Extra 45 unites were injected into bilateral levator scapular and cervical paraspinal and masseter muscles  She  would like to Amovig 70mg  SQ monthly as preventive medications

## 2024-07-22 NOTE — Progress Notes (Unsigned)
 Botox - 200 units x 1 vial Lot: [id number] Expiration: 2027/04 NDC: 0023-3921-02  Bacteriostatic 0.9% Sodium Chloride - 4 mL  Lot: [id number] Expiration: 08/21/25 NDC: 9590803397  Dx: H56.290 .G44.209, G43.711  S/P  Witnessed by DELENA MOLT RN

## 2024-08-24 ENCOUNTER — Encounter: Payer: Self-pay | Admitting: Radiology

## 2024-09-15 NOTE — Telephone Encounter (Signed)
 Submitted auth request to transition pt to SP, status is pending. Key: [id number]

## 2024-09-21 MED ORDER — ONABOTULINUMTOXINA 200 UNITS IJ SOLR: INTRAMUSCULAR | 2 refills | Status: AC

## 2024-09-21 NOTE — Telephone Encounter (Signed)
 Rx sent.

## 2024-09-21 NOTE — Addendum Note (Signed)
 Addended by: JOSHUA MAURILIO CROME on: 09/21/2024 10:27 AM   Modules accepted: Orders

## 2024-09-21 NOTE — Telephone Encounter (Signed)
 Auth was approved, please send rx to [facility].  Auth#: [id number] (09/15/24-10/21/25)

## 2024-09-23 ENCOUNTER — Ambulatory Visit: Admitting: Neurology

## 2024-10-05 NOTE — Telephone Encounter (Signed)
 LVM and sent MyChart msg asking pt to call in consent to [facility].

## 2024-10-08 NOTE — Telephone Encounter (Signed)
 Called and spoke with patient, she will call in consent to [employer].

## 2024-10-19 ENCOUNTER — Encounter: Payer: Self-pay | Admitting: Neurology

## 2024-10-19 ENCOUNTER — Ambulatory Visit: Admitting: Neurology

## 2024-10-20 ENCOUNTER — Ambulatory Visit (INDEPENDENT_AMBULATORY_CARE_PROVIDER_SITE_OTHER): Admitting: Neurology

## 2024-10-20 VITALS — BP 121/79

## 2024-10-20 DIAGNOSIS — G43709 Chronic migraine without aura, not intractable, without status migrainosus: Secondary | ICD-10-CM

## 2024-10-20 MED ORDER — SUMATRIPTAN SUCCINATE 100 MG PO TABS: ORAL_TABLET | ORAL | 11 refills | Status: AC

## 2024-10-20 MED ORDER — PROPRANOLOL HCL 20 MG PO TABS: 20.0000 mg | ORAL_TABLET | Freq: Two times a day (BID) | ORAL | 11 refills | Status: AC

## 2024-10-20 MED ORDER — ONABOTULINUMTOXINA 200 UNITS IJ SOLR: 200.0000 [IU] | Freq: Once | INTRAMUSCULAR | Status: AC

## 2024-10-20 MED ORDER — BUTALBITAL-ASPIRIN-CAFFEINE 50-325-40 MG PO CAPS: ORAL_CAPSULE | ORAL | 5 refills | Status: AC

## 2024-10-20 NOTE — Progress Notes (Signed)
" ° °  She reported moderate improvement with Botox  injection as migraine prevention.  No longer use Maxalt , use Imitrex  as needed, along with Zofran .  Also complains of worsening depression, anxiety, failed multiple treatment in the past, is seeing psychotherapy,   Botox  injection for chronic migraine prevention, injection was performed according to [place] protocol,  5 units of Botox  was injected into each side, for 31 injection sites, total of 155 units  Bilateral frontalis 4 injection sites Bilateral corrugate 2 injection sites Procerus 1 injection sites. Bilateral temporalis 8 injection sites Bilateral occipitalis 6 injection sites Bilateral cervical paraspinals 4 injection sites Bilateral upper trapezius 6 injection sites  Extra 45 unites were injected into bilateral levator scapular, cervical paraspinal and masseter muscles.  Meds ordered this encounter  Medications   butalbital -aspirin -caffeine  (FIORINAL) 50-325-40 MG capsule    Sig: As needed for migraine    Dispense:  12 capsule    Refill:  5    Do not refill in less than 30 days   propranolol (INDERAL) 20 MG tablet    Sig: Take 1 tablet (20 mg total) by mouth 2 (two) times daily.    Dispense:  60 tablet    Refill:  11   SUMAtriptan  (IMITREX ) 100 MG tablet    Sig: May repeat in 2 hours if headache persists or recurs.    Dispense:  12 tablet    Refill:  11    Do not refill in less than 30 days         "

## 2024-10-20 NOTE — Progress Notes (Signed)
 Botox - 200 units x 1 vial Lot: [id number] Expiration: 12/2026 NDC: 9976-6078-97  Bacteriostatic 0.9% Sodium Chloride - 4 mL  Lot: [id number] Expiration: MAR-31-2027 NDC: 9590-8033-97  Dx: G43.709 S/P  Witnessed by:Buddy Ulla LATHER

## 2025-01-13 ENCOUNTER — Ambulatory Visit: Admitting: Neurology
